# Patient Record
Sex: Female | Born: 1980 | Hispanic: Yes | Marital: Married | Smoking: Former smoker
Health system: Southern US, Community
[De-identification: ages and names within clinical notes are randomized; demographics above are authoritative.]

## PROBLEM LIST (undated history)

## (undated) ENCOUNTER — Inpatient Hospital Stay (HOSPITAL_COMMUNITY): Payer: Self-pay

## (undated) DIAGNOSIS — K649 Unspecified hemorrhoids: Secondary | ICD-10-CM

## (undated) DIAGNOSIS — Z960 Presence of urogenital implants: Secondary | ICD-10-CM

## (undated) DIAGNOSIS — I8390 Asymptomatic varicose veins of unspecified lower extremity: Secondary | ICD-10-CM

## (undated) DIAGNOSIS — F419 Anxiety disorder, unspecified: Secondary | ICD-10-CM

## (undated) DIAGNOSIS — N12 Tubulo-interstitial nephritis, not specified as acute or chronic: Secondary | ICD-10-CM

## (undated) DIAGNOSIS — O139 Gestational [pregnancy-induced] hypertension without significant proteinuria, unspecified trimester: Secondary | ICD-10-CM

## (undated) HISTORY — PX: NO PAST SURGERIES: SHX2092

## (undated) HISTORY — DX: Asymptomatic varicose veins of unspecified lower extremity: I83.90

---

## 2002-05-26 ENCOUNTER — Emergency Department (HOSPITAL_COMMUNITY): Admission: EM | Admit: 2002-05-26 | Discharge: 2002-05-27 | Payer: Self-pay | Admitting: Emergency Medicine

## 2003-08-01 ENCOUNTER — Emergency Department (HOSPITAL_COMMUNITY): Admission: EM | Admit: 2003-08-01 | Discharge: 2003-08-01 | Payer: Self-pay

## 2003-12-27 ENCOUNTER — Emergency Department (HOSPITAL_COMMUNITY): Admission: EM | Admit: 2003-12-27 | Discharge: 2003-12-28 | Payer: Self-pay | Admitting: Emergency Medicine

## 2004-09-30 ENCOUNTER — Emergency Department (HOSPITAL_COMMUNITY): Admission: EM | Admit: 2004-09-30 | Discharge: 2004-09-30 | Payer: Self-pay | Admitting: Emergency Medicine

## 2004-10-10 ENCOUNTER — Emergency Department (HOSPITAL_COMMUNITY): Admission: EM | Admit: 2004-10-10 | Discharge: 2004-10-11 | Payer: Self-pay | Admitting: Emergency Medicine

## 2006-01-04 ENCOUNTER — Inpatient Hospital Stay (HOSPITAL_COMMUNITY): Admission: AD | Admit: 2006-01-04 | Discharge: 2006-01-04 | Payer: Self-pay | Admitting: *Deleted

## 2006-02-07 ENCOUNTER — Emergency Department (HOSPITAL_COMMUNITY): Admission: EM | Admit: 2006-02-07 | Discharge: 2006-02-08 | Payer: Self-pay | Admitting: *Deleted

## 2006-02-26 ENCOUNTER — Inpatient Hospital Stay (HOSPITAL_COMMUNITY): Admission: AD | Admit: 2006-02-26 | Discharge: 2006-02-26 | Payer: Self-pay | Admitting: Gynecology

## 2006-03-25 ENCOUNTER — Inpatient Hospital Stay (HOSPITAL_COMMUNITY): Admission: AD | Admit: 2006-03-25 | Discharge: 2006-03-25 | Payer: Self-pay | Admitting: Gynecology

## 2006-05-06 ENCOUNTER — Inpatient Hospital Stay (HOSPITAL_COMMUNITY): Admission: AD | Admit: 2006-05-06 | Discharge: 2006-05-06 | Payer: Self-pay | Admitting: Gynecology

## 2006-05-23 ENCOUNTER — Inpatient Hospital Stay (HOSPITAL_COMMUNITY): Admission: AD | Admit: 2006-05-23 | Discharge: 2006-05-25 | Payer: Self-pay | Admitting: Obstetrics

## 2006-06-16 ENCOUNTER — Inpatient Hospital Stay (HOSPITAL_COMMUNITY): Admission: AD | Admit: 2006-06-16 | Discharge: 2006-06-16 | Payer: Self-pay | Admitting: Obstetrics

## 2006-07-03 ENCOUNTER — Inpatient Hospital Stay (HOSPITAL_COMMUNITY): Admission: AD | Admit: 2006-07-03 | Discharge: 2006-07-07 | Payer: Self-pay | Admitting: Obstetrics

## 2006-07-04 ENCOUNTER — Encounter (INDEPENDENT_AMBULATORY_CARE_PROVIDER_SITE_OTHER): Payer: Self-pay | Admitting: *Deleted

## 2008-08-20 ENCOUNTER — Inpatient Hospital Stay (HOSPITAL_COMMUNITY): Admission: AD | Admit: 2008-08-20 | Discharge: 2008-08-20 | Payer: Self-pay | Admitting: Obstetrics & Gynecology

## 2008-09-17 ENCOUNTER — Inpatient Hospital Stay (HOSPITAL_COMMUNITY): Admission: AD | Admit: 2008-09-17 | Discharge: 2008-09-17 | Payer: Self-pay | Admitting: Obstetrics & Gynecology

## 2009-08-22 ENCOUNTER — Emergency Department (HOSPITAL_COMMUNITY): Admission: EM | Admit: 2009-08-22 | Discharge: 2009-08-22 | Payer: Self-pay | Admitting: Emergency Medicine

## 2009-08-23 ENCOUNTER — Emergency Department (HOSPITAL_COMMUNITY): Admission: EM | Admit: 2009-08-23 | Discharge: 2009-08-23 | Payer: Self-pay | Admitting: Emergency Medicine

## 2009-08-28 ENCOUNTER — Emergency Department (HOSPITAL_COMMUNITY): Admission: EM | Admit: 2009-08-28 | Discharge: 2009-08-28 | Payer: Self-pay | Admitting: Emergency Medicine

## 2010-01-21 ENCOUNTER — Emergency Department (HOSPITAL_COMMUNITY): Admission: EM | Admit: 2010-01-21 | Discharge: 2010-01-21 | Payer: Self-pay | Admitting: Emergency Medicine

## 2010-01-28 ENCOUNTER — Emergency Department (HOSPITAL_COMMUNITY): Admission: EM | Admit: 2010-01-28 | Discharge: 2010-01-28 | Payer: Self-pay | Admitting: Emergency Medicine

## 2011-03-02 LAB — URINALYSIS, ROUTINE W REFLEX MICROSCOPIC
Glucose, UA: NEGATIVE mg/dL
Ketones, ur: 40 mg/dL — AB
Nitrite: POSITIVE — AB
Protein, ur: 30 mg/dL — AB
Specific Gravity, Urine: 1.029 (ref 1.005–1.030)
Urobilinogen, UA: 2 mg/dL — ABNORMAL HIGH (ref 0.0–1.0)
pH: 5.5 (ref 5.0–8.0)

## 2011-03-02 LAB — DIFFERENTIAL
Basophils Absolute: 0 10*3/uL (ref 0.0–0.1)
Basophils Relative: 0 % (ref 0–1)
Eosinophils Absolute: 0 10*3/uL (ref 0.0–0.7)
Eosinophils Relative: 0 % (ref 0–5)
Lymphocytes Relative: 3 % — ABNORMAL LOW (ref 12–46)
Lymphs Abs: 0.3 10*3/uL — ABNORMAL LOW (ref 0.7–4.0)
Monocytes Absolute: 0.1 10*3/uL (ref 0.1–1.0)
Monocytes Relative: 1 % — ABNORMAL LOW (ref 3–12)
Neutro Abs: 9.5 10*3/uL — ABNORMAL HIGH (ref 1.7–7.7)
Neutrophils Relative %: 96 % — ABNORMAL HIGH (ref 43–77)

## 2011-03-02 LAB — CBC
HCT: 35.6 % — ABNORMAL LOW (ref 36.0–46.0)
Hemoglobin: 12.3 g/dL (ref 12.0–15.0)
MCHC: 34.4 g/dL (ref 30.0–36.0)
MCV: 92.4 fL (ref 78.0–100.0)
Platelets: 244 10*3/uL (ref 150–400)
RBC: 3.85 MIL/uL — ABNORMAL LOW (ref 3.87–5.11)
RDW: 14.6 % (ref 11.5–15.5)
WBC: 9.9 10*3/uL (ref 4.0–10.5)

## 2011-03-02 LAB — BASIC METABOLIC PANEL
BUN: 13 mg/dL (ref 6–23)
CO2: 24 mEq/L (ref 19–32)
Calcium: 8.4 mg/dL (ref 8.4–10.5)
Chloride: 100 mEq/L (ref 96–112)
Creatinine, Ser: 0.82 mg/dL (ref 0.4–1.2)
GFR calc Af Amer: >60 mL/min (ref >60)
GFR calc non Af Amer: >60 mL/min (ref >60)
Glucose, Bld: 112 mg/dL — ABNORMAL HIGH (ref 70–99)
Potassium: 3.1 mEq/L — ABNORMAL LOW (ref 3.5–5.1)
Sodium: 136 mEq/L (ref 135–145)

## 2011-03-02 LAB — URINE MICROSCOPIC-ADD ON

## 2011-03-02 LAB — POCT I-STAT, CHEM 8
BUN: 14 mg/dL (ref 6–23)
Calcium, Ion: 1.08 mmol/L — ABNORMAL LOW (ref 1.12–1.32)
Chloride: 102 mEq/L (ref 96–112)
Creatinine, Ser: 0.7 mg/dL (ref 0.4–1.2)
Glucose, Bld: 115 mg/dL — ABNORMAL HIGH (ref 70–99)
HCT: 37 % (ref 36.0–46.0)
Hemoglobin: 12.6 g/dL (ref 12.0–15.0)
Potassium: 3.2 mEq/L — ABNORMAL LOW (ref 3.5–5.1)
Sodium: 135 mEq/L (ref 135–145)
TCO2: 26 mmol/L (ref 0–100)

## 2011-03-02 LAB — URINE CULTURE
Colony Count: NO GROWTH
Culture: NO GROWTH

## 2011-03-02 LAB — POCT PREGNANCY, URINE: Preg Test, Ur: NEGATIVE

## 2011-03-18 LAB — POCT I-STAT, CHEM 8
BUN: 6 mg/dL (ref 6–23)
Calcium, Ion: 1.14 mmol/L (ref 1.12–1.32)
Chloride: 104 mEq/L (ref 96–112)
Creatinine, Ser: 0.6 mg/dL (ref 0.4–1.2)
Glucose, Bld: 100 mg/dL — ABNORMAL HIGH (ref 70–99)
HCT: 39 % (ref 36.0–46.0)
Hemoglobin: 13.3 g/dL (ref 12.0–15.0)
Potassium: 3.4 mEq/L — ABNORMAL LOW (ref 3.5–5.1)
Sodium: 140 mEq/L (ref 135–145)
TCO2: 24 mmol/L (ref 0–100)

## 2011-09-12 LAB — POCT PREGNANCY, URINE: Preg Test, Ur: NEGATIVE

## 2011-09-14 LAB — URINALYSIS, ROUTINE W REFLEX MICROSCOPIC
Leukocytes, UA: NEGATIVE
Nitrite: NEGATIVE
Specific Gravity, Urine: <1.005 — ABNORMAL LOW
Urobilinogen, UA: 0.2

## 2011-09-14 LAB — URINE MICROSCOPIC-ADD ON

## 2011-09-14 LAB — CBC
HCT: 35.9 — ABNORMAL LOW
Hemoglobin: 11.9 — ABNORMAL LOW
MCV: 90.7
RBC: 3.96
WBC: 7.6

## 2011-09-14 LAB — WET PREP, GENITAL: Clue Cells Wet Prep HPF POC: NONE SEEN

## 2011-09-14 LAB — POCT PREGNANCY, URINE: Preg Test, Ur: NEGATIVE

## 2011-12-13 NOTE — L&D Delivery Note (Signed)
Delivery Note At 4:57 PM a viable female was delivered via Vaginal, Spontaneous Delivery (Presentation: Right Occiput Anterior).  APGAR: 9, 9; weight .   Placenta status: Intact, Spontaneous.  Cord: 3 vessels with the following complications: None.  Cord pH: none  Anesthesia: None  Episiotomy: None Lacerations: None Suture Repair: none Est. Blood Loss (mL): 250  Mom to postpartum.  Baby to nursery-stable.  HARPER,CHARLES A 08/17/2012, 5:12 PM

## 2012-01-07 ENCOUNTER — Inpatient Hospital Stay (HOSPITAL_COMMUNITY)
Admission: AD | Admit: 2012-01-07 | Discharge: 2012-01-07 | Disposition: A | Discharge status: Home / Self Care | Payer: Self-pay | Source: Ambulatory Visit | Attending: Obstetrics & Gynecology | Admitting: Obstetrics & Gynecology

## 2012-01-07 ENCOUNTER — Encounter (HOSPITAL_COMMUNITY): Payer: Self-pay

## 2012-01-07 ENCOUNTER — Inpatient Hospital Stay (HOSPITAL_COMMUNITY): Payer: Self-pay

## 2012-01-07 DIAGNOSIS — A499 Bacterial infection, unspecified: Secondary | ICD-10-CM | POA: Insufficient documentation

## 2012-01-07 DIAGNOSIS — N760 Acute vaginitis: Secondary | ICD-10-CM | POA: Insufficient documentation

## 2012-01-07 DIAGNOSIS — R109 Unspecified abdominal pain: Secondary | ICD-10-CM | POA: Insufficient documentation

## 2012-01-07 DIAGNOSIS — B9689 Other specified bacterial agents as the cause of diseases classified elsewhere: Secondary | ICD-10-CM | POA: Insufficient documentation

## 2012-01-07 DIAGNOSIS — O2390 Unspecified genitourinary tract infection in pregnancy, unspecified trimester: Secondary | ICD-10-CM | POA: Insufficient documentation

## 2012-01-07 LAB — CBC
HCT: 35.4 % — ABNORMAL LOW (ref 36.0–46.0)
Hemoglobin: 12.3 g/dL (ref 12.0–15.0)
MCH: 30.5 pg (ref 26.0–34.0)
MCHC: 34.7 g/dL (ref 30.0–36.0)
MCV: 87.8 fL (ref 78.0–100.0)
Platelets: 293 K/uL (ref 150–400)
RBC: 4.03 MIL/uL (ref 3.87–5.11)
RDW: 12.7 % (ref 11.5–15.5)
WBC: 8.5 K/uL (ref 4.0–10.5)

## 2012-01-07 LAB — URINALYSIS, ROUTINE W REFLEX MICROSCOPIC
Bilirubin Urine: NEGATIVE
Glucose, UA: NEGATIVE mg/dL
Ketones, ur: NEGATIVE mg/dL
Specific Gravity, Urine: <1.005 — ABNORMAL LOW (ref 1.005–1.030)
pH: 5.5 (ref 5.0–8.0)

## 2012-01-07 LAB — WET PREP, GENITAL
Trich, Wet Prep: NONE SEEN
Yeast Wet Prep HPF POC: NONE SEEN

## 2012-01-07 LAB — URINE MICROSCOPIC-ADD ON

## 2012-01-07 LAB — ABO/RH: ABO/RH(D): O POS

## 2012-01-07 MED ORDER — METRONIDAZOLE 500 MG PO TABS: 500.0000 mg | ORAL_TABLET | Freq: Three times a day (TID) | ORAL | Status: AC

## 2012-01-07 MED ORDER — PROMETHAZINE HCL 12.5 MG PO TABS: 12.5000 mg | ORAL_TABLET | Freq: Four times a day (QID) | ORAL | Status: DC | PRN

## 2012-01-07 NOTE — ED Provider Notes (Signed)
History    G2P1 presents at [redacted]w[redacted]d by LMP for vaginal spotting, cramping, and n/v x1 day. She does not yet have a provider for prenatal care.  Chief Complaint  Patient presents with  . Vaginal Bleeding  . Abdominal Cramping  . Emesis   HPI  OB History    Grav Para Term Preterm Abortions TAB SAB Ect Mult Living   2 1 1       1       History reviewed. No pertinent past medical history.  History reviewed. No pertinent past surgical history.  History reviewed. No pertinent family history.  History  Substance Use Topics  . Smoking status: Never Smoker   . Smokeless tobacco: Not on file  . Alcohol Use: No    Allergies:  Allergies  Allergen Reactions  . Iohexol      Code: SOB, Desc: IMMEDIATELY SOB FOLLOWING IV INJECTION, SPOTTY HIVES, PT GIVEN BENADRYL AND EPI-ARS 08/22/09, Onset Date: [id number]     Prescriptions prior to admission  Medication Sig Dispense Refill  . Prenatal Vit-Fe Fumarate-FA (PRENATAL MULTIVITAMIN) TABS Take 1 tablet by mouth daily.        Review of Systems  Gastrointestinal: Positive for nausea and abdominal pain.   Physical Exam   Blood pressure 126/71, pulse 77, temperature 97.5 F (36.4 C), temperature source Oral, resp. rate 18, height 5\' 4"  (1.626 m), weight 54.205 kg (119 lb 8 oz), last menstrual period 12/02/2011, SpO2 99.00%.  Physical Exam  Constitutional: She is oriented to person, place, and time.  GI: Soft.  Genitourinary:       Cervix pink with redness noted at cervical os.  No active bleeding noted.  Scant white vaginal discharge.  Cervix 0/lng/hi  Neurological: She is alert and oriented to person, place, and time.  Skin: Skin is warm and dry.  Psychiatric: She has a normal mood and affect. Her behavior is normal. Judgment and thought content normal.   Results for orders placed during the hospital encounter of 01/07/12 (from the past 24 hour(s))  URINALYSIS, ROUTINE W REFLEX MICROSCOPIC     Status: Abnormal   Collection Time   01/07/12  8:30 PM      Component Value Range   Color, Urine YELLOW  YELLOW    APPearance CLEAR  CLEAR    Specific Gravity, Urine <1.005 (*) 1.005 - 1.030    pH 5.5  5.0 - 8.0    Glucose, UA NEGATIVE  NEGATIVE (mg/dL)   Hgb urine dipstick TRACE (*) NEGATIVE    Bilirubin Urine NEGATIVE  NEGATIVE    Ketones, ur NEGATIVE  NEGATIVE (mg/dL)   Protein, ur NEGATIVE  NEGATIVE (mg/dL)   Urobilinogen, UA 0.2  0.0 - 1.0 (mg/dL)   Nitrite NEGATIVE  NEGATIVE    Leukocytes, UA NEGATIVE  NEGATIVE   URINE MICROSCOPIC-ADD ON     Status: Normal   Collection Time   01/07/12  8:30 PM      Component Value Range   Squamous Epithelial / LPF RARE  RARE    WBC, UA 0-2  <3 (WBC/hpf)   RBC / HPF 0-2  <3 (RBC/hpf)  WET PREP, GENITAL     Status: Abnormal   Collection Time   01/07/12  8:50 PM      Component Value Range   Yeast, Wet Prep NONE SEEN  NONE SEEN    Trich, Wet Prep NONE SEEN  NONE SEEN    Clue Cells, Wet Prep MODERATE (*) NONE SEEN    WBC, Wet  Prep HPF POC MODERATE (*) NONE SEEN   POCT PREGNANCY, URINE     Status: Abnormal   Collection Time   01/07/12  8:56 PM      Component Value Range   Preg Test, Ur POSITIVE (*) NEGATIVE   HCG, QUANTITATIVE, PREGNANCY     Status: Abnormal   Collection Time   01/07/12  9:52 PM      Component Value Range   hCG, Beta Chain, Quant, S 8590 (*) <5 (mIU/mL)  CBC     Status: Abnormal   Collection Time   01/07/12  9:55 PM      Component Value Range   WBC 8.5  4.0 - 10.5 (K/uL)   RBC 4.03  3.87 - 5.11 (MIL/uL)   Hemoglobin 12.3  12.0 - 15.0 (g/dL)   HCT 16.1 (*) 09.6 - 46.0 (%)   MCV 87.8  78.0 - 100.0 (fL)   MCH 30.5  26.0 - 34.0 (pg)   MCHC 34.7  30.0 - 36.0 (g/dL)   RDW 04.5  40.9 - 81.1 (%)   Platelets 293  150 - 400 (K/uL)  ABO/RH     Status: Normal   Collection Time   01/07/12  9:56 PM      Component Value Range   ABO/RH(D) O POS     US Ob Comp Less 14 Wks Results indicate IUGS at 5.5 weeks, which is consistent with LMP.  No yolk sac or fetal pole  visualized.    MAU Course  Procedures U/A U/S  Quantitative hCG CBC ABO/Rh   Assessment and Plan  A: Bacterial vaginosis Early IUP  P: D/C home with bleeding precautions Prescription for Flagyl BID for 7 days Return to MAU for [facility] in 48 hours List of providers given for pt to set up f/u prenatal care  LEFTWICH-KIRBY, LISA 01/07/2012, 9:40 PM

## 2012-01-07 NOTE — Progress Notes (Signed)
Patient is here with c/o vaginal bleeding, llq cramping and vomiting that started today. The pad she came in with had a brown streak in the middle of pad. She states that she had a positive home pregnancy test 4 days ago.

## 2012-01-12 NOTE — ED Provider Notes (Signed)
Attestation of Attending Supervision of Advanced Practitioner: Evaluation and management procedures were performed by the PA/NP/CNM/OB Fellow under my supervision/collaboration. Chart reviewed, and agree with management and plan.  Jaynie Collins, M.D. 01/12/2012 9:02 AM

## 2012-01-28 ENCOUNTER — Inpatient Hospital Stay (HOSPITAL_COMMUNITY)
Admission: AD | Admit: 2012-01-28 | Discharge: 2012-01-29 | Disposition: A | Discharge status: Home / Self Care | Payer: Self-pay | Source: Ambulatory Visit | Attending: Obstetrics & Gynecology | Admitting: Obstetrics & Gynecology

## 2012-01-28 DIAGNOSIS — O200 Threatened abortion: Secondary | ICD-10-CM | POA: Insufficient documentation

## 2012-01-28 LAB — URINALYSIS, ROUTINE W REFLEX MICROSCOPIC
Glucose, UA: NEGATIVE mg/dL
Ketones, ur: NEGATIVE mg/dL
Leukocytes, UA: NEGATIVE
Nitrite: NEGATIVE
Specific Gravity, Urine: >1.03 — ABNORMAL HIGH (ref 1.005–1.030)
pH: 5.5 (ref 5.0–8.0)

## 2012-01-28 LAB — URINE MICROSCOPIC-ADD ON

## 2012-01-28 NOTE — Progress Notes (Signed)
Pt states, " I started having chills today at 1:30 pm today and then at 8:30 pm I started having cramping in my low abdomen and it goes down my legs, and I started seeing drops of blood when I urinate and latter started seeing small clots."

## 2012-01-29 ENCOUNTER — Inpatient Hospital Stay (HOSPITAL_COMMUNITY): Payer: Self-pay

## 2012-01-29 ENCOUNTER — Encounter (HOSPITAL_COMMUNITY): Payer: Self-pay | Admitting: Obstetrics and Gynecology

## 2012-01-29 NOTE — ED Provider Notes (Signed)
History     Chief Complaint  Patient presents with  . Vaginal Bleeding  . Abdominal Pain  . Chills   HPI  "I noticed bleeding at about 2030 01/28/12. Then I started cramping about 2200. I feel weakness in my legs when I try to walk (x 1 hour)."  Previously seen in MAU on 01/07/12.  Wet prep - mod clue>treated; GC/CT neg x2; ultrasound > no FHR seen.  Blood type O+.   History reviewed. No pertinent past medical history.  History reviewed. No pertinent past surgical history.  History reviewed. No pertinent family history.  History  Substance Use Topics  . Smoking status: Never Smoker   . Smokeless tobacco: Not on file  . Alcohol Use: No    Allergies:  Allergies  Allergen Reactions  . Iohexol      Code: SOB, Desc: IMMEDIATELY SOB FOLLOWING IV INJECTION, SPOTTY HIVES, PT GIVEN BENADRYL AND EPI-ARS 08/22/09, Onset Date: [id number]     Prescriptions prior to admission  Medication Sig Dispense Refill  . Prenatal Vit-Fe Fumarate-FA (PRENATAL MULTIVITAMIN) TABS Take 1 tablet by mouth daily.        Review of Systems  Gastrointestinal: Positive for abdominal pain (cramping).  Genitourinary:       Vaginal bleeding  All other systems reviewed and are negative.   Physical Exam   Blood pressure 97/59, pulse 71, temperature 98 F (36.7 C), temperature source Oral, resp. rate 16, height 5\' 3"  (1.6 m), weight 54.545 kg (120 lb 4 oz), last menstrual period 12/02/2011.  Physical Exam  Constitutional: She is oriented to person, place, and time. She appears well-developed and well-nourished. No distress.  HENT:  Head: Normocephalic.  Neck: Normal range of motion. Neck supple.  Cardiovascular: Normal rate, regular rhythm and normal heart sounds.   Respiratory: Effort normal and breath sounds normal. No respiratory distress.  GI: Soft. She exhibits no mass. There is no tenderness. There is no rebound and no guarding.  Genitourinary: There is bleeding ( scant) around the vagina.    Neurological: She is alert and oriented to person, place, and time.  Skin: Skin is warm and dry.    MAU Course  Procedures  Results for orders placed during the hospital encounter of 01/28/12 (from the past 24 hour(s))  URINALYSIS, ROUTINE W REFLEX MICROSCOPIC     Status: Abnormal   Collection Time   01/28/12 11:39 PM      Component Value Range   Color, Urine YELLOW  YELLOW    APPearance CLEAR  CLEAR    Specific Gravity, Urine >1.030 (*) 1.005 - 1.030    pH 5.5  5.0 - 8.0    Glucose, UA NEGATIVE  NEGATIVE (mg/dL)   Hgb urine dipstick LARGE (*) NEGATIVE    Bilirubin Urine NEGATIVE  NEGATIVE    Ketones, ur NEGATIVE  NEGATIVE (mg/dL)   Protein, ur NEGATIVE  NEGATIVE (mg/dL)   Urobilinogen, UA 0.2  0.0 - 1.0 (mg/dL)   Nitrite NEGATIVE  NEGATIVE    Leukocytes, UA NEGATIVE  NEGATIVE   URINE MICROSCOPIC-ADD ON     Status: Abnormal   Collection Time   01/28/12 11:39 PM      Component Value Range   Squamous Epithelial / LPF FEW (*) RARE    WBC, UA 3-6  <3 (WBC/hpf)   RBC / HPF 7-10  <3 (RBC/hpf)   Bacteria, UA FEW (*) RARE     Ultrasound: IMPRESSION:  Single live intrauterine pregnancy, with a crown-rump length of 2.0  cm,  corresponding to a gestational age of [redacted] weeks 5 days. This  matches the gestational age of [redacted] weeks 2 days by LMP, reflecting an  estimated date of delivery of September 07, 2012.   Assessment and Plan  Threatened Miscarriage  Plan: DC to home Bleeding precautions given Keep appointment with Health Department for next week [facility]   [facility] 01/29/2012, 1:22 AM

## 2012-01-29 NOTE — Progress Notes (Signed)
"  I noticed bleeding at about 2030 01/28/12.  Then I started cramping about 2200.  I feel weakness in my legs when I try to walk (x 1 hour)."

## 2012-01-29 NOTE — ED Provider Notes (Signed)
Attestation of Attending Supervision of Advanced Practitioner: Evaluation and management procedures were performed by the PA/NP/CNM/OB Fellow under my supervision/collaboration. Chart reviewed, and agree with management and plan.  Jaynie Collins, M.D. 01/29/2012 7:09 AM

## 2012-01-29 NOTE — Discharge Instructions (Signed)
Amenaza de aborto (Threatened Miscarriage) La hemorragia en las primeras 20 semanas de embarazo es algo frecuente. Se denomina amenaza de aborto Es un problema del embarazo que ocurre antes de la vigsima semana y que sugiere la probabilidad de que ocurra un aborto espontneo. Generalmente esta hemorragia se detiene con reposo o con disminucin de las actividades, segn le ha sugerido el profesional que la [place], y Firefighter contina sin [employer]. Le indicarn que no [employer], no tenga orgasmos ni use tampones hasta que la autoricen. En algunos casos la amenaza de aborto progresar hasta el aborto completo o incompleto. En algunos casos puede ser necesario un tratamiento adicional, en otros casos no. Algunos abortos ocurren antes que la mujer note que no ha [employer] y de que sepa que est embarazada. Un aborto ocurre en el 15% al 20% de todos los embarazos y generalmente durante las primeras 13 semanas. En la [employer] se desconoce la causa exacta. Es Biochemist, clinical en que la naturaliza pone fin a un embarazo anormal o que no llegar a trmino. Algunas de las cosas que ponen en riesgo el embarazo son:  Los cambios hormonales.   Infeccin o tumores en el tero.   Enfermedades crnicas, por ejemplo la diabetes, especialmente si no se ha controlado.   Forma anormal del tero.   Fibromas en el tero   Crvix incompetente (es demasiado dbil para contener al beb)   El consumo de cigarrillos.   Beber alcohol en exceso. Lo mejor es abstenerse de beber alcohol durante el embarazo.   El consumo de drogas  TRATAMIENTO No es necesario Education officer, environmental un tratamiento adicional cuando el aborto es completo y todos los componentes de la concepcin (todos los tejidos) se han eliminado. Si ha eliminado tejidos, consrvelos en un recipiente y llvelos al mdico para que los evale. Si el aborto es incompleto (partes del feto o de la placenta [place]) ser necesario un  tratamiento adicional. La razn ms frecuente para Education officer, environmental un tratamiento es el sangrado (hemorragia) continuo debido a que los tejidos no se han eliminado completamente. Esto sucede cuando el aborto es incompleto. Tambin puede [employer] tejidos que no se han expulsado se infecten. El tratamiento consiste en la dilatacin y Scientific laboratory technician (remocin de los productos del embarazo que pudieran quedar en el tero). Se realizar simplemente por medio de la succin (curetaje por succin) o por un raspado simple del interior del tero. Podr llevarse a cabo en el hospital o en el consultorio del profesional. Slo se lleva a cabo cuando el profesional se asegura que no hay posibilidades de que el embarazo llegue a trmino. Esto se determina por medio del examen fsico, la prueba de Psychiatrist negativa, el recuento hormonal y el ultrasonido que confirme la muerte del feto. El aborto generalmente es una situacin emocionalmente difcil para los [place]. No es por su culpa ni la de su pareja. No ocurre por conductas inapropiadas por parte suya o de su compaero. Casi todos los abortos se producen porque el embarazo ha comenzado de un modo incorrecto. Al menos la mitad de estos embarazos presenta anormalidades cromosmicas. Casi nunca se trata de un trastorno congnito. En otros puede haber problemas de desarrollo en el feto o en la placenta. Esto no siempre se revela, an cuando se estudien los productos del aborto con el microscopio. No se sienta culpable y probablemente no podra haber evitado que esto ocurra. Si tiene [employer] debido a este problema, convrselo con Mining engineer  y solicite ayuda psicolgico antes de un nuevo embarazo. Casi siempre puede tratar de embarazarse nuevamente tan pronto el profesional la autorice. INSTRUCCIONES PARA EL CUIDADO DOMICILIARIO  El Economist reposo, segn la importancia de la hemorragia y los dolores que [place]. Probablemente slo la autorice a  levantarse para ir al bao. Tambin podr autorizarla a Building surveyor. En este momento usted Economist algunos arreglos para que otra persona se ocupe del cuidado de los nios y de otras responsabilidades adicionales.   Lleve un registro de la cantidad y la saturacin de las toallas higinicas que Landscape architect. Anote esta informacin.   NO USE TAMPONES. No se haga duchas vaginales ni tenga relaciones sexuales u orgasmos hasta que el mdico la autorice.   Puede ser que le indiquen una cita para un seguimiento en el que volvern a Development worker, community el [place] de su Psychiatrist y Chief Executive Officer repetirn las pruebas de [place]. Concurra para una nueva evaluacin dentro de 2 das y [place] de 4 a 6 semanas. Es muy importante que realice el seguimiento en el momento que le han indicado.   Si su grupo sanguneo es Rh negativo y el del padre es Rh positivo, o si no conoce el grupo sanguneo del padre, le indicarn una inyeccin (inmunoglobulina Rh) para prevenir los anticuerpos anormales que pueden desarrollarse y Audiological scientist al beb en futuros embarazos.  SOLICITE ATENCIN MDICA DE INMEDIATO SI:  Siente calambres intensos en el estmago, en la espalda o en el abdomen.   Siente un dolor intenso de comienzo sbito en la zona inferior del abdomen.   Comienza a sentir escalofros.   La temperatura se eleva por encima de 101 F (38.3 C).   Elimina cogulos o tejidos grandes. Guarde una muestra de esos tejidos para que el profesional lo inspeccione.   La hemorragia aumenta o se siente mareada, dbil o tiene episodios de desmayos.   Tiene una prdida de lquido por la vagina.   Se desmaya. No puede mover el intestino. Podra tratarse de un embarazo ectpico.  Document Released: 09/07/2005 Document Revised: 08/10/2011 [facility] Patient Information 2012 [place], [place].

## 2012-01-31 ENCOUNTER — Encounter (HOSPITAL_COMMUNITY): Payer: Self-pay | Admitting: *Deleted

## 2012-01-31 ENCOUNTER — Inpatient Hospital Stay (HOSPITAL_COMMUNITY)
Admission: AD | Admit: 2012-01-31 | Discharge: 2012-02-01 | Disposition: A | Discharge status: Home / Self Care | Payer: Self-pay | Source: Ambulatory Visit | Attending: Obstetrics & Gynecology | Admitting: Obstetrics & Gynecology

## 2012-01-31 ENCOUNTER — Inpatient Hospital Stay (HOSPITAL_COMMUNITY): Payer: Self-pay

## 2012-01-31 DIAGNOSIS — O99891 Other specified diseases and conditions complicating pregnancy: Secondary | ICD-10-CM | POA: Insufficient documentation

## 2012-01-31 DIAGNOSIS — R10813 Right lower quadrant abdominal tenderness: Secondary | ICD-10-CM | POA: Insufficient documentation

## 2012-01-31 DIAGNOSIS — R109 Unspecified abdominal pain: Secondary | ICD-10-CM | POA: Insufficient documentation

## 2012-01-31 DIAGNOSIS — O26899 Other specified pregnancy related conditions, unspecified trimester: Secondary | ICD-10-CM

## 2012-01-31 DIAGNOSIS — N949 Unspecified condition associated with female genital organs and menstrual cycle: Secondary | ICD-10-CM | POA: Insufficient documentation

## 2012-01-31 LAB — URINALYSIS, ROUTINE W REFLEX MICROSCOPIC
Bilirubin Urine: NEGATIVE
Ketones, ur: NEGATIVE mg/dL
Nitrite: NEGATIVE
Protein, ur: NEGATIVE mg/dL
Specific Gravity, Urine: 1.01 (ref 1.005–1.030)
Urobilinogen, UA: 0.2 mg/dL (ref 0.0–1.0)

## 2012-01-31 LAB — CBC
MCH: 30.5 pg (ref 26.0–34.0)
MCV: 87.7 fL (ref 78.0–100.0)
Platelets: 302 10*3/uL (ref 150–400)
RBC: 3.83 MIL/uL — ABNORMAL LOW (ref 3.87–5.11)
RDW: 12.8 % (ref 11.5–15.5)

## 2012-01-31 LAB — URINE MICROSCOPIC-ADD ON

## 2012-01-31 MED ORDER — OXYCODONE-ACETAMINOPHEN 5-325 MG PO TABS: 1.0000 | ORAL_TABLET | ORAL | Status: AC | PRN

## 2012-01-31 MED ORDER — HYDROMORPHONE HCL PF 1 MG/ML IJ SOLN
1.0000 mg | Freq: Once | INTRAMUSCULAR | Status: AC
  Administered 2012-01-31: 1 mg via INTRAMUSCULAR
  Filled 2012-01-31: qty 1

## 2012-01-31 NOTE — ED Provider Notes (Signed)
History     Chief Complaint  Patient presents with  . Abdominal Pain   HPI 31 y.o. G2P1001 at [redacted]w[redacted]d c/o low abd pain x 3 hours, no bleeding or discharge.    History reviewed. No pertinent past medical history.  History reviewed. No pertinent past surgical history.  Family History  Problem Relation Age of Onset  . Anesthesia problems Neg Hx   . Hypotension Neg Hx   . Malignant hyperthermia Neg Hx   . Pseudochol deficiency Neg Hx     History  Substance Use Topics  . Smoking status: Never Smoker   . Smokeless tobacco: Not on file  . Alcohol Use: No    Allergies:  Allergies  Allergen Reactions  . Iohexol      Code: SOB, Desc: IMMEDIATELY SOB FOLLOWING IV INJECTION, SPOTTY HIVES, PT GIVEN BENADRYL AND EPI-ARS 08/22/09, Onset Date: [id number]     Prescriptions prior to admission  Medication Sig Dispense Refill  . Prenatal Vit-Fe Fumarate-FA (PRENATAL MULTIVITAMIN) TABS Take 1 tablet by mouth daily.        Review of Systems  Constitutional: Negative.   Respiratory: Negative.   Cardiovascular: Negative.   Gastrointestinal: Positive for abdominal pain. Negative for nausea, vomiting, diarrhea and constipation.  Genitourinary: Negative for dysuria, urgency, frequency, hematuria and flank pain.       Negative for vaginal bleeding, vaginal discharge  Musculoskeletal: Negative.   Neurological: Negative.   Psychiatric/Behavioral: Negative.    Physical Exam   Blood pressure 130/76, pulse 78, temperature 98 F (36.7 C), resp. rate 18, height 5\' 3"  (1.6 m), weight 121 lb (54.885 kg), last menstrual period 12/02/2011.  Physical Exam  Nursing note and vitals reviewed. Constitutional: She is oriented to person, place, and time. She appears well-developed and well-nourished. She appears distressed (pacing).  Cardiovascular: Normal rate.   Respiratory: Effort normal.  GI: Soft. She exhibits no distension and no mass. There is tenderness (RLQ). There is no rebound and no guarding.   Genitourinary: Uterus is enlarged (c/w dates). Uterus is not tender. Cervix exhibits no motion tenderness. Right adnexum displays tenderness. Right adnexum displays no mass and no fullness. Left adnexum displays no mass, no tenderness and no fullness.  Musculoskeletal: Normal range of motion.  Neurological: She is alert and oriented to person, place, and time.  Skin: Skin is warm and dry.  Psychiatric: She has a normal mood and affect.    MAU Course  Procedures  Results for orders placed during the hospital encounter of 01/31/12 (from the past 24 hour(s))  URINALYSIS, ROUTINE W REFLEX MICROSCOPIC     Status: Abnormal   Collection Time   01/31/12  8:55 PM      Component Value Range   Color, Urine YELLOW  YELLOW    APPearance CLEAR  CLEAR    Specific Gravity, Urine 1.010  1.005 - 1.030    pH 6.0  5.0 - 8.0    Glucose, UA NEGATIVE  NEGATIVE (mg/dL)   Hgb urine dipstick TRACE (*) NEGATIVE    Bilirubin Urine NEGATIVE  NEGATIVE    Ketones, ur NEGATIVE  NEGATIVE (mg/dL)   Protein, ur NEGATIVE  NEGATIVE (mg/dL)   Urobilinogen, UA 0.2  0.0 - 1.0 (mg/dL)   Nitrite NEGATIVE  NEGATIVE    Leukocytes, UA NEGATIVE  NEGATIVE   URINE MICROSCOPIC-ADD ON     Status: Normal   Collection Time   01/31/12  8:55 PM      Component Value Range   Squamous Epithelial / LPF RARE  RARE    WBC, UA 0-2  <3 (WBC/hpf)   RBC / HPF 3-6  <3 (RBC/hpf)  CBC     Status: Abnormal   Collection Time   01/31/12  9:55 PM      Component Value Range   WBC 8.1  4.0 - 10.5 (K/uL)   RBC 3.83 (*) 3.87 - 5.11 (MIL/uL)   Hemoglobin 11.7 (*) 12.0 - 15.0 (g/dL)   HCT 08.6 (*) 57.8 - 46.0 (%)   MCV 87.7  78.0 - 100.0 (fL)   MCH 30.5  26.0 - 34.0 (pg)   MCHC 34.8  30.0 - 36.0 (g/dL)   RDW 46.9  62.9 - 52.8 (%)   Platelets 302  150 - 400 (K/uL)   US Ob Comp Less 14 Wks  01/07/2012  OBSTETRICAL ULTRASOUND: This exam was performed within a [facility] [facility]. The OB US report was generated in the AS system, and  faxed to the ordering physician.   This report is also available in [employer] and in the [employer]. See AS Obstetric US report.   US Ob Transvaginal  01/31/2012  *RADIOLOGY REPORT*  Clinical Data: Pelvic pain.  Clinical concern for ovarian torsion. 9 weeks and 0 days pregnant by previous ultrasound.  TRANSVAGINAL OBSTETRIC US WITH ARTERIAL AND VENOUS DOPPLER  Technique:  Transvaginal ultrasound was performed for complete evaluation of the gestation as well as the maternal uterus, adnexal regions, and pelvic cul-de-sac.  Color flow and duplex Doppler evaluation of the ovaries was performed.  Comparison:  01/29/2012.  Intrauterine gestational sac: Visualized. Yolk sac: Visualized. Embryo: Visualized. Cardiac Activity: Visualized. Heart Rate: 162 beats per minute Subchorionic hemorrhage: None.  Measurements were not repeated today.  Maternal uterus/adnexae: The ovaries have a normal appearance with a corpus luteum cyst noted on the right.  Normal arterial and venous blood flow within both ovaries with color Doppler and duplex Doppler.  No free peritoneal fluid.  IMPRESSION:  1.  Single live intrauterine gestation without complicating features. 2.  Normal ovaries without torsion.  Original Report Authenticated By: Darrol Angel, M.D.   US Ob Transvaginal  01/29/2012  *RADIOLOGY REPORT*  Clinical Data: Follow-up intrauterine pregnancy; assess viability.  TRANSVAGINAL OBSTETRIC US  Technique:  Transvaginal ultrasound was performed for complete evaluation of the gestation as well as the maternal uterus, adnexal regions, and pelvic cul-de-sac.  Comparison:  None.  Intrauterine gestational sac: Visualized; normal in shape. Yolk sac: Yes Embryo: Yes Cardiac Activity: Yes Heart Rate: 161 bpm  CRL: 2.0 cm           8   w  5   d        [place] EDC: 09/04/2012  Subchorionic hemorrhage: No subchorionic hemorrhage is noted.  Maternal uterus/adnexae: The uterus is unremarkable in appearance.  The ovaries  are within normal limits.  The right ovary measures 3.3 x 2.7 x 2.5 cm, while the left ovary measures 3.1 x 1.1 x 2.4 cm. No suspicious adnexal masses are seen; there is no evidence for ovarian torsion.  No free fluid is seen in the pelvic cul-de-sac.  IMPRESSION: Single live intrauterine pregnancy, with a crown-rump length of 2.0 cm, corresponding to a gestational age of [redacted] weeks 5 days.  This matches the gestational age of [redacted] weeks 2 days by LMP, reflecting an estimated date of delivery of September 07, 2012.  Original Report Authenticated By: Tonia Ghent, M.D.   US Ob Transvaginal  01/07/2012  OBSTETRICAL ULTRASOUND: This exam was  performed within a [place] [facility] Department. The OB US report was generated in the AS system, and faxed to the ordering physician.   This report is also available in [employer] and in the [employer]. See AS Obstetric US report.   [place] Art/ven Flow Abd Pelv Doppler  01/31/2012  *RADIOLOGY REPORT*  Clinical Data: Pelvic pain.  Clinical concern for ovarian torsion. 9 weeks and 0 days pregnant by previous ultrasound.  TRANSVAGINAL OBSTETRIC US WITH ARTERIAL AND VENOUS DOPPLER  Technique:  Transvaginal ultrasound was performed for complete evaluation of the gestation as well as the maternal uterus, adnexal regions, and pelvic cul-de-sac.  Color flow and duplex Doppler evaluation of the ovaries was performed.  Comparison:  01/29/2012.  Intrauterine gestational sac: Visualized. Yolk sac: Visualized. Embryo: Visualized. Cardiac Activity: Visualized. Heart Rate: 162 beats per minute Subchorionic hemorrhage: None.  Measurements were not repeated today.  Maternal uterus/adnexae: The ovaries have a normal appearance with a corpus luteum cyst noted on the right.  Normal arterial and venous blood flow within both ovaries with color Doppler and duplex Doppler.  No free peritoneal fluid.  IMPRESSION:  1.  Single live intrauterine gestation without complicating  features. 2.  Normal ovaries without torsion.  Original Report Authenticated By: Darrol Angel, M.D.   Pain resolved with dilaudid 1 mg IM  Assessment and Plan  30 y.o. G2P1001 at [redacted]w[redacted]d Right adnexal pain - ? D/t corpus luteum cyst Rx percocet F/U for prenatal care  Taron Conrey 01/31/2012, 9:55 PM

## 2012-01-31 NOTE — Progress Notes (Signed)
abd pain x 3 hours, about [redacted] weeks pregnant. Denies bleeding.

## 2012-01-31 NOTE — ED Notes (Signed)
  Side rails up for safety. Pt informed to not get up unless assisted.

## 2012-02-01 LAB — URINE CULTURE
Colony Count: NO GROWTH
Culture  Setup Time: 201302200139
Culture: NO GROWTH

## 2012-02-16 ENCOUNTER — Inpatient Hospital Stay (HOSPITAL_COMMUNITY): Payer: Self-pay

## 2012-02-16 ENCOUNTER — Inpatient Hospital Stay (HOSPITAL_COMMUNITY)
Admission: AD | Admit: 2012-02-16 | Discharge: 2012-02-16 | Disposition: A | Discharge status: Home / Self Care | Payer: Self-pay | Source: Ambulatory Visit | Attending: Obstetrics | Admitting: Obstetrics

## 2012-02-16 ENCOUNTER — Encounter (HOSPITAL_COMMUNITY): Payer: Self-pay | Admitting: *Deleted

## 2012-02-16 DIAGNOSIS — O209 Hemorrhage in early pregnancy, unspecified: Secondary | ICD-10-CM | POA: Insufficient documentation

## 2012-02-16 LAB — CBC
HCT: 33.5 % — ABNORMAL LOW (ref 36.0–46.0)
MCHC: 34.6 g/dL (ref 30.0–36.0)
Platelets: 290 10*3/uL (ref 150–400)
RDW: 13.3 % (ref 11.5–15.5)
WBC: 8.9 10*3/uL (ref 4.0–10.5)

## 2012-02-16 LAB — WET PREP, GENITAL
Trich, Wet Prep: NONE SEEN
Yeast Wet Prep HPF POC: NONE SEEN

## 2012-02-16 MED ORDER — METRONIDAZOLE 500 MG PO TABS: 500.0000 mg | ORAL_TABLET | Freq: Two times a day (BID) | ORAL | Status: AC

## 2012-02-16 NOTE — Progress Notes (Signed)
Patient states she started bleeding at 1700, was heavy and dark to bright red. No pain. Pad patient is wearing has a small amount of pink bleeding.

## 2012-02-16 NOTE — Discharge Instructions (Signed)
Hemorragia vaginal durante el embarazo, primer trimestre (Vaginal Bleeding During Pregnancy, First Trimester) Durante el embarazo es relativamente frecuente que se presente una pequea hemorragia (manchas). Esta situacin generalmente mejora por s misma. Existen muchas causas para la hemorragia o prdidas durante el principio del embarazo. Algunas hemorragias pueden estar relacionadas al embarazo y otras no. Si aparecen retortijones con la hemorragia es ms serio y preocupante. Informe al mdico si tiene cualquier tipo de hemorragia vaginal.  CAUSAS  En la mayora de los casos es normal.   El embarazo finaliza (aborto espontneo).   El embarazo podra terminar (amenaza de aborto espontneo).   Infeccin o inflamacin del tero.   Tumores (plipos) en el tero.   El embarazo sucede por fuera del tero y en las trompas de falopio (embarazo ectpico).   Muchos quistes pequeos en el tero en lugar del tejido del embarazo (embarazo molar).  SNTOMAS Hemorragia o goteo vaginal con o sin retortijones. DIAGNSTICO Para evaluar el embarazo, el mdico podr:  Realizar un examen plvico.   Tomar anlisis de sangre.   Realizar una prueba de ultrasonido.  Es importante seguir las indicaciones del profesional que la asiste.  TRATAMIENTO  Evaluacin del embarazo con anlisis de sangre y ultrasonido.   Reposo en cama (slo levantarse para ir al bao).   Inmunizacin con Rho-gam si la madre es Rh negativo y el padre es Rh positivo.  INSTRUCCIONES PARA EL CUIDADO DOMICILIARIO  Si el mdico le indica reposo en cama, usted necesitar hacer algunos arreglos para que otra persona se ocupe del cuidado de los nios y de otras responsabilidades adicionales. Sin embargo, podr autorizarla a realizar una actividad ligera.   Lleve un registro de la cantidad y la saturacin de las toallas higinicas que utiliza cada da. Escriba esta informacin:   No use tampones. No utilice duchas vaginales.   No  tenga relaciones sexuales u orgasmos hasta que el mdico la autorice.   Guarde una muestra de los tejidos para que el profesional lo inspeccione.   Tome medicamentos para el dolor slo con el permiso del mdico.   No tome aspirina, ya que puede causar hemorragias.  SOLICITE ATENCIN MDICA INMEDIATAMENTE SI:  Siente calambres intensos en el estmago, en la espalda o en el vientre (abdomen).   Usted tiene una temperatura oral de ms de 102 F (38.9 C) y no puede controlarla con medicamentos.   Elimina cogulos o tejidos grandes.   La hemorragia aumenta o se siente mareada dbil o tiene episodios de desmayos.   Comienza a sentir escalofros.   Tiene una prdida de lquido por la vagina.   No puede mover el intestino. Podra tratarse de un embarazo ectpico.  Document Released: 09/07/2005 Document Revised: 11/17/2011 Exit[employer] Patient Information 2012 [employer]. 

## 2012-02-16 NOTE — Progress Notes (Signed)
Pt states she started having vaginal bleeding today at about 1700. Pt states she had some bleeding that soiled her clothes

## 2012-02-16 NOTE — ED Provider Notes (Signed)
History   Pt presents today c/o heavy vag bleeding that began around 5pm. She is currently 10.[redacted]wks pregnant. She denies recent intercourse, abd pain, vag irritation, vag dc, fever, or any other sx at this time.  Chief Complaint  Patient presents with  . Vaginal Bleeding   HPI  OB History    Grav Para Term Preterm Abortions TAB SAB Ect Mult Living   2 1 1       1       Past Medical History  Diagnosis Date  . No pertinent past medical history     Past Surgical History  Procedure Date  . No past surgeries     Family History  Problem Relation Age of Onset  . Anesthesia problems Neg Hx   . Hypotension Neg Hx   . Malignant hyperthermia Neg Hx   . Pseudochol deficiency Neg Hx     History  Substance Use Topics  . Smoking status: Never Smoker   . Smokeless tobacco: Not on file  . Alcohol Use: No    Allergies:  Allergies  Allergen Reactions  . Iohexol      Code: SOB, Desc: IMMEDIATELY SOB FOLLOWING IV INJECTION, SPOTTY HIVES, PT GIVEN BENADRYL AND EPI-ARS 08/22/09, Onset Date: [id number]     Prescriptions prior to admission  Medication Sig Dispense Refill  . Prenatal Vit-Fe Fumarate-FA (PRENATAL MULTIVITAMIN) TABS Take 1 tablet by mouth daily.        Review of Systems  Constitutional: Negative for fever and chills.  Eyes: Negative for blurred vision and double vision.  Respiratory: Negative for cough, hemoptysis, sputum production, shortness of breath and wheezing.   Cardiovascular: Negative for chest pain and palpitations.  Gastrointestinal: Negative for nausea, vomiting, abdominal pain, diarrhea and constipation.  Genitourinary: Negative for dysuria, urgency, frequency and hematuria.  Neurological: Negative for dizziness and headaches.  Psychiatric/Behavioral: Negative for depression and suicidal ideas.   Physical Exam   Blood pressure 113/66, pulse 76, temperature 98.1 F (36.7 C), temperature source Oral, resp. rate 20, height 5' 2.5" (1.588 m), weight 120 lb  3.2 oz (54.522 kg), last menstrual period 12/02/2011, SpO2 100.00%.  Physical Exam  Nursing note and vitals reviewed. Constitutional: She is oriented to person, place, and time. She appears well-developed and well-nourished. No distress.  HENT:  Head: Normocephalic and atraumatic.  Eyes: EOM are normal. Pupils are equal, round, and reactive to light.  GI: Soft. She exhibits no distension and no mass. There is no tenderness. There is no rebound and no guarding.  Genitourinary: There is tenderness and bleeding around the vagina. No vaginal discharge found.       Cervix Lg/closed. Minimal amount of bright red bleeding noted in vag vault. Uterus 10wks size and slightly tender to palpation. No adnexal masses noted.  Neurological: She is alert and oriented to person, place, and time.  Skin: Skin is warm and dry. She is not diaphoretic.  Psychiatric: She has a normal mood and affect. Her behavior is normal. Judgment and thought content normal.    MAU Course  Procedures  Wet prep and GC/Chlamydia cultures done.  Results for orders placed during the hospital encounter of 02/16/12 (from the past 24 hour(s))  CBC     Status: Abnormal   Collection Time   02/16/12  8:09 PM      Component Value Range   WBC 8.9  4.0 - 10.5 (K/uL)   RBC 3.79 (*) 3.87 - 5.11 (MIL/uL)   Hemoglobin 11.6 (*) 12.0 - 15.0 (g/dL)  HCT 33.5 (*) 36.0 - 46.0 (%)   MCV 88.4  78.0 - 100.0 (fL)   MCH 30.6  26.0 - 34.0 (pg)   MCHC 34.6  30.0 - 36.0 (g/dL)   RDW 16.1  09.6 - 04.5 (%)   Platelets 290  150 - 400 (K/uL)  WET PREP, GENITAL     Status: Abnormal   Collection Time   02/16/12  8:12 PM      Component Value Range   Yeast Wet Prep HPF POC NONE SEEN  NONE SEEN    Trich, Wet Prep NONE SEEN  NONE SEEN    Clue Cells Wet Prep HPF POC MODERATE (*) NONE SEEN    WBC, Wet Prep HPF POC FEW (*) NONE SEEN    US Ob Comp Less 14 Wks  02/16/2012  *RADIOLOGY REPORT*  Clinical Data: First trimester pregnancy.  Persistent vaginal  bleeding.  LMP 12/02/2011.  OBSTETRIC <14 WK ULTRASOUND  Technique:  Transabdominal ultrasound was performed for evaluation of the gestation as well as the maternal uterus and adnexal regions.  Comparison:  Prior examination 01/31/2012.  Intrauterine gestational sac: Visualized/normal in shape. Yolk sac: Visualized Embryo: Visualized Cardiac Activity: Visualized Heart Rate: 152 bpm  CRL:  42.3 mm; 11 weeks 1 day.      [place] EDC: 09/05/2012.  Maternal uterus/Adnexae: There is no evidence of subchorionic hematoma.  The maternal ovaries appear normal with a corpus luteal cyst on the right. There is no adnexal mass or free pelvic fluid.  IMPRESSION: Single live intrauterine gestation with appropriate interval growth compared with prior examination.  No complications identified.  Original Report Authenticated By: Gerrianne Scale, M.D.     Assessment and Plan  Bleeding in preg: discussed with pt at length. Discussed diet, activity, risks, and precautions. Advised to avoid intercourse until seen in f/u with her OB provider.  BV: will tx with Flagyl. Warned of antabuse reaction.   Clinton Gallant. Navreet Bolda III, DrHSc, MPAS, PA-C  02/16/2012, 8:21 PM   Henrietta Hoover, PA 02/16/12 2131

## 2012-02-17 LAB — GC/CHLAMYDIA PROBE AMP, GENITAL: Chlamydia, DNA Probe: NEGATIVE

## 2012-03-29 LAB — OB RESULTS CONSOLE HIV ANTIBODY (ROUTINE TESTING): HIV: NONREACTIVE

## 2012-03-29 LAB — OB RESULTS CONSOLE RPR: RPR: NONREACTIVE

## 2012-03-29 LAB — OB RESULTS CONSOLE HEPATITIS B SURFACE ANTIGEN: Hepatitis B Surface Ag: NEGATIVE

## 2012-04-18 ENCOUNTER — Inpatient Hospital Stay (HOSPITAL_COMMUNITY): Payer: Self-pay

## 2012-04-18 ENCOUNTER — Inpatient Hospital Stay (HOSPITAL_COMMUNITY)
Admission: AD | Admit: 2012-04-18 | Discharge: 2012-04-19 | Disposition: A | Discharge status: Home / Self Care | Payer: Self-pay | Source: Ambulatory Visit | Attending: Obstetrics | Admitting: Obstetrics

## 2012-04-18 ENCOUNTER — Encounter (HOSPITAL_COMMUNITY): Payer: Self-pay

## 2012-04-18 DIAGNOSIS — O4410 Placenta previa with hemorrhage, unspecified trimester: Secondary | ICD-10-CM | POA: Insufficient documentation

## 2012-04-18 DIAGNOSIS — T7411XA Adult physical abuse, confirmed, initial encounter: Secondary | ICD-10-CM

## 2012-04-18 DIAGNOSIS — R109 Unspecified abdominal pain: Secondary | ICD-10-CM | POA: Insufficient documentation

## 2012-04-18 DIAGNOSIS — IMO0002 Reserved for concepts with insufficient information to code with codable children: Secondary | ICD-10-CM

## 2012-04-18 DIAGNOSIS — O09899 Supervision of other high risk pregnancies, unspecified trimester: Secondary | ICD-10-CM

## 2012-04-18 DIAGNOSIS — O4440 Low lying placenta NOS or without hemorrhage, unspecified trimester: Secondary | ICD-10-CM

## 2012-04-18 DIAGNOSIS — O99891 Other specified diseases and conditions complicating pregnancy: Secondary | ICD-10-CM | POA: Insufficient documentation

## 2012-04-18 DIAGNOSIS — O479 False labor, unspecified: Secondary | ICD-10-CM

## 2012-04-18 DIAGNOSIS — M549 Dorsalgia, unspecified: Secondary | ICD-10-CM | POA: Insufficient documentation

## 2012-04-18 LAB — URINE MICROSCOPIC-ADD ON

## 2012-04-18 LAB — URINALYSIS, ROUTINE W REFLEX MICROSCOPIC
Bilirubin Urine: NEGATIVE
Glucose, UA: NEGATIVE mg/dL
Ketones, ur: NEGATIVE mg/dL
Nitrite: NEGATIVE
Specific Gravity, Urine: 1.01 (ref 1.005–1.030)
pH: 6 (ref 5.0–8.0)

## 2012-04-18 MED ORDER — IBUPROFEN 600 MG PO TABS
600.0000 mg | ORAL_TABLET | Freq: Once | ORAL | Status: AC
  Administered 2012-04-18: 600 mg via ORAL
  Filled 2012-04-18: qty 1
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
## 2012-04-19 MED ORDER — PROGESTERONE 200 MG VA SUPP: 200.0000 mg | Freq: Every day | VAGINAL | Status: DC

## 2012-04-19 NOTE — Discharge Instructions (Signed)
Parto prematuro  (Preterm Labor) El parto prematuro comienza antes de la semana 37 de embarazo. La duracin de un embarazo normal es de 39 a 41 semanas.  CAUSAS  Generalmente no hay una causa que pueda identificarse. Sin embargo, una de las causas conocidas ms frecuentes son las infecciones. Las infecciones del tero, el cuello, la vagina, el lquido amnitico, la vejiga, los riones y hasta de los pulmones (neumona) pueden hacer que el trabajo de parto se inicie. Otras causas son:   Infecciones urogenitales, como infecciones por hongos y vaginosis bacteriana.   Anormalidades uterinas (forma del tero, sptum uterino, fibromas, hemorragias en la placenta).   Un cuello que ha sido operado y se abre prematuramente.   Malformaciones del beb.   Gestaciones mltiples (mellizos, trillizos y ms).   Ruptura del saco amnitico.  :Otros factores de riesgo del parto prematuro son   Historia previa de parto prematuro.   Ruptura prematura de las membranas.   La placenta cubre la apertura del cuello (placenta previa).   La placenta se separa del tero (abrupcin placentaria).   El cuello es demasiado dbil para contener al beb en el tero (cuello incompetente).   Hay mucho lquido en el saco amnitico (polihidramnios).   Consumo de drogas o hbito de fumar durante el embarazo.   No aumentar de peso lo suficiente durante el embarazo.   Mujeres menores de 18 aos o mayores de 35 aos aos.   Nivel socioeconmico bajo.   Raza afroamericana.  SNTOMAS  Los signos y sntomas son:   Clicos del tipo menstrual   Contracciones con un intervalo entre 30 y 70 segundos, comienzan a ser regulares, se hacen ms frecuentes y se hacen ms intensas y dolorosas.   Contracciones que comienzan en la parte superior del tero y se expanden hacia abajo, hacia la zona inferior del abdomen y la espalda.   Sensacin de presin en la pelvis o dolor en la espalda.   Aparece una secrecin acuosa o  sanguinolenta por la vagina.  DIAGNSTICO  El diagnstico puede confirmarse:   Con un examen vaginal.   Ecografa del cuello.   Muestra (hisopado) de las secreciones crvico-vaginales. Estas muestras se analizan para buscar la presencia de fibronectina fetal. Esta protena que se encuentra en las secreciones del tero y se asocia con el parto prematuro.   Monitoreo fetal  TRATAMIENTO  Segn el tiempo del embarazo y otras circunstancias, el mdico puede indicar reposo en cama. Si es necesario, le indicarn medicamentos para detener las contracciones y apurar la maduracin de los pulmones del feto. Si el trabajo de parto se inicia antes de las 34 semanas de embarazo, se recomienda la hospitalizacin. El tratamiento depende de las condiciones en que se encuentre la madre y el beb.  PREVENCIN  Hay algunas cosas que una madre puede hacer para disminuir el riesgo de trabajo de parto prematuro en futuros embarazos. Una mam puede:   Dejar de fumar.   Mantener un peso saludable y evitar sustancias qumicas y drogas innecesarias.   Controlar todo tipo de infeccin.   Informar al mdico si tiene una historia conocida de parto prematuro.  Document Released: 03/06/2008 Document Revised: 11/17/2011 Exit[employer] Patient Information 2012 [employer]. 

## 2012-04-20 LAB — URINE CULTURE

## 2012-05-23 ENCOUNTER — Inpatient Hospital Stay (HOSPITAL_COMMUNITY): Payer: Self-pay

## 2012-05-23 ENCOUNTER — Inpatient Hospital Stay (HOSPITAL_COMMUNITY)
Admission: AD | Admit: 2012-05-23 | Discharge: 2012-05-23 | Disposition: A | Discharge status: Home / Self Care | Payer: Self-pay | Source: Ambulatory Visit | Attending: Obstetrics | Admitting: Obstetrics

## 2012-05-23 DIAGNOSIS — K219 Gastro-esophageal reflux disease without esophagitis: Secondary | ICD-10-CM | POA: Insufficient documentation

## 2012-05-23 DIAGNOSIS — R3129 Other microscopic hematuria: Secondary | ICD-10-CM

## 2012-05-23 DIAGNOSIS — O99891 Other specified diseases and conditions complicating pregnancy: Secondary | ICD-10-CM | POA: Insufficient documentation

## 2012-05-23 DIAGNOSIS — M545 Low back pain, unspecified: Secondary | ICD-10-CM | POA: Insufficient documentation

## 2012-05-23 DIAGNOSIS — M5380 Other specified dorsopathies, site unspecified: Secondary | ICD-10-CM | POA: Insufficient documentation

## 2012-05-23 DIAGNOSIS — O26899 Other specified pregnancy related conditions, unspecified trimester: Secondary | ICD-10-CM

## 2012-05-23 DIAGNOSIS — R109 Unspecified abdominal pain: Secondary | ICD-10-CM | POA: Insufficient documentation

## 2012-05-23 DIAGNOSIS — M62830 Muscle spasm of back: Secondary | ICD-10-CM

## 2012-05-23 LAB — CBC
HCT: 31.4 % — ABNORMAL LOW (ref 36.0–46.0)
Platelets: 271 10*3/uL (ref 150–400)
RBC: 3.4 MIL/uL — ABNORMAL LOW (ref 3.87–5.11)
RDW: 12.8 % (ref 11.5–15.5)
WBC: 8.6 10*3/uL (ref 4.0–10.5)

## 2012-05-23 LAB — URINALYSIS, ROUTINE W REFLEX MICROSCOPIC
Bilirubin Urine: NEGATIVE
Glucose, UA: NEGATIVE mg/dL
Leukocytes, UA: NEGATIVE
Nitrite: NEGATIVE
Specific Gravity, Urine: <1.005 — ABNORMAL LOW (ref 1.005–1.030)
pH: 6.5 (ref 5.0–8.0)

## 2012-05-23 LAB — COMPREHENSIVE METABOLIC PANEL
Albumin: 2.9 g/dL — ABNORMAL LOW (ref 3.5–5.2)
BUN: 5 mg/dL — ABNORMAL LOW (ref 6–23)
Calcium: 8.7 mg/dL (ref 8.4–10.5)
Creatinine, Ser: 0.43 mg/dL — ABNORMAL LOW (ref 0.50–1.10)
GFR calc Af Amer: >90 mL/min (ref >90)
Glucose, Bld: 93 mg/dL (ref 70–99)
Potassium: 3.6 mEq/L (ref 3.5–5.1)
Total Protein: 6.2 g/dL (ref 6.0–8.3)

## 2012-05-23 LAB — URINE MICROSCOPIC-ADD ON

## 2012-05-23 MED ORDER — GI COCKTAIL ~~LOC~~
30.0000 mL | Freq: Once | ORAL | Status: AC
  Administered 2012-05-23: 30 mL via ORAL
  Filled 2012-05-23: qty 30

## 2012-05-23 MED ORDER — CYCLOBENZAPRINE HCL 5 MG PO TABS: 5.0000 mg | ORAL_TABLET | Freq: Three times a day (TID) | ORAL | Status: DC | PRN

## 2012-05-23 NOTE — MAU Note (Signed)
DID NOT BUY THE PROJESTERONE

## 2012-05-23 NOTE — MAU Provider Note (Signed)
  History     CSN: [id number]  Arrival date and time: 05/23/12 0206   First Provider Initiated Contact with Patient 05/23/12 0308      No chief complaint on file.  HPI This is a 31 y.o. female who presents at [redacted]w[redacted]d gestation with c/o upper abdominal pain and severe low to mid-back pain. Denies contractions. Had diarrhea earlier this week. No constipation. Did have nausea but no vomiting. No fever.   OB History    Grav Para Term Preterm Abortions TAB SAB Ect Mult Living   2 1 1  0 0 0 0 0 0 1      Past Medical History  Diagnosis Date  . Asthma     Past Surgical History  Procedure Date  . No past surgeries     Family History  Problem Relation Age of Onset  . Anesthesia problems Neg Hx   . Hypotension Neg Hx   . Malignant hyperthermia Neg Hx   . Pseudochol deficiency Neg Hx     History  Substance Use Topics  . Smoking status: Never Smoker   . Smokeless tobacco: Not on file  . Alcohol Use: No    Allergies:  Allergies  Allergen Reactions  . Iohexol      Code: SOB, Desc: IMMEDIATELY SOB FOLLOWING IV INJECTION, SPOTTY HIVES, PT GIVEN BENADRYL AND EPI-ARS 08/22/09, Onset Date: [id number]     Prescriptions prior to admission  Medication Sig Dispense Refill  . oxyCODONE-acetaminophen (PERCOCET) 5-325 MG per tablet Take 1 tablet by mouth every 4 (four) hours as needed. For pain      . Prenatal Vit-Fe Fumarate-FA (PRENATAL MULTIVITAMIN) TABS Take 1 tablet by mouth daily.      . progesterone 200 MG SUPP Place 1 suppository (200 mg total) vaginally at bedtime.  30 each  0    ROS As listed in HPI  Physical Exam   Blood pressure 115/62, pulse 85, temperature 97.4 F (36.3 C), height 5\' 2"  (1.575 m), weight 128 lb 4 oz (58.174 kg), last menstrual period 12/02/2011.  Physical Exam  Constitutional: She is oriented to person, place, and time. She appears well-developed and well-nourished. She appears distressed (with pain).  Cardiovascular: Normal rate.   Respiratory:  Effort normal.  GI: Soft. She exhibits no distension and no mass. There is tenderness (over epigastrum). There is no rebound and no guarding.  Genitourinary: Vagina normal and uterus normal. No vaginal discharge found.  Musculoskeletal: Normal range of motion. She exhibits no edema.  Neurological: She is alert and oriented to person, place, and time.  Skin: Skin is warm and dry.  Psychiatric: She has a normal mood and affect.   Dilation: Closed Effacement (%): 50 Exam by:: Wynelle Bourgeois, CNM  MAU Course  Procedures  MDM Given GI cocktail with considerable relief of abdominal and back pain. Suspect she had some reflux and back spasm. No change in cervix and no contractions.   Assessment and Plan  A:  SIUP at [redacted]w[redacted]d      Acid reflux      Back spasm P:  Discharge home      Recommend Tums PRN      Rx Flexeril for PRN use      Followup with appt on Thursday  [facility] 05/23/2012, 4:39 AM

## 2012-05-23 NOTE — MAU Note (Signed)
PT SAYS WITH INTERPRETER- PAIN  STARTED IN LOWER ABD  10-15 MIN AGO- WITH CRAMPS IN HER BACK-.  SHE WAS SLEEPING - PAIN AWOKE HER.  HAD SEX - Sunday.  PAIN IS CONSTANT.

## 2012-05-23 NOTE — Discharge Instructions (Signed)
Dolor abdominal en el embarazo  (Abdominal Pain During Pregnancy) Las molestias abdominales son frecuentes durante el embarazo. Generalmente no causan ningn dao. Puede tener numerosas causas. Algunas causas son ms graves que otras. Ciertas causas se diagnostican fcilmente. En algunos casos, se demora algn tiempo para comprender el diagnstico. Otras veces la causa no se conoce. El dolor abdominal puede ser un signo de que algo no anda bien en el embarazo, o puede ser debido a una causa totalmente diferente. Por este motivo, siempre comente a su mdico cuando sienta molestias abdominales.  CAUSAS  Las causas ms frecuentes y que no causan ningn dao son:   Constipacin.   Exceso de gases y meteorismo.   Dolor en el ligamento redondo. Este dolor se siente en los pliegues de la ingle.   La posicin en que se encuentra el beb o la placenta.   Las pataditas del beb.   Contracciones de Braxton-Hicks. Estas son contracciones suaves que no producen dilatacin del cuello.  Otras causas graves de dolor abdominal son:   Embarazo ectpico Se produce cuando un vulo fertilizado se implanta fuera del tero.   Aborto espontneo.   Parto prematuro. El parto prematuro comienza antes de la semana 37 de embarazo.   Desprendimiento de la placenta. Ocurre cuando la placenta se separa parcial o completamente del tero.   Preeclampsia Generalmente se asocia a hipertensin arterial y tambin se denomina "toxemia del embarazo".   Infecciones del tero o del lquido amnitico.  Las causas que no se relacionan con el embarazo son:   Infeccin del tracto urinario.   Clculos o inflamacin de la vescula.   Hepatitis u otras enfermedades del hgado.   Trastornos intestinales, virus en el estmago, intoxicacin alimentaria, lcera.   Apendicitis.   Clculos en el rin (renales).   Infeccin renal (pielonefritis).  INSTRUCCIONES PARA EL CUIDADO EN EL HOGAR  Si el dolor es leve:   No tenga  relaciones sexuales y no coloque nada dentro de la vagina hasta que los sntomas hayan desaparecido completamente.   Descanse todo lo que pueda hasta que el dolor haya calmado. Si el dolor no mejora en una hora, comunquese con su mdico.   Si siente nuseas, beba lquidos claros. Evite los alimentos slidos hasta que no sienta malestar en el estmago o desaparezcan las nuseas.   Tome slo la medicacin que le indic el profesional.   Concurra puntualmente a las citas con el mdico.  SOLICITE ATENCIN MDICA DE INMEDIATO SI:   Tiene un sangrado, prdida de lquidos o lo observa al limpiarse la vagina con tis.   El dolor o los clicos aumentan.   Tiene vmitos persistentes.   Comienza a sentir dolor al orinar u observa sangre.   Tiene fiebre.   Los movimientos del beb disminuyen.   Nota un debilitamiento extremo o se marea.   Tiene dificultad para respirar con o sin dolor abdominal.   Siente un dolor de cabeza intenso junto al dolor abdominal.   Tiene secrecin vaginal con dolor abdominal.   Tiene diarrea persistente.   El dolor abdominal sigue an despus de hacer reposo.  ASEGRESE DE QUE:   Comprende estas instrucciones.   Controlar su enfermedad.   Solicitar ayuda de inmediato si no mejora o si empeora.  Document Released: 11/28/2005 Document Revised: 11/17/2011 ExitCare Patient Information 2012 [employer]. 

## 2012-05-23 NOTE — MAU Note (Signed)
IN RM 10- MOSTLY  IN UPPER ABD. AND BACK- NO NAUSEA

## 2012-05-24 ENCOUNTER — Other Ambulatory Visit: Payer: Self-pay | Admitting: Advanced Practice Midwife

## 2012-05-24 MED ORDER — CEPHALEXIN 500 MG PO CAPS: 500.0000 mg | ORAL_CAPSULE | Freq: Three times a day (TID) | ORAL | Status: DC

## 2012-05-25 LAB — URINE CULTURE
Colony Count: 75000
Culture  Setup Time: 201306121046

## 2012-05-27 ENCOUNTER — Encounter (HOSPITAL_COMMUNITY): Payer: Self-pay | Admitting: *Deleted

## 2012-05-27 ENCOUNTER — Inpatient Hospital Stay (HOSPITAL_COMMUNITY)
Admission: AD | Admit: 2012-05-27 | Discharge: 2012-05-30 | DRG: 781 | Disposition: A | Discharge status: Home / Self Care | Payer: Medicaid Other | Source: Ambulatory Visit | Attending: Obstetrics | Admitting: Obstetrics

## 2012-05-27 DIAGNOSIS — O2390 Unspecified genitourinary tract infection in pregnancy, unspecified trimester: Principal | ICD-10-CM | POA: Diagnosis present

## 2012-05-27 DIAGNOSIS — R509 Fever, unspecified: Secondary | ICD-10-CM | POA: Diagnosis present

## 2012-05-27 DIAGNOSIS — O2300 Infections of kidney in pregnancy, unspecified trimester: Secondary | ICD-10-CM | POA: Diagnosis present

## 2012-05-27 DIAGNOSIS — Z3A25 25 weeks gestation of pregnancy: Secondary | ICD-10-CM

## 2012-05-27 DIAGNOSIS — R51 Headache: Secondary | ICD-10-CM | POA: Diagnosis present

## 2012-05-27 DIAGNOSIS — N12 Tubulo-interstitial nephritis, not specified as acute or chronic: Secondary | ICD-10-CM | POA: Diagnosis present

## 2012-05-27 HISTORY — DX: Gestational (pregnancy-induced) hypertension without significant proteinuria, unspecified trimester: O13.9

## 2012-05-27 LAB — DIFFERENTIAL
Basophils Absolute: 0 10*3/uL (ref 0.0–0.1)
Eosinophils Absolute: 0 10*3/uL (ref 0.0–0.7)
Eosinophils Relative: 0 % (ref 0–5)
Lymphocytes Relative: 6 % — ABNORMAL LOW (ref 12–46)
Monocytes Absolute: 1.3 10*3/uL — ABNORMAL HIGH (ref 0.1–1.0)

## 2012-05-27 LAB — CBC
HCT: 30.3 % — ABNORMAL LOW (ref 36.0–46.0)
MCH: 31.8 pg (ref 26.0–34.0)
MCV: 91 fL (ref 78.0–100.0)
Platelets: 235 10*3/uL (ref 150–400)
RDW: 12.9 % (ref 11.5–15.5)

## 2012-05-27 MED ORDER — CEFTRIAXONE SODIUM 1 G IJ SOLR
1.0000 g | Freq: Two times a day (BID) | INTRAMUSCULAR | Status: DC
  Administered 2012-05-27 – 2012-05-30 (×7): 1 g via INTRAVENOUS
  Filled 2012-05-27 (×7): qty 10

## 2012-05-27 MED ORDER — FENTANYL CITRATE 0.05 MG/ML IJ SOLN
50.0000 ug | Freq: Once | INTRAMUSCULAR | Status: AC
  Administered 2012-05-27: 50 ug via INTRAVENOUS
  Filled 2012-05-27: qty 2

## 2012-05-27 MED ORDER — SODIUM CHLORIDE 0.9 % IV SOLN
INTRAVENOUS | Status: DC
  Administered 2012-05-27 – 2012-05-30 (×13): via INTRAVENOUS

## 2012-05-27 MED ORDER — FENTANYL CITRATE 0.05 MG/ML IJ SOLN
50.0000 ug | INTRAMUSCULAR | Status: DC | PRN
  Administered 2012-05-27 – 2012-05-28 (×3): 50 ug via INTRAVENOUS
  Filled 2012-05-27 (×4): qty 2

## 2012-05-27 MED ORDER — SODIUM CHLORIDE 0.9 % IV BOLUS (SEPSIS)
500.0000 mL | Freq: Once | INTRAVENOUS | Status: AC
  Administered 2012-05-27: 500 mL via INTRAVENOUS

## 2012-05-27 MED ORDER — ZOLPIDEM TARTRATE 10 MG PO TABS: 10.0000 mg | ORAL_TABLET | Freq: Every evening | ORAL | Status: DC | PRN

## 2012-05-27 MED ORDER — ACETAMINOPHEN 325 MG PO TABS
650.0000 mg | ORAL_TABLET | ORAL | Status: DC | PRN
  Administered 2012-05-27 – 2012-05-30 (×7): 650 mg via ORAL
  Filled 2012-05-27 (×7): qty 2

## 2012-05-27 MED ORDER — PRENATAL MULTIVITAMIN CH
1.0000 | ORAL_TABLET | Freq: Every day | ORAL | Status: DC
  Administered 2012-05-27 – 2012-05-29 (×3): 1 via ORAL
  Filled 2012-05-27 (×3): qty 1

## 2012-05-27 MED ORDER — DOCUSATE SODIUM 100 MG PO CAPS
100.0000 mg | ORAL_CAPSULE | Freq: Every day | ORAL | Status: DC
  Administered 2012-05-27 – 2012-05-29 (×3): 100 mg via ORAL
  Filled 2012-05-27 (×3): qty 1

## 2012-05-27 MED ORDER — LACTATED RINGERS IV SOLN
INTRAVENOUS | Status: DC
  Administered 2012-05-27: 02:00:00 via INTRAVENOUS

## 2012-05-27 MED ORDER — CALCIUM CARBONATE ANTACID 500 MG PO CHEW: 2.0000 | CHEWABLE_TABLET | ORAL | Status: DC | PRN

## 2012-05-27 MED ORDER — LACTATED RINGERS IV SOLN: INTRAVENOUS | Status: DC

## 2012-05-27 MED ORDER — PROGESTERONE 200 MG VA SUPP
200.0000 mg | Freq: Every day | VAGINAL | Status: DC
  Administered 2012-05-28: 200 mg via VAGINAL
  Filled 2012-05-27: qty 1

## 2012-05-27 NOTE — MAU Note (Signed)
Pt states, " I was here two days ago with pain in my back at my waistline but was sent home. Now the pain is on my right back and starting running a fever at 9 pm tonight and have a headache."

## 2012-05-27 NOTE — Progress Notes (Signed)
To antenatal

## 2012-05-27 NOTE — H&P (Signed)
Chief Complaint:  Back Pain, Fever and Headache    First Provider Initiated Contact with Patient 05/27/12 0148      Cathy Nash is  31 y.o. G2P1001.  Patient's last menstrual period was 12/02/2011..  [redacted]w[redacted]d  She presents complaining of Back Pain, Fever and Headache . Onset is described as ongoing and has been present for  5 days. Pt was seen in MAU on 05/23/12, dx with muscle spasms and indigestion. Urine culture from that visit was positive and patient has been taking Keflex. Reports feeling hot tonight, believes she has a fever. Vomited twice at 6:30pm yesterday. Took percocet, flexeril and Keflex at ~ 7pm without improvement of pain.  Obstetrical/Gynecological History: OB History    Grav Para Term Preterm Abortions TAB SAB Ect Mult Living   2 1 1  0 0 0 0 0 0 1      Past Medical History: Past Medical History  Diagnosis Date  . Asthma     Past Surgical History: Past Surgical History  Procedure Date  . No past surgeries     Family History: Family History  Problem Relation Age of Onset  . Anesthesia problems Neg Hx   . Hypotension Neg Hx   . Malignant hyperthermia Neg Hx   . Pseudochol deficiency Neg Hx   . Other Neg Hx     Social History: History  Substance Use Topics  . Smoking status: Never Smoker   . Smokeless tobacco: Not on file  . Alcohol Use: No    Allergies:  Allergies  Allergen Reactions  . Iohexol      Code: SOB, Desc: IMMEDIATELY SOB FOLLOWING IV INJECTION, SPOTTY HIVES, PT GIVEN BENADRYL AND EPI-ARS 08/22/09, Onset Date: 09811914     Prescriptions prior to admission  Medication Sig Dispense Refill  . cephALEXin (KEFLEX) 500 MG capsule Take 1 capsule (500 mg total) by mouth 3 (three) times daily.  21 capsule  0  . cyclobenzaprine (FLEXERIL) 5 MG tablet Take 1 tablet (5 mg total) by mouth 3 (three) times daily as needed for muscle spasms.  30 tablet  0  . oxyCODONE-acetaminophen (PERCOCET) 5-325 MG per tablet Take 1 tablet by mouth every 4  (four) hours as needed. For pain      . Prenatal Vit-Fe Fumarate-FA (PRENATAL MULTIVITAMIN) TABS Take 1 tablet by mouth daily.      . progesterone 200 MG SUPP Place 1 suppository (200 mg total) vaginally at bedtime.  30 each  0    Review of Systems - History obtained from the patient, + fever Respiratory ROS: no cough, shortness of breath, or wheezing Cardiovascular ROS: no chest pain or dyspnea on exertion Gastrointestinal ROS: no abdominal pain, change in bowel habits, or black or bloody stools, + vomiting Genito-Urinary ROS: no dysuria, trouble voiding, or hematuria negative for - vulvar/vaginal symptoms Back: right-sided flank pain Neurological ROS: no TIA or stroke symptoms  Physical Exam   Blood pressure 114/70, pulse 120, temperature 98.7 F (37.1 C), temperature source Oral, height 5' 2.75" (1.594 m), weight 129 lb 2 oz (58.571 kg), last menstrual period 12/02/2011, SpO2 99.00%.  General: General appearance - alert, well appearing, and in no distress, oriented to person, place, and time and uncomfortable appearing Mental status - alert, oriented to person, place, and time, normal mood, behavior, speech, dress, motor activity, and thought processes Lymphatics - no palpable lymphadenopathy, no hepatosplenomegaly Lungs: CTAB A&P Heart: RRR, no murmur or bruits Abdomen - gravid, non tender, right CVAT, exquisite  Back exam -  full range of motion, no tenderness, palpable spasm or pain on motion, warm to touch Extremities - peripheral pulses normal, no pedal edema, no clubbing or cyanosis, no pedal edema noted Focused Gynecological Exam: examination not indicated FHR: 155, mod variability Cat I for gest age Toco: no contractions  Labs: Recent Results (from the past 24 hour(s))  CBC   Collection Time   05/27/12  1:30 AM      Component Value Range   WBC 12.7 (*) 4.0 - 10.5 K/uL   RBC 3.33 (*) 3.87 - 5.11 MIL/uL   Hemoglobin 10.6 (*) 12.0 - 15.0 g/dL   HCT 16.1 (*) 09.6 - 04.5 %    MCV 91.0  78.0 - 100.0 fL   MCH 31.8  26.0 - 34.0 pg   MCHC 35.0  30.0 - 36.0 g/dL   RDW 40.9  81.1 - 91.4 %   Platelets 235  150 - 400 K/uL  DIFFERENTIAL   Collection Time   05/27/12  1:30 AM      Component Value Range   Neutrophils Relative 84 (*) 43 - 77 %   Neutro Abs 10.6 (*) 1.7 - 7.7 K/uL   Lymphocytes Relative 6 (*) 12 - 46 %   Lymphs Abs 0.8  0.7 - 4.0 K/uL   Monocytes Relative 10  3 - 12 %   Monocytes Absolute 1.3 (*) 0.1 - 1.0 K/uL   Eosinophils Relative 0  0 - 5 %   Eosinophils Absolute 0.0  0.0 - 0.7 K/uL   Basophils Relative 0  0 - 1 %   Basophils Absolute 0.0  0.0 - 0.1 K/uL    Urine culture Status: Final result MyChart: Not Released         Component  Value    Specimen Description  URINE, CLEAN CATCH    Special Requests  NONE    Culture Setup Time  [id number]    Colony Count  75,000 COLONIES/ML    Culture  ESCHERICHIA COLI    Report Status  05/25/2012 FINAL    Organism ID, Bacteria  ESCHERICHIA COLI    Resulting Agency  [facility]     Culture & Susceptibility     ESCHERICHIA COLI          Antibiotic  Sensitivity  Microscan  Status      AMPICILLIN  Sensitive  <=2  Final      Method:  MIC      CEFAZOLIN  Sensitive  <=4  Final      Method:  MIC      CEFTRIAXONE  Sensitive  <=1  Final      Method:  MIC      CIPROFLOXACIN  Sensitive  <=0.25  Final      Method:  MIC      GENTAMICIN  Sensitive  <=1  Final      Method:  MIC      LEVOFLOXACIN  Sensitive  <=0.12  Final      Method:  MIC      NITROFURANTOIN  Sensitive  32  Final      Method:  MIC      PIP/TAZO  Sensitive  <=4  Final      Method:  MIC      TOBRAMYCIN  Sensitive  <=1  Final      Method:  MIC      TRIMETH/SULFA  Sensitive  <=20  Final      Method:  MIC  Comments  ESCHERICHIA COLI (MIC)        ESCHERICHIA COLI     Imaging Studies:  US Renal  05/23/2012  *RADIOLOGY REPORT*  Clinical Data: Hematuria, back pain, [redacted] weeks pregnant.  RENAL/URINARY TRACT ULTRASOUND COMPLETE   Comparison:  01/28/2010 CT  Findings:  Right Kidney:  Normal sonographic appearance.  Measures 10.3 cm. No hydronephrosis.  Left Kidney:  Normal sonographic appearance.  Measures 10.3 cm.  No hydronephrosis.  Bladder:  Nondistended.  IMPRESSION: Normal sonographic appearance to the kidneys.  Original Report Authenticated By: Waneta Martins, M.D.   MD Consult: 2:12 AM LM on VM for Dr. Tamela Oddi 2:36 AM Discussed with Dr. Tamela Oddi, agrees with admission plan.  Assessment: Right Pyleonephritis  Plan: Admit to ante IV ABX and pain management BMZ not indicated due to no s/s PTL  Candice Lunney E. 05/27/2012, 2:42 AM

## 2012-05-27 NOTE — MAU Note (Signed)
Pt also states that she has vomited two times today and that baby isnot moving as much.

## 2012-05-27 NOTE — MAU Provider Note (Signed)
Chief Complaint:  Back Pain, Fever and Headache    First Provider Initiated Contact with Patient 05/27/12 0148      Cathy Nash is  31 y.o. G2P1001.  Patient's last menstrual period was 12/02/2011..  [redacted]w[redacted]d  She presents complaining of Back Pain, Fever and Headache . Onset is described as ongoing and has been present for  5 days. Pt was seen in MAU on 05/23/12, dx with muscle spasms and indigestion. Urine culture from that visit was positive and patient has been taking Keflex. Reports feeling hot tonight, believes she has a fever. Vomited twice at 6:30pm yesterday. Took percocet, flexeril and Keflex at ~ 7pm without improvement of pain.  Obstetrical/Gynecological History: OB History    Grav Para Term Preterm Abortions TAB SAB Ect Mult Living   2 1 1 0 0 0 0 0 0 1      Past Medical History: Past Medical History  Diagnosis Date  . Asthma     Past Surgical History: Past Surgical History  Procedure Date  . No past surgeries     Family History: Family History  Problem Relation Age of Onset  . Anesthesia problems Neg Hx   . Hypotension Neg Hx   . Malignant hyperthermia Neg Hx   . Pseudochol deficiency Neg Hx   . Other Neg Hx     Social History: History  Substance Use Topics  . Smoking status: Never Smoker   . Smokeless tobacco: Not on file  . Alcohol Use: No    Allergies:  Allergies  Allergen Reactions  . Iohexol      Code: SOB, Desc: IMMEDIATELY SOB FOLLOWING IV INJECTION, SPOTTY HIVES, PT GIVEN BENADRYL AND EPI-ARS 08/22/09, Onset Date: 09112010     Prescriptions prior to admission  Medication Sig Dispense Refill  . cephALEXin (KEFLEX) 500 MG capsule Take 1 capsule (500 mg total) by mouth 3 (three) times daily.  21 capsule  0  . cyclobenzaprine (FLEXERIL) 5 MG tablet Take 1 tablet (5 mg total) by mouth 3 (three) times daily as needed for muscle spasms.  30 tablet  0  . oxyCODONE-acetaminophen (PERCOCET) 5-325 MG per tablet Take 1 tablet by mouth every 4  (four) hours as needed. For pain      . Prenatal Vit-Fe Fumarate-FA (PRENATAL MULTIVITAMIN) TABS Take 1 tablet by mouth daily.      . progesterone 200 MG SUPP Place 1 suppository (200 mg total) vaginally at bedtime.  30 each  0    Review of Systems - History obtained from the patient, + fever Respiratory ROS: no cough, shortness of breath, or wheezing Cardiovascular ROS: no chest pain or dyspnea on exertion Gastrointestinal ROS: no abdominal pain, change in bowel habits, or black or bloody stools, + vomiting Genito-Urinary ROS: no dysuria, trouble voiding, or hematuria negative for - vulvar/vaginal symptoms Back: right-sided flank pain Neurological ROS: no TIA or stroke symptoms  Physical Exam   Blood pressure 114/70, pulse 120, temperature 98.7 F (37.1 C), temperature source Oral, height 5' 2.75" (1.594 m), weight 129 lb 2 oz (58.571 kg), last menstrual period 12/02/2011, SpO2 99.00%.  General: General appearance - alert, well appearing, and in no distress, oriented to person, place, and time and uncomfortable appearing Mental status - alert, oriented to person, place, and time, normal mood, behavior, speech, dress, motor activity, and thought processes Lymphatics - no palpable lymphadenopathy, no hepatosplenomegaly Lungs: CTAB A&P Heart: RRR, no murmur or bruits Abdomen - gravid, non tender, right CVAT, exquisite  Back exam -   full range of motion, no tenderness, palpable spasm or pain on motion, warm to touch Extremities - peripheral pulses normal, no pedal edema, no clubbing or cyanosis, no pedal edema noted Focused Gynecological Exam: examination not indicated FHR: 155, mod variability Cat I for gest age Toco: no contractions  Labs: Recent Results (from the past 24 hour(s))  CBC   Collection Time   05/27/12  1:30 AM      Component Value Range   WBC 12.7 (*) 4.0 - 10.5 K/uL   RBC 3.33 (*) 3.87 - 5.11 MIL/uL   Hemoglobin 10.6 (*) 12.0 - 15.0 g/dL   HCT 30.3 (*) 36.0 - 46.0 %    MCV 91.0  78.0 - 100.0 fL   MCH 31.8  26.0 - 34.0 pg   MCHC 35.0  30.0 - 36.0 g/dL   RDW 12.9  11.5 - 15.5 %   Platelets 235  150 - 400 K/uL  DIFFERENTIAL   Collection Time   05/27/12  1:30 AM      Component Value Range   Neutrophils Relative 84 (*) 43 - 77 %   Neutro Abs 10.6 (*) 1.7 - 7.7 K/uL   Lymphocytes Relative 6 (*) 12 - 46 %   Lymphs Abs 0.8  0.7 - 4.0 K/uL   Monocytes Relative 10  3 - 12 %   Monocytes Absolute 1.3 (*) 0.1 - 1.0 K/uL   Eosinophils Relative 0  0 - 5 %   Eosinophils Absolute 0.0  0.0 - 0.7 K/uL   Basophils Relative 0  0 - 1 %   Basophils Absolute 0.0  0.0 - 0.1 K/uL    Urine culture Status: Final result MyChart: Not Released         Component  Value    Specimen Description  URINE, CLEAN CATCH    Special Requests  NONE    Culture Setup Time  [id number]    Colony Count  75,000 COLONIES/ML    Culture  ESCHERICHIA COLI    Report Status  05/25/2012 FINAL    Organism ID, Bacteria  ESCHERICHIA COLI    Resulting Agency  [facility]     Culture & Susceptibility     ESCHERICHIA COLI          Antibiotic  Sensitivity  Microscan  Status      AMPICILLIN  Sensitive  <=2  Final      Method:  MIC      CEFAZOLIN  Sensitive  <=4  Final      Method:  MIC      CEFTRIAXONE  Sensitive  <=1  Final      Method:  MIC      CIPROFLOXACIN  Sensitive  <=0.25  Final      Method:  MIC      GENTAMICIN  Sensitive  <=1  Final      Method:  MIC      LEVOFLOXACIN  Sensitive  <=0.12  Final      Method:  MIC      NITROFURANTOIN  Sensitive  32  Final      Method:  MIC      PIP/TAZO  Sensitive  <=4  Final      Method:  MIC      TOBRAMYCIN  Sensitive  <=1  Final      Method:  MIC      TRIMETH/SULFA  Sensitive  <=20  Final      Method:  MIC         Comments  ESCHERICHIA COLI (MIC)        ESCHERICHIA COLI     Imaging Studies:  Us Renal  05/23/2012  *RADIOLOGY REPORT*  Clinical Data: Hematuria, back pain, [redacted] weeks pregnant.  RENAL/URINARY TRACT ULTRASOUND COMPLETE   Comparison:  01/28/2010 CT  Findings:  Right Kidney:  Normal sonographic appearance.  Measures 10.3 cm. No hydronephrosis.  Left Kidney:  Normal sonographic appearance.  Measures 10.3 cm.  No hydronephrosis.  Bladder:  Nondistended.  IMPRESSION: Normal sonographic appearance to the kidneys.  Original Report Authenticated By: ANDREW J. DELGAIZO, M.D.   MD Consult: 2:12 AM LM on VM for Dr. Jackson-Moore 2:36 AM Discussed with Dr. Jackson-Moore, agrees with admission plan.  Assessment: Right Pyleonephritis  Plan: Admit to ante IV ABX and pain management BMZ not indicated due to no s/s PTL  Lash Matulich E. 05/27/2012, 2:42 AM 

## 2012-05-27 NOTE — Progress Notes (Signed)
Patient ID: Cathy Nash, female   DOB: [birth year], 31 y.o.   MRN: [id number] Hospital Day: 1  S: Preterm labor symptoms: none; c/o flank pain, fever  O: Blood pressure 81/19, pulse 147, temperature 97.5 F (36.4 C), temperature source Oral, resp. rate 20, height 5' 2.75" (1.594 m), weight 58.571 kg (129 lb 2 oz), last menstrual period 12/02/2011, SpO2 99.00%.   WGN:FAOZHYQM: 150 bpm Toco: None SVE:  Deferred  A/P- 31 y.o. admitted with:   Patient Active Hospital Problem List: Pyelonephritis complicating pregnancy (05/27/2012)   Assessment: Tachycardic--component secondary to pain/fever; ?BP; ?Dehydration vs early SIRS   Plan: Monitor closely; IVH; check O2 sats w/vitals; continue antibiotics, supportive care [redacted] weeks gestation of pregnancy (05/27/2012)   Assessment: no signs of PTL      Pregnancy Complications: ?preterm cervical changes--Progesterone vaginally  Preterm labor management: n/a Dating:  [redacted]w[redacted]d

## 2012-05-27 NOTE — Progress Notes (Signed)
EFM strip did not record initially due to pt being admitted in [facility] in Rm #5 and pt in Rm #4. FHR has been 160s.

## 2012-05-27 NOTE — Progress Notes (Signed)
Kim RN in antenatal called and report given. Pt to 158 via w/c

## 2012-05-27 NOTE — H&P (Signed)
Chief Complaint:  Back Pain, Fever and Headache          Cathy Nash is  30 y.o. G2P1001.  Patient's last menstrual period was 12/02/2011..  [redacted]w[redacted]d  She presents complaining of Back Pain, Fever and Headache . Onset is described as ongoing and has been present for  5 days. Pt was seen in MAU on 05/23/12, dx with muscle spasms and indigestion. Urine culture from that visit was positive and patient has been taking Keflex. Reports feeling hot tonight, believes she has a fever. Vomited twice at 6:30pm yesterday. Took percocet, flexeril and Keflex at ~ 7pm without improvement of pain.  Obstetrical/Gynecological History: OB History      Grav  Para  Term  Preterm  Abortions  TAB  SAB  Ect  Mult  Living     2  1  1   0  0  0  0  0  0  1        Past Medical History: Past Medical History   Diagnosis  Date   .  Asthma       Past Surgical History: Past Surgical History   Procedure  Date   .  No past surgeries       Family History: Family History   Problem  Relation  Age of Onset   .  Anesthesia problems  Neg Hx     .  Hypotension  Neg Hx     .  Malignant hyperthermia  Neg Hx     .  Pseudochol deficiency  Neg Hx     .  Other  Neg Hx       Social History: History   Substance Use Topics   .  Smoking status:  Never Smoker    .  Smokeless tobacco:  Not on file   .  Alcohol Use:  No     Allergies:   Allergies   Allergen  Reactions   .  Iohexol         Code: SOB, Desc: IMMEDIATELY SOB FOLLOWING IV INJECTION, SPOTTY HIVES, PT GIVEN BENADRYL AND EPI-ARS 08/22/09, Onset Date: 16109604        Prescriptions prior to admission   Medication  Sig  Dispense  Refill   .  cephALEXin (KEFLEX) 500 MG capsule  Take 1 capsule (500 mg total) by mouth 3 (three) times daily.   21 capsule   0   .  cyclobenzaprine (FLEXERIL) 5 MG tablet  Take 1 tablet (5 mg total) by mouth 3 (three) times daily as needed for muscle spasms.   30 tablet   0   .  oxyCODONE-acetaminophen (PERCOCET) 5-325 MG per  tablet  Take 1 tablet by mouth every 4 (four) hours as needed. For pain         .  Prenatal Vit-Fe Fumarate-FA (PRENATAL MULTIVITAMIN) TABS  Take 1 tablet by mouth daily.         .  progesterone 200 MG SUPP  Place 1 suppository (200 mg total) vaginally at bedtime.   30 each   0     Review of Systems - History obtained from the patient, + fever Respiratory ROS: no cough, shortness of breath, or wheezing Cardiovascular ROS: no chest pain or dyspnea on exertion Gastrointestinal ROS: no abdominal pain, change in bowel habits, or black or bloody stools, + vomiting Genito-Urinary ROS: no dysuria, trouble voiding, or hematuria negative for - vulvar/vaginal symptoms Back: right-sided flank pain Neurological ROS: no TIA or stroke symptoms  Physical Exam    Blood pressure 114/70, pulse 120, temperature 98.7 F (37.1 C), temperature source Oral, height 5' 2.75" (1.594 m), weight 129 lb 2 oz (58.571 kg), last menstrual period 12/02/2011, SpO2 99.00%.  General: General appearance - alert, well appearing, and in no distress, oriented to person, place, and time and uncomfortable appearing Mental status - alert, oriented to person, place, and time, normal mood, behavior, speech, dress, motor activity, and thought processes Lymphatics - no palpable lymphadenopathy, no hepatosplenomegaly Lungs: CTAB A&P Heart: RRR, no murmur or bruits Abdomen - gravid, non tender, right CVAT, exquisite   Back exam - full range of motion, no tenderness, palpable spasm or pain on motion, warm to touch Extremities - peripheral pulses normal, no pedal edema, no clubbing or cyanosis, no pedal edema noted Focused Gynecological Exam: examination not indicated FHR: 155, mod variability Cat I for gest age Toco: no contractions  Labs: Recent Results (from the past 24 hour(s))   CBC     Collection Time     05/27/12  1:30 AM       Component  Value  Range     WBC  12.7 (*)  4.0 - 10.5 K/uL     RBC  3.33 (*)  3.87 - 5.11  MIL/uL     Hemoglobin  10.6 (*)  12.0 - 15.0 g/dL     HCT  96.0 (*)  45.4 - 46.0 %     MCV  91.0   78.0 - 100.0 fL     MCH  31.8   26.0 - 34.0 pg     MCHC  35.0   30.0 - 36.0 g/dL     RDW  09.8   11.9 - 15.5 %     Platelets  235   150 - 400 K/uL   DIFFERENTIAL     Collection Time     05/27/12  1:30 AM       Component  Value  Range     Neutrophils Relative  84 (*)  43 - 77 %     Neutro Abs  10.6 (*)  1.7 - 7.7 K/uL     Lymphocytes Relative  6 (*)  12 - 46 %     Lymphs Abs  0.8   0.7 - 4.0 K/uL     Monocytes Relative  10   3 - 12 %     Monocytes Absolute  1.3 (*)  0.1 - 1.0 K/uL     Eosinophils Relative  0   0 - 5 %     Eosinophils Absolute  0.0   0.0 - 0.7 K/uL     Basophils Relative  0   0 - 1 %     Basophils Absolute  0.0   0.0 - 0.1 K/uL       Urine culture  Status: Final result MyChart: Not Released                 Component   Value       Specimen Description   URINE, CLEAN CATCH       Special Requests   NONE       Culture Setup Time   [id number]       Colony Count   75,000 COLONIES/ML       Culture   ESCHERICHIA COLI       Report Status   05/25/2012 FINAL       Organism ID, Bacteria   ESCHERICHIA COLI  Resulting Agency   [facility]          Culture & Susceptibility       ESCHERICHIA COLI             Antibiotic   Sensitivity   Microscan   Status         AMPICILLIN   Sensitive   <=2   Final         Method:   MIC         CEFAZOLIN   Sensitive   <=4   Final         Method:   MIC         CEFTRIAXONE   Sensitive   <=1   Final         Method:   MIC         CIPROFLOXACIN   Sensitive   <=0.25   Final         Method:   MIC         GENTAMICIN   Sensitive   <=1   Final         Method:   MIC         LEVOFLOXACIN   Sensitive   <=0.12   Final         Method:   MIC         NITROFURANTOIN   Sensitive   32   Final         Method:   MIC         PIP/TAZO   Sensitive   <=4   Final         Method:   MIC         TOBRAMYCIN   Sensitive   <=1   Final         Method:   MIC          TRIMETH/SULFA   Sensitive   <=20   Final         Method:   MIC           Comments   ESCHERICHIA COLI (MIC)            ESCHERICHIA COLI      Imaging Studies:  US Renal  05/23/2012  *RADIOLOGY REPORT*  Clinical Data: Hematuria, back pain, [redacted] weeks pregnant.  RENAL/URINARY TRACT ULTRASOUND COMPLETE  Comparison:  01/28/2010 CT  Findings:  Right Kidney:  Normal sonographic appearance.  Measures 10.3 cm. No hydronephrosis.  Left Kidney:  Normal sonographic appearance.  Measures 10.3 cm.  No hydronephrosis.  Bladder:  Nondistended.  IMPRESSION: Normal sonographic appearance to the kidneys.  Original Report Authenticated By: Waneta Martins, M.D.      Assessment: Right Pyleonephritis  Plan: Admit to ante IV ABX and pain management

## 2012-05-28 LAB — APTT: aPTT: 26 seconds (ref 24–37)

## 2012-05-28 MED ORDER — PROGESTERONE 200 MG VA SUPP
200.0000 mg | Freq: Every day | VAGINAL | Status: DC
  Administered 2012-05-28 – 2012-05-29 (×2): 200 mg via VAGINAL
  Filled 2012-05-28 (×2): qty 1

## 2012-05-28 MED ORDER — OXYCODONE-ACETAMINOPHEN 5-325 MG PO TABS
2.0000 | ORAL_TABLET | ORAL | Status: DC | PRN
  Administered 2012-05-28 – 2012-05-29 (×2): 2 via ORAL
  Filled 2012-05-28 (×2): qty 2

## 2012-05-28 NOTE — Progress Notes (Signed)
Patient ID: Cathy Nash, female   DOB: 22-Nov-1981, 31 y.o.   MRN: [id number]  highest temp 102 last night  is afebrile this morning Still has severe right CVA tenderness Output good Continue present therapy

## 2012-05-28 NOTE — Progress Notes (Signed)
Ur chart review completed.  

## 2012-05-29 LAB — PREPARE RBC (CROSSMATCH)

## 2012-05-29 NOTE — Progress Notes (Signed)
Patient ID: Cathy Nash, female   DOB: 1981/05/15, 31 y.o.   MRN: [id number] Patient afebrile greater than 24 hours Her CVA tenderness is less than it was yesterday her vital signs are normal she'll be discharged tomorrow

## 2012-05-30 NOTE — Discharge Summary (Signed)
The patient's a 31 year old gravida 2 para 0101 at [redacted] weeks pregnant who was admitted 4 days ago him a temp 100 and right CVA tenderness and a positive urine she was treated with Rocephin 1 g IV every 12 hours for pyelonephritis she's gotten IV Rocephin for 4 days her symptoms are much improved CVA tenderness let us and her temp has been afebrile for greater than 24 hours she was discharged today on ampicillin 500 by mouth every 6 hours for 5 days and Tylox for pain to see me in 2 weeks

## 2012-05-30 NOTE — Progress Notes (Signed)
[facility] Department of Clinical Social Work Documentation of Interpretation   I assisted ___Sandra  RN________________ with interpretation of ____discharge__________________ for this patient.

## 2012-05-30 NOTE — Discharge Instructions (Signed)
Discharge instructions   You can wash your hair  Shower  Eat what you want  Drink what you want  See me in 6 weeks  Your ankles are going to swell more in the next 2 weeks than when pregnant  No sex for 6 weeks   MARSHALL,BERNARD A, MD 05/30/2012   Pielonefritis - Adultos  (Pyelonephritis, Adult)  La pielonefritis es una infeccin del rin. Hay dos tipos principales de pielonefritis:   Una infeccin que se inicia rpidamente sin sntomas previos (pielonefritis [place]).   Infecciones que persisten por un largo perodo (pielonefritis crnica).  CAUSAS  Hay dos causas principales:   Las bacterias viajan desde la vejiga al rin. Este problema aparece especialmente en mujeres embarazadas. La orina en la vejiga puede infectarse por diferentes causas, por ejemplo:   Inflamacin de la prstata (prostatitis).   Durante las [employer].   Infeccin en la vejiga (cistitis).   Las bacterias viajan desde el torrente sanguneo hacia el tejido del rin.  Las enfermedades que aumentan el riesgo son:   Diabetes.   Clculos renales o en la vescula.   Cncer.   Un catter colocado en la vejiga.   Otras anormalidades del rin o de Engineer, mining.  SNTOMAS   Dolor abdominal.   Dolor en la zona del costado o flanco.   Grant Ruts.   Escalofros.   Malestar estomacal.   Sangre en la orina Larose Kells).   Necesidad frecuente de orinar   Necesidad intensa o persistente de Geographical information systems officer.   Sensacin de ardor o pinchazos al [employer].  DIAGNSTICO  El mdico diagnosticar una infeccin en su rin basndose en los sntomas. Tambin tomar [place].  TRATAMIENTO  Generalmente el tratamiento depende de la gravedad de la infeccin.   Si la infeccin es leve y se diagnostica a tiempo, el mdico lo tratar con antibiticos por va oral y lo dejar irse a su casa.   Si la infeccin es ms grave, la bacteria podra haber ingresado al torrente sanguneo.  Esto requerir antibiticos por va intravenosa y Administrator, arts en el hospital. Los sntomas pueden incluir:   Fiebre alta.   Dolor intenso en un costado del cuerpo.   Escalofros   An despus de Financial risk analyst hospital, el mdico podr indicarle antibiticos por va oral durante cierto perodo de [place].   Podr prescribirle otros tratamientos segn la causa de la infeccin.  INSTRUCCIONES PARA EL CUIDADO EN EL HOGAR   Tome los antibiticos como se le indic. Tmelos todos, aunque se sienta mejor.   Concurra para Education officer, environmental un control y asegurarse de que la infeccin ha desaparecido.   Debe ingerir gran cantidad de lquido para mantener la orina de tono claro o color amarillo plido.   Tome medicamentos para la vejiga si siente urgencia para Geographical information systems officer o lo hace con mucha frecuencia.  SOLICITE ATENCIN MDICA DE INMEDIATO SI:   Tiene fiebre.   No puede tomar los antibiticos ni ingerir lquidos.   Comienza a sentir escalofros   Siente debilidad extrema o se desmaya.   No mejora despus de 2 das de [place].  ASEGRESE DE QUE:   Comprende estas instrucciones.   Controlar su enfermedad.   Solicitar ayuda de inmediato si no mejora o si empeora.  Document Released: 09/07/2005 Document Revised: 11/17/2011 [facility] Patient Information 2012 [place], [place].

## 2012-05-31 LAB — TYPE AND SCREEN
ABO/RH(D): O POS
Unit division: 0

## 2012-07-16 ENCOUNTER — Encounter (HOSPITAL_COMMUNITY): Payer: Self-pay | Admitting: *Deleted

## 2012-07-16 ENCOUNTER — Inpatient Hospital Stay (HOSPITAL_COMMUNITY)
Admission: AD | Admit: 2012-07-16 | Discharge: 2012-07-18 | DRG: 781 | Disposition: A | Discharge status: Home / Self Care | Payer: Medicaid Other | Source: Ambulatory Visit | Attending: Obstetrics | Admitting: Obstetrics

## 2012-07-16 DIAGNOSIS — O479 False labor, unspecified: Secondary | ICD-10-CM

## 2012-07-16 DIAGNOSIS — O2300 Infections of kidney in pregnancy, unspecified trimester: Secondary | ICD-10-CM

## 2012-07-16 DIAGNOSIS — Z3A25 25 weeks gestation of pregnancy: Secondary | ICD-10-CM

## 2012-07-16 DIAGNOSIS — M545 Low back pain, unspecified: Secondary | ICD-10-CM | POA: Diagnosis present

## 2012-07-16 DIAGNOSIS — O99891 Other specified diseases and conditions complicating pregnancy: Principal | ICD-10-CM | POA: Diagnosis present

## 2012-07-16 DIAGNOSIS — R109 Unspecified abdominal pain: Secondary | ICD-10-CM | POA: Diagnosis present

## 2012-07-16 DIAGNOSIS — O4700 False labor before 37 completed weeks of gestation, unspecified trimester: Secondary | ICD-10-CM | POA: Diagnosis present

## 2012-07-16 LAB — URINALYSIS, ROUTINE W REFLEX MICROSCOPIC
Nitrite: NEGATIVE
Specific Gravity, Urine: <1.005 — ABNORMAL LOW (ref 1.005–1.030)
Urobilinogen, UA: 0.2 mg/dL (ref 0.0–1.0)
pH: 6 (ref 5.0–8.0)

## 2012-07-16 LAB — URINE MICROSCOPIC-ADD ON

## 2012-07-16 NOTE — MAU Note (Signed)
Per interpreter, pt reports pain in hips that radiates to left lower abd x 24 hours. dysuria

## 2012-07-16 NOTE — MAU Note (Signed)
Pt has pain in her mid to low back that started yesterday, she took 10mg  flexeril that helped.  She woke with a fever to the touch and the back didn't start until 2000.

## 2012-07-17 ENCOUNTER — Encounter (HOSPITAL_COMMUNITY): Payer: Self-pay | Admitting: Family

## 2012-07-17 LAB — TYPE AND SCREEN
ABO/RH(D): O POS
Antibody Screen: NEGATIVE

## 2012-07-17 MED ORDER — ACETAMINOPHEN 325 MG PO TABS
650.0000 mg | ORAL_TABLET | ORAL | Status: DC | PRN
  Administered 2012-07-17 (×2): 650 mg via ORAL
  Filled 2012-07-17 (×2): qty 2

## 2012-07-17 MED ORDER — MAGNESIUM SULFATE BOLUS VIA INFUSION
4.0000 g | Freq: Once | INTRAVENOUS | Status: AC
  Administered 2012-07-17: 4 g via INTRAVENOUS
  Filled 2012-07-17: qty 500

## 2012-07-17 MED ORDER — SODIUM CHLORIDE 0.9 % IV SOLN
250.0000 mg | Freq: Four times a day (QID) | INTRAVENOUS | Status: AC
  Administered 2012-07-17 (×3): 250 mg via INTRAVENOUS
  Filled 2012-07-17 (×4): qty 250

## 2012-07-17 MED ORDER — SODIUM CHLORIDE 0.9 % IV SOLN
2.0000 g | Freq: Four times a day (QID) | INTRAVENOUS | Status: AC
  Administered 2012-07-17 – 2012-07-18 (×4): 2 g via INTRAVENOUS
  Filled 2012-07-17 (×4): qty 2000

## 2012-07-17 MED ORDER — BETAMETHASONE SOD PHOS & ACET 6 (3-3) MG/ML IJ SUSP: 12.0000 mg | Freq: Once | INTRAMUSCULAR | Status: DC

## 2012-07-17 MED ORDER — DOCUSATE SODIUM 100 MG PO CAPS
100.0000 mg | ORAL_CAPSULE | Freq: Every day | ORAL | Status: DC
  Administered 2012-07-17: 100 mg via ORAL
  Filled 2012-07-17: qty 1

## 2012-07-17 MED ORDER — TERBUTALINE SULFATE 1 MG/ML IJ SOLN
0.2500 mg | Freq: Once | INTRAMUSCULAR | Status: AC
  Administered 2012-07-17: 0.25 mg via SUBCUTANEOUS
  Filled 2012-07-17: qty 1

## 2012-07-17 MED ORDER — LACTATED RINGERS IV SOLN
INTRAVENOUS | Status: DC
  Administered 2012-07-17 (×2): via INTRAVENOUS

## 2012-07-17 MED ORDER — BETAMETHASONE SOD PHOS & ACET 6 (3-3) MG/ML IJ SUSP
12.0000 mg | Freq: Once | INTRAMUSCULAR | Status: AC
  Administered 2012-07-18: 12 mg via INTRAMUSCULAR
  Filled 2012-07-17: qty 2

## 2012-07-17 MED ORDER — PRENATAL MULTIVITAMIN CH
1.0000 | ORAL_TABLET | Freq: Every day | ORAL | Status: DC
  Administered 2012-07-17: 1 via ORAL
  Filled 2012-07-17: qty 1

## 2012-07-17 MED ORDER — LACTATED RINGERS IV SOLN
Freq: Once | INTRAVENOUS | Status: AC
  Administered 2012-07-17: 02:00:00 via INTRAVENOUS

## 2012-07-17 MED ORDER — PHENAZOPYRIDINE HCL 200 MG PO TABS
200.0000 mg | ORAL_TABLET | Freq: Three times a day (TID) | ORAL | Status: DC
  Administered 2012-07-17: 200 mg via ORAL
  Filled 2012-07-17 (×2): qty 1

## 2012-07-17 MED ORDER — MAGNESIUM SULFATE 40 G IN LACTATED RINGERS - SIMPLE
2.0000 g/h | INTRAVENOUS | Status: DC
  Filled 2012-07-17: qty 500

## 2012-07-17 MED ORDER — BETAMETHASONE SOD PHOS & ACET 6 (3-3) MG/ML IJ SUSP
12.0000 mg | Freq: Once | INTRAMUSCULAR | Status: AC
  Administered 2012-07-17: 12 mg via INTRAMUSCULAR
  Filled 2012-07-17: qty 2

## 2012-07-17 MED ORDER — OXYCODONE-ACETAMINOPHEN 5-325 MG PO TABS
1.0000 | ORAL_TABLET | ORAL | Status: DC | PRN
  Administered 2012-07-17: 2 via ORAL
  Filled 2012-07-17: qty 2

## 2012-07-17 MED ORDER — ERYTHROMYCIN BASE 250 MG PO TBEC
250.0000 mg | DELAYED_RELEASE_TABLET | Freq: Four times a day (QID) | ORAL | Status: DC
  Administered 2012-07-18: 250 mg via ORAL
  Filled 2012-07-17 (×2): qty 1

## 2012-07-17 MED ORDER — CYCLOBENZAPRINE HCL 10 MG PO TABS: 10.0000 mg | ORAL_TABLET | Freq: Three times a day (TID) | ORAL | Status: DC | PRN

## 2012-07-17 MED ORDER — ZOLPIDEM TARTRATE 5 MG PO TABS
5.0000 mg | ORAL_TABLET | Freq: Every evening | ORAL | Status: DC | PRN
  Administered 2012-07-18: 5 mg via ORAL
  Filled 2012-07-17: qty 1

## 2012-07-17 MED ORDER — CALCIUM CARBONATE ANTACID 500 MG PO CHEW: 2.0000 | CHEWABLE_TABLET | ORAL | Status: DC | PRN

## 2012-07-17 MED ORDER — SODIUM CHLORIDE 0.9 % IJ SOLN
3.0000 mL | Freq: Two times a day (BID) | INTRAMUSCULAR | Status: DC
  Administered 2012-07-17: 3 mL via INTRAVENOUS

## 2012-07-17 MED ORDER — AMOXICILLIN 500 MG PO CAPS
500.0000 mg | ORAL_CAPSULE | Freq: Three times a day (TID) | ORAL | Status: DC
  Administered 2012-07-18: 500 mg via ORAL
  Filled 2012-07-17 (×2): qty 1

## 2012-07-17 MED ORDER — SODIUM CHLORIDE 0.9 % IV SOLN
500.0000 mg | Freq: Once | INTRAVENOUS | Status: AC
  Administered 2012-07-17: 500 mg via INTRAVENOUS
  Filled 2012-07-17: qty 500

## 2012-07-17 NOTE — Progress Notes (Signed)
Orders to d/c magnesium  Sulfate, and change to po antibiotics after 4 doses

## 2012-07-17 NOTE — H&P (Signed)
This this is Dr. Francoise Ceo dictating the history and physical on  Cathy Nash she is a 32 year old gravida 2 para 0[facility] 09/07/2012 she's 32 weeks and some days the patient was previously hospitalized last month for pyelonephritis and she came back last night complaining of low abdominal pain and lower back pain had a positive urine and on exam question and the patient she did not finish taking her medication when she had her UTI her cervix is 2 cm she was given terbutaline and times to stop her contractions but they persisted her fetal fibronectin was positive she was started on betamethasone 12.5 mg IM she was also started on ampicillin 2 g IV every 6 hours and erythromycin 500 IV and initially and then 250 every 6 hours see she's been admitted she's also been on magnesium sulfate 4 g loading and 2 g of now and she is just having some irritability was explained to the patient that she is Dr. Bary Castilla taking her course of antibiotics were no meds prescribed Past medical history patient had a previous premature baby and she was hospitalized last month for pyelonephritis Past surgical history negative Social history negative Family history negative System review noncontributory Physical exam revealed a well-developed female in no acute distress HEENT negative Lungs clear to P&A Heart regular rhythm no murmurs no gallops Abdomen 30 at 32 weeks size and soft No CVA tenderness and Pelvic as described above extremities negative a

## 2012-07-17 NOTE — Progress Notes (Signed)
UR Chart review completed.  

## 2012-07-17 NOTE — MAU Provider Note (Signed)
History     CSN: [id number]  Arrival date and time: 07/16/12 2206   None     Chief Complaint  Patient presents with  . Back Pain   HPI  Pt is here with report of back pain and lower pelvic pain that increased in intensity yesterday.  Denies vaginal bleeding or leaking of fluid.  +dysuria.  Reports having dark brown mucus/discharge yesterday. No report of contractions.    Past Medical History  Diagnosis Date  . Asthma   . Preterm labor     1st pregnancy d/t pre-e  . Pregnancy induced hypertension     previous pregnancy    Past Surgical History  Procedure Date  . No past surgeries     Family History  Problem Relation Age of Onset  . Anesthesia problems Neg Hx   . Hypotension Neg Hx   . Malignant hyperthermia Neg Hx   . Pseudochol deficiency Neg Hx   . Other Neg Hx   . Hypertension Mother   . Asthma Father     History  Substance Use Topics  . Smoking status: Never Smoker   . Smokeless tobacco: Never Used  . Alcohol Use: No     a little when not pregnant    Allergies:  Allergies  Allergen Reactions  . Iohexol      Code: SOB, Desc: IMMEDIATELY SOB FOLLOWING IV INJECTION, SPOTTY HIVES, PT GIVEN BENADRYL AND EPI-ARS 08/22/09, Onset Date: [id number]     Prescriptions prior to admission  Medication Sig Dispense Refill  . cyclobenzaprine (FLEXERIL) 10 MG tablet Take 10 mg by mouth 3 (three) times daily as needed.        Review of Systems  Gastrointestinal: Positive for abdominal pain (pelvic pain).  Genitourinary:       Brown vaginal discharge  Musculoskeletal: Positive for back pain.  All other systems reviewed and are negative.   Physical Exam   Blood pressure 117/71, pulse 85, temperature 97.1 F (36.2 C), temperature source Oral, resp. rate 20, height 5' 2.5" (1.588 m), weight 137 lb (62.143 kg), last menstrual period 12/02/2011, SpO2 100.00%.  Physical Exam  Constitutional: She is oriented to person, place, and time. She appears well-developed and  well-nourished. No distress.  HENT:  Head: Normocephalic.  Neck: Normal range of motion. Neck supple.  Cardiovascular: Normal rate, regular rhythm and normal heart sounds.   Respiratory: Effort normal and breath sounds normal.  GI: Soft. There is no tenderness. There is CVA tenderness (left).  Genitourinary: No bleeding around the vagina. Vaginal discharge (mucusy) found.       1-2/50/-2  Musculoskeletal: Normal range of motion. She exhibits no edema.  Neurological: She is alert and oriented to person, place, and time.  Skin: Skin is warm and dry.   FHR 130's, +accels Toco 3-10  MAU Course  Procedures Results for orders placed during the hospital encounter of 07/16/12 (from the past 24 hour(s))  URINALYSIS, ROUTINE W REFLEX MICROSCOPIC     Status: Abnormal   Collection Time   07/16/12 11:10 PM      Component Value Range   Color, Urine YELLOW  YELLOW   APPearance CLEAR  CLEAR   Specific Gravity, Urine <1.005 (*) 1.005 - 1.030   pH 6.0  5.0 - 8.0   Glucose, UA NEGATIVE  NEGATIVE mg/dL   Hgb urine dipstick SMALL (*) NEGATIVE   Bilirubin Urine NEGATIVE  NEGATIVE   Ketones, ur NEGATIVE  NEGATIVE mg/dL   Protein, ur NEGATIVE  NEGATIVE mg/dL  Urobilinogen, UA 0.2  0.0 - 1.0 mg/dL   Nitrite NEGATIVE  NEGATIVE   Leukocytes, UA LARGE (*) NEGATIVE  URINE MICROSCOPIC-ADD ON     Status: Normal   Collection Time   07/16/12 11:10 PM      Component Value Range   Squamous Epithelial / LPF RARE  RARE   WBC, UA 3-6  <3 WBC/hpf   RBC / HPF 0-2  <3 RBC/hpf  FETAL FIBRONECTIN     Status: Abnormal   Collection Time   07/17/12 12:45 AM      Component Value Range   Fetal Fibronectin POSITIVE (*) NEGATIVE   I Liter LR Terbutaline 0.25mg  SQ Betamethasone 12 mg IM  Consulted with Dr. Gaynell Face > reviewed HPI/OB history/exam/+fetal fibronectin>orders obtained, DC home with return for second dose of BMZ tomorrow.  Assessment and Plan  Preterm Contractions UTI  Plan: DC to home Keflex 500  TID Urine culture Preterm labor precautions Return in 24 hours for second dose of BMZ MUHAMMAD,Bern Fare  Pt reexamine prior to discharge, cervix 2-3/80/-2 > admit to ante > orders obtained from Dr. Gaynell Face  [facility] 07/17/2012, 12:05 AM

## 2012-07-18 NOTE — Discharge Summary (Signed)
  Second admission in a month for this patient was admitted with pyelonephritis last month she's not 32 weeks and was admitted with back pain abdominal pain contracting received magnesium sulfate and also ampicillin 2 g IV every 6 hours she had not finished taking her medication after her last hospitalization because she was feeling better she received betamethasone 12.5 and x2 doses and she has no complaints at the present time she'll be discharged today to see me for regular visit next week for discharge on ampicillin 500 by mouth every 6 hours for 10 days Pyridium 200 mg 3 times a day and Tylox for pain so discharge diagnosis is UTI at 32 weeks

## 2012-07-18 NOTE — Progress Notes (Signed)
Patient ID: Cathy Nash, female   DOB: 1981/03/03, 31 y.o.   MRN: [id number] Vital signs normal has occasional back pain she was discharged today on her ampicillin 500 by mouth every 6 hours for 10 days and instructed to take medication for 10 days she is also on Pyridium 203 times a day and Tylox for pain she has no complaints at this time she received her betamethasone x2 doses and she's been followed in the office

## 2012-07-24 ENCOUNTER — Encounter (HOSPITAL_COMMUNITY): Payer: Self-pay

## 2012-07-24 ENCOUNTER — Inpatient Hospital Stay (HOSPITAL_COMMUNITY)
Admission: AD | Admit: 2012-07-24 | Discharge: 2012-07-24 | Disposition: A | Discharge status: Home / Self Care | Payer: MEDICAID | Source: Ambulatory Visit | Attending: Obstetrics | Admitting: Obstetrics

## 2012-07-24 DIAGNOSIS — O228X9 Other venous complications in pregnancy, unspecified trimester: Secondary | ICD-10-CM | POA: Insufficient documentation

## 2012-07-24 DIAGNOSIS — O4700 False labor before 37 completed weeks of gestation, unspecified trimester: Secondary | ICD-10-CM | POA: Insufficient documentation

## 2012-07-24 DIAGNOSIS — K649 Unspecified hemorrhoids: Secondary | ICD-10-CM | POA: Insufficient documentation

## 2012-07-24 DIAGNOSIS — O2240 Hemorrhoids in pregnancy, unspecified trimester: Secondary | ICD-10-CM

## 2012-07-24 DIAGNOSIS — K5900 Constipation, unspecified: Secondary | ICD-10-CM

## 2012-07-24 MED ORDER — HYDROCORTISONE ACE-PRAMOXINE 1-1 % RE FOAM
1.0000 | Freq: Once | RECTAL | Status: AC
  Administered 2012-07-24: 1 via RECTAL
  Filled 2012-07-24: qty 10

## 2012-07-24 MED ORDER — FLEET ENEMA 7-19 GM/118ML RE ENEM
1.0000 | ENEMA | Freq: Once | RECTAL | Status: AC
  Administered 2012-07-24: 1 via RECTAL

## 2012-07-24 MED ORDER — HYDROCORTISONE ACE-PRAMOXINE 1-1 % RE FOAM: 1.0000 | Freq: Two times a day (BID) | RECTAL | Status: DC

## 2012-07-24 MED ORDER — LIDOCAINE HCL 2 % EX GEL
Freq: Once | CUTANEOUS | Status: AC
  Administered 2012-07-24: 10 via TOPICAL
  Filled 2012-07-24: qty 20

## 2012-07-24 NOTE — MAU Provider Note (Signed)
  History     CSN: [id number]  Arrival date and time: 07/24/12 1858   None     Chief Complaint  Patient presents with  . Contractions   HPI 31 y.o. G2P0101 at [redacted]w[redacted]d c/o contractions and vaginal pain, "feels like something is coming out". No bleeding or LOF. + constipation and urinary hesitancy. Seen in the office today and was given "something for contractions" but did not go to pharmacy to have prescription filled. Also c/o bleeding and pain with bowel movements, "bad hemorrhoids".    Past Medical History  Diagnosis Date  . Asthma   . Preterm labor     1st pregnancy d/t pre-e  . Pregnancy induced hypertension     previous pregnancy    Past Surgical History  Procedure Date  . No past surgeries     Family History  Problem Relation Age of Onset  . Anesthesia problems Neg Hx   . Hypotension Neg Hx   . Malignant hyperthermia Neg Hx   . Pseudochol deficiency Neg Hx   . Other Neg Hx   . Hypertension Mother   . Asthma Father     History  Substance Use Topics  . Smoking status: Never Smoker   . Smokeless tobacco: Never Used  . Alcohol Use: No     a little when not pregnant    Allergies:  Allergies  Allergen Reactions  . Iohexol      Code: SOB, Desc: IMMEDIATELY SOB FOLLOWING IV INJECTION, SPOTTY HIVES, PT GIVEN BENADRYL AND EPI-ARS 08/22/09, Onset Date: [id number]     Prescriptions prior to admission  Medication Sig Dispense Refill  . Prenatal Vit-Fe Fumarate-FA (PRENATAL MULTIVITAMIN) TABS Take 1 tablet by mouth daily.        Review of Systems  Constitutional: Negative.   Respiratory: Negative.   Cardiovascular: Negative.   Gastrointestinal: Positive for constipation and blood in stool. Negative for nausea, vomiting, abdominal pain and diarrhea.  Genitourinary: Negative for dysuria, urgency, frequency, hematuria and flank pain.       Negative for vaginal bleeding  Musculoskeletal: Negative.   Neurological: Negative.   Psychiatric/Behavioral: Negative.     Physical Exam   Blood pressure 125/80, pulse 88, temperature 97.6 F (36.4 C), resp. rate 18, height 5\' 2"  (1.575 m), weight 135 lb 6.4 oz (61.417 kg), last menstrual period 12/02/2011, SpO2 100.00%.  Physical Exam  Nursing note and vitals reviewed. Constitutional: She is oriented to person, place, and time. She appears well-developed and well-nourished. No distress.  Cardiovascular: Normal rate.   Respiratory: Effort normal.  GI: Soft. There is no tenderness.  Genitourinary:       SVE: 2/50%/high/medium consistency Large stool palpable through posterior wall of vagina  Neurological: She is alert and oriented to person, place, and time.  Skin: Skin is warm and dry.  Psychiatric: She has a normal mood and affect.    MAU Course  Procedures  EFM: reactive, TOCO: occasional contraction  Fleets enema for constipation in MAU Care assumed by [facility], NP  [facility] 07/24/2012, 8:02 PM   Results with Fleet enema, patient c/o hemorrhoids Lidocaine jelly to hemorrhoids Proctofoam  Discussed with patient constipation and diet, stool softener and follow up plan of care. Hospital translator used. Patient voices understanding.  @ 21:10 Consult with Dr. Gaynell Face, will d/c patient home to follow up in the office.

## 2012-07-24 NOTE — MAU Note (Signed)
"  i feel like something is coming out"

## 2012-08-13 ENCOUNTER — Inpatient Hospital Stay (HOSPITAL_COMMUNITY)
Admission: AD | Admit: 2012-08-13 | Discharge: 2012-08-13 | Disposition: A | Discharge status: Home / Self Care | Payer: MEDICAID | Source: Ambulatory Visit | Attending: Obstetrics | Admitting: Obstetrics

## 2012-08-13 ENCOUNTER — Encounter (HOSPITAL_COMMUNITY): Payer: Self-pay

## 2012-08-13 DIAGNOSIS — Z3A25 25 weeks gestation of pregnancy: Secondary | ICD-10-CM

## 2012-08-13 DIAGNOSIS — O368190 Decreased fetal movements, unspecified trimester, not applicable or unspecified: Secondary | ICD-10-CM | POA: Insufficient documentation

## 2012-08-13 DIAGNOSIS — O4700 False labor before 37 completed weeks of gestation, unspecified trimester: Secondary | ICD-10-CM | POA: Insufficient documentation

## 2012-08-13 DIAGNOSIS — O2300 Infections of kidney in pregnancy, unspecified trimester: Secondary | ICD-10-CM

## 2012-08-13 LAB — URINE MICROSCOPIC-ADD ON

## 2012-08-13 LAB — URINALYSIS, ROUTINE W REFLEX MICROSCOPIC
Bilirubin Urine: NEGATIVE
Nitrite: NEGATIVE
Protein, ur: NEGATIVE mg/dL
Specific Gravity, Urine: <1.005 — ABNORMAL LOW (ref 1.005–1.030)
Urobilinogen, UA: 0.2 mg/dL (ref 0.0–1.0)

## 2012-08-13 MED ORDER — FLUCONAZOLE 150 MG PO TABS
150.0000 mg | ORAL_TABLET | ORAL | Status: AC
  Administered 2012-08-13: 150 mg via ORAL
  Filled 2012-08-13: qty 1

## 2012-08-13 NOTE — MAU Note (Addendum)
Pt states via Cathy Nash, spanish interpreter that she noted orangish clear vaginal discharge this am around 0800, noted light spotting this am as well, cervix was 2cm last week. Ctx's q58minutes. Pt states mid August changed medication from procardia, was also taking suppositories.

## 2012-08-13 NOTE — MAU Note (Signed)
Dr. Clearance Coots notified pt in MAU for c/o ctx's q42minutes apart, and notes vaginal discharge. 2 u/c's in 1 hour, fern slide negative, yeast noted, cervix 2.5/thick/-3 vertex intact, orders to give 150mg  diflucan and d/c home with labor precautions.

## 2012-08-13 NOTE — MAU Note (Signed)
Patient states she is having constant contractions with a bloody discharge. States she has not felt the baby move today. Fetal heart in triage 156 and movement felt.

## 2012-08-13 NOTE — Progress Notes (Signed)
[facility] Department of Clinical Social Work Documentation of Interpretation   I assisted __Sara RN_________________ with interpretation of _____questions_________________ for this patient. 

## 2012-08-17 ENCOUNTER — Inpatient Hospital Stay (HOSPITAL_COMMUNITY)
Admission: AD | Admit: 2012-08-17 | Discharge: 2012-08-19 | DRG: 775 | Disposition: A | Discharge status: Home / Self Care | Payer: Medicaid Other | Source: Ambulatory Visit | Attending: Obstetrics | Admitting: Obstetrics

## 2012-08-17 ENCOUNTER — Encounter (HOSPITAL_COMMUNITY): Payer: Self-pay | Admitting: *Deleted

## 2012-08-17 DIAGNOSIS — O9903 Anemia complicating the puerperium: Principal | ICD-10-CM | POA: Diagnosis not present

## 2012-08-17 DIAGNOSIS — O99019 Anemia complicating pregnancy, unspecified trimester: Secondary | ICD-10-CM | POA: Clinically undetermined

## 2012-08-17 DIAGNOSIS — O2300 Infections of kidney in pregnancy, unspecified trimester: Secondary | ICD-10-CM

## 2012-08-17 DIAGNOSIS — Z3A25 25 weeks gestation of pregnancy: Secondary | ICD-10-CM

## 2012-08-17 DIAGNOSIS — D649 Anemia, unspecified: Secondary | ICD-10-CM | POA: Diagnosis not present

## 2012-08-17 HISTORY — DX: Tubulo-interstitial nephritis, not specified as acute or chronic: N12

## 2012-08-17 LAB — CBC
Hemoglobin: 10.6 g/dL — ABNORMAL LOW (ref 12.0–15.0)
MCH: 29.3 pg (ref 26.0–34.0)
MCHC: 33.1 g/dL (ref 30.0–36.0)
MCV: 88.4 fL (ref 78.0–100.0)
Platelets: 270 10*3/uL (ref 150–400)

## 2012-08-17 LAB — POCT FERN TEST: Fern Test: POSITIVE

## 2012-08-17 LAB — TYPE AND SCREEN: Antibody Screen: NEGATIVE

## 2012-08-17 MED ORDER — PRENATAL MULTIVITAMIN CH
1.0000 | ORAL_TABLET | Freq: Every day | ORAL | Status: DC
  Administered 2012-08-17 – 2012-08-19 (×3): 1 via ORAL
  Filled 2012-08-17 (×3): qty 1

## 2012-08-17 MED ORDER — LANOLIN HYDROUS EX OINT: TOPICAL_OINTMENT | CUTANEOUS | Status: DC | PRN

## 2012-08-17 MED ORDER — SENNOSIDES-DOCUSATE SODIUM 8.6-50 MG PO TABS
2.0000 | ORAL_TABLET | Freq: Every day | ORAL | Status: DC
  Administered 2012-08-17 – 2012-08-18 (×2): 2 via ORAL

## 2012-08-17 MED ORDER — SIMETHICONE 80 MG PO CHEW: 80.0000 mg | CHEWABLE_TABLET | ORAL | Status: DC | PRN

## 2012-08-17 MED ORDER — FLEET ENEMA 7-19 GM/118ML RE ENEM: 1.0000 | ENEMA | RECTAL | Status: DC | PRN

## 2012-08-17 MED ORDER — ONDANSETRON HCL 4 MG PO TABS: 4.0000 mg | ORAL_TABLET | ORAL | Status: DC | PRN

## 2012-08-17 MED ORDER — OXYTOCIN BOLUS FROM INFUSION
500.0000 mL | Freq: Once | INTRAVENOUS | Status: AC
  Administered 2012-08-17: 500 mL via INTRAVENOUS
  Filled 2012-08-17: qty 500

## 2012-08-17 MED ORDER — ONDANSETRON HCL 4 MG/2ML IJ SOLN: 4.0000 mg | Freq: Four times a day (QID) | INTRAMUSCULAR | Status: DC | PRN

## 2012-08-17 MED ORDER — BENZOCAINE-MENTHOL 20-0.5 % EX AERO: 1.0000 "application " | INHALATION_SPRAY | CUTANEOUS | Status: DC | PRN

## 2012-08-17 MED ORDER — DIBUCAINE 1 % RE OINT: 1.0000 "application " | TOPICAL_OINTMENT | RECTAL | Status: DC | PRN

## 2012-08-17 MED ORDER — TERBUTALINE SULFATE 1 MG/ML IJ SOLN: 0.2500 mg | Freq: Once | INTRAMUSCULAR | Status: DC | PRN

## 2012-08-17 MED ORDER — OXYTOCIN 40 UNITS IN LACTATED RINGERS INFUSION - SIMPLE MED: 62.5000 mL/h | INTRAVENOUS | Status: DC | PRN

## 2012-08-17 MED ORDER — OXYCODONE-ACETAMINOPHEN 5-325 MG PO TABS: 1.0000 | ORAL_TABLET | ORAL | Status: DC | PRN

## 2012-08-17 MED ORDER — ZOLPIDEM TARTRATE 5 MG PO TABS: 5.0000 mg | ORAL_TABLET | Freq: Every evening | ORAL | Status: DC | PRN

## 2012-08-17 MED ORDER — LIDOCAINE HCL (PF) 1 % IJ SOLN
30.0000 mL | INTRAMUSCULAR | Status: DC | PRN
  Filled 2012-08-17: qty 30

## 2012-08-17 MED ORDER — IBUPROFEN 600 MG PO TABS: 600.0000 mg | ORAL_TABLET | Freq: Four times a day (QID) | ORAL | Status: DC | PRN

## 2012-08-17 MED ORDER — DIPHENHYDRAMINE HCL 25 MG PO CAPS: 25.0000 mg | ORAL_CAPSULE | Freq: Four times a day (QID) | ORAL | Status: DC | PRN

## 2012-08-17 MED ORDER — IBUPROFEN 600 MG PO TABS
600.0000 mg | ORAL_TABLET | Freq: Four times a day (QID) | ORAL | Status: DC
  Administered 2012-08-17 – 2012-08-19 (×8): 600 mg via ORAL
  Filled 2012-08-17 (×8): qty 1

## 2012-08-17 MED ORDER — ACETAMINOPHEN 325 MG PO TABS: 650.0000 mg | ORAL_TABLET | ORAL | Status: DC | PRN

## 2012-08-17 MED ORDER — WITCH HAZEL-GLYCERIN EX PADS: 1.0000 "application " | MEDICATED_PAD | CUTANEOUS | Status: DC | PRN

## 2012-08-17 MED ORDER — LACTATED RINGERS IV SOLN: 500.0000 mL | INTRAVENOUS | Status: DC | PRN

## 2012-08-17 MED ORDER — CITRIC ACID-SODIUM CITRATE 334-500 MG/5ML PO SOLN: 30.0000 mL | ORAL | Status: DC | PRN

## 2012-08-17 MED ORDER — MEDROXYPROGESTERONE ACETATE 150 MG/ML IM SUSP
150.0000 mg | INTRAMUSCULAR | Status: DC | PRN
  Filled 2012-08-17: qty 1

## 2012-08-17 MED ORDER — OXYTOCIN 40 UNITS IN LACTATED RINGERS INFUSION - SIMPLE MED
62.5000 mL/h | Freq: Once | INTRAVENOUS | Status: AC
  Administered 2012-08-17: 62.5 mL/h via INTRAVENOUS
  Filled 2012-08-17: qty 1000

## 2012-08-17 MED ORDER — TETANUS-DIPHTH-ACELL PERTUSSIS 5-2.5-18.5 LF-MCG/0.5 IM SUSP
0.5000 mL | Freq: Once | INTRAMUSCULAR | Status: AC
  Administered 2012-08-18: 0.5 mL via INTRAMUSCULAR
  Filled 2012-08-17: qty 0.5

## 2012-08-17 MED ORDER — BUTORPHANOL TARTRATE 2 MG/ML IJ SOLN
2.0000 mg | INTRAMUSCULAR | Status: DC | PRN
  Administered 2012-08-17: 2 mg via INTRAVENOUS
  Filled 2012-08-17: qty 2

## 2012-08-17 MED ORDER — OXYTOCIN 40 UNITS IN LACTATED RINGERS INFUSION - SIMPLE MED: 2.0000 m[IU]/min | INTRAVENOUS | Status: DC

## 2012-08-17 MED ORDER — ONDANSETRON HCL 4 MG/2ML IJ SOLN: 4.0000 mg | INTRAMUSCULAR | Status: DC | PRN

## 2012-08-17 MED ORDER — LACTATED RINGERS IV SOLN
INTRAVENOUS | Status: DC
  Administered 2012-08-17: 125 mL/h via INTRAVENOUS

## 2012-08-17 MED ORDER — PNEUMOCOCCAL VAC POLYVALENT 25 MCG/0.5ML IJ INJ
0.5000 mL | INJECTION | INTRAMUSCULAR | Status: AC
  Administered 2012-08-18: 0.5 mL via INTRAMUSCULAR
  Filled 2012-08-17: qty 0.5

## 2012-08-17 NOTE — H&P (Signed)
Cathy Nash is a 31 y.o. female presenting for UC's. Maternal Medical History:  Reason for admission: Reason for admission: contractions.  31 yo G2 P1  EDC 09-07-12.  Presents with UC's.  Contractions: Onset was 6-12 hours ago.   Frequency: regular.    Fetal activity: Perceived fetal activity is normal.   Last perceived fetal movement was within the past hour.    Prenatal complications: no prenatal complications   OB History    Grav Para Term Preterm Abortions TAB SAB Ect Mult Living   2 1 0 1 0 0 0 0 0 1      Past Medical History  Diagnosis Date  . Asthma   . Preterm labor     1st pregnancy d/t pre-e  . Pregnancy induced hypertension     previous pregnancy  . Pyelonephritis     June 2013   Past Surgical History  Procedure Date  . No past surgeries    Family History: family history includes Asthma in her father and Hypertension in her mother.  There is no history of Anesthesia problems, and Hypotension, and Malignant hyperthermia, and Pseudochol deficiency, and Other, . Social History:  reports that she has never smoked. She has never used smokeless tobacco. She reports that she does not drink alcohol or use illicit drugs.   Prenatal Transfer Tool  Maternal Diabetes: No Genetic Screening: Declined Maternal Ultrasounds/Referrals: Normal Fetal Ultrasounds or other Referrals:  None Maternal Substance Abuse:  No Significant Maternal Medications:  Meds include: Other:  PNV Significant Maternal Lab Results:  None Other Comments:  None  Review of Systems  All other systems reviewed and are negative.    Dilation: 9 Effacement (%): 100 Station: 0 Exam by::  (Sowder, RNC) Blood pressure 109/70, pulse 72, temperature 98.1 F (36.7 C), temperature source Oral, resp. rate 20, height 5\' 2"  (1.575 m), weight 63.957 kg (141 lb), last menstrual period 12/02/2011. Maternal Exam:  Uterine Assessment: Contraction strength is firm.  Abdomen: Patient reports no abdominal  tenderness. Fetal presentation: vertex  Pelvis: adequate for delivery.   Cervix: Cervix evaluated by digital exam.     Physical Exam  Nursing note and vitals reviewed. Constitutional: She appears well-developed and well-nourished.  HENT:  Head: Normocephalic and atraumatic.  Eyes: Conjunctivae are normal. Pupils are equal, round, and reactive to light.  Neck: Normal range of motion. Neck supple.  Cardiovascular: Normal rate and regular rhythm.   Respiratory: Effort normal.  GI: Soft.  Musculoskeletal: Normal range of motion.    Prenatal labs: ABO, Rh: --/--/O POS (08/06 1610) Antibody: NEG (08/06 0520) Rubella: Immune (04/18 0000) RPR: Nonreactive (04/18 0000)  HBsAg: Negative (04/18 0000)  HIV: Non-reactive (04/18 0000)  GBS:     Assessment/Plan: 37 weeks.  Active labor.  Expectant.   HARPER,CHARLES A 08/17/2012, 4:45 PM

## 2012-08-17 NOTE — MAU Note (Addendum)
Brought from lobby via W/C for labor eval. Pt. crying out loud. Appears to be leaking fluid. States uc's began after SROM.

## 2012-08-17 NOTE — Progress Notes (Signed)
Cathy Nash is a 30 y.o. G2P0101 at [redacted]w[redacted]d by LMP admitted for active labor  Subjective:   Objective: BP 109/70  Pulse 72  Temp 98.1 F (36.7 C) (Oral)  Resp 20  Ht 5\' 2"  (1.575 m)  Wt 63.957 kg (141 lb)  BMI 25.79 kg/m2  LMP 12/02/2011      FHT:  FHR: 150 bpm, variability: moderate,  accelerations:  Present,  decelerations:  Absent UC:   regular, every 3 minutes SVE:   Dilation: 9 Effacement (%): 100 Station: 0 Exam by::  (Sowder, RNC)  Labs: Lab Results  Component Value Date   WBC 10.3 08/17/2012   HGB 10.6* 08/17/2012   HCT 32.0* 08/17/2012   MCV 88.4 08/17/2012   PLT 270 08/17/2012    Assessment / Plan: Spontaneous labor, progressing normally  Labor: Progressing normally Preeclampsia:  n/a Fetal Wellbeing:  Category I Pain Control:  Stadol I/D:  n/a Anticipated MOD:  NSVD  HARPER,CHARLES A 08/17/2012, 4:49 PM

## 2012-08-17 NOTE — Progress Notes (Signed)
Dr Tamela Oddi (covering for dr. Gaynell Face) notified of cervical change. Plans to no longer start pitocin.  Dr. Tamela Oddi scrubbed into surgery.  Dr. Clearance Coots updated on patient in case Dr. Tamela Oddi cannot attend delivery

## 2012-08-18 LAB — RPR: RPR Ser Ql: NONREACTIVE

## 2012-08-18 LAB — CBC
Hemoglobin: 9.4 g/dL — ABNORMAL LOW (ref 12.0–15.0)
MCH: 29.6 pg (ref 26.0–34.0)
RBC: 3.18 MIL/uL — ABNORMAL LOW (ref 3.87–5.11)
WBC: 10.4 10*3/uL (ref 4.0–10.5)

## 2012-08-18 NOTE — Progress Notes (Signed)
Patient ID: Malicia Blasdel, female   DOB: [birth year], 31 y.o.   MRN: [id number] Post Partum Day 1 S/P spontaneous vaginal RH status/Rubella reviewed.  Feeding: breast Subjective: No HA, SOB, CP, F/C, breast symptoms. Normal vaginal bleeding, no clots.     Objective: BP 99/67  Pulse 61  Temp 97.4 F (36.3 C) (Oral)  Resp 18  Ht 5\' 2"  (1.575 m)  Wt 63.957 kg (141 lb)  BMI 25.79 kg/m2  SpO2 99%  LMP 12/02/2011  Breastfeeding? Unknown   Physical Exam:  General: alert Lochia: appropriate Uterine Fundus: firm DVT Evaluation: No evidence of DVT seen on physical exam. Ext: No c/c/e  Basename 08/18/12 0500 08/17/12 1500  HGB 9.4* 10.6*  HCT 28.1* 32.0*      Assessment/Plan: 31 y.o.  PPD #1 .  normal postpartum exam Continue current postpartum care  Ambulate   LOS: 1 day   JACKSON-MOORE,Saida Lonon A 08/18/2012, 10:37 AM

## 2012-08-19 DIAGNOSIS — O99019 Anemia complicating pregnancy, unspecified trimester: Secondary | ICD-10-CM | POA: Clinically undetermined

## 2012-08-19 MED ORDER — MEDROXYPROGESTERONE ACETATE 150 MG/ML IM SUSP
150.0000 mg | Freq: Once | INTRAMUSCULAR | Status: AC
  Administered 2012-08-19: 150 mg via INTRAMUSCULAR

## 2012-08-19 MED ORDER — FERROUS SULFATE 325 (65 FE) MG PO TABS: 325.0000 mg | ORAL_TABLET | Freq: Two times a day (BID) | ORAL | Status: DC
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
## 2012-08-19 NOTE — Discharge Summary (Signed)
  Obstetric Discharge Summary Reason for Admission: onset of labor Prenatal Procedures: none Intrapartum Procedures: spontaneous vaginal delivery Postpartum Procedures: none Complications-Operative and Postpartum: none  Hemoglobin  Date Value Range Status  08/18/2012 9.4* 12.0 - 15.0 g/dL Final     HCT  Date Value Range Status  08/18/2012 28.1* 36.0 - 46.0 % Final    Physical Exam:  General: alert Lochia: appropriate Uterine: firm Incision: n/a DVT Evaluation: No evidence of DVT seen on physical exam.  Discharge Diagnoses: Active Problems:  Normal delivery  Anemia complicating pregnancy   Discharge Information: Date: 08/19/2012 Activity: pelvic rest Diet: routine Medications:  Prior to Admission medications   Medication Sig Start Date End Date Taking? Authorizing Provider  acetaminophen (TYLENOL) 500 MG tablet Take 500 mg by mouth every 6 (six) hours as needed.   Yes Historical Provider, MD  Prenatal Vit-Fe Fumarate-FA (PRENATAL MULTIVITAMIN) TABS Take 1 tablet by mouth daily.    Yes Historical Provider, MD  ferrous sulfate 325 (65 FE) MG tablet Take 1 tablet (325 mg total) by mouth 2 (two) times daily before a meal. 08/19/12 08/19/13  Antionette Char, MD    Condition: stable Instructions: refer to routine discharge instructions Discharge to: home Follow-up Information    Follow up with MARSHALL,BERNARD A, MD. Schedule an appointment as soon as possible for a visit in 6 weeks.   Contact information:   [address] [place] [address] [phone]          Newborn Data: Live born  Information for the patient's newborn:  Bradee, Common [[id number]]  female ; APGAR , ; weight ;  Home with mother.  JACKSON-MOORE,Meily Glowacki A 08/19/2012, 8:52 AM

## 2012-08-20 NOTE — Progress Notes (Signed)
Post discharge chart review completed.  

## 2012-08-22 ENCOUNTER — Encounter (HOSPITAL_COMMUNITY): Payer: Self-pay | Admitting: *Deleted

## 2013-01-22 ENCOUNTER — Emergency Department (HOSPITAL_COMMUNITY): Payer: Self-pay

## 2013-01-22 ENCOUNTER — Emergency Department (HOSPITAL_COMMUNITY)
Admission: EM | Admit: 2013-01-22 | Discharge: 2013-01-22 | Disposition: A | Discharge status: Home / Self Care | Payer: Self-pay | Attending: Emergency Medicine | Admitting: Emergency Medicine

## 2013-01-22 ENCOUNTER — Encounter (HOSPITAL_COMMUNITY): Payer: Self-pay | Admitting: Physical Medicine and Rehabilitation

## 2013-01-22 DIAGNOSIS — Z3202 Encounter for pregnancy test, result negative: Secondary | ICD-10-CM | POA: Insufficient documentation

## 2013-01-22 DIAGNOSIS — N83209 Unspecified ovarian cyst, unspecified side: Secondary | ICD-10-CM | POA: Insufficient documentation

## 2013-01-22 DIAGNOSIS — Z87448 Personal history of other diseases of urinary system: Secondary | ICD-10-CM | POA: Insufficient documentation

## 2013-01-22 DIAGNOSIS — R509 Fever, unspecified: Secondary | ICD-10-CM | POA: Insufficient documentation

## 2013-01-22 DIAGNOSIS — Z8742 Personal history of other diseases of the female genital tract: Secondary | ICD-10-CM | POA: Insufficient documentation

## 2013-01-22 DIAGNOSIS — J45909 Unspecified asthma, uncomplicated: Secondary | ICD-10-CM | POA: Insufficient documentation

## 2013-01-22 DIAGNOSIS — R350 Frequency of micturition: Secondary | ICD-10-CM | POA: Insufficient documentation

## 2013-01-22 DIAGNOSIS — Z8751 Personal history of pre-term labor: Secondary | ICD-10-CM | POA: Insufficient documentation

## 2013-01-22 DIAGNOSIS — R3915 Urgency of urination: Secondary | ICD-10-CM | POA: Insufficient documentation

## 2013-01-22 LAB — URINALYSIS, ROUTINE W REFLEX MICROSCOPIC
Glucose, UA: NEGATIVE mg/dL
Nitrite: NEGATIVE
Specific Gravity, Urine: 1.021 (ref 1.005–1.030)
pH: 6 (ref 5.0–8.0)

## 2013-01-22 LAB — CBC WITH DIFFERENTIAL/PLATELET
Basophils Relative: 0 % (ref 0–1)
Hemoglobin: 11.5 g/dL — ABNORMAL LOW (ref 12.0–15.0)
Lymphocytes Relative: 35 % (ref 12–46)
Lymphs Abs: 2.3 10*3/uL (ref 0.7–4.0)
Monocytes Relative: 6 % (ref 3–12)
Neutro Abs: 3.7 10*3/uL (ref 1.7–7.7)
Neutrophils Relative %: 55 % (ref 43–77)
RBC: 3.99 MIL/uL (ref 3.87–5.11)
WBC: 6.7 10*3/uL (ref 4.0–10.5)

## 2013-01-22 LAB — BASIC METABOLIC PANEL
BUN: 10 mg/dL (ref 6–23)
CO2: 27 mEq/L (ref 19–32)
Chloride: 101 mEq/L (ref 96–112)
GFR calc Af Amer: >90 mL/min (ref >90)
Glucose, Bld: 101 mg/dL — ABNORMAL HIGH (ref 70–99)
Potassium: 3.6 mEq/L (ref 3.5–5.1)

## 2013-01-22 LAB — URINE MICROSCOPIC-ADD ON

## 2013-01-22 MED ORDER — ONDANSETRON HCL 4 MG PO TABS: 4.0000 mg | ORAL_TABLET | Freq: Four times a day (QID) | ORAL | Status: DC

## 2013-01-22 MED ORDER — HYDROCODONE-ACETAMINOPHEN 5-325 MG PO TABS: 1.0000 | ORAL_TABLET | ORAL | Status: DC | PRN

## 2013-01-22 MED ORDER — HYDROMORPHONE HCL PF 1 MG/ML IJ SOLN
1.0000 mg | Freq: Once | INTRAMUSCULAR | Status: AC
  Administered 2013-01-22: 1 mg via INTRAVENOUS
  Filled 2013-01-22: qty 1

## 2013-01-22 NOTE — ED Notes (Signed)
Pt presents to department for evaluation of L sided flank and lower abdominal pain. Ongoing since Monday. 7/10 pain. Denies urinary symptoms. Denies vaginal symptoms. LMP: 10/21/12. Pt is alert and oriented x4.

## 2013-01-22 NOTE — ED Provider Notes (Signed)
History     CSN: [id number]  Arrival date & time 01/22/13  1535   First MD Initiated Contact with Patient 01/22/13 1823      Chief Complaint  Patient presents with  . Abdominal Pain  . Flank Pain    (Consider location/radiation/quality/duration/timing/severity/associated sxs/prior treatment) HPI Comments: 32 y/o female presents to the ED complaining of intermittent abdominal pain radiating to the flank and groin x2 days.  Patient describes pain as sharp rated 7-8/10.  Pain aggravated by walking and pressure to the area, mildly relieved by tylenol.  Admits to associated increased frequency, urgency and hematuria. No dysuria. Patient has a history of pyelonephritis in September of last year. Admits to subjective fever. No chills, nausea, vomiting, vaginal bleeding or discharge. LMP was October 21, 2012.  Patient has one sexual partner in the last 6 months, and reports using condoms as her method of birth control.  G2P2. She had a baby 5 months ago at [facility] and states she went into preterm labor from pregnancy induced HTN. Denies any history of abdominal surgeries.   Patient is a 32 y.o. female presenting with abdominal pain and flank pain. The history is provided by the patient. No language interpreter was used.  Abdominal Pain Associated symptoms: fever and hematuria   Associated symptoms: no chills, no constipation, no diarrhea, no dysuria, no nausea and no vomiting   Flank Pain Associated symptoms include abdominal pain and a fever. Pertinent negatives include no chills, nausea or vomiting.    Past Medical History  Diagnosis Date  . Asthma   . Preterm labor     1st pregnancy d/t pre-e  . Pregnancy induced hypertension     previous pregnancy  . Pyelonephritis     June 2013    Past Surgical History  Procedure Laterality Date  . No past surgeries      Family History  Problem Relation Age of Onset  . Anesthesia problems Neg Hx   . Hypotension Neg Hx   .  Malignant hyperthermia Neg Hx   . Pseudochol deficiency Neg Hx   . Other Neg Hx   . Hypertension Mother   . Asthma Father     History  Substance Use Topics  . Smoking status: Never Smoker   . Smokeless tobacco: Never Used  . Alcohol Use: No     Comment: a little when not pregnant    OB History   Grav Para Term Preterm Abortions TAB SAB Ect Mult Living   2 2 1 1  0 0 0 0 0 2      Review of Systems  Constitutional: Positive for fever. Negative for chills and appetite change.  Gastrointestinal: Positive for abdominal pain. Negative for nausea, vomiting, diarrhea and constipation.  Genitourinary: Positive for urgency, frequency, hematuria and flank pain. Negative for dysuria and difficulty urinating.  Musculoskeletal: Negative for back pain.  All other systems reviewed and are negative.    Allergies  Iohexol  Home Medications   Current Outpatient Rx  Name  Route  Sig  Dispense  Refill  . acetaminophen (TYLENOL) 500 MG tablet   Oral   Take 500 mg by mouth every 6 (six) hours as needed for pain.            BP 133/74  Pulse 72  Temp(Src) 98.3 F (36.8 C) (Oral)  Resp 16  SpO2 100%  Physical Exam  Nursing note and vitals reviewed. Constitutional: She is oriented to person, place, and time. She appears well-developed and  well-nourished. No distress.  HENT:  Head: Normocephalic and atraumatic.  Mouth/Throat: Oropharynx is clear and moist.  Eyes: Conjunctivae are normal. Pupils are equal, round, and reactive to light. Right eye exhibits no discharge. Left eye exhibits no discharge.  Neck: Normal range of motion. Neck supple.  Cardiovascular: Normal rate, regular rhythm and normal heart sounds.   Pulmonary/Chest: Effort normal and breath sounds normal. No respiratory distress. She has no wheezes. She has no rales. She exhibits no tenderness.  Abdominal: Soft. Normal appearance and bowel sounds are normal. She exhibits no distension and no mass. There is tenderness.  There is CVA tenderness (left). There is no rigidity, no rebound and no guarding.    Musculoskeletal: Normal range of motion.  Lymphadenopathy:    She has no cervical adenopathy.  Neurological: She is alert and oriented to person, place, and time.  Skin: Skin is warm and dry. She is not diaphoretic.  Psychiatric: She has a normal mood and affect. Her behavior is normal.    ED Course  Procedures (including critical care time)  Labs Reviewed  URINALYSIS, ROUTINE W REFLEX MICROSCOPIC - Abnormal; Notable for the following:    APPearance CLOUDY (*)    Hgb urine dipstick SMALL (*)    Leukocytes, UA SMALL (*)    All other components within normal limits  URINE MICROSCOPIC-ADD ON - Abnormal; Notable for the following:    Squamous Epithelial / LPF MANY (*)    Bacteria, UA FEW (*)    All other components within normal limits  CBC WITH DIFFERENTIAL - Abnormal; Notable for the following:    Hemoglobin 11.5 (*)    HCT 33.4 (*)    All other components within normal limits  BASIC METABOLIC PANEL - Abnormal; Notable for the following:    Glucose, Bld 101 (*)    Creatinine, Ser 0.44 (*)    All other components within normal limits  URINE CULTURE  POCT PREGNANCY, URINE   Ct Abdomen Pelvis Wo Contrast  01/22/2013  *RADIOLOGY REPORT*  Clinical Data:   Left flank pain.  Lower abdominal pain.  CT ABDOMEN AND PELVIS WITHOUT CONTRAST  Technique:  Multidetector CT imaging of the abdomen and pelvis was performed following the standard protocol without intravenous contrast.  Comparison: Multiple exams, including 01/28/2010  Findings: The blood pool is less dense than the myocardium, suggesting mild anemia.  The visualized portion of the liver, spleen, pancreas, and adrenal glands appear unremarkable in noncontrast CT appearance.  5 mm curvilinear density noted dependently in the gallbladder, suspicious for a small gallstone.  No pericholecystic fluid noted.  No renal calculi are observed.  No hydronephrosis  or hydroureter. No ureteral or bladder calculus noted.  The appendix appears normal.  Redundant sigmoid colon is otherwise unremarkable.  Fluid density left ovarian lesion may measure up to 2.9 x 2.3 cm. Uterine contour unremarkable.  No pathologic retroperitoneal or porta hepatis adenopathy is identified.  No pathologic pelvic adenopathy is identified.  IMPRESSION:  1.  Fluid density left ovarian lesion, probably a cyst, measuring up to 2.9 cm. If clinically warranted, this could be further characterized by dedicated pelvic sonography. 2.  Trace amount of left eccentric free pelvic fluid, probably physiologic. 3.  The kidneys appear unremarkable. 4.  I suspect a small gallstone.  No pericholecystic fluid. 4.  Slightly low density blood pool, suggesting mild anemia.   Original Report Authenticated By: Gaylyn Rong, M.D.      1. Ovarian cyst       MDM  32 year old female with left ovarian cyst. No stone or any other acute abnormality seen on CT scan. Urine clear. Labs otherwise unremarkable. She is feeling more comfortable after receiving pain medication. Mild tenderness to palpation of left lower quadrant still present, however greatly improved. OB/GYN is Dr. Gaynell Face who she will followup with regarding her cyst. Restrictions were Vicodin and Zofran given. Return precautions discussed. Patient states understanding of plan and is agreeable.        Trevor Mace, PA-C 01/22/13 2109

## 2013-01-23 LAB — URINE CULTURE: Culture: NO GROWTH

## 2013-01-23 NOTE — ED Provider Notes (Signed)
Medical screening examination/treatment/procedure(s) were performed by non-physician practitioner and as supervising physician I was immediately available for consultation/collaboration.   Richardean Canal, MD 01/23/13 2138

## 2013-02-12 ENCOUNTER — Emergency Department (HOSPITAL_COMMUNITY)
Admission: EM | Admit: 2013-02-12 | Discharge: 2013-02-12 | Disposition: A | Discharge status: Home / Self Care | Payer: Self-pay | Attending: Emergency Medicine | Admitting: Emergency Medicine

## 2013-02-12 ENCOUNTER — Encounter (HOSPITAL_COMMUNITY): Payer: Self-pay | Admitting: *Deleted

## 2013-02-12 DIAGNOSIS — Z87448 Personal history of other diseases of urinary system: Secondary | ICD-10-CM | POA: Insufficient documentation

## 2013-02-12 DIAGNOSIS — Z3202 Encounter for pregnancy test, result negative: Secondary | ICD-10-CM | POA: Insufficient documentation

## 2013-02-12 DIAGNOSIS — Z8751 Personal history of pre-term labor: Secondary | ICD-10-CM | POA: Insufficient documentation

## 2013-02-12 DIAGNOSIS — J40 Bronchitis, not specified as acute or chronic: Secondary | ICD-10-CM | POA: Insufficient documentation

## 2013-02-12 DIAGNOSIS — R112 Nausea with vomiting, unspecified: Secondary | ICD-10-CM | POA: Insufficient documentation

## 2013-02-12 DIAGNOSIS — R197 Diarrhea, unspecified: Secondary | ICD-10-CM | POA: Insufficient documentation

## 2013-02-12 DIAGNOSIS — Z79899 Other long term (current) drug therapy: Secondary | ICD-10-CM | POA: Insufficient documentation

## 2013-02-12 DIAGNOSIS — Z8742 Personal history of other diseases of the female genital tract: Secondary | ICD-10-CM | POA: Insufficient documentation

## 2013-02-12 DIAGNOSIS — J45909 Unspecified asthma, uncomplicated: Secondary | ICD-10-CM | POA: Insufficient documentation

## 2013-02-12 DIAGNOSIS — J3489 Other specified disorders of nose and nasal sinuses: Secondary | ICD-10-CM | POA: Insufficient documentation

## 2013-02-12 LAB — LIPASE, BLOOD: Lipase: 55 U/L (ref 11–59)

## 2013-02-12 LAB — URINALYSIS, ROUTINE W REFLEX MICROSCOPIC
Ketones, ur: NEGATIVE mg/dL
Leukocytes, UA: NEGATIVE
Nitrite: NEGATIVE
Protein, ur: NEGATIVE mg/dL

## 2013-02-12 LAB — CBC WITH DIFFERENTIAL/PLATELET
Basophils Relative: 0 % (ref 0–1)
HCT: 35.1 % — ABNORMAL LOW (ref 36.0–46.0)
Hemoglobin: 12.3 g/dL (ref 12.0–15.0)
MCHC: 35 g/dL (ref 30.0–36.0)
MCV: 83 fL (ref 78.0–100.0)
Monocytes Absolute: 0.8 10*3/uL (ref 0.1–1.0)
Monocytes Relative: 9 % (ref 3–12)
Neutro Abs: 5.6 10*3/uL (ref 1.7–7.7)

## 2013-02-12 LAB — COMPREHENSIVE METABOLIC PANEL
BUN: 13 mg/dL (ref 6–23)
CO2: 26 mEq/L (ref 19–32)
Chloride: 101 mEq/L (ref 96–112)
Creatinine, Ser: 0.5 mg/dL (ref 0.50–1.10)
GFR calc non Af Amer: >90 mL/min (ref >90)
Total Bilirubin: 0.2 mg/dL — ABNORMAL LOW (ref 0.3–1.2)

## 2013-02-12 MED ORDER — ONDANSETRON 4 MG PO TBDP
8.0000 mg | ORAL_TABLET | Freq: Once | ORAL | Status: AC
  Administered 2013-02-12: 8 mg via ORAL
  Filled 2013-02-12: qty 2

## 2013-02-12 MED ORDER — ALBUTEROL SULFATE HFA 108 (90 BASE) MCG/ACT IN AERS
2.0000 | INHALATION_SPRAY | RESPIRATORY_TRACT | Status: DC
  Administered 2013-02-12: 2 via RESPIRATORY_TRACT
  Filled 2013-02-12: qty 6.7

## 2013-02-12 MED ORDER — PROMETHAZINE HCL 25 MG PO TABS: 25.0000 mg | ORAL_TABLET | Freq: Four times a day (QID) | ORAL | Status: DC | PRN

## 2013-02-12 MED ORDER — AZITHROMYCIN 250 MG PO TABS: 250.0000 mg | ORAL_TABLET | Freq: Every day | ORAL | Status: DC

## 2013-02-12 NOTE — ED Notes (Signed)
Pt states started vomiting last nite with fever and chills.  Pt reports abdominal pain.  No vaginal discharge or bleeding.  LMP - now.

## 2013-02-12 NOTE — ED Provider Notes (Signed)
History     CSN: [id number]  Arrival date & time 02/12/13  1400   First MD Initiated Contact with Patient 02/12/13 1507      CC: Cough   The history is provided by the patient.   The patient presents with 2 days of coughing congestion associated with fevers and chills.  Her child has similar symptoms.  She's had some nasal congestion.  No shortness of breath.  She's had nausea and vomiting without diarrhea.  She reports some abdominal cramps.  No urinary complaints.  No vaginal complaints.  She does have a history of asthma and currently does not have her inhaler.  Her symptoms are mild in severity   Past Medical History  Diagnosis Date  . Asthma   . Preterm labor     1st pregnancy d/t pre-e  . Pregnancy induced hypertension     previous pregnancy  . Pyelonephritis     June 2013    Past Surgical History  Procedure Laterality Date  . No past surgeries      Family History  Problem Relation Age of Onset  . Anesthesia problems Neg Hx   . Hypotension Neg Hx   . Malignant hyperthermia Neg Hx   . Pseudochol deficiency Neg Hx   . Other Neg Hx   . Hypertension Mother   . Asthma Father     History  Substance Use Topics  . Smoking status: Never Smoker   . Smokeless tobacco: Never Used  . Alcohol Use: No     Comment: a little when not pregnant    OB History   Grav Para Term Preterm Abortions TAB SAB Ect Mult Living   2 2 1 1  0 0 0 0 0 2      Review of Systems  All other systems reviewed and are negative.    Allergies  Iohexol  Home Medications   Current Outpatient Rx  Name  Route  Sig  Dispense  Refill  . acetaminophen (TYLENOL) 500 MG tablet   Oral   Take 1,000 mg by mouth every 6 (six) hours as needed for pain.          . Pseudoeph-Doxylamine-DM-APAP (NYQUIL PO)   Oral   Take 15 mLs by mouth every 6 (six) hours as needed. For cold         . azithromycin (ZITHROMAX Z-PAK) 250 MG tablet   Oral   Take 1 tablet (250 mg total) by mouth daily. Take 2  tabs for first dose, then 1 tab for each additional dose   6 tablet   0   . EXPIRED: promethazine (PHENERGAN) 12.5 MG tablet   Oral   Take 1 tablet (12.5 mg total) by mouth every 6 (six) hours as needed for nausea.   30 tablet   0   . promethazine (PHENERGAN) 25 MG tablet   Oral   Take 1 tablet (25 mg total) by mouth every 6 (six) hours as needed for nausea.   12 tablet   0     BP 114/63  Pulse 69  Temp(Src) 97.5 F (36.4 C) (Oral)  Resp 16  SpO2 100%  LMP 02/09/2013  Physical Exam  Nursing note and vitals reviewed. Constitutional: She is oriented to person, place, and time. She appears well-developed and well-nourished. No distress.  HENT:  Head: Normocephalic and atraumatic.  Eyes: EOM are normal.  Neck: Normal range of motion.  Cardiovascular: Normal rate, regular rhythm and normal heart sounds.   Pulmonary/Chest: Effort normal  and breath sounds normal.  Abdominal: Soft. She exhibits no distension. There is no tenderness.  Musculoskeletal: Normal range of motion.  Neurological: She is alert and oriented to person, place, and time.  Skin: Skin is warm and dry.  Psychiatric: She has a normal mood and affect. Judgment normal.    ED Course  Procedures (including critical care time)  Labs Reviewed  CBC WITH DIFFERENTIAL - Abnormal; Notable for the following:    HCT 35.1 (*)    All other components within normal limits  COMPREHENSIVE METABOLIC PANEL - Abnormal; Notable for the following:    AST 43 (*)    ALT 40 (*)    Alkaline Phosphatase 206 (*)    Total Bilirubin 0.2 (*)    All other components within normal limits  URINALYSIS, ROUTINE W REFLEX MICROSCOPIC - Abnormal; Notable for the following:    Specific Gravity, Urine <1.005 (*)    Hgb urine dipstick LARGE (*)    All other components within normal limits  LIPASE, BLOOD  URINE MICROSCOPIC-ADD ON  POCT PREGNANCY, URINE   No results found.   1. Bronchitis       MDM  Likely viral URI vs bronchitis  vs early PNA. Home with albuterol and azithromycin. Home with phenergan. Abdominal exam is benign. Will return to ER for worsening symptoms. Vitals normal        Lyanne Co, MD 02/12/13 1556

## 2013-03-06 ENCOUNTER — Encounter (HOSPITAL_COMMUNITY): Payer: Self-pay | Admitting: *Deleted

## 2013-03-06 ENCOUNTER — Inpatient Hospital Stay (HOSPITAL_COMMUNITY): Payer: Self-pay

## 2013-03-06 ENCOUNTER — Inpatient Hospital Stay (HOSPITAL_COMMUNITY)
Admission: AD | Admit: 2013-03-06 | Discharge: 2013-03-06 | Disposition: A | Discharge status: Home / Self Care | Payer: Self-pay | Source: Ambulatory Visit | Attending: Obstetrics & Gynecology | Admitting: Obstetrics & Gynecology

## 2013-03-06 DIAGNOSIS — R1032 Left lower quadrant pain: Secondary | ICD-10-CM | POA: Insufficient documentation

## 2013-03-06 DIAGNOSIS — N83209 Unspecified ovarian cyst, unspecified side: Secondary | ICD-10-CM

## 2013-03-06 LAB — URINALYSIS, ROUTINE W REFLEX MICROSCOPIC
Bilirubin Urine: NEGATIVE
Ketones, ur: NEGATIVE mg/dL
Leukocytes, UA: NEGATIVE
Nitrite: NEGATIVE
Specific Gravity, Urine: <1.005 — ABNORMAL LOW (ref 1.005–1.030)
Urobilinogen, UA: 0.2 mg/dL (ref 0.0–1.0)

## 2013-03-06 LAB — WET PREP, GENITAL
Clue Cells Wet Prep HPF POC: NONE SEEN
Yeast Wet Prep HPF POC: NONE SEEN

## 2013-03-06 MED ORDER — KETOROLAC TROMETHAMINE 10 MG PO TABS: 10.0000 mg | ORAL_TABLET | Freq: Four times a day (QID) | ORAL | Status: DC | PRN

## 2013-03-06 MED ORDER — KETOROLAC TROMETHAMINE 60 MG/2ML IM SOLN
60.0000 mg | Freq: Once | INTRAMUSCULAR | Status: AC
  Administered 2013-03-06: 60 mg via INTRAMUSCULAR
  Filled 2013-03-06: qty 2

## 2013-03-06 NOTE — MAU Note (Signed)
Pt states has pain on her left lower groin area-started 1 week ago and is constant

## 2013-03-06 NOTE — MAU Provider Note (Signed)
History     CSN: [id number]  Arrival date and time: 03/06/13 7829   First Provider Initiated Contact with Patient 03/06/13 2218      Chief Complaint  Patient presents with  . Abdominal Pain   HPI  Cathy Nash is a 32 y.o. [id number] who presents today with LLQ pain. She states that about a month ago she was seen at [facility] and told she had an ovarian cyst. She was not bothered by it until about 2 days ago and then she started having 6-7/10 LLQ pain. She has taken some RX medicine left over from the ER visit. She states it did not help much with the pain.   Past Medical History  Diagnosis Date  . Asthma   . Preterm labor     1st pregnancy d/t pre-e  . Pregnancy induced hypertension     previous pregnancy  . Pyelonephritis     June 2013    Past Surgical History  Procedure Laterality Date  . No past surgeries      Family History  Problem Relation Age of Onset  . Anesthesia problems Neg Hx   . Hypotension Neg Hx   . Malignant hyperthermia Neg Hx   . Pseudochol deficiency Neg Hx   . Other Neg Hx   . Hypertension Mother   . Asthma Father     History  Substance Use Topics  . Smoking status: Never Smoker   . Smokeless tobacco: Never Used  . Alcohol Use: No     Comment: a little when not pregnant    Allergies:  Allergies  Allergen Reactions  . Iohexol      Code: SOB, Desc: IMMEDIATELY SOB FOLLOWING IV INJECTION, SPOTTY HIVES, PT GIVEN BENADRYL AND EPI-ARS 08/22/09, Onset Date: [id number]     Prescriptions prior to admission  Medication Sig Dispense Refill  . acetaminophen (TYLENOL) 500 MG tablet Take 1,000 mg by mouth every 6 (six) hours as needed for pain.       . promethazine (PHENERGAN) 25 MG tablet Take 1 tablet (25 mg total) by mouth every 6 (six) hours as needed for nausea.  12 tablet  0  . [DISCONTINUED] azithromycin (ZITHROMAX Z-PAK) 250 MG tablet Take 1 tablet (250 mg total) by mouth daily. Take 2 tabs for first dose, then 1 tab for each  additional dose  6 tablet  0    Review of Systems  Constitutional: Negative for fever and chills.  Gastrointestinal: Positive for abdominal pain, constipation (last BM today, with pain) and blood in stool (from a hemmorhod that she has had since pregnancy. ). Negative for nausea, vomiting, diarrhea and melena.  Genitourinary: Positive for frequency. Negative for dysuria and urgency.  Musculoskeletal: Negative for myalgias.  Neurological: Negative for dizziness.   Physical Exam   Blood pressure 135/65, pulse 73, temperature 98.2 F (36.8 C), temperature source Oral, resp. rate 20, height 5\' 2"  (1.575 m), weight 59.875 kg (132 lb), last menstrual period 01/16/2013, SpO2 100.00%, not currently breastfeeding.  Physical Exam  Nursing note and vitals reviewed. Constitutional: She is oriented to person, place, and time. She appears well-developed and well-nourished. No distress.  Cardiovascular: Normal rate.   Respiratory: Effort normal.  GI: Soft.  Neurological: She is alert and oriented to person, place, and time.  Skin: Skin is warm.  Psychiatric: She has a normal mood and affect.    MAU Course  Procedures  Results for orders placed during the hospital encounter of 03/06/13 (from the past 24 hour(s))  URINALYSIS, ROUTINE W REFLEX MICROSCOPIC     Status: Abnormal   Collection Time    03/06/13  7:25 PM      Result Value Range   Color, Urine YELLOW  YELLOW   APPearance CLEAR  CLEAR   Specific Gravity, Urine <1.005 (*) 1.005 - 1.030   pH 5.5  5.0 - 8.0   Glucose, UA NEGATIVE  NEGATIVE mg/dL   Hgb urine dipstick TRACE (*) NEGATIVE   Bilirubin Urine NEGATIVE  NEGATIVE   Ketones, ur NEGATIVE  NEGATIVE mg/dL   Protein, ur NEGATIVE  NEGATIVE mg/dL   Urobilinogen, UA 0.2  0.0 - 1.0 mg/dL   Nitrite NEGATIVE  NEGATIVE   Leukocytes, UA NEGATIVE  NEGATIVE  URINE MICROSCOPIC-ADD ON     Status: None   Collection Time    03/06/13  7:25 PM      Result Value Range   Squamous Epithelial /  LPF RARE  RARE   RBC / HPF 0-2  <3 RBC/hpf   Bacteria, UA RARE  RARE  POCT PREGNANCY, URINE     Status: None   Collection Time    03/06/13  7:32 PM      Result Value Range   Preg Test, Ur NEGATIVE  NEGATIVE     *RADIOLOGY REPORT*  Clinical Data: Pelvic pain. History of ovarian cysts.  TRANSABDOMINAL AND TRANSVAGINAL ULTRASOUND OF PELVIS  Technique: Both transabdominal and transvaginal ultrasound  examinations of the pelvis were performed. Transabdominal technique  was performed for global imaging of the pelvis including uterus,  ovaries, adnexal regions, and pelvic cul-de-sac.  It was necessary to proceed with endovaginal exam following the  transabdominal exam to visualize the uterus and adnexa.  Comparison: CT 01/22/2013.  Findings:  Uterus: Normal myometrial echotexture. Uterus measures 7 cm x 3.9  cm x 4.8 cm.  Endometrium: Normal for age at 12 mm.  Right ovary: Normal physiologic appearance measuring 31 mm x 21 mm  x 22 mm.  Left ovary: Benign appearing cyst is present in the left ovary  without septations or mural nodularity. This is predominately  anechoic with through transmission. The left ovary overall  measures 38 mm x 25 mm x 29 mm. The cyst measures 27 mm.  Other findings: No free fluid  IMPRESSION:  Physiologic appearance of the uterus and adnexa. No acute  abnormality.  Original Report Authenticated By: Andreas Newport, M.D.  Assessment and Plan   1. Other and unspecified ovarian cyst    RX toradol q 6 hours PRN FU with GYN in 1-2 months  Tawnya Crook 03/06/2013, 10:18 PM

## 2013-03-07 LAB — GC/CHLAMYDIA PROBE AMP: GC Probe RNA: NEGATIVE

## 2013-03-21 ENCOUNTER — Emergency Department (HOSPITAL_COMMUNITY)
Admission: EM | Admit: 2013-03-21 | Discharge: 2013-03-22 | Disposition: A | Discharge status: Home / Self Care | Payer: Self-pay | Attending: Emergency Medicine | Admitting: Emergency Medicine

## 2013-03-21 ENCOUNTER — Emergency Department (HOSPITAL_COMMUNITY): Payer: Self-pay

## 2013-03-21 ENCOUNTER — Encounter (HOSPITAL_COMMUNITY): Payer: Self-pay | Admitting: Emergency Medicine

## 2013-03-21 DIAGNOSIS — J029 Acute pharyngitis, unspecified: Secondary | ICD-10-CM | POA: Insufficient documentation

## 2013-03-21 DIAGNOSIS — Z87448 Personal history of other diseases of urinary system: Secondary | ICD-10-CM | POA: Insufficient documentation

## 2013-03-21 DIAGNOSIS — B349 Viral infection, unspecified: Secondary | ICD-10-CM

## 2013-03-21 DIAGNOSIS — Z8751 Personal history of pre-term labor: Secondary | ICD-10-CM | POA: Insufficient documentation

## 2013-03-21 DIAGNOSIS — Z3202 Encounter for pregnancy test, result negative: Secondary | ICD-10-CM | POA: Insufficient documentation

## 2013-03-21 DIAGNOSIS — R109 Unspecified abdominal pain: Secondary | ICD-10-CM | POA: Insufficient documentation

## 2013-03-21 DIAGNOSIS — IMO0001 Reserved for inherently not codable concepts without codable children: Secondary | ICD-10-CM | POA: Insufficient documentation

## 2013-03-21 DIAGNOSIS — R509 Fever, unspecified: Secondary | ICD-10-CM | POA: Insufficient documentation

## 2013-03-21 DIAGNOSIS — R112 Nausea with vomiting, unspecified: Secondary | ICD-10-CM | POA: Insufficient documentation

## 2013-03-21 DIAGNOSIS — B9789 Other viral agents as the cause of diseases classified elsewhere: Secondary | ICD-10-CM | POA: Insufficient documentation

## 2013-03-21 DIAGNOSIS — J45909 Unspecified asthma, uncomplicated: Secondary | ICD-10-CM | POA: Insufficient documentation

## 2013-03-21 LAB — COMPREHENSIVE METABOLIC PANEL
Albumin: 3.9 g/dL (ref 3.5–5.2)
Alkaline Phosphatase: 275 U/L — ABNORMAL HIGH (ref 39–117)
BUN: 6 mg/dL (ref 6–23)
Potassium: 3.3 mEq/L — ABNORMAL LOW (ref 3.5–5.1)
Sodium: 135 mEq/L (ref 135–145)
Total Protein: 7.9 g/dL (ref 6.0–8.3)

## 2013-03-21 LAB — POCT PREGNANCY, URINE: Preg Test, Ur: NEGATIVE

## 2013-03-21 LAB — URINALYSIS, ROUTINE W REFLEX MICROSCOPIC
Bilirubin Urine: NEGATIVE
Ketones, ur: 15 mg/dL — AB
Nitrite: NEGATIVE
Protein, ur: NEGATIVE mg/dL
Urobilinogen, UA: 2 mg/dL — ABNORMAL HIGH (ref 0.0–1.0)

## 2013-03-21 LAB — CBC WITH DIFFERENTIAL/PLATELET
Basophils Absolute: 0 10*3/uL (ref 0.0–0.1)
Basophils Relative: 0 % (ref 0–1)
Eosinophils Absolute: 0.3 10*3/uL (ref 0.0–0.7)
MCH: 28.2 pg (ref 26.0–34.0)
MCHC: 35.2 g/dL (ref 30.0–36.0)
Monocytes Relative: 5 % (ref 3–12)
Neutrophils Relative %: 84 % — ABNORMAL HIGH (ref 43–77)
Platelets: 311 10*3/uL (ref 150–400)
RDW: 14.8 % (ref 11.5–15.5)

## 2013-03-21 LAB — URINE MICROSCOPIC-ADD ON

## 2013-03-21 MED ORDER — KETOROLAC TROMETHAMINE 30 MG/ML IJ SOLN
30.0000 mg | Freq: Once | INTRAMUSCULAR | Status: AC
  Administered 2013-03-21: 30 mg via INTRAVENOUS
  Filled 2013-03-21: qty 1

## 2013-03-21 MED ORDER — SODIUM CHLORIDE 0.9 % IV BOLUS (SEPSIS)
1000.0000 mL | Freq: Once | INTRAVENOUS | Status: AC
  Administered 2013-03-21: 1000 mL via INTRAVENOUS

## 2013-03-21 MED ORDER — ACETAMINOPHEN 325 MG PO TABS
650.0000 mg | ORAL_TABLET | Freq: Once | ORAL | Status: AC
  Administered 2013-03-21: 650 mg via ORAL
  Filled 2013-03-21: qty 2

## 2013-03-21 MED ORDER — POTASSIUM CHLORIDE CRYS ER 20 MEQ PO TBCR
20.0000 meq | EXTENDED_RELEASE_TABLET | Freq: Once | ORAL | Status: AC
  Administered 2013-03-21: 20 meq via ORAL
  Filled 2013-03-21: qty 1

## 2013-03-21 NOTE — ED Notes (Signed)
Patient states she has been sick for several days and has not taken anything but tylenol for the aches and fever.

## 2013-03-21 NOTE — ED Provider Notes (Addendum)
History     CSN: [id number]  Arrival date & time 03/21/13  2017   First MD Initiated Contact with Patient 03/21/13 2315      Chief Complaint  Patient presents with  . Generalized Body Aches  . Emesis    (Consider location/radiation/quality/duration/timing/severity/associated sxs/prior treatment) The history is provided by the patient.  Cathy Nash is a 32 y.o. female history of asthma, UTI here presenting with diffuse bodyaches and fever. Diffuse bodyaches for the last 5 days. She also says some sore throat as well as nausea and abdominal cramps. Denies any vomiting or urinary symptoms. She had some nonproductive cough as well. Her baby also has similar symptoms and she was here a month ago for similar complaints and was sent home with diagnosis of viral syndrome.    Past Medical History  Diagnosis Date  . Asthma   . Preterm labor     1st pregnancy d/t pre-e  . Pregnancy induced hypertension     previous pregnancy  . Pyelonephritis     June 2013    Past Surgical History  Procedure Laterality Date  . No past surgeries      Family History  Problem Relation Age of Onset  . Anesthesia problems Neg Hx   . Hypotension Neg Hx   . Malignant hyperthermia Neg Hx   . Pseudochol deficiency Neg Hx   . Other Neg Hx   . Hypertension Mother   . Asthma Father     History  Substance Use Topics  . Smoking status: Never Smoker   . Smokeless tobacco: Never Used  . Alcohol Use: No     Comment: a little when not pregnant    OB History   Grav Para Term Preterm Abortions TAB SAB Ect Mult Living   2 2 1 1  0 0 0 0 0 2      Review of Systems  Constitutional: Positive for fever.  Gastrointestinal: Positive for abdominal pain.  Musculoskeletal: Positive for myalgias.  All other systems reviewed and are negative.    Allergies  Iohexol  Home Medications   Current Outpatient Rx  Name  Route  Sig  Dispense  Refill  . acetaminophen (TYLENOL) 500 MG tablet   Oral    Take 1,000 mg by mouth every 6 (six) hours as needed for pain.            BP 112/72  Pulse 109  Temp(Src) 99.4 F (37.4 C) (Oral)  Resp 19  SpO2 100%  LMP 03/08/2013  Physical Exam  Nursing note and vitals reviewed. Constitutional: She is oriented to person, place, and time. She appears well-developed.  Slightly uncomfortable, well appearing   HENT:  Head: Normocephalic.  OP slightly red, tonsils not enlarged   Eyes: Conjunctivae are normal. Pupils are equal, round, and reactive to light.  Neck: Normal range of motion. Neck supple.  No cervical LAD   Cardiovascular: Normal rate, regular rhythm and normal heart sounds.   Pulmonary/Chest: Effort normal and breath sounds normal. No respiratory distress. She has no wheezes. She has no rales.  Abdominal: Soft. Bowel sounds are normal.  Mild diffuse tenderness, no rebound. No CVAT   Musculoskeletal: Normal range of motion. She exhibits no edema and no tenderness.  Neurological: She is alert and oriented to person, place, and time.  Skin: Skin is warm and dry.  Psychiatric: She has a normal mood and affect. Her behavior is normal. Judgment and thought content normal.    ED Course  Procedures (including  critical care time)  Labs Reviewed  URINALYSIS, ROUTINE W REFLEX MICROSCOPIC - Abnormal; Notable for the following:    Hgb urine dipstick SMALL (*)    Ketones, ur 15 (*)    Urobilinogen, UA 2.0 (*)    All other components within normal limits  CBC WITH DIFFERENTIAL - Abnormal; Notable for the following:    WBC 11.0 (*)    Neutrophils Relative 84 (*)    Neutro Abs 9.3 (*)    Lymphocytes Relative 9 (*)    All other components within normal limits  COMPREHENSIVE METABOLIC PANEL - Abnormal; Notable for the following:    Potassium 3.3 (*)    Glucose, Bld 123 (*)    AST 118 (*)    ALT 67 (*)    Alkaline Phosphatase 275 (*)    All other components within normal limits  RAPID STREP SCREEN  URINE MICROSCOPIC-ADD ON   MONONUCLEOSIS SCREEN  POCT PREGNANCY, URINE   Dg Chest 2 View  03/22/2013  *RADIOLOGY REPORT*  Clinical Data: Generalized body aches.  Dry cough.  CHEST - 2 VIEW  Comparison: None.  Findings: Heart size is normal.  Mild interstitial coarsening is likely chronic.  A nodular density projected over the left lower lobe likely represents a nipple shadow.  The visualized soft tissues and bony thorax are unremarkable.  IMPRESSION:  1.  No acute cardiopulmonary disease. 2.  Mild interstitial coarsening is likely chronic. 3.  Nodular density projecting over the left lower lobe likely represents a nipple shadow.   Original Report Authenticated By: Marin Roberts, M.D.      No diagnosis found.    MDM  Cathy Nash is a 32 y.o. female here with viral syndrome. Elevated LFTs point to mono possibly. No enlarged liver though. Will get rapid strep and CXR to r/o other sources of infection. Will hydrate and reassess.   1:55 AM Felt better after fluids and toradol. Mono neg. Abdomen now soft and nontender. Strep neg. Likely viral syndrome. Tachycardic initially, improved with IVF. Will d/c home on tylenol, motrin. Return precautions given.         Richardean Canal, MD 03/22/13 0155  Richardean Canal, MD 03/22/13 [phone]

## 2013-03-21 NOTE — ED Notes (Signed)
PT. REPORTS BODY ACHES WITH FEVER ,NAUSEA , CHILLS, SORE THROAT  ,MID ABDOMINAL CRAMPING AND MID BACK PAIN ONSET LAST YESTERDAY.

## 2013-03-22 LAB — MONONUCLEOSIS SCREEN: Mono Screen: NEGATIVE

## 2013-03-22 MED ORDER — DEXTROMETHORPHAN-BENZOCAINE 5-7.5 MG MT LOZG: 1.0000 | LOZENGE | Freq: Three times a day (TID) | OROMUCOSAL | Status: DC

## 2013-06-05 ENCOUNTER — Encounter (HOSPITAL_COMMUNITY): Payer: Self-pay | Admitting: *Deleted

## 2013-06-05 ENCOUNTER — Inpatient Hospital Stay (HOSPITAL_COMMUNITY)
Admission: AD | Admit: 2013-06-05 | Discharge: 2013-06-05 | Disposition: A | Discharge status: Home / Self Care | Payer: Self-pay | Source: Ambulatory Visit | Attending: Obstetrics & Gynecology | Admitting: Obstetrics & Gynecology

## 2013-06-05 DIAGNOSIS — N938 Other specified abnormal uterine and vaginal bleeding: Secondary | ICD-10-CM | POA: Insufficient documentation

## 2013-06-05 DIAGNOSIS — R1032 Left lower quadrant pain: Secondary | ICD-10-CM | POA: Insufficient documentation

## 2013-06-05 DIAGNOSIS — N949 Unspecified condition associated with female genital organs and menstrual cycle: Secondary | ICD-10-CM | POA: Insufficient documentation

## 2013-06-05 DIAGNOSIS — N946 Dysmenorrhea, unspecified: Secondary | ICD-10-CM | POA: Insufficient documentation

## 2013-06-05 LAB — URINALYSIS, ROUTINE W REFLEX MICROSCOPIC
Bilirubin Urine: NEGATIVE
Ketones, ur: NEGATIVE mg/dL
Nitrite: NEGATIVE
pH: 6 (ref 5.0–8.0)

## 2013-06-05 LAB — CBC
HCT: 34.8 % — ABNORMAL LOW (ref 36.0–46.0)
Hemoglobin: 11.7 g/dL — ABNORMAL LOW (ref 12.0–15.0)
MCH: 28 pg (ref 26.0–34.0)
MCV: 83.3 fL (ref 78.0–100.0)
Platelets: 322 10*3/uL (ref 150–400)
RBC: 4.18 MIL/uL (ref 3.87–5.11)

## 2013-06-05 MED ORDER — KETOROLAC TROMETHAMINE 60 MG/2ML IM SOLN
60.0000 mg | Freq: Once | INTRAMUSCULAR | Status: AC
  Administered 2013-06-05: 60 mg via INTRAMUSCULAR
  Filled 2013-06-05: qty 2

## 2013-06-05 NOTE — MAU Provider Note (Signed)
History     CSN: [id number]  Arrival date and time: 06/05/13 4098   First Provider Initiated Contact with Patient 06/05/13 1303      Chief Complaint  Patient presents with  . Possible Pregnancy  . Vaginal Bleeding   HPI  Cathy Nash is 32 y.o. [id number] presents with vaginal bleeding, heavy with clots.  States she has left lower quadrant.  Rates pain 7/10.  Nothing makes it worse or better.  Has not tried anything for pain.   Had 3 + pregnancy tests at home.  LMP 04/30/13, shorter than usual.  Last took OCPs 1 month ago--"just forgot to take them".  Last bowel movement was last night, normal.  Only sexual partner is husband.   GC/CHL cultures were negative 3/26.     Past Medical History  Diagnosis Date  . Asthma   . Preterm labor     1st pregnancy d/t pre-e  . Pregnancy induced hypertension     previous pregnancy  . Pyelonephritis     June 2013    Past Surgical History  Procedure Laterality Date  . No past surgeries      Family History  Problem Relation Age of Onset  . Anesthesia problems Neg Hx   . Hypotension Neg Hx   . Malignant hyperthermia Neg Hx   . Pseudochol deficiency Neg Hx   . Other Neg Hx   . Hypertension Mother   . Asthma Father     History  Substance Use Topics  . Smoking status: Never Smoker   . Smokeless tobacco: Never Used  . Alcohol Use: No     Comment: a little when not pregnant    Allergies:  Allergies  Allergen Reactions  . Iohexol      Code: SOB, Desc: IMMEDIATELY SOB FOLLOWING IV INJECTION, SPOTTY HIVES, PT GIVEN BENADRYL AND EPI-ARS 08/22/09, Onset Date: 29562130     Prescriptions prior to admission  Medication Sig Dispense Refill  . Prenatal Vit-Fe Fumarate-FA (PRENATAL MULTIVITAMIN) TABS Take 1 tablet by mouth daily at 12 noon.        Review of Systems  Constitutional: Negative for fever and chills.  Gastrointestinal: Positive for abdominal pain (left lower abdominal pain). Negative for nausea and vomiting.   Genitourinary: Negative for dysuria, urgency and frequency.       + for vaginal bleeding  Neurological: Negative for headaches.   Physical Exam   Blood pressure 101/60, pulse 54, temperature 98.3 F (36.8 C), temperature source Oral, resp. rate 16, height 5' 2.5" (1.588 m), weight 127 lb 3.2 oz (57.698 kg), last menstrual period 04/30/2013, SpO2 100.00%.  Physical Exam  Constitutional: She is oriented to person, place, and time. She appears well-developed and well-nourished. No distress.  HENT:  Head: Normocephalic.  Neck: Normal range of motion.  Cardiovascular: Normal rate.   Respiratory: Effort normal.  GI: Soft. She exhibits no distension and no mass. There is no tenderness. There is no rebound and no guarding.  Genitourinary: There is bleeding (small amount of bright red blood, no clots seen) around the vagina. No erythema or tenderness around the vagina. No foreign body around the vagina. No vaginal discharge found.  Neurological: She is alert and oriented to person, place, and time.  Skin: Skin is warm and dry.  Psychiatric: She has a normal mood and affect. Her behavior is normal.   Results for orders placed during the hospital encounter of 06/05/13 (from the past 24 hour(s))  URINALYSIS, ROUTINE W REFLEX MICROSCOPIC  Status: Abnormal   Collection Time    06/05/13 10:05 AM      Result Value Range   Color, Urine STRAW (*) YELLOW   APPearance CLEAR  CLEAR   Specific Gravity, Urine <1.005 (*) 1.005 - 1.030   pH 6.0  5.0 - 8.0   Glucose, UA NEGATIVE  NEGATIVE mg/dL   Hgb urine dipstick LARGE (*) NEGATIVE   Bilirubin Urine NEGATIVE  NEGATIVE   Ketones, ur NEGATIVE  NEGATIVE mg/dL   Protein, ur NEGATIVE  NEGATIVE mg/dL   Urobilinogen, UA 0.2  0.0 - 1.0 mg/dL   Nitrite NEGATIVE  NEGATIVE   Leukocytes, UA NEGATIVE  NEGATIVE  URINE MICROSCOPIC-ADD ON     Status: Abnormal   Collection Time    06/05/13 10:05 AM      Result Value Range   Squamous Epithelial / LPF FEW (*)  RARE   RBC / HPF 0-2  <3 RBC/hpf   Bacteria, UA RARE  RARE  HCG, QUANTITATIVE, PREGNANCY     Status: None   Collection Time    06/05/13 10:35 AM      Result Value Range   hCG, Beta Chain, Quant, S 3  <5 mIU/mL  CBC     Status: Abnormal   Collection Time    06/05/13 10:35 AM      Result Value Range   WBC 6.2  4.0 - 10.5 K/uL   RBC 4.18  3.87 - 5.11 MIL/uL   Hemoglobin 11.7 (*) 12.0 - 15.0 g/dL   HCT 19.1 (*) 47.8 - 29.5 %   MCV 83.3  78.0 - 100.0 fL   MCH 28.0  26.0 - 34.0 pg   MCHC 33.6  30.0 - 36.0 g/dL   RDW 62.1 (*) 30.8 - 65.7 %   Platelets 322  150 - 400 K/uL   MAU Course  Procedures  Toradol 60mg  IM ordered  MDM Patient feels less discomfort after the Toradol.  Reviewed with her not to take addition NSaids until tomorrow but can use tylenol if needed  Assessment and Plan  A:  Dysmenorrhea     Negative UPT and <3 BHCG  P:  Tylenol prn for pain today and can add ibuprofen tomorrow as needed      May return to OCPs if this is her choice.        Deionna Marcantonio,EVE M 06/05/2013, 1:05 PM

## 2013-06-05 NOTE — Discharge Instructions (Signed)
Dysmenorrhea  (Dysmenorrhea)  La causa del dolor menstrual son las contracciones de los músculos del útero durante el período menstrual. Los músculos del útero se contraen debido a las sustancias químicas que se encuentran en sus paredes.  La dismenorrea primaria son cólicos menstruales que duran algunos días, al comienzo del período menstrual o poco después. Ocurren cuando la adolescente comienza a tener sus perìodos. A medida que la mujer madura, o tiene un bebé, los cólicos disminuyen o desaparecen.  La dismenorrea secundaria comienza en etapas posteriores de la vida, dura más y el dolor puede ser más intenso que en el caso de la dismenorrea primaria. El dolor puede comenzar antes del período y durar hasta algunos días después de su finalización. Este tipo de dismenorrea generalmente se debe a un problema subyacente como:  · El tejido que recubre internamente el útero se desarrolla fuera del mismo, en otras áreas del organismo (endometriosis).  · El tejido del endometrio, que generalmente recubre el útero, se desarrolla en las paredes musculares del útero (adenomiosis).  · Los vasos sanguíneos de la pelvis se llenan de sangre justo antes del período menstrual (síndrome congestivo pélvico).  · Desarrollo excesivo de algunas células del tejido que cubre el útero o el cuello del útero (pólipos del útero o del cuello).  · Caída del útero (prolapso) debido al aflojamiento y distensión de los ligamentos.  · Depresión.  · Problemas en la vejiga como infección o inflamación.  · Problemas intestinales, como tumores o síndrome de colon irritable.  · Cáncer en los órganos femeninos o en la vejiga.  · Útero excesivamente inclinado.  · Apertura del cuello del útero muy estrecha o cerrada.  · Tumores no cancerosos del útero (fibromas).  · Enfermedad pélvica inflamatoria (EPI).  · Cicatrices en la pelvis (adherencias) por cirugías previas.  · Quiste ovárico.  · Uso del dispositivo intrauterino (DIU) para el control de la  natalidad.  CAUSAS  La causa del dolor menstrual con frecuencia es desconocida.  SÍNTOMAS  · Cólicos o dolor punzante en la zona inferior del abdomen.  · En algunos casos, también se experimentan cefaleas.  · Dolor en la zona inferior de la espalda.  · Ganas de vomitar (náuseas ) o vómitos.  · Diarrea.  · Sudoración o mareos.  DIAGNÓSTICO  El diagnóstico se basa en la historia personal, los síntomas, el examen físico, pruebas diagnósticas o procedimientos. Las pruebas diagnósticas o procedimientos pueden incluir:  · Análisis de sangre.  · Ecografía.  · Un estudio de las capas que cubren el útero (dilatación y curetaje, D y C).  · Examen de la zona interna del abdomen o la pelvis con un laparoscopio.  · Radiografías.  · Tomografía computada.  · Resonancia magnética.  · Un examen del interior de la vejiga con un citoscopio.  · Examen del interior del intestino o el estómago con un colonoscopio o un gastroscopio.  TRATAMIENTO  El tratamiento depende de la causa de la dismenorrea. El tratamiento puede incluir:  · Analgésicos prescriptos por el médico.  · Píldoras anticonceptivas.  · Terapia de hormonas.  · Medicamentos antiinflamatorios no esteroides. Ayudan a detener la producción de prostaglandinas.  · Un dispositivo intrauterino que contenga progesterona.  · Acupuntura.  · Cirugía para extirpar adherencias, endometriosis, quistes ováricos o fibromas.  · Remoción del útero (histerectomía).  · Inyecciones de progesterona para interrumpir el periodo menstrual.  · Disección de los nervios del sacro que van a los órganos femeninos (neurectomía presacra).  · Corriente eléctrica a   los nervios sacros (estimulación del nervio sacro).  · Antidepresivos.  · Terapia psiquiátrica, psicoterapia o terapia grupal.  · La actividad física y la fisioterapia.  · La meditación y el yoga.  INSTRUCCIONES PARA EL CUIDADO DOMICILIARIO  · Utilice los medicamentos de venta libre o de prescripción para el dolor, el malestar o la fiebre, según  se lo indique el profesional que lo asiste.  · Coloque una almohadilla eléctrica o una botella con agua caliente en la zona inferior del abdomen. No duerma con la almohadilla térmica.  · Los ejercicios aeróbicos, caminatas, natación, bicicleta ayudan a disminuir los cólicos.  · Masajear la zona inferior de la espalda o el abdomen puede ser de ayuda.  · Dejar de fumar.  · Evite el alcohol y la cafeína.  · El yoga, la meditación o la acupuntura pueden ser de gran ayuda.  SOLICITE ATENCIÓN MÉDICA SI:  · El dolor no mejora con los medicamentos.  · Tiene dolor durante las relaciones sexuales.  SOLICITE ATENCIÓN MÉDICA DE INMEDIATO SI:  · El dolor aumenta y no consigue aliviarlo con los medicamentos.  · Tiene fiebre.  · Comienza a sentir náuseas o vomita durante el período y no puede controlarlo con medicamentos.  · Presenta una hemorragia vaginal anormal durante el período.  · Se desmaya.  ESTÉ SEGURO QUE:  · Comprende las instrucciones para el alta médica.  · Controlará su enfermedad.  · Solicitará atención médica de inmediato según las indicaciones.  Document Released: 09/07/2005 Document Revised: 02/20/2012  [employer]® Patient Information ©2014 [employer].

## 2013-06-05 NOTE — MAU Note (Signed)
Patient states she has had a positive pregnancy test at home about one week ago. Started spotting last night and like a period this morning, dark red. Vaginal pain.

## 2013-06-05 NOTE — MAU Note (Signed)
Explained to the patient the wait time. UPT in MAU ws negative and we are doing  HCG. Patient understands.

## 2013-07-12 ENCOUNTER — Emergency Department (HOSPITAL_COMMUNITY): Payer: Self-pay

## 2013-07-12 ENCOUNTER — Emergency Department (HOSPITAL_COMMUNITY)
Admission: EM | Admit: 2013-07-12 | Discharge: 2013-07-13 | Disposition: A | Discharge status: Home / Self Care | Payer: Self-pay | Attending: Emergency Medicine | Admitting: Emergency Medicine

## 2013-07-12 ENCOUNTER — Encounter (HOSPITAL_COMMUNITY): Payer: Self-pay | Admitting: *Deleted

## 2013-07-12 DIAGNOSIS — J45909 Unspecified asthma, uncomplicated: Secondary | ICD-10-CM | POA: Insufficient documentation

## 2013-07-12 DIAGNOSIS — O200 Threatened abortion: Secondary | ICD-10-CM | POA: Insufficient documentation

## 2013-07-12 LAB — COMPREHENSIVE METABOLIC PANEL
AST: 24 U/L (ref 0–37)
CO2: 23 mEq/L (ref 19–32)
Chloride: 101 mEq/L (ref 96–112)
Creatinine, Ser: 0.53 mg/dL (ref 0.50–1.10)
GFR calc non Af Amer: >90 mL/min (ref >90)
Total Bilirubin: 0.3 mg/dL (ref 0.3–1.2)

## 2013-07-12 LAB — CBC
HCT: 33.2 % — ABNORMAL LOW (ref 36.0–46.0)
MCV: 83.6 fL (ref 78.0–100.0)
Platelets: 298 10*3/uL (ref 150–400)
RBC: 3.97 MIL/uL (ref 3.87–5.11)
WBC: 6.4 10*3/uL (ref 4.0–10.5)

## 2013-07-12 LAB — URINALYSIS, ROUTINE W REFLEX MICROSCOPIC
Bilirubin Urine: NEGATIVE
Hgb urine dipstick: NEGATIVE
Specific Gravity, Urine: 1.008 (ref 1.005–1.030)
pH: 7 (ref 5.0–8.0)

## 2013-07-12 LAB — HCG, QUANTITATIVE, PREGNANCY: hCG, Beta Chain, Quant, S: 5934 m[IU]/mL — ABNORMAL HIGH (ref <5)

## 2013-07-12 MED ORDER — SODIUM CHLORIDE 0.9 % IV BOLUS (SEPSIS)
1000.0000 mL | Freq: Once | INTRAVENOUS | Status: AC
  Administered 2013-07-12: 1000 mL via INTRAVENOUS

## 2013-07-12 MED ORDER — ONDANSETRON HCL 4 MG/2ML IJ SOLN
4.0000 mg | Freq: Once | INTRAMUSCULAR | Status: AC
  Administered 2013-07-12: 4 mg via INTRAVENOUS
  Filled 2013-07-12: qty 2

## 2013-07-12 MED ORDER — MORPHINE SULFATE 4 MG/ML IJ SOLN
4.0000 mg | Freq: Once | INTRAMUSCULAR | Status: AC
  Administered 2013-07-12: 4 mg via INTRAVENOUS
  Filled 2013-07-12: qty 1

## 2013-07-12 NOTE — ED Provider Notes (Signed)
CSN: [id number]     Arrival date & time 07/12/13  2109 History     First MD Initiated Contact with Patient 07/12/13 2148     Chief Complaint  Patient presents with  . Abdominal Pain   (Consider location/radiation/quality/duration/timing/severity/associated sxs/prior Treatment) HPI Pt presents with c/o diffuse abdominal pain as well as nausea.  No vomiting or diarrhea.  No fever/chills.  Pain is constant.  She denies dysuria, no vaginal discharge or vaginal bleeding.  Her LMP was approx 6 weeks ago.  She states she has been drinking and eating normally.  She has not taken anything for her symptoms.  There are no other associated systemic symptoms, there are no other alleviating or modifying factors.   Past Medical History  Diagnosis Date  . Asthma    No past surgical history on file. History reviewed. No pertinent family history. History  Substance Use Topics  . Smoking status: Never Smoker   . Smokeless tobacco: Not on file  . Alcohol Use: No   OB History   Grav Para Term Preterm Abortions TAB SAB Ect Mult Living                 Review of Systems ROS reviewed and all otherwise negative except for mentioned in HPI  Allergies  Review of patient's allergies indicates no known allergies.  Home Medications   Current Outpatient Rx  Name  Route  Sig  Dispense  Refill  . acetaminophen (TYLENOL) 500 MG tablet   Oral   Take 500-1,000 mg by mouth every 6 (six) hours as needed for pain.         . Prenatal Vitamins (DIS) TABS   Oral   Take 1 tablet by mouth 1 day or 1 dose.   30 tablet   0    BP 97/45  Pulse 63  Temp(Src) 97.6 F (36.4 C) (Oral)  Resp 16  SpO2 100%  LMP 06/05/2013 Vitals reviewed Physical Exam Physical Examination: General appearance - alert, well appearing, and in no distress Mental status - alert, oriented to person, place, and time Eyes - no conjunctival injection, no scleral icterus Mouth - mucous membranes moist, pharynx normal without  lesions Chest - clear to auscultation, no wheezes, rales or rhonchi, symmetric air entry Heart - normal rate, regular rhythm, normal S1, S2, no murmurs, rubs, clicks or gallops Abdomen - soft, diffuse mild tenderness to palpation, no gaurding or rebound, nondistended, no masses or organomegaly Pelvic - normal external genitalia, vulva, vagina, cervix, uterus and adnexa, no vaginal bleeding, cervix closed Extremities - peripheral pulses normal, no pedal edema, no clubbing or cyanosis Skin - normal coloration and turgor, no rashes  ED Course   Procedures (including critical care time)  Labs Reviewed  WET PREP, GENITAL - Abnormal; Notable for the following:    Clue Cells Wet Prep HPF POC RARE (*)    WBC, Wet Prep HPF POC MODERATE (*)    All other components within normal limits  URINALYSIS, ROUTINE W REFLEX MICROSCOPIC - Abnormal; Notable for the following:    APPearance CLOUDY (*)    All other components within normal limits  PREGNANCY, URINE - Abnormal; Notable for the following:    Preg Test, Ur POSITIVE (*)    All other components within normal limits  CBC - Abnormal; Notable for the following:    Hemoglobin 11.4 (*)    HCT 33.2 (*)    RDW 15.8 (*)    All other components within normal limits  COMPREHENSIVE  METABOLIC PANEL - Abnormal; Notable for the following:    Potassium 3.4 (*)    Glucose, Bld 100 (*)    All other components within normal limits  HCG, QUANTITATIVE, PREGNANCY - Abnormal; Notable for the following:    hCG, Beta Chain, Quant, S 5934 (*)    All other components within normal limits  POCT PREGNANCY, URINE - Abnormal; Notable for the following:    Preg Test, Ur POSITIVE (*)    All other components within normal limits  GC/CHLAMYDIA PROBE AMP  LIPASE, BLOOD   US Ob Comp Less 14 Wks  07/12/2013   *RADIOLOGY REPORT*  Clinical Data: 32 year old pregnant female with pelvic pain. Estimated gestational age of [redacted] weeks 2 days by LMP.  OBSTETRIC <14 WK [place] AND  TRANSVAGINAL OB US  Technique:  Both transabdominal and transvaginal ultrasound examinations were performed for complete evaluation of the gestation as well as the maternal uterus, adnexal regions, and pelvic cul-de-sac.  Transvaginal technique was performed to assess early pregnancy.  Comparison:  None  Intrauterine gestational sac:  Visualized and normal in shape Yolk sac: Visualized Embryo: Not visualized  MSD: 8.5 mm  5 w 5 d          [place] EDC: 03/09/2014  Maternal uterus/adnexae: There is no evidence of subchorionic hemorrhage. Two separate adjacent simple appearing cyst within the right ovary are noted, the largest measuring 2 x 2.8 cm. The left ovary is unremarkable. A trace amount of free fluid is noted.  There is no evidence of solid adnexal mass.  IMPRESSION: Single intrauterine gestational sac with yolk sac. No embryo identified at this time. Estimated gestational age of [redacted] weeks 5 days by this ultrasound.  No evidence of subchorionic hemorrhage.  Simple appearing right ovarian cysts.   Original Report Authenticated By: Harmon Pier, M.D.   US Ob Transvaginal  07/12/2013   *RADIOLOGY REPORT*  Clinical Data: 32 year old pregnant female with pelvic pain. Estimated gestational age of [redacted] weeks 2 days by LMP.  OBSTETRIC <14 WK [place] AND TRANSVAGINAL OB US  Technique:  Both transabdominal and transvaginal ultrasound examinations were performed for complete evaluation of the gestation as well as the maternal uterus, adnexal regions, and pelvic cul-de-sac.  Transvaginal technique was performed to assess early pregnancy.  Comparison:  None  Intrauterine gestational sac:  Visualized and normal in shape Yolk sac: Visualized Embryo: Not visualized  MSD: 8.5 mm  5 w 5 d          [place] EDC: 03/09/2014  Maternal uterus/adnexae: There is no evidence of subchorionic hemorrhage. Two separate adjacent simple appearing cyst within the right ovary are noted, the largest measuring 2 x 2.8 cm. The left ovary is unremarkable. A trace  amount of free fluid is noted.  There is no evidence of solid adnexal mass.  IMPRESSION: Single intrauterine gestational sac with yolk sac. No embryo identified at this time. Estimated gestational age of [redacted] weeks 5 days by this ultrasound.  No evidence of subchorionic hemorrhage.  Simple appearing right ovarian cysts.   Original Report Authenticated By: Harmon Pier, M.D.   1. Threatened miscarriage     MDM  Pt presenting with c/o abdominal pain.  Urine preg test positive.  Pelvic ultrasound shows + IUP with gestational sac and yolk sac present.  Pelvic reveals os closed, no adnexal tenderness or masses.  Pt counseled of importance of OB followup, started on prenatal vitamins.   Discharged with strict return precautions.  Pt agreeable with plan.  Malva Cogan  Karma Ganja, MD 07/13/13 2201

## 2013-07-12 NOTE — ED Notes (Signed)
Pt states she has diffused abdominal pain for two days,  No diarrhea, but nausea

## 2013-07-12 NOTE — ED Notes (Signed)
Pt taken to ultrasound

## 2013-07-13 LAB — WET PREP, GENITAL

## 2013-07-13 MED ORDER — PRENATAL VITAMINS (DIS) PO TABS: 1.0000 | ORAL_TABLET | ORAL | Status: DC

## 2013-07-14 LAB — GC/CHLAMYDIA PROBE AMP
CT Probe RNA: NEGATIVE
GC Probe RNA: NEGATIVE

## 2013-07-21 ENCOUNTER — Inpatient Hospital Stay (HOSPITAL_COMMUNITY)
Admission: AD | Admit: 2013-07-21 | Discharge: 2013-07-21 | Disposition: A | Discharge status: Home / Self Care | Payer: MEDICAID | Source: Ambulatory Visit | Attending: Obstetrics and Gynecology | Admitting: Obstetrics and Gynecology

## 2013-07-21 ENCOUNTER — Inpatient Hospital Stay (HOSPITAL_COMMUNITY): Payer: Self-pay

## 2013-07-21 ENCOUNTER — Encounter (HOSPITAL_COMMUNITY): Payer: Self-pay

## 2013-07-21 DIAGNOSIS — O209 Hemorrhage in early pregnancy, unspecified: Secondary | ICD-10-CM | POA: Insufficient documentation

## 2013-07-21 DIAGNOSIS — O4690 Antepartum hemorrhage, unspecified, unspecified trimester: Secondary | ICD-10-CM

## 2013-07-21 DIAGNOSIS — R109 Unspecified abdominal pain: Secondary | ICD-10-CM | POA: Insufficient documentation

## 2013-07-21 LAB — URINALYSIS, ROUTINE W REFLEX MICROSCOPIC
Glucose, UA: NEGATIVE mg/dL
Leukocytes, UA: NEGATIVE
Nitrite: NEGATIVE
Specific Gravity, Urine: >1.03 — ABNORMAL HIGH (ref 1.005–1.030)
pH: 6 (ref 5.0–8.0)

## 2013-07-21 LAB — CBC
MCH: 29 pg (ref 26.0–34.0)
MCHC: 34.6 g/dL (ref 30.0–36.0)
MCV: 83.8 fL (ref 78.0–100.0)
Platelets: 276 10*3/uL (ref 150–400)

## 2013-07-21 LAB — URINE MICROSCOPIC-ADD ON

## 2013-07-21 LAB — HCG, QUANTITATIVE, PREGNANCY: hCG, Beta Chain, Quant, S: 35324 m[IU]/mL — ABNORMAL HIGH (ref <5)

## 2013-07-21 LAB — WET PREP, GENITAL

## 2013-07-21 NOTE — MAU Provider Note (Signed)
History     CSN: [id number]  Arrival date and time: 07/21/13 4098   First Provider Initiated Contact with Patient 07/21/13 2026      Chief Complaint  Patient presents with  . Abdominal Pain  . Back Pain  . Vaginal Bleeding   Abdominal Pain  Back Pain Associated symptoms include abdominal pain.  Vaginal Bleeding Associated symptoms include abdominal pain and back pain.    Cathy Nash is a 32 y.o. [id number] who presents today with cramping. She states that the cramping started about a week ago, and then today she had some pink discharge as well. She denies any dysuria or urinary symptoms.   Past Medical History  Diagnosis Date  . Asthma   . Preterm labor     1st pregnancy d/t pre-e  . Pregnancy induced hypertension     previous pregnancy  . Pyelonephritis     June 2013    Past Surgical History  Procedure Laterality Date  . No past surgeries      Family History  Problem Relation Age of Onset  . Anesthesia problems Neg Hx   . Hypotension Neg Hx   . Malignant hyperthermia Neg Hx   . Pseudochol deficiency Neg Hx   . Other Neg Hx   . Hypertension Mother   . Asthma Father     History  Substance Use Topics  . Smoking status: Never Smoker   . Smokeless tobacco: Never Used  . Alcohol Use: No     Comment: a little when not pregnant    Allergies:  Allergies  Allergen Reactions  . Iohexol      Code: SOB, Desc: IMMEDIATELY SOB FOLLOWING IV INJECTION, SPOTTY HIVES, PT GIVEN BENADRYL AND EPI-ARS 08/22/09, Onset Date: 29562130     Prescriptions prior to admission  Medication Sig Dispense Refill  . acetaminophen (TYLENOL) 325 MG tablet Take 325 mg by mouth every 6 (six) hours as needed for pain.      . Prenatal Vit-Fe Fumarate-FA (PRENATAL MULTIVITAMIN) TABS Take 1 tablet by mouth daily at 12 noon.        Review of Systems  Gastrointestinal: Positive for abdominal pain.  Genitourinary: Positive for vaginal bleeding.  Musculoskeletal: Positive for back  pain.   Physical Exam   Blood pressure 102/57, pulse 72, temperature 97.7 F (36.5 C), temperature source Oral, resp. rate 18, height 5' 5.1" (1.654 m), weight 57.698 kg (127 lb 3.2 oz).  Physical Exam  Nursing note and vitals reviewed. Constitutional: She is oriented to person, place, and time. She appears well-developed and well-nourished. No distress.  Cardiovascular: Normal rate.   Respiratory: Effort normal.  GI: Soft. There is no tenderness.  Genitourinary:   External: no lesion Vagina: small amount of white discharge Cervix: pink, smooth, no CMT Uterus: slightly enlarged  Adnexa: NT   Neurological: She is alert and oriented to person, place, and time.  Skin: Skin is warm and dry.  Psychiatric: She has a normal mood and affect.    MAU Course  Procedures  Results for orders placed during the hospital encounter of 07/21/13 (from the past 24 hour(s))  URINALYSIS, ROUTINE W REFLEX MICROSCOPIC     Status: Abnormal   Collection Time    07/21/13  7:35 PM      Result Value Range   Color, Urine YELLOW  YELLOW   APPearance CLEAR  CLEAR   Specific Gravity, Urine >1.030 (*) 1.005 - 1.030   pH 6.0  5.0 - 8.0   Glucose, UA NEGATIVE  NEGATIVE mg/dL   Hgb urine dipstick SMALL (*) NEGATIVE   Bilirubin Urine NEGATIVE  NEGATIVE   Ketones, ur NEGATIVE  NEGATIVE mg/dL   Protein, ur NEGATIVE  NEGATIVE mg/dL   Urobilinogen, UA 0.2  0.0 - 1.0 mg/dL   Nitrite NEGATIVE  NEGATIVE   Leukocytes, UA NEGATIVE  NEGATIVE  URINE MICROSCOPIC-ADD ON     Status: Abnormal   Collection Time    07/21/13  7:35 PM      Result Value Range   Squamous Epithelial / LPF FEW (*) RARE   WBC, UA 0-2  <3 WBC/hpf   RBC / HPF 3-6  <3 RBC/hpf   Bacteria, UA FEW (*) RARE  POCT PREGNANCY, URINE     Status: Abnormal   Collection Time    07/21/13  8:19 PM      Result Value Range   Preg Test, Ur POSITIVE (*) NEGATIVE  WET PREP, GENITAL     Status: Abnormal   Collection Time    07/21/13  8:35 PM      Result  Value Range   Yeast Wet Prep HPF POC NONE SEEN  NONE SEEN   Trich, Wet Prep NONE SEEN  NONE SEEN   Clue Cells Wet Prep HPF POC NONE SEEN  NONE SEEN   WBC, Wet Prep HPF POC FEW (*) NONE SEEN  CBC     Status: Abnormal   Collection Time    07/21/13  8:43 PM      Result Value Range   WBC 7.5  4.0 - 10.5 K/uL   RBC 4.00  3.87 - 5.11 MIL/uL   Hemoglobin 11.6 (*) 12.0 - 15.0 g/dL   HCT 16.1 (*) 09.6 - 04.5 %   MCV 83.8  78.0 - 100.0 fL   MCH 29.0  26.0 - 34.0 pg   MCHC 34.6  30.0 - 36.0 g/dL   RDW 40.9 (*) 81.1 - 91.4 %   Platelets 276  150 - 400 K/uL  HCG, QUANTITATIVE, PREGNANCY     Status: Abnormal   Collection Time    07/21/13  8:43 PM      Result Value Range   hCG, Beta Chain, Quant, S [id number] (*) <5 mIU/mL   US Ob Comp Less 14 Wks  07/21/2013   *RADIOLOGY REPORT*  Clinical Data: Positive pregnancy test, vaginal bleeding, pain  OBSTETRIC <14 WK ULTRASOUND  Technique:  Transabdominal ultrasound was performed for evaluation of the gestation as well as the maternal uterus and adnexal regions.  Comparison:  None.  Intrauterine gestational sac: Visualized/normal in shape. Yolk sac: Visualized Embryo: Visualized Cardiac Activity: Visualized Heart Rate: 115 bpm  CRL:  8 mm  6 w  5 d          [place] EDC: 03/11/14  Maternal uterus/Adnexae: The ovaries are normal.  IMPRESSION: Single live intrauterine gestation with concordant dating compared to assigned gestational age by LMP of 6 weeks 4 days, EDC by LMP 03/12/2013.   Original Report Authenticated By: Christiana Pellant, M.D.     Assessment and Plan   1. Vaginal bleeding in pregnancy, first trimester    First trimester danger signs reviewed Start [facility] as soon as possible Return to MAU as needed OK to resume normal work activities  Tawnya Crook 07/21/2013, 8:38 PM

## 2013-07-21 NOTE — MAU Note (Signed)
Pt states abdominal pain and back pain began last night. States some spotting when she wipes.

## 2013-07-22 LAB — GC/CHLAMYDIA PROBE AMP: GC Probe RNA: NEGATIVE

## 2013-07-26 NOTE — MAU Provider Note (Signed)
Attestation of Attending Supervision of Advanced Practitioner: Evaluation and management procedures were performed by the PA/NP/CNM/OB Fellow under my supervision/collaboration. Chart reviewed and agree with management and plan.  Cathy Nash V 07/26/2013 5:50 AM

## 2013-08-23 ENCOUNTER — Encounter (HOSPITAL_COMMUNITY): Payer: Self-pay | Admitting: *Deleted

## 2013-08-23 ENCOUNTER — Inpatient Hospital Stay (HOSPITAL_COMMUNITY)
Admission: AD | Admit: 2013-08-23 | Discharge: 2013-08-24 | Disposition: A | Discharge status: Home / Self Care | Payer: MEDICAID | Source: Ambulatory Visit | Attending: Obstetrics & Gynecology | Admitting: Obstetrics & Gynecology

## 2013-08-23 DIAGNOSIS — R51 Headache: Secondary | ICD-10-CM | POA: Insufficient documentation

## 2013-08-23 DIAGNOSIS — I8390 Asymptomatic varicose veins of unspecified lower extremity: Secondary | ICD-10-CM

## 2013-08-23 DIAGNOSIS — O2200 Varicose veins of lower extremity in pregnancy, unspecified trimester: Secondary | ICD-10-CM | POA: Insufficient documentation

## 2013-08-23 DIAGNOSIS — R109 Unspecified abdominal pain: Secondary | ICD-10-CM | POA: Insufficient documentation

## 2013-08-23 DIAGNOSIS — N949 Unspecified condition associated with female genital organs and menstrual cycle: Secondary | ICD-10-CM

## 2013-08-23 DIAGNOSIS — K219 Gastro-esophageal reflux disease without esophagitis: Secondary | ICD-10-CM | POA: Insufficient documentation

## 2013-08-23 DIAGNOSIS — O99891 Other specified diseases and conditions complicating pregnancy: Secondary | ICD-10-CM | POA: Insufficient documentation

## 2013-08-23 LAB — URINALYSIS, ROUTINE W REFLEX MICROSCOPIC
Glucose, UA: NEGATIVE mg/dL
Ketones, ur: NEGATIVE mg/dL
Leukocytes, UA: NEGATIVE
Nitrite: NEGATIVE
Protein, ur: NEGATIVE mg/dL
pH: 6 (ref 5.0–8.0)

## 2013-08-23 LAB — URINE MICROSCOPIC-ADD ON

## 2013-08-23 NOTE — MAU Note (Signed)
Pain in lower stomach for an hour now. Worse with walking. Cramping pain in upper legs.

## 2013-08-23 NOTE — MAU Note (Addendum)
PT SAYS  SHE HAS UPPER ABD PAIN  WHEN SHE EATS  THAT STARTED AT 2300  AND HAS LOWER ABD PAIN  WHEN SHE WALKS.   NO VOMITING.,  NO NAUSEA.   HAS LOOSE BM.    STILL  HURTS NOW - SAME.     HAS  AN APPOINTMENT  ON Monday WITH [facility] MOM  FOR DR ASSIGNMENT.  LAST SEX- 1 WEEK AGO.

## 2013-08-24 ENCOUNTER — Encounter (HOSPITAL_COMMUNITY): Payer: Self-pay | Admitting: *Deleted

## 2013-08-24 LAB — CBC
HCT: 30.5 % — ABNORMAL LOW (ref 36.0–46.0)
Hemoglobin: 10.9 g/dL — ABNORMAL LOW (ref 12.0–15.0)
MCH: 29.8 pg (ref 26.0–34.0)
MCHC: 35.7 g/dL (ref 30.0–36.0)
RDW: 15.6 % — ABNORMAL HIGH (ref 11.5–15.5)

## 2013-08-24 LAB — WET PREP, GENITAL: Trich, Wet Prep: NONE SEEN

## 2013-08-24 MED ORDER — OXYCODONE-ACETAMINOPHEN 5-325 MG PO TABS
1.0000 | ORAL_TABLET | Freq: Once | ORAL | Status: AC
  Administered 2013-08-24: 1 via ORAL
  Filled 2013-08-24: qty 1

## 2013-08-24 MED ORDER — FAMOTIDINE 20 MG PO TABS: 20.0000 mg | ORAL_TABLET | Freq: Two times a day (BID) | ORAL | Status: DC

## 2013-08-24 MED ORDER — ONDANSETRON 8 MG PO TBDP
8.0000 mg | ORAL_TABLET | Freq: Once | ORAL | Status: AC
  Administered 2013-08-24: 8 mg via ORAL
  Filled 2013-08-24: qty 1

## 2013-08-24 MED ORDER — ACETAMINOPHEN-CODEINE #3 300-30 MG PO TABS: 1.0000 | ORAL_TABLET | Freq: Four times a day (QID) | ORAL | Status: DC | PRN

## 2013-08-24 MED ORDER — GI COCKTAIL ~~LOC~~
30.0000 mL | Freq: Once | ORAL | Status: AC
  Administered 2013-08-24: 30 mL via ORAL
  Filled 2013-08-24: qty 30

## 2013-08-24 NOTE — MAU Provider Note (Signed)
History     CSN: [id number]  Arrival date and time: 08/23/13 2327   None     Chief Complaint  Patient presents with  . Abdominal Pain  . Headache   HPI Comments: Cathy Nash 32 [id number] presents to MAU for abdominal pains that started tonight. She feels like it has 2 parts one is reflux and one is cramping in lower abdomen, both started after she had dinner. It has happened after dinner prior to this. She is 11 weeks and 4 days pregnant. 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     Abdominal Pain Associated symptoms include headaches and nausea.  Headache  Associated symptoms include abdominal pain and nausea.      Past Medical History  Diagnosis Date  . Asthma   . Preterm labor     1st pregnancy d/t pre-e  . Pregnancy induced hypertension     previous pregnancy  . Pyelonephritis     June 2013    Past Surgical History  Procedure Laterality Date  . No past surgeries      Family History  Problem Relation Age of Onset  . Anesthesia problems Neg Hx   . Hypotension Neg Hx   . Malignant hyperthermia Neg Hx   . Pseudochol deficiency Neg Hx   . Other Neg Hx   . Hypertension Mother   . Asthma Father     History  Substance Use Topics  . Smoking status: Never Smoker   . Smokeless tobacco: Never Used  . Alcohol Use: No     Comment: a little when not pregnant    Allergies:  Allergies  Allergen Reactions  . Iohexol      Code: SOB, Desc: IMMEDIATELY SOB FOLLOWING IV INJECTION, SPOTTY HIVES, PT GIVEN BENADRYL AND EPI-ARS 08/22/09, Onset Date: [id number]     Prescriptions prior to admission  Medication Sig Dispense Refill  . acetaminophen (TYLENOL) 325 MG tablet Take 325 mg by mouth every 6 (six) hours as needed for pain.      . Prenatal Vit-Fe Fumarate-FA (PRENATAL MULTIVITAMIN) TABS Take 1 tablet by mouth daily at 12 noon.        Review of Systems  Constitutional: Negative.   Eyes: Negative.   Respiratory: Negative.    Cardiovascular: Negative.   Gastrointestinal: Positive for heartburn, nausea and abdominal pain.  Genitourinary: Negative.   Musculoskeletal: Negative.   Skin: Negative.   Neurological: Positive for headaches.  Psychiatric/Behavioral: Negative.    Physical Exam   Blood pressure 100/51, pulse 72, temperature 97.7 F (36.5 C), resp. rate 20, height 5\' 3"  (1.6 m), weight 125 lb 6.4 oz (56.881 kg), last menstrual period 04/30/2013, unknown if currently breastfeeding.  Physical Exam  Constitutional: She is oriented to person, place, and time. She appears well-developed and well-nourished.  HENT:  Head: Normocephalic and atraumatic.  Cardiovascular: Normal rate, regular rhythm and normal heart sounds.   Respiratory: Effort normal and breath sounds normal. No respiratory distress. She has no wheezes. She has no rales.  GI: Soft. Bowel sounds are normal. She exhibits no distension. There is tenderness. There is no rebound and no guarding.  Genitourinary: Vagina normal and uterus normal. No vaginal discharge found.  Cervix closed Tenderness at ligaments   Musculoskeletal: Normal range of motion.  Neurological: She is alert and oriented to person, place, and time.  Skin: Skin is warm and dry.  Varicose veins in inner thighs  Psychiatric: She has a normal mood and affect. Her behavior is normal.  Results for orders placed during the hospital encounter of 08/23/13 (from the past 24 hour(s))  URINALYSIS, ROUTINE W REFLEX MICROSCOPIC     Status: Abnormal   Collection Time    08/23/13 11:45 PM      Result Value Range   Color, Urine YELLOW  YELLOW   APPearance CLEAR  CLEAR   Specific Gravity, Urine >1.030 (*) 1.005 - 1.030   pH 6.0  5.0 - 8.0   Glucose, UA NEGATIVE  NEGATIVE mg/dL   Hgb urine dipstick TRACE (*) NEGATIVE   Bilirubin Urine NEGATIVE  NEGATIVE   Ketones, ur NEGATIVE  NEGATIVE mg/dL   Protein, ur NEGATIVE  NEGATIVE mg/dL   Urobilinogen, UA 0.2  0.0 - 1.0 mg/dL   Nitrite  NEGATIVE  NEGATIVE   Leukocytes, UA NEGATIVE  NEGATIVE  URINE MICROSCOPIC-ADD ON     Status: None   Collection Time    08/23/13 11:45 PM      Result Value Range   Squamous Epithelial / LPF RARE  RARE   WBC, UA 0-2  <3 WBC/hpf   RBC / HPF 0-2  <3 RBC/hpf   Urine-Other MUCOUS PRESENT    CBC     Status: Abnormal   Collection Time    08/24/13 12:25 AM      Result Value Range   WBC 7.6  4.0 - 10.5 K/uL   RBC 3.66 (*) 3.87 - 5.11 MIL/uL   Hemoglobin 10.9 (*) 12.0 - 15.0 g/dL   HCT 16.1 (*) 09.6 - 04.5 %   MCV 83.3  78.0 - 100.0 fL   MCH 29.8  26.0 - 34.0 pg   MCHC 35.7  30.0 - 36.0 g/dL   RDW 40.9 (*) 81.1 - 91.4 %   Platelets 256  150 - 400 K/uL  WET PREP, GENITAL     Status: Abnormal   Collection Time    08/24/13  1:45 AM      Result Value Range   Yeast Wet Prep HPF POC NONE SEEN  NONE SEEN   Trich, Wet Prep NONE SEEN  NONE SEEN   Clue Cells Wet Prep HPF POC FEW (*) NONE SEEN   WBC, Wet Prep HPF POC MODERATE (*) NONE SEEN     MAU Course  Procedures  MDM CBC, GI cocktail, zofran, percocet Wet prep, GC/ chlamydia  Assessment and Plan  A: Reflux Round ligament pains Varicose veins  P: Above labs and medications Tylenol # 3 q 6 hours prn pain Pepcid 20 mg BID prn Needs to establish prenatal care  Carolynn Serve 08/24/2013, 2:47 AM

## 2013-08-25 LAB — GC/CHLAMYDIA PROBE AMP: CT Probe RNA: NEGATIVE

## 2013-08-31 ENCOUNTER — Encounter (HOSPITAL_COMMUNITY): Payer: Self-pay | Admitting: *Deleted

## 2013-08-31 ENCOUNTER — Inpatient Hospital Stay (HOSPITAL_COMMUNITY)
Admission: AD | Admit: 2013-08-31 | Discharge: 2013-09-01 | Disposition: A | Discharge status: Home / Self Care | Payer: Self-pay | Source: Ambulatory Visit | Attending: Obstetrics & Gynecology | Admitting: Obstetrics & Gynecology

## 2013-08-31 DIAGNOSIS — O209 Hemorrhage in early pregnancy, unspecified: Secondary | ICD-10-CM | POA: Insufficient documentation

## 2013-08-31 DIAGNOSIS — R109 Unspecified abdominal pain: Secondary | ICD-10-CM | POA: Insufficient documentation

## 2013-08-31 NOTE — MAU Note (Signed)
Started having bloody d/c at 1600. Now having alittle more and now my lower stomach and back are hurting. Have a pain when I walk in my vaginal area.

## 2013-09-01 ENCOUNTER — Inpatient Hospital Stay (HOSPITAL_COMMUNITY): Payer: Medicaid Other

## 2013-09-01 DIAGNOSIS — O4690 Antepartum hemorrhage, unspecified, unspecified trimester: Secondary | ICD-10-CM

## 2013-09-01 LAB — URINE MICROSCOPIC-ADD ON

## 2013-09-01 LAB — URINALYSIS, ROUTINE W REFLEX MICROSCOPIC
Bilirubin Urine: NEGATIVE
Ketones, ur: NEGATIVE mg/dL
Protein, ur: NEGATIVE mg/dL
Specific Gravity, Urine: >1.03 — ABNORMAL HIGH (ref 1.005–1.030)
Urobilinogen, UA: 0.2 mg/dL (ref 0.0–1.0)

## 2013-09-01 NOTE — Progress Notes (Signed)
Spec exam done. Scant amt blood noted. Raquel, in-house spanish interpreter present

## 2013-09-01 NOTE — MAU Provider Note (Signed)
History     CSN: [id number]  Arrival date and time: 08/31/13 2342   None     Chief Complaint  Patient presents with  . Abdominal Pain  . Vaginal Bleeding   HPI This is a 32 y.o. female at [redacted]w[redacted]d who presents with c/o bleeding since 1600. ALso has some uterine cramping. Had similar problems last pregnancy. Also concerned due to history of pylelonephritis.  RN Note: Started having bloody d/c at 1600. Now having alittle more and now my lower stomach and back are hurting. Have a pain when I walk in my vaginal area.       OB History   Grav Para Term Preterm Abortions TAB SAB Ect Mult Living   3 2 1 1  0 0 0 0 0 2      Past Medical History  Diagnosis Date  . Asthma   . Preterm labor     1st pregnancy d/t pre-e  . Pregnancy induced hypertension     previous pregnancy  . Pyelonephritis     June 2013    Past Surgical History  Procedure Laterality Date  . No past surgeries      Family History  Problem Relation Age of Onset  . Anesthesia problems Neg Hx   . Hypotension Neg Hx   . Malignant hyperthermia Neg Hx   . Pseudochol deficiency Neg Hx   . Other Neg Hx   . Hypertension Mother   . Asthma Father     History  Substance Use Topics  . Smoking status: Never Smoker   . Smokeless tobacco: Never Used  . Alcohol Use: No     Comment: a little when not pregnant    Allergies:  Allergies  Allergen Reactions  . Iohexol      Code: SOB, Desc: IMMEDIATELY SOB FOLLOWING IV INJECTION, SPOTTY HIVES, PT GIVEN BENADRYL AND EPI-ARS 08/22/09, Onset Date: [id number]     Prescriptions prior to admission  Medication Sig Dispense Refill  . acetaminophen (TYLENOL) 325 MG tablet Take 325 mg by mouth every 6 (six) hours as needed for pain.      Marland Kitchen acetaminophen-codeine (TYLENOL #3) 300-30 MG per tablet Take 1-2 tablets by mouth every 6 (six) hours as needed for pain.  15 tablet  0  . famotidine (PEPCID) 20 MG tablet Take 1 tablet (20 mg total) by mouth 2 (two) times daily.  30  tablet  0  . Prenatal Vit-Fe Fumarate-FA (PRENATAL MULTIVITAMIN) TABS Take 1 tablet by mouth daily at 12 noon.        Review of Systems  Constitutional: Negative for fever, chills and malaise/fatigue.  Gastrointestinal: Positive for abdominal pain. Negative for nausea, vomiting, diarrhea and constipation.  Genitourinary: Negative for dysuria.       Vaginal bleeding    Physical Exam   Blood pressure 116/65, pulse 78, temperature 97.5 F (36.4 C), resp. rate 20, height 5\' 2"  (1.575 m), weight 58.514 kg (129 lb), last menstrual period 04/30/2013.  Physical Exam  Constitutional: She is oriented to person, place, and time. She appears well-developed and well-nourished. No distress.  Cardiovascular: Normal rate.   Respiratory: Effort normal.  GI: Soft. She exhibits no distension and no mass. There is no tenderness. There is no rebound and no guarding.  Genitourinary: Uterus normal. Vaginal discharge (small amt dark blood, cervix long and closed, uterus c/w dates, sl tender) found.  Musculoskeletal: Normal range of motion.  Neurological: She is alert and oriented to person, place, and time.  Skin: Skin is  warm and dry.  Psychiatric: She has a normal mood and affect.    MAU Course  Procedures  MDM [place] normal, FHR 140, no evidence of SCH.   Assessment and Plan  A:  SIUP at 104w5d      First trimester bleeding  P:  Discharge home      Reassured      Start prenatal care.  [facility] 09/01/2013, 12:45 AM

## 2013-09-01 NOTE — Progress Notes (Signed)
[name] CNM in to discuss u/s results and d/c plan. Written and verbal d/c instructions given and understanding voiced. 

## 2013-09-25 ENCOUNTER — Emergency Department (HOSPITAL_COMMUNITY)
Admission: EM | Admit: 2013-09-25 | Discharge: 2013-09-26 | Disposition: A | Discharge status: Home / Self Care | Payer: Medicaid Other | Attending: Emergency Medicine | Admitting: Emergency Medicine

## 2013-09-25 ENCOUNTER — Encounter (HOSPITAL_COMMUNITY): Payer: Self-pay | Admitting: Emergency Medicine

## 2013-09-25 DIAGNOSIS — O210 Mild hyperemesis gravidarum: Secondary | ICD-10-CM | POA: Insufficient documentation

## 2013-09-25 DIAGNOSIS — M545 Low back pain, unspecified: Secondary | ICD-10-CM | POA: Insufficient documentation

## 2013-09-25 DIAGNOSIS — J45909 Unspecified asthma, uncomplicated: Secondary | ICD-10-CM | POA: Insufficient documentation

## 2013-09-25 DIAGNOSIS — O99891 Other specified diseases and conditions complicating pregnancy: Secondary | ICD-10-CM | POA: Insufficient documentation

## 2013-09-25 DIAGNOSIS — O9989 Other specified diseases and conditions complicating pregnancy, childbirth and the puerperium: Secondary | ICD-10-CM | POA: Insufficient documentation

## 2013-09-25 DIAGNOSIS — Z87448 Personal history of other diseases of urinary system: Secondary | ICD-10-CM | POA: Insufficient documentation

## 2013-09-25 DIAGNOSIS — R109 Unspecified abdominal pain: Secondary | ICD-10-CM | POA: Insufficient documentation

## 2013-09-25 DIAGNOSIS — M549 Dorsalgia, unspecified: Secondary | ICD-10-CM

## 2013-09-25 DIAGNOSIS — N949 Unspecified condition associated with female genital organs and menstrual cycle: Secondary | ICD-10-CM | POA: Insufficient documentation

## 2013-09-25 MED ORDER — MORPHINE SULFATE 4 MG/ML IJ SOLN
4.0000 mg | Freq: Once | INTRAMUSCULAR | Status: AC
  Administered 2013-09-25: 4 mg via INTRAVENOUS
  Filled 2013-09-25: qty 1

## 2013-09-25 MED ORDER — ONDANSETRON HCL 4 MG/2ML IJ SOLN
4.0000 mg | Freq: Once | INTRAMUSCULAR | Status: AC
  Administered 2013-09-25: 4 mg via INTRAVENOUS
  Filled 2013-09-25: qty 2

## 2013-09-25 NOTE — ED Provider Notes (Signed)
CSN: [id number]     Arrival date & time 09/25/13  2140 History   First MD Initiated Contact with Patient 09/25/13 2303     Chief Complaint  Patient presents with  . Back Pain   (Consider location/radiation/quality/duration/timing/severity/associated sxs/prior Treatment) HPI 32 year old [redacted] week pregnant female presents today with lower abdominal pain that radiates to her back x 6-7 hours. 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 She has not tried anything to relieve it. She states she was more stressed than normal today. She vomited one time today immediately after she ate. She has had a headache for 3 days now. She has had similar abdominal pain 1.5 months ago, and she went to [facility] where they did an ultrasound and confirmed that there were no issues with the pregnancy. She denies fever and chills, denies urinary symptoms, vaginal discharge and bleeding, nausea, diarrhea, dizziness, lightheadedness, shortness of breath, cough, and chest pain.    History reviewed. No pertinent past medical history. History reviewed. No pertinent past surgical history. History reviewed. No pertinent family history. History  Substance Use Topics  . Smoking status: Never Smoker   . Smokeless tobacco: Not on file  . Alcohol Use: No   OB History   Grav Para Term Preterm Abortions TAB SAB Ect Mult Living   1              Review of Systems  Constitutional: Negative for fever and chills.  Respiratory: Negative for cough and shortness of breath.   Cardiovascular: Negative for chest pain and leg swelling.  Gastrointestinal: Positive for vomiting and abdominal pain. Negative for nausea, diarrhea and abdominal distention.  Genitourinary: Positive for pelvic pain. Negative for dysuria, urgency, hematuria, vaginal bleeding and vaginal discharge.  Musculoskeletal: Positive for back pain.  Skin: Negative for rash and wound.   Neurological: Negative for dizziness and light-headedness.    Allergies  Review of patient's allergies indicates no known allergies.  Home Medications  No current outpatient prescriptions on file. BP 108/67  Pulse 76  Temp(Src) 97.9 F (36.6 C) (Oral)  Resp 20  SpO2 100% Physical Exam  Nursing note and vitals reviewed. Constitutional: She is oriented to person, place, and time. She appears well-developed and well-nourished. No distress.  Appearing uncomfortable  HENT:  Head: Normocephalic and atraumatic.  Mouth/Throat: Oropharynx is clear and moist.  Eyes: Conjunctivae are normal.  Neck: Normal range of motion. Neck supple.  Cardiovascular: Normal rate and regular rhythm.   Pulmonary/Chest: Effort normal and breath sounds normal. No respiratory distress. She has no wheezes. She has no rales. She exhibits no tenderness.  Abdominal: Soft. Bowel sounds are normal. She exhibits no distension. There is no tenderness.  Uterus palpated midway between pubic bone and umbilicus.  Patient has tenderness to palpation of bilateral lower abdomen; tenderness is worst in the suprapubic region.  Positive for CVA tenderness bilaterally, R>L    Genitourinary: Vagina normal and uterus normal. There is no rash or lesion on the right labia. There is no rash or lesion on the left labia. Cervix exhibits no motion tenderness and no discharge. Right adnexum displays tenderness. Right adnexum displays no mass. Left adnexum displays tenderness. Left adnexum displays no mass. No erythema, tenderness or bleeding around the vagina. No vaginal discharge found.  Chaperone present:  Musculoskeletal:  Tenderness to palpation of lumbar spine  Lymphadenopathy:  Right: No inguinal adenopathy present.       Left: No inguinal adenopathy present.  Neurological: She is alert and oriented to person, place, and time.  Skin: Skin is warm and dry.  Psychiatric: She has a normal mood and affect.    ED Course   Procedures (including critical care time)\  11:46 PM Pt is pregnant, presents with suprapubic and R flank pain without dysuria.  Moderate tenderness to bilateral adnexa and CMT tenderness on pelvic exam.   1:05 AM Wet prep result unremarkable.  Doubt PID.  Pt report resolution of pain after receiving pain meds.  Currently awaits OB US for further evaluation.  Care discussed with attending.  Oncoming PA will continue to monitor pt and dispo pending [place] result.    Labs Review Labs Reviewed  WET PREP, GENITAL - Abnormal; Notable for the following:    WBC, Wet Prep HPF POC FEW (*)    All other components within normal limits  PREGNANCY, URINE - Abnormal; Notable for the following:    Preg Test, Ur POSITIVE (*)    All other components within normal limits  URINALYSIS, ROUTINE W REFLEX MICROSCOPIC - Abnormal; Notable for the following:    APPearance CLOUDY (*)    All other components within normal limits  CBC WITH DIFFERENTIAL - Abnormal; Notable for the following:    RBC 3.40 (*)    Hemoglobin 10.5 (*)    HCT 29.6 (*)    All other components within normal limits  COMPREHENSIVE METABOLIC PANEL - Abnormal; Notable for the following:    Sodium 132 (*)    Potassium 3.0 (*)    Creatinine, Ser 0.45 (*)    Albumin 3.1 (*)    Total Bilirubin 0.2 (*)    All other components within normal limits  HCG, QUANTITATIVE, PREGNANCY - Abnormal; Notable for the following:    hCG, Beta Chain, Quant, S [id number] (*)    All other components within normal limits  GC/CHLAMYDIA PROBE AMP  LIPASE, BLOOD  ABO/RH   Imaging Review No results found.  EKG Interpretation   None       MDM  No diagnosis found.  BP 108/67  Pulse 76  Temp(Src) 97.9 F (36.6 C) (Oral)  Resp 20  SpO2 100%      Fayrene Helper, PA-C 09/26/13 0106

## 2013-09-25 NOTE — ED Notes (Signed)
Patient with c/o bilateral lower abdominal pain and back pain that started today Patient is approximately [redacted] weeks pregnant Patient denies vaginal bleeding, discharge or cramping

## 2013-09-26 ENCOUNTER — Encounter (HOSPITAL_COMMUNITY): Payer: Self-pay | Admitting: *Deleted

## 2013-09-26 ENCOUNTER — Emergency Department (HOSPITAL_COMMUNITY): Payer: Medicaid Other

## 2013-09-26 LAB — URINALYSIS, ROUTINE W REFLEX MICROSCOPIC
Bilirubin Urine: NEGATIVE
Glucose, UA: NEGATIVE mg/dL
Hgb urine dipstick: NEGATIVE
Ketones, ur: NEGATIVE mg/dL
Specific Gravity, Urine: 1.018 (ref 1.005–1.030)
Urobilinogen, UA: 0.2 mg/dL (ref 0.0–1.0)
pH: 6.5 (ref 5.0–8.0)

## 2013-09-26 LAB — CBC WITH DIFFERENTIAL/PLATELET
Eosinophils Absolute: 0.2 10*3/uL (ref 0.0–0.7)
Eosinophils Relative: 2 % (ref 0–5)
HCT: 29.6 % — ABNORMAL LOW (ref 36.0–46.0)
Hemoglobin: 10.5 g/dL — ABNORMAL LOW (ref 12.0–15.0)
Lymphs Abs: 2.6 10*3/uL (ref 0.7–4.0)
MCH: 30.9 pg (ref 26.0–34.0)
MCV: 87.1 fL (ref 78.0–100.0)
Monocytes Absolute: 0.5 10*3/uL (ref 0.1–1.0)
Monocytes Relative: 7 % (ref 3–12)
Neutro Abs: 4 10*3/uL (ref 1.7–7.7)
Platelets: 251 10*3/uL (ref 150–400)
RBC: 3.4 MIL/uL — ABNORMAL LOW (ref 3.87–5.11)

## 2013-09-26 LAB — COMPREHENSIVE METABOLIC PANEL
AST: 18 U/L (ref 0–37)
Alkaline Phosphatase: 72 U/L (ref 39–117)
BUN: 7 mg/dL (ref 6–23)
Calcium: 8.9 mg/dL (ref 8.4–10.5)
Creatinine, Ser: 0.45 mg/dL — ABNORMAL LOW (ref 0.50–1.10)
GFR calc Af Amer: >90 mL/min (ref >90)
Glucose, Bld: 85 mg/dL (ref 70–99)
Potassium: 3 mEq/L — ABNORMAL LOW (ref 3.5–5.1)
Total Protein: 6.4 g/dL (ref 6.0–8.3)

## 2013-09-26 LAB — WET PREP, GENITAL
Clue Cells Wet Prep HPF POC: NONE SEEN
Trich, Wet Prep: NONE SEEN
Yeast Wet Prep HPF POC: NONE SEEN

## 2013-09-26 LAB — HCG, QUANTITATIVE, PREGNANCY: hCG, Beta Chain, Quant, S: 14878 m[IU]/mL — ABNORMAL HIGH (ref <5)

## 2013-09-26 LAB — LIPASE, BLOOD: Lipase: 49 U/L (ref 11–59)

## 2013-09-26 NOTE — ED Provider Notes (Signed)
Pt received in sign out from PA Browns at shift change.  Pt [redacted] weeks gestation with abdominal pain radiating to her back for the past several hours.  1 episode of emesis earlier today.  No fevers, sweats, chills, urinary sx, or vaginal complaints.  Labs and pelvic have been performed.  Plan:  U/s pending.  If negative d/c and FU with OB-GYN  Results for orders placed during the hospital encounter of 09/25/13  WET PREP, GENITAL      Result Value Range   Yeast Wet Prep HPF POC NONE SEEN  NONE SEEN   Trich, Wet Prep NONE SEEN  NONE SEEN   Clue Cells Wet Prep HPF POC NONE SEEN  NONE SEEN   WBC, Wet Prep HPF POC FEW (*) NONE SEEN  PREGNANCY, URINE      Result Value Range   Preg Test, Ur POSITIVE (*) NEGATIVE  URINALYSIS, ROUTINE W REFLEX MICROSCOPIC      Result Value Range   Color, Urine YELLOW  YELLOW   APPearance CLOUDY (*) CLEAR   Specific Gravity, Urine 1.018  1.005 - 1.030   pH 6.5  5.0 - 8.0   Glucose, UA NEGATIVE  NEGATIVE mg/dL   Hgb urine dipstick NEGATIVE  NEGATIVE   Bilirubin Urine NEGATIVE  NEGATIVE   Ketones, ur NEGATIVE  NEGATIVE mg/dL   Protein, ur NEGATIVE  NEGATIVE mg/dL   Urobilinogen, UA 0.2  0.0 - 1.0 mg/dL   Nitrite NEGATIVE  NEGATIVE   Leukocytes, UA NEGATIVE  NEGATIVE  CBC WITH DIFFERENTIAL      Result Value Range   WBC 7.3  4.0 - 10.5 K/uL   RBC 3.40 (*) 3.87 - 5.11 MIL/uL   Hemoglobin 10.5 (*) 12.0 - 15.0 g/dL   HCT 16.1 (*) 09.6 - 04.5 %   MCV 87.1  78.0 - 100.0 fL   MCH 30.9  26.0 - 34.0 pg   MCHC 35.5  30.0 - 36.0 g/dL   RDW 40.9  81.1 - 91.4 %   Platelets 251  150 - 400 K/uL   Neutrophils Relative % 54  43 - 77 %   Neutro Abs 4.0  1.7 - 7.7 K/uL   Lymphocytes Relative 36  12 - 46 %   Lymphs Abs 2.6  0.7 - 4.0 K/uL   Monocytes Relative 7  3 - 12 %   Monocytes Absolute 0.5  0.1 - 1.0 K/uL   Eosinophils Relative 2  0 - 5 %   Eosinophils Absolute 0.2  0.0 - 0.7 K/uL   Basophils Relative 0  0 - 1 %   Basophils Absolute 0.0  0.0 - 0.1 K/uL   COMPREHENSIVE METABOLIC PANEL      Result Value Range   Sodium 132 (*) 135 - 145 mEq/L   Potassium 3.0 (*) 3.5 - 5.1 mEq/L   Chloride 99  96 - 112 mEq/L   CO2 24  19 - 32 mEq/L   Glucose, Bld 85  70 - 99 mg/dL   BUN 7  6 - 23 mg/dL   Creatinine, Ser 7.82 (*) 0.50 - 1.10 mg/dL   Calcium 8.9  8.4 - 95.6 mg/dL   Total Protein 6.4  6.0 - 8.3 g/dL   Albumin 3.1 (*) 3.5 - 5.2 g/dL   AST 18  0 - 37 U/L   ALT 12  0 - 35 U/L   Alkaline Phosphatase 72  39 - 117 U/L   Total Bilirubin 0.2 (*) 0.3 - 1.2 mg/dL  GFR calc non Af Amer >90  >90 mL/min   GFR calc Af Amer >90  >90 mL/min  LIPASE, BLOOD      Result Value Range   Lipase 49  11 - 59 U/L  HCG, QUANTITATIVE, PREGNANCY      Result Value Range   hCG, Beta Chain, Quant, S [id number] (*) <5 mIU/mL  ABO/RH      Result Value Range   ABO/RH(D) O POS     US Ob Limited  09/26/2013   CLINICAL DATA:  Abdominal cramping  EXAM: LIMITED OBSTETRIC ULTRASOUND  FINDINGS: Number of Fetuses: 1  Heart Rate:  132 bpm  Movement: Yes  Presentation: Cephalic  Placental Location: Anterior  Previa: No  Amniotic Fluid (Subjective):  Within normal limits.  BPD:  3.5cm 16w 5d  MATERNAL FINDINGS:  Cervix:  Appears closed.  Uterus/Adnexae:  No abnormality visualized.  IMPRESSION: 16 week 5 day fetus with EDC of 03/08/2014. No visible abnormalities of the fetus based on this limited exam. See disclaimer below. No maternal abnormalities.  This exam is performed on an emergent basis and does not comprehensively evaluate fetal size, dating, or anatomy; follow-up complete OB US should be considered if further fetal assessment is warranted.   Electronically Signed   By: Davonna Belling M.D.   On: 09/26/2013 01:22    U/s with positive cardiac activity and proper dating, no fetal abnormalities identified.  Informed pt and husband of results, they acknowledged understanding.  Strongly encouraged closed FU with OB-GYN if sx continued.  Discussed plan with pt, she agreed.  Return  precautions advised.  Garlon Hatchet, PA-C 09/26/13 [phone]

## 2013-09-27 NOTE — ED Provider Notes (Signed)
Medical screening examination/treatment/procedure(s) were performed by non-physician practitioner and as supervising physician I was immediately available for consultation/collaboration.  Lurleen Soltero T Avina Eberle, MD 09/27/13 0727 

## 2013-09-27 NOTE — ED Provider Notes (Signed)
Medical screening examination/treatment/procedure(s) were performed by non-physician practitioner and as supervising physician I was immediately available for consultation/collaboration.  Toy Baker, MD 09/27/13 [phone]

## 2013-09-30 ENCOUNTER — Other Ambulatory Visit (HOSPITAL_COMMUNITY): Payer: Self-pay | Admitting: Physician Assistant

## 2013-09-30 DIAGNOSIS — Z3689 Encounter for other specified antenatal screening: Secondary | ICD-10-CM

## 2013-09-30 LAB — CULTURE, OB URINE: Urine Culture, OB: NO GROWTH

## 2013-09-30 LAB — CYSTIC FIBROSIS DIAGNOSTIC STUDY

## 2013-09-30 LAB — CYTOLOGY - PAP: Pap Smear: NEGATIVE

## 2013-09-30 LAB — OB RESULTS CONSOLE HIV ANTIBODY (ROUTINE TESTING): HIV: NONREACTIVE

## 2013-09-30 LAB — GLUCOSE TOLERANCE, 1 HOUR (50G) W/O FASTING: Glucose, GTT - 1 Hour: 116 mg/dL (ref ?–200)

## 2013-09-30 LAB — OB RESULTS CONSOLE GC/CHLAMYDIA: Gonorrhea: NEGATIVE

## 2013-10-08 ENCOUNTER — Other Ambulatory Visit (HOSPITAL_COMMUNITY): Payer: Self-pay | Admitting: Physician Assistant

## 2013-10-08 ENCOUNTER — Ambulatory Visit (HOSPITAL_COMMUNITY): Admission: RE | Admit: 2013-10-08 | Payer: Medicaid Other | Source: Ambulatory Visit

## 2013-10-08 ENCOUNTER — Ambulatory Visit (HOSPITAL_COMMUNITY)
Admission: RE | Admit: 2013-10-08 | Discharge: 2013-10-08 | Disposition: A | Discharge status: Home / Self Care | Payer: Medicaid Other | Source: Ambulatory Visit | Attending: Physician Assistant | Admitting: Physician Assistant

## 2013-10-08 DIAGNOSIS — Z3689 Encounter for other specified antenatal screening: Secondary | ICD-10-CM

## 2013-10-21 ENCOUNTER — Encounter (HOSPITAL_COMMUNITY): Payer: Self-pay | Admitting: Obstetrics and Gynecology

## 2013-10-21 ENCOUNTER — Inpatient Hospital Stay (HOSPITAL_COMMUNITY)
Admission: AD | Admit: 2013-10-21 | Discharge: 2013-10-21 | Disposition: A | Discharge status: Home / Self Care | Payer: Self-pay | Source: Ambulatory Visit | Attending: Obstetrics & Gynecology | Admitting: Obstetrics & Gynecology

## 2013-10-21 ENCOUNTER — Encounter: Payer: Self-pay | Admitting: Family Medicine

## 2013-10-21 DIAGNOSIS — O2390 Unspecified genitourinary tract infection in pregnancy, unspecified trimester: Secondary | ICD-10-CM | POA: Insufficient documentation

## 2013-10-21 DIAGNOSIS — B3731 Acute candidiasis of vulva and vagina: Secondary | ICD-10-CM | POA: Insufficient documentation

## 2013-10-21 DIAGNOSIS — B373 Candidiasis of vulva and vagina: Secondary | ICD-10-CM | POA: Insufficient documentation

## 2013-10-21 DIAGNOSIS — R1032 Left lower quadrant pain: Secondary | ICD-10-CM | POA: Insufficient documentation

## 2013-10-21 DIAGNOSIS — N949 Unspecified condition associated with female genital organs and menstrual cycle: Secondary | ICD-10-CM

## 2013-10-21 LAB — URINE MICROSCOPIC-ADD ON

## 2013-10-21 LAB — URINALYSIS, ROUTINE W REFLEX MICROSCOPIC
Ketones, ur: NEGATIVE mg/dL
Leukocytes, UA: NEGATIVE
Nitrite: NEGATIVE
pH: 6 (ref 5.0–8.0)

## 2013-10-21 LAB — WET PREP, GENITAL: Clue Cells Wet Prep HPF POC: NONE SEEN

## 2013-10-21 MED ORDER — FLUCONAZOLE 150 MG PO TABS
150.0000 mg | ORAL_TABLET | Freq: Once | ORAL | Status: AC
  Administered 2013-10-21: 150 mg via ORAL
  Filled 2013-10-21: qty 1

## 2013-10-21 MED ORDER — FLUCONAZOLE 150 MG PO TABS: 150.0000 mg | ORAL_TABLET | Freq: Once | ORAL | Status: DC

## 2013-10-21 NOTE — MAU Provider Note (Signed)
History     CSN: [id number]  Arrival date and time: 10/21/13 1251   First Provider Initiated Contact with Patient 10/21/13 1350      Chief Complaint  Patient presents with  . Abdominal Pain  . Vaginal Discharge   HPI  Ms. Nielson is a 32 yo G3P1102 who presents today at [redacted]w[redacted]d with c/o LLQ pain with ambulation and vaginal d/c.  Pt interviewed with Spanish interpreter present. Ms. Shawnie Dapper was receiving care at the HD, but was referred to the [facility].  She had an appointment scheduled for today, but had to change it due to work.  Her next appointment is scheduled for next week.  She has an allergy to iohexol, but no others known. She denies any medications other than Tylenol prn for HAs.  She is not a smoker and she denies any alcohol or drug use.   Ms. Blades reports that for about 2 weeks she has noticed LLQ pain with ambulation, especially walking and going up stairs.  The pain is improved with rest, and it does not radiate anywhere.  The pain is intermittent and feels like a cramping pain.  She has not tried any medications or conservative tx for her pain, it is alleviated with rest. Ms. Laski also reports increased vaginal discharge for the past 4 days. She describes the d/c as thick, white/yellow, and malodorous. It is associated with dysuria, dyspareunia, and increased vaginal itching.  She denies any vaginal bleeding or leakage of fluids.  She has begun feeling FM, and states that it has been less for the past 2 days; but she has had good FM while in the MAU.  FHT 161 at bedside.  She denies any HA, CP, SOB, N/V, diarrhea, flank pain, fever, chills, LE swelling or edema.  She does report increased constipation, with her last BM the day before yesterday.  She denies any exposure to STDs and is only sexually active with her husband.  She denies having anything like this before.   She is also concerned about increased varicosities that she has noticed on her R inner thigh.  She  does not remember having them with her previous pregnancies, but wanted to have the area looked at today.  She denies any trauma, swelling, or warmth to the area.  She does report some tenderness to the area with palpation and with ambulation, from her thighs touching together.  She has not tried anything for it.  She also denies any numbness or tingling down into her R leg/ foot.   OB History   Grav Para Term Preterm Abortions TAB SAB Ect Mult Living   3 2 1 1  0 0 0 0 0 2      Past Medical History  Diagnosis Date  . Asthma   . Preterm labor     1st pregnancy d/t pre-e  . Pregnancy induced hypertension     previous pregnancy  . Pyelonephritis     June 2013    Past Surgical History  Procedure Laterality Date  . No past surgeries      Family History  Problem Relation Age of Onset  . Anesthesia problems Neg Hx   . Hypotension Neg Hx   . Malignant hyperthermia Neg Hx   . Pseudochol deficiency Neg Hx   . Other Neg Hx   . Hypertension Mother   . Asthma Father     History  Substance Use Topics  . Smoking status: Never Smoker   . Smokeless tobacco: Not on  file  . Alcohol Use: No     Comment: a little when not pregnant    Allergies:  Allergies  Allergen Reactions  . Iohexol      Code: SOB, Desc: IMMEDIATELY SOB FOLLOWING IV INJECTION, SPOTTY HIVES, PT GIVEN BENADRYL AND EPI-ARS 08/22/09, Onset Date: 16109604     Prescriptions prior to admission  Medication Sig Dispense Refill  . acetaminophen (TYLENOL) 325 MG tablet Take 325 mg by mouth every 6 (six) hours as needed for moderate pain.       . Prenatal Vit-Fe Fumarate-FA (PRENATAL MULTIVITAMIN) TABS Take 1 tablet by mouth daily at 12 noon.        Review of Systems  Constitutional: Negative for fever and chills.  Eyes: Negative for blurred vision.  Respiratory: Negative for shortness of breath.   Cardiovascular: Negative for chest pain and leg swelling.  Gastrointestinal: Positive for abdominal pain and constipation.  Negative for heartburn, nausea, vomiting, diarrhea and blood in stool.       LLQ pain. Increased constipation, last BM 11/8  Genitourinary: Positive for dysuria. Negative for frequency and flank pain.  Musculoskeletal: Negative for back pain.  Neurological: Negative for headaches.   Physical Exam   Blood pressure 98/56, pulse 71, temperature 98 F (36.7 C), temperature source Oral, resp. rate 18, height 5\' 1"  (1.549 m), weight 58.684 kg (129 lb 6 oz), last menstrual period 04/30/2013.  Physical Exam  Constitutional: She is oriented to person, place, and time. She appears well-developed and well-nourished. No distress.  HENT:  Head: Normocephalic and atraumatic.  Eyes: EOM are normal.  Cardiovascular: Normal rate, regular rhythm, normal heart sounds and intact distal pulses.  Exam reveals no gallop and no friction rub.   No murmur heard. Respiratory: Effort normal and breath sounds normal. She has no wheezes.  GI: Bowel sounds are normal. She exhibits distension. She exhibits no mass. There is tenderness. There is no guarding.  Gravida abdomen; fundal height 19cm. Mild LLQ tenderness to palpation.   Genitourinary: Vaginal discharge found.  Pelvic exam: VULVA: normal appearing vulva with no masses, tenderness or lesions, VAGINA: vaginal discharge - white, copious, curd-like, odorless and thick, CERVIX: erythematous, cervical discharge present - white, copious, curd-like, malodorous and thick, DNA probe for chlamydia and GC obtained, cervical motion tenderness absent, multiparous os, UTERUS: enlarged to 19 week's size, ADNEXA: tenderness left, WET MOUNT done - results: few yeast, few WBCs, no trich, no clue cells.   Musculoskeletal: Normal range of motion. She exhibits tenderness. She exhibits no edema.       Right hip: She exhibits tenderness.       Legs: R medial, proximal thigh.  3"x3" localized area of varicosity noted.  No obvious deformity, no warmth, no swelling. Good distal pulses,  sensation intact bilaterally.   Neurological: She is alert and oriented to person, place, and time. She has normal reflexes.  Skin: Skin is warm.  Psychiatric: She has a normal mood and affect.   Results for orders placed during the hospital encounter of 10/21/13 (from the past 24 hour(s))  URINALYSIS, ROUTINE W REFLEX MICROSCOPIC     Status: Abnormal   Collection Time    10/21/13  1:20 PM      Result Value Range   Color, Urine YELLOW  YELLOW   APPearance CLEAR  CLEAR   Specific Gravity, Urine 1.015  1.005 - 1.030   pH 6.0  5.0 - 8.0   Glucose, UA NEGATIVE  NEGATIVE mg/dL   Hgb urine dipstick TRACE (*)  NEGATIVE   Bilirubin Urine NEGATIVE  NEGATIVE   Ketones, ur NEGATIVE  NEGATIVE mg/dL   Protein, ur NEGATIVE  NEGATIVE mg/dL   Urobilinogen, UA 0.2  0.0 - 1.0 mg/dL   Nitrite NEGATIVE  NEGATIVE   Leukocytes, UA NEGATIVE  NEGATIVE  URINE MICROSCOPIC-ADD ON     Status: Abnormal   Collection Time    10/21/13  1:20 PM      Result Value Range   Squamous Epithelial / LPF FEW (*) RARE   WBC, UA 0-2  <3 WBC/hpf   RBC / HPF 0-2  <3 RBC/hpf   Bacteria, UA RARE  RARE  WET PREP, GENITAL     Status: Abnormal   Collection Time    10/21/13  2:10 PM      Result Value Range   Yeast Wet Prep HPF POC FEW (*) NONE SEEN   Trich, Wet Prep NONE SEEN  NONE SEEN   Clue Cells Wet Prep HPF POC NONE SEEN  NONE SEEN   WBC, Wet Prep HPF POC FEW (*) NONE SEEN    MAU Course  Procedures None  MDM -UA: trace blood, no bacteria, no leukocytes, no nitrites  -FHTs per RN: 161 bpm -Spanish Interpreter paged -H&P: with interpreter present -Vaginal exam with speculum exam; spanish interpreter line used: GC/C, wet prep obtained  -Wet prep: yeast present with few WBC; no trich, no clue cells -Diflucan 150mg  PO x 1 dose given in MAU -D/C pt home  Assessment and Plan  A: -Vaginal yeast infection  -Round ligament pain  P: -Diflucan 150mg  PO x 1 dose given in MAU -Change positions frequently to avoid  lower abdominal pain -Wear a support belt to alleviate lower abdominal/ pelvic pressure  -Wear support hose to cover areas of varicosities -Return to MAU with any new or worsening symptoms -F/U at [facility] for scheduled prenatal appointment next week -RX: diflucan times 2 additional doses   Christiana Fuchs 10/21/2013, 3:14 PM   Evaluation and management procedures were performed by the under my supervision and collaboration. I have reviewed the note and chart, and I agree with the management and plan.  Iona Hansen Chaquana Nichols, NP 10/21/2013 4:53 PM

## 2013-10-21 NOTE — MAU Note (Signed)
Pt reports pain in left bottom quadrant of abdomen when she walks. Pt reports yellow discharge with an odor and itching.

## 2013-10-22 LAB — GC/CHLAMYDIA PROBE AMP: GC Probe RNA: NEGATIVE

## 2013-10-23 ENCOUNTER — Encounter: Payer: Self-pay | Admitting: *Deleted

## 2013-10-31 ENCOUNTER — Ambulatory Visit (INDEPENDENT_AMBULATORY_CARE_PROVIDER_SITE_OTHER): Payer: Self-pay | Admitting: Obstetrics and Gynecology

## 2013-10-31 ENCOUNTER — Encounter: Payer: Self-pay | Admitting: Obstetrics and Gynecology

## 2013-10-31 VITALS — BP 115/71 | Temp 97.7°F | Ht 62.0 in | Wt 131.2 lb

## 2013-10-31 DIAGNOSIS — O09292 Supervision of pregnancy with other poor reproductive or obstetric history, second trimester: Secondary | ICD-10-CM

## 2013-10-31 DIAGNOSIS — O09299 Supervision of pregnancy with other poor reproductive or obstetric history, unspecified trimester: Secondary | ICD-10-CM | POA: Insufficient documentation

## 2013-10-31 DIAGNOSIS — O09219 Supervision of pregnancy with history of pre-term labor, unspecified trimester: Secondary | ICD-10-CM

## 2013-10-31 DIAGNOSIS — O0992 Supervision of high risk pregnancy, unspecified, second trimester: Secondary | ICD-10-CM

## 2013-10-31 DIAGNOSIS — O09899 Supervision of other high risk pregnancies, unspecified trimester: Secondary | ICD-10-CM | POA: Insufficient documentation

## 2013-10-31 DIAGNOSIS — O0990 Supervision of high risk pregnancy, unspecified, unspecified trimester: Secondary | ICD-10-CM | POA: Insufficient documentation

## 2013-10-31 LAB — POCT URINALYSIS DIP (DEVICE)
Glucose, UA: NEGATIVE mg/dL
Protein, ur: NEGATIVE mg/dL
pH: 6 (ref 5.0–8.0)

## 2013-10-31 NOTE — Progress Notes (Signed)
Patient transferred care from [facility] secondary to history of preeclampsia in first pregnancy requiring delivery at 35 weeks. Patient reports uncomplicated prenatal care in subsequent pregnancy. Patient currently without complaints. Anatomy ultrasound report reviewed with patient.

## 2013-10-31 NOTE — Addendum Note (Signed)
Addended by: Franchot Mimes on: 10/31/2013 12:15 PM   Modules accepted: Orders

## 2013-10-31 NOTE — Progress Notes (Signed)
P=65m Here for new ob visit- transferred from [facility]. [employer]. C/o lower back pain last night and this am. Given new patient information.

## 2013-11-01 LAB — PRESCRIPTION MONITORING PROFILE (19 PANEL)
Amphetamine/Meth: NEGATIVE ng/mL
Barbiturate Screen, Urine: NEGATIVE ng/mL
Benzodiazepine Screen, Urine: NEGATIVE ng/mL
Buprenorphine, Urine: NEGATIVE ng/mL
Cannabinoid Scrn, Ur: NEGATIVE ng/mL
Carisoprodol, Urine: NEGATIVE ng/mL
Methadone Screen, Urine: NEGATIVE ng/mL
Methaqualone: NEGATIVE ng/mL
Nitrites, Initial: NEGATIVE ug/mL
Opiate Screen, Urine: NEGATIVE ng/mL
Propoxyphene: NEGATIVE ng/mL
Tapentadol, urine: NEGATIVE ng/mL

## 2013-11-05 ENCOUNTER — Emergency Department (HOSPITAL_COMMUNITY): Payer: Medicaid Other

## 2013-11-05 ENCOUNTER — Inpatient Hospital Stay (HOSPITAL_COMMUNITY)
Admission: EM | Admit: 2013-11-05 | Discharge: 2013-11-06 | Disposition: A | Discharge status: Other Acute Inpatient Hospital | Payer: Self-pay | Attending: Emergency Medicine | Admitting: Emergency Medicine

## 2013-11-05 ENCOUNTER — Encounter (HOSPITAL_COMMUNITY): Payer: Self-pay | Admitting: Emergency Medicine

## 2013-11-05 DIAGNOSIS — O9989 Other specified diseases and conditions complicating pregnancy, childbirth and the puerperium: Secondary | ICD-10-CM | POA: Insufficient documentation

## 2013-11-05 DIAGNOSIS — O4700 False labor before 37 completed weeks of gestation, unspecified trimester: Secondary | ICD-10-CM | POA: Insufficient documentation

## 2013-11-05 DIAGNOSIS — J45909 Unspecified asthma, uncomplicated: Secondary | ICD-10-CM | POA: Insufficient documentation

## 2013-11-05 DIAGNOSIS — B373 Candidiasis of vulva and vagina: Secondary | ICD-10-CM

## 2013-11-05 DIAGNOSIS — O2390 Unspecified genitourinary tract infection in pregnancy, unspecified trimester: Secondary | ICD-10-CM | POA: Insufficient documentation

## 2013-11-05 DIAGNOSIS — Z8679 Personal history of other diseases of the circulatory system: Secondary | ICD-10-CM | POA: Insufficient documentation

## 2013-11-05 DIAGNOSIS — O98819 Other maternal infectious and parasitic diseases complicating pregnancy, unspecified trimester: Secondary | ICD-10-CM | POA: Insufficient documentation

## 2013-11-05 DIAGNOSIS — R109 Unspecified abdominal pain: Secondary | ICD-10-CM | POA: Insufficient documentation

## 2013-11-05 DIAGNOSIS — N390 Urinary tract infection, site not specified: Secondary | ICD-10-CM | POA: Insufficient documentation

## 2013-11-05 DIAGNOSIS — B3731 Acute candidiasis of vulva and vagina: Secondary | ICD-10-CM | POA: Insufficient documentation

## 2013-11-05 DIAGNOSIS — O2342 Unspecified infection of urinary tract in pregnancy, second trimester: Secondary | ICD-10-CM

## 2013-11-05 LAB — WET PREP, GENITAL
Clue Cells Wet Prep HPF POC: NONE SEEN
Trich, Wet Prep: NONE SEEN
Yeast Wet Prep HPF POC: NONE SEEN

## 2013-11-05 LAB — URINALYSIS, ROUTINE W REFLEX MICROSCOPIC
Glucose, UA: NEGATIVE mg/dL
Hgb urine dipstick: NEGATIVE
Ketones, ur: NEGATIVE mg/dL
Nitrite: POSITIVE — AB
Protein, ur: NEGATIVE mg/dL
Specific Gravity, Urine: 1.018 (ref 1.005–1.030)

## 2013-11-05 MED ORDER — MORPHINE SULFATE 4 MG/ML IJ SOLN
4.0000 mg | Freq: Once | INTRAMUSCULAR | Status: AC
  Administered 2013-11-05: 4 mg via INTRAVENOUS

## 2013-11-05 MED ORDER — LACTATED RINGERS IV BOLUS (SEPSIS)
500.0000 mL | Freq: Once | INTRAVENOUS | Status: AC
  Administered 2013-11-05: 500 mL via INTRAVENOUS

## 2013-11-05 MED ORDER — MORPHINE SULFATE 4 MG/ML IJ SOLN
4.0000 mg | Freq: Once | INTRAMUSCULAR | Status: DC
  Filled 2013-11-05: qty 1

## 2013-11-05 MED ORDER — LACTATED RINGERS IV SOLN
INTRAVENOUS | Status: DC
  Administered 2013-11-05: 23:00:00 via INTRAVENOUS

## 2013-11-05 NOTE — ED Provider Notes (Signed)
CSN: [id number]     Arrival date & time 11/05/13  2108 History   First MD Initiated Contact with Patient 11/05/13 2109     Chief Complaint  Patient presents with  . Abdominal Pain  . Back Pain   (Consider location/radiation/quality/duration/timing/severity/associated sxs/prior Treatment) HPI Comments: Pt is a 32 y/o G3P2 Hispanic female [redacted] weeks pregnant who presents to the ED complaining of sudden onset abdominal pain ay 7:30 pm tonight. Initial pain described as a contraction followed by a constant severe pain describes as pressure on her vagina when the baby moves. No further contractions. Denies vaginal bleeding. States she has a large amount of thick yellow discharge. No known trauma. She is being followed by [facility] due to being high-risk pregnancy as she was pre-eclamptic and delivered at 35 weeks with her first child. Ultrasound on 10/15 showed anterior placenta with no previa.  Patient is a 32 y.o. female presenting with abdominal pain and back pain. The history is provided by the patient and the spouse. The history is limited by a language barrier. A language interpreter was used.  Abdominal Pain Associated symptoms: vaginal discharge   Back Pain Associated symptoms: abdominal pain     Past Medical History  Diagnosis Date  . Asthma   . Preterm labor     1st pregnancy d/t pre-e  . Pregnancy induced hypertension     previous pregnancy  . Pyelonephritis     June 2013  . Varicose veins    Past Surgical History  Procedure Laterality Date  . No past surgeries     Family History  Problem Relation Age of Onset  . Anesthesia problems Neg Hx   . Hypotension Neg Hx   . Malignant hyperthermia Neg Hx   . Pseudochol deficiency Neg Hx   . Other Neg Hx   . Hypertension Mother   . Asthma Father    History  Substance Use Topics  . Smoking status: Never Smoker   . Smokeless tobacco: Never Used  . Alcohol Use: No     Comment: a little when not pregnant   OB History    Grav Para Term Preterm Abortions TAB SAB Ect Mult Living   3 2 1 1  0 0 0 0 0 2     Review of Systems  Gastrointestinal: Positive for abdominal pain.  Genitourinary: Positive for vaginal discharge.  Musculoskeletal: Positive for back pain.  All other systems reviewed and are negative.    Allergies  Iohexol  Home Medications   Current Outpatient Rx  Name  Route  Sig  Dispense  Refill  . acetaminophen (TYLENOL) 325 MG tablet   Oral   Take 325 mg by mouth every 6 (six) hours as needed for moderate pain.          . Prenatal Vit-Fe Fumarate-FA (PRENATAL MULTIVITAMIN) TABS   Oral   Take 1 tablet by mouth daily at 12 noon.          BP 99/55  Pulse 68  Temp(Src) 98.1 F (36.7 C) (Oral)  Resp 18  SpO2 99%  LMP 04/30/2013 Physical Exam  Nursing note and vitals reviewed. Constitutional: She is oriented to person, place, and time. She appears well-developed and well-nourished. She appears distressed.  HENT:  Head: Normocephalic and atraumatic.  Mouth/Throat: Oropharynx is clear and moist.  Eyes: Conjunctivae are normal.  Neck: Normal range of motion. Neck supple.  Cardiovascular: Normal rate, regular rhythm and normal heart sounds.   Pulmonary/Chest: Effort normal and breath sounds  normal.  Abdominal: There is tenderness.  Gravid abdomen.  Genitourinary: Vaginal discharge (copious, thick, white) found.  Musculoskeletal: Normal range of motion. She exhibits no edema.  Neurological: She is alert and oriented to person, place, and time.  Skin: Skin is warm and dry. She is not diaphoretic.  Psychiatric: She has a normal mood and affect. Her behavior is normal.    ED Course  Procedures (including critical care time) Labs Review Labs Reviewed  WET PREP, GENITAL - Abnormal; Notable for the following:    WBC, Wet Prep HPF POC FEW (*)    All other components within normal limits   Imaging Review US Ob Limited  11/05/2013   CLINICAL DATA:  Pelvic pain. Twenty-two week  2 day assigned gestational age.  EXAM: LIMITED OBSTETRIC ULTRASOUND  FINDINGS: Number of Fetuses: 1  Heart Rate:  144 bpm  Movement: Yes  Presentation: Breech  Placental Location: Anterior and fundal  Previa: No  Amniotic Fluid (Subjective):  Within normal limits.  BPD:  5.4cm 22w  3d  MATERNAL FINDINGS:  Cervix:  Appears closed.  Uterus/Adnexae:  No abnormality visualized.  IMPRESSION: Single living IUP.  No acute maternal findings visualized.  This exam is performed on an emergent basis and does not comprehensively evaluate fetal size, dating, or anatomy; follow-up complete OB US should be considered if further fetal assessment is warranted.   Electronically Signed   By: Myles Rosenthal M.D.   On: 11/05/2013 22:40    EKG Interpretation   None       MDM   Pt presenting with abdominal pain in pregnancy, 19 weeks, sudden onset, began with contraction. She appears uncomfortable. No associated vaginal bleeding. Fetal heart tones present. Rapid response OB nurse present, was advised to check cervix, 1 cm dilated, 70% effaced. She will be transferred to [facility], accepted physician Dr. Shawnie Pons. Case discussed with attending Dr. Wilkie Aye who agrees with plan of care.     Trevor Mace, PA-C 11/05/13 2245

## 2013-11-05 NOTE — ED Notes (Signed)
OB Rapid response called 

## 2013-11-05 NOTE — ED Notes (Signed)
Pt reports sudden vagina pain that started at 7pm. Pt denies any n/v, bleeding but does have vaginal discharge that is yellow. This is pt 3rd pregnancy, previous 2 delivered at 38-39 weeks. Pt does get care at [facility].

## 2013-11-05 NOTE — Progress Notes (Signed)
Report given to [facility] in Maternity admissions by K.Marrie Chandra,RNC-RROB

## 2013-11-05 NOTE — Progress Notes (Signed)
This note also relates to the following rows which could not be included: Pulse Rate - Cannot attach notes to unvalidated device data   RROB spoke with Dr Shawnie Pons and told of pt's dilitation, u/s=breech, pt uncomfortable; orders for pt to be transferred to [facility]ma[facility].

## 2013-11-06 DIAGNOSIS — O2390 Unspecified genitourinary tract infection in pregnancy, unspecified trimester: Secondary | ICD-10-CM

## 2013-11-06 MED ORDER — MORPHINE SULFATE 2 MG/ML IJ SOLN: 1.0000 mg | Freq: Once | INTRAMUSCULAR | Status: DC

## 2013-11-06 MED ORDER — NITROFURANTOIN MONOHYD MACRO 100 MG PO CAPS: 100.0000 mg | ORAL_CAPSULE | Freq: Two times a day (BID) | ORAL | Status: DC

## 2013-11-06 MED ORDER — PHENAZOPYRIDINE HCL 200 MG PO TABS: 200.0000 mg | ORAL_TABLET | Freq: Three times a day (TID) | ORAL | Status: DC | PRN

## 2013-11-06 MED ORDER — FLUCONAZOLE 150 MG PO TABS
300.0000 mg | ORAL_TABLET | Freq: Once | ORAL | Status: AC
  Administered 2013-11-06: 300 mg via ORAL
  Filled 2013-11-06: qty 2

## 2013-11-06 MED ORDER — PHENAZOPYRIDINE HCL 100 MG PO TABS
200.0000 mg | ORAL_TABLET | Freq: Once | ORAL | Status: AC
  Administered 2013-11-06: 200 mg via ORAL
  Filled 2013-11-06: qty 2

## 2013-11-06 MED ORDER — SODIUM CHLORIDE 0.9 % IV SOLN
INTRAVENOUS | Status: DC
  Administered 2013-11-06: 01:00:00 via INTRAVENOUS

## 2013-11-06 MED ORDER — TERCONAZOLE 0.4 % VA CREA: 1.0000 | TOPICAL_CREAM | Freq: Every day | VAGINAL | Status: DC

## 2013-11-06 MED ORDER — DEXTROSE 5 % IV SOLN
1.0000 g | Freq: Once | INTRAVENOUS | Status: AC
  Administered 2013-11-06: 1 g via INTRAVENOUS
  Filled 2013-11-06: qty 10

## 2013-11-06 MED ORDER — MORPHINE SULFATE 4 MG/ML IJ SOLN
4.0000 mg | Freq: Once | INTRAMUSCULAR | Status: AC
  Administered 2013-11-06: 4 mg via INTRAVENOUS
  Filled 2013-11-06: qty 1

## 2013-11-06 NOTE — ED Provider Notes (Signed)
Medical screening examination/treatment/procedure(s) were conducted as a shared visit with non-physician practitioner(s) and myself.  I personally evaluated the patient during the encounter.  EKG Interpretation    Date/Time:    Ventricular Rate:    PR Interval:    QRS Duration:   QT Interval:    QTC Calculation:   R Axis:     Text Interpretation:             Patient presents with sudden onset of abdominal pain.  Approx [redacted] wks pregnant.  Hx sign for eclampsia.  No vaginal bleeding reported. VSS.  FHT presents.  No contractions noted on tocometry.  RR OB nurse cervical check 1 cm dilated and 70% effaced.  Will be transferred to [facility] for further management, accepting Dr. Shawnie Pons.  Shon Baton, MD 11/06/13 1026

## 2013-11-06 NOTE — MAU Provider Note (Signed)
Chief Complaint:  Abdominal Pain and Back Pain   First Provider Initiated Contact with Patient 11/06/13 0108      HPI: Cathy Nash is a 32 y.o. [id number] at [redacted]w[redacted]d who was transported to maternity admissions by [facility] for contractions and cervical dilation of 1/70 per Rapid Response OB RN at [facility]. UA shows UTI. Pt reports urgency and ? bladder spasm. Denies contractions since arrival to MAU. 2 doses of Morphine at [facility]. Last dose at 0030. Denies leakage of fluid or vaginal bleeding. Good fetal movement.   Limited US at [facility] showed closed cervix. CL 3 cm (not in report, but on images. Verified by Radiologist verbally.)  Recent transfer to [facility] for Hx Pre-E.  Patient Active Problem List   Diagnosis Date Noted  . Supervision of high-risk pregnancy 10/31/2013  . History of preterm delivery, currently pregnant 10/31/2013  . Hx of preeclampsia, prior pregnancy, currently pregnant 10/31/2013    Past Medical History: Past Medical History  Diagnosis Date  . Asthma   . Preterm labor     1st pregnancy d/t pre-e  . Pregnancy induced hypertension     previous pregnancy  . Pyelonephritis     June 2013  . Varicose veins     Past obstetric history: OB History  Gravida Para Term Preterm AB SAB TAB Ectopic Multiple Living  3 2 1 1  0 0 0 0 0 2    # Outcome Date GA Lbr Len/2nd Weight Sex Delivery Anes PTL Lv  3 CUR           2 TRM 08/17/12 [redacted]w[redacted]d 05:51 / 00:06 2.495 kg (5 lb 8 oz) M SVD None  Y  1 PRE 07/04/06 [redacted]w[redacted]d   M SVD  N Y      Past Surgical History: Past Surgical History  Procedure Laterality Date  . No past surgeries       Family History: Family History  Problem Relation Age of Onset  . Anesthesia problems Neg Hx   . Hypotension Neg Hx   . Malignant hyperthermia Neg Hx   . Pseudochol deficiency Neg Hx   . Other Neg Hx   . Hypertension Mother   . Asthma Father     Social History: History  Substance Use Topics  . Smoking status: Never Smoker   . Smokeless  tobacco: Never Used  . Alcohol Use: No     Comment: a little when not pregnant    Allergies:  Allergies  Allergen Reactions  . Iohexol      Code: SOB, Desc: IMMEDIATELY SOB FOLLOWING IV INJECTION, SPOTTY HIVES, PT GIVEN BENADRYL AND EPI-ARS 08/22/09, Onset Date: [id number]     Meds:  Prescriptions prior to admission  Medication Sig Dispense Refill  . Prenatal Vit-Fe Fumarate-FA (PRENATAL MULTIVITAMIN) TABS Take 1 tablet by mouth daily at 12 noon.      Marland Kitchen acetaminophen (TYLENOL) 325 MG tablet Take 325 mg by mouth every 6 (six) hours as needed for moderate pain.         ROS: Pertinent findings in history of present illness.  Physical Exam  Blood pressure 98/62, pulse 61, temperature 98.4 F (36.9 C), temperature source Oral, resp. rate 20, last menstrual period 04/30/2013, SpO2 99.00%. GENERAL: Well-developed, well-nourished female in no acute distress.  HEENT: normocephalic HEART: normal rate RESP: normal effort ABDOMEN: Soft, non-tender, gravid appropriate for gestational age EXTREMITIES: Nontender, no edema NEURO: alert and oriented SPECULUM EXAM: Deferred  Dilation: 1 Effacement (%): 60 Cervical Position: Middle Station: Ballotable Presentation:  (  breech seen on u/s) Exam by:: Jerimah Witucki, CNM  FHT:  Baseline 130 , min-moderate variability, accelerations present, no decelerations Contractions: none   Labs: Results for orders placed during the hospital encounter of 11/05/13 (from the past 24 hour(s))  WET PREP, GENITAL     Status: Abnormal   Collection Time    11/05/13  9:43 PM      Result Value Range   Yeast Wet Prep HPF POC NONE SEEN  NONE SEEN   Trich, Wet Prep NONE SEEN  NONE SEEN   Clue Cells Wet Prep HPF POC NONE SEEN  NONE SEEN   WBC, Wet Prep HPF POC FEW (*) NONE SEEN  URINALYSIS, ROUTINE W REFLEX MICROSCOPIC     Status: Abnormal   Collection Time    11/05/13 10:30 PM      Result Value Range   Color, Urine YELLOW  YELLOW   APPearance CLEAR  CLEAR    Specific Gravity, Urine 1.018  1.005 - 1.030   pH 6.5  5.0 - 8.0   Glucose, UA NEGATIVE  NEGATIVE mg/dL   Hgb urine dipstick NEGATIVE  NEGATIVE   Bilirubin Urine NEGATIVE  NEGATIVE   Ketones, ur NEGATIVE  NEGATIVE mg/dL   Protein, ur NEGATIVE  NEGATIVE mg/dL   Urobilinogen, UA 1.0  0.0 - 1.0 mg/dL   Nitrite POSITIVE (*) NEGATIVE   Leukocytes, UA SMALL (*) NEGATIVE  URINE MICROSCOPIC-ADD ON     Status: Abnormal   Collection Time    11/05/13 10:30 PM      Result Value Range   Squamous Epithelial / LPF FEW (*) RARE   WBC, UA 3-6  <3 WBC/hpf   Bacteria, UA MANY (*) RARE    Imaging:  US Ob Limited  11/05/2013   CLINICAL DATA:  Pelvic pain. Twenty-two week 2 day assigned gestational age.  EXAM: LIMITED OBSTETRIC ULTRASOUND  FINDINGS: Number of Fetuses: 1  Heart Rate:  144 bpm  Movement: Yes  Presentation: Breech  Placental Location: Anterior and fundal  Previa: No  Amniotic Fluid (Subjective):  Within normal limits.  BPD:  5.4cm 22w  3d  MATERNAL FINDINGS:  Cervix:  Appears closed.  Uterus/Adnexae:  No abnormality visualized.  IMPRESSION: Single living IUP.  No acute maternal findings visualized.  This exam is performed on an emergent basis and does not comprehensively evaluate fetal size, dating, or anatomy; follow-up complete OB US should be considered if further fetal assessment is warranted.   Electronically Signed   By: Myles Rosenthal M.D.   On: 11/05/2013 22:40   MAU Course: Rocephin, IV fluids, Pyridium, Diflucan given.  No further pain while in MAU. No further cervical change.   Assessment: 1. UTI in pregnancy, antepartum, second trimester   2. Preterm labor, fetus 1   3. Vulvovaginal candidiasis    Plan: Discharge home in stab;e condition per consult w/ Dr. Shawnie Pons.  Preterm labor precautions and fetal kick counts. Pelvic rest     Follow-up Information   Follow up with [facility] In 1 week. (will call you to scheduled appointment or as needed if symptoms worsen)     Specialty:  Obstetrics and Gynecology   Contact information:   [address] [place] [address] [phone]      Follow up with [facility]. (As needed if symptoms worsen)    Contact information:   [address] [id number] [place] [address] [phone]       Medication List  acetaminophen 325 MG tablet  Commonly known as:  TYLENOL  Take 325 mg by mouth every 6 (six) hours as needed for moderate pain.     nitrofurantoin (macrocrystal-monohydrate) 100 MG capsule  Commonly known as:  MACROBID  Take 1 capsule (100 mg total) by mouth 2 (two) times daily.     phenazopyridine 200 MG tablet  Commonly known as:  PYRIDIUM  Take 1 tablet (200 mg total) by mouth 3 (three) times daily as needed for pain.     prenatal multivitamin Tabs tablet  Take 1 tablet by mouth daily at 12 noon.     terconazole 0.4 % vaginal cream  Commonly known as:  TERAZOL 7  Place 1 applicator vaginally at bedtime.        Florin, CNM 11/06/2013 3:29 AM

## 2013-11-06 NOTE — MAU Provider Note (Signed)
Chart reviewed and agree with management and plan.  

## 2013-11-06 NOTE — MAU Note (Addendum)
PT  HAS ARRIVED VIA [facility]- SHE WENT TO [facility] TONIGHT AT 8PM-  SHE LIVES CLOSE BY-    SAYS AT SHE WAS SITTING ON SOFA AND FELT ABD   PAIN-  SO WENT TO [facility].      WHILE THERE  SHE HAS CONTINUED TO FEEL SAME PAIN.    HERE IN MAU- NO PAIN-  SAID  THEY GAVE HER MED.      PT WAS IN MAU 2 WEEKS  AGO - DX  WITH VAG INFECTION- SAYS  SHE TOOK ALL MED.Marland Kitchen    SAYS LAST SEX-  LAST TUESDAY

## 2013-11-06 NOTE — Progress Notes (Signed)
RROB spoke with Dr Shawnie Pons for orders for pain meds; order to recheck cervix and if wnl then may repeat same med given before

## 2013-11-07 LAB — URINE CULTURE: Colony Count: NO GROWTH

## 2013-11-19 ENCOUNTER — Inpatient Hospital Stay (HOSPITAL_COMMUNITY)
Admission: AD | Admit: 2013-11-19 | Discharge: 2013-11-20 | Disposition: A | Discharge status: Home / Self Care | Payer: Medicaid Other | Source: Ambulatory Visit | Attending: Family Medicine | Admitting: Family Medicine

## 2013-11-19 ENCOUNTER — Encounter (HOSPITAL_COMMUNITY): Payer: Self-pay

## 2013-11-19 DIAGNOSIS — R109 Unspecified abdominal pain: Secondary | ICD-10-CM | POA: Insufficient documentation

## 2013-11-19 DIAGNOSIS — O0992 Supervision of high risk pregnancy, unspecified, second trimester: Secondary | ICD-10-CM

## 2013-11-19 DIAGNOSIS — O09292 Supervision of pregnancy with other poor reproductive or obstetric history, second trimester: Secondary | ICD-10-CM

## 2013-11-19 DIAGNOSIS — N390 Urinary tract infection, site not specified: Secondary | ICD-10-CM | POA: Insufficient documentation

## 2013-11-19 DIAGNOSIS — R51 Headache: Secondary | ICD-10-CM | POA: Insufficient documentation

## 2013-11-19 DIAGNOSIS — O4700 False labor before 37 completed weeks of gestation, unspecified trimester: Secondary | ICD-10-CM | POA: Insufficient documentation

## 2013-11-19 DIAGNOSIS — O2390 Unspecified genitourinary tract infection in pregnancy, unspecified trimester: Secondary | ICD-10-CM | POA: Insufficient documentation

## 2013-11-19 LAB — URINALYSIS, ROUTINE W REFLEX MICROSCOPIC
Bilirubin Urine: NEGATIVE
Protein, ur: NEGATIVE mg/dL
Urobilinogen, UA: 1 mg/dL (ref 0.0–1.0)

## 2013-11-19 LAB — URINE MICROSCOPIC-ADD ON

## 2013-11-19 MED ORDER — BUTALBITAL-APAP-CAFFEINE 50-325-40 MG PO TABS
1.0000 | ORAL_TABLET | Freq: Once | ORAL | Status: AC
  Administered 2013-11-19: 1 via ORAL
  Filled 2013-11-19: qty 1

## 2013-11-19 MED ORDER — CEPHALEXIN 500 MG PO CAPS: 500.0000 mg | ORAL_CAPSULE | Freq: Two times a day (BID) | ORAL | Status: DC

## 2013-11-19 MED ORDER — CEFTRIAXONE SODIUM 1 G IJ SOLR
1.0000 g | Freq: Once | INTRAMUSCULAR | Status: AC
  Administered 2013-11-19: 1 g via INTRAMUSCULAR
  Filled 2013-11-19: qty 10

## 2013-11-19 NOTE — MAU Note (Signed)
To clarify, pt said she was seen here 2 weeks ago for UTI symptoms not 2 days ago.

## 2013-11-19 NOTE — MAU Note (Signed)
To clarify, pt reports headache started today, UTI symptoms started 2 days ago.

## 2013-11-19 NOTE — MAU Provider Note (Signed)
History    CSN: [id number] Arrival date and time: 11/19/13 2058  Chief Complaint  Patient presents with  . Abdominal Pain  . Respiratory Distress   HPI Cathy Nash is a 32 y.o. [id number] at [redacted]w[redacted]d by 6w U/S presenting to maternity admissions complaining of lower abdominal pain and headache.   The pain is constant, located in lower abdomen L > R and radiates into groin and sacral back. She reports increased urinary frequency, nocturia, and the feeling of incomplete bladder emptying. She denies fever, dysuria, VB, LOF, contractions. + normal lochia, GFM.   Reports frontal HA since 4pm 7/10 not helped with tylenol, nonradiating with photophobia, without nausea/vomiting, vision disturbances, RUQ/epigastric pain, swelling.   She was treated for UTI on basis of symptoms and positive U/A on 11/25 with CTX and reports taking macrobid about once per day because she associated breast pain with taking it. She has a pill bottle with 7 of 14 pills remaining. She also completed a 7 day course of topical treatment of vulvovaginal candidiasis prescribed at that time.  Breech presentation noted on 11/25 U/S. She is seen in [facility] after being transferred from HD for history of pre-eclampsia with 1st pregnancy, and has had no abnormal blood pressures and a normal 3hr GTT this pregnancy.   Pt declined interpreter.   OB History:  G1: IOL at 35wks for pre-eclampsia G2: NSVD of 5lb 8oz M at 37w  OB History   Grav Para Term Preterm Abortions TAB SAB Ect Mult Living   3 2 1 1  0 0 0 0 0 2      Past Medical History  Diagnosis Date  . Asthma   . Preterm labor     1st pregnancy d/t pre-e  . Pregnancy induced hypertension     previous pregnancy  . Pyelonephritis     June 2013  . Varicose veins     Past Surgical History  Procedure Laterality Date  . No past surgeries      Family History  Problem Relation Age of Onset  . Anesthesia problems Neg Hx   . Hypotension Neg Hx   . Malignant  hyperthermia Neg Hx   . Pseudochol deficiency Neg Hx   . Other Neg Hx   . Hypertension Mother   . Asthma Father     History  Substance Use Topics  . Smoking status: Never Smoker   . Smokeless tobacco: Never Used  . Alcohol Use: No     Comment: a little when not pregnant   Allergies:  Allergies  Allergen Reactions  . Iohexol      Code: SOB, Desc: IMMEDIATELY SOB FOLLOWING IV INJECTION, SPOTTY HIVES, PT GIVEN BENADRYL AND EPI-ARS 08/22/09, Onset Date: [id number]     Prescriptions prior to admission  Medication Sig Dispense Refill  . acetaminophen (TYLENOL) 325 MG tablet Take 325 mg by mouth every 6 (six) hours as needed for moderate pain.       . nitrofurantoin, macrocrystal-monohydrate, (MACROBID) 100 MG capsule Take 1 capsule (100 mg total) by mouth 2 (two) times daily.  14 capsule  0  . phenazopyridine (PYRIDIUM) 200 MG tablet Take 1 tablet (200 mg total) by mouth 3 (three) times daily as needed for pain.  12 tablet  0  . Prenatal Vit-Fe Fumarate-FA (PRENATAL MULTIVITAMIN) TABS Take 1 tablet by mouth daily at 12 noon.      Marland Kitchen terconazole (TERAZOL 7) 0.4 % vaginal cream Place 1 applicator vaginally at bedtime.  45 g  0  ROS As above, otherwise negative. Physical Exam   Blood pressure 110/64, pulse 82, temperature 98.1 F (36.7 C), temperature source Oral, resp. rate 18, height 5\' 2"  (1.575 m), weight 62.143 kg (137 lb), last menstrual period 04/30/2013, SpO2 100.00%.  Physical Exam  Constitutional: She is oriented to person, place, and time. She appears well-developed and well-nourished. No distress.  HENT:  Head: Normocephalic and atraumatic.  Eyes: Conjunctivae and EOM are normal. No scleral icterus.  Neck: Normal range of motion. Neck supple.  Cardiovascular: Normal heart sounds and intact distal pulses.  Exam reveals no gallop.   No murmur heard. Respiratory: Effort normal and breath sounds normal. No respiratory distress. She has no rales.  GI: Soft. Bowel sounds are  normal. There is no tenderness.  Genitourinary:  No CVA tenderness  Musculoskeletal: Normal range of motion. She exhibits no edema.  Neurological: She is alert and oriented to person, place, and time. She has normal reflexes.  Skin:  6 round areas of well demarcated bruising approx 3cm in diameter across upper/mid back    SCE: 1cm/thick/ballotable   FHT:  Baseline 130, moderate variability, 15 x 15 accelerations present, no decelerations Toco:  No contractions   11/19/2013 21:24  Color, Urine ORANGE (A)  APPearance CLEAR  Specific Gravity, Urine 1.010  pH 6.0  Glucose NEGATIVE  Bilirubin Urine NEGATIVE  Ketones, ur NEGATIVE  Protein NEGATIVE  Urobilinogen, UA 1.0  Nitrite POSITIVE (A)  Leukocytes, UA NEGATIVE  Hgb urine dipstick TRACE (A)  WBC, UA 0-2  RBC / HPF 0-2  Squamous Epithelial / LPF RARE  Bacteria, UA RARE   MAU Course  Procedures  Abdominal pain with symptoms of UTI. Cervical exam unchanged from reported 2 weeks ago and no contractions on toco. U/A consistent with UTI due to incomplete medical adherence to macrobid. No signs/symptoms of pyelonephritis. Will retreat. HA without other S/Sxs of pre-eclampsia, likely due to poor sleeping and pain. BP wnl. Will not get PIH labs. FHTs reactive.   Assessment and Plan  Cathy Nash is a 32 y.o. [id number] with [facility]UP at [redacted]w[redacted]d presenting with incompletely treated UTI.   #UTI:  - CTX 1g IM x1 - Rx keflex 500mg  BID x7 days #Headache - Fioricet x1 here #Abdominal pain - Preterm labor precautions reviewed #FWB - Kick counts reviewed  - Follow up at [facility] 11/21/2013 at 8:45am  Cathy Nash 11/19/2013, 10:11 PM

## 2013-11-19 NOTE — MAU Note (Signed)
Pt reports lots of abd pain and trouble breathing.pain started today.

## 2013-11-19 NOTE — MAU Note (Signed)
Lower abd pain and lower back and bad headache, started 2 days ago.  States med she was prescribed from here is not helping.  Denies any bleeding.

## 2013-11-20 NOTE — MAU Provider Note (Signed)
I have seen the patient with the resident/student and agree with the above.  Hogan, Heather Donovan  

## 2013-11-21 ENCOUNTER — Encounter: Payer: Self-pay | Admitting: Obstetrics & Gynecology

## 2013-11-21 ENCOUNTER — Ambulatory Visit (INDEPENDENT_AMBULATORY_CARE_PROVIDER_SITE_OTHER): Payer: Self-pay | Admitting: Obstetrics & Gynecology

## 2013-11-21 VITALS — BP 113/68 | Wt 134.6 lb

## 2013-11-21 DIAGNOSIS — O09219 Supervision of pregnancy with history of pre-term labor, unspecified trimester: Secondary | ICD-10-CM

## 2013-11-21 LAB — POCT URINALYSIS DIP (DEVICE)
Bilirubin Urine: NEGATIVE
Glucose, UA: NEGATIVE mg/dL
Ketones, ur: NEGATIVE mg/dL
Nitrite: NEGATIVE
pH: 6 (ref 5.0–8.0)

## 2013-11-21 NOTE — Patient Instructions (Signed)
Segundo trimestre del embarazo  (Second Trimester of Pregnancy) El segundo trimestre del embarazo se extiende desde la semana 13 hasta la semana 28, del 4 al 6 mes. En general, es el momento del embarazo en el que se sentir mejor. Su organismo se ha adaptado a estar embarazada y comienza a sentirse fsicamente mejor. En general las nuseas matutinas han disminuido o han desaparecido completamente. El segundo trimestre es tambin la poca en la que el feto se desarrolla rpidamente. Hacia el final del sexto mes, el beb mide aproximadamente 9 pulgadas (23 cm) y pesa alrededor de 1  libras (700 g). Es probable que sienta mover al beb (dar pataditas) entre las 18 y 20 semanas del embarazo.  CAMBIOS CORPORALES  Su organismo atravesar numerosos cambios durante el embarazo. Los cambios varan de una mujer a otra.   Seguir aumentando de peso. Notar que la parte baja del abdomen se ensancha.  Podrn aparecer las primeras estras en las caderas, abdomen y mamas.  Es posible que sienta cefaleas, que se pueden aliviar con los medicamentos que su mdico le autorice a utilizar.  Tendr necesidad de orinar con ms frecuencia porque el feto est presionando en la vejiga.  Como consecuencia del embarazo, podr sentir acidez estomacal continuamente.  Podr estar constipada ya que ciertas hormonas hacen que los msculos que empujan los desechos a travs de los intestinos trabajen ms lentamente.  Pueden aparecer hemorroides o abultarse las venas (venas varicosas).  El dolor de espalda se debe al aumento de peso y a que las hormonas del embarazo relajan las articulaciones entre los huesos de la pelvis y a la modificacin del peso y a los msculos que soportan el equilibrio.  Sus mamas seguirn desarrollndose y estarn ms sensibles.  Las encas pueden sangrar y estar sensibles al cepillado y al hilo dental.  Pueden aparecer en el rostro zonas oscuras o manchas (cloasma, mscara del embarazo). Esto  desaparece despus del nacimiento del beb.  Es posible que se formeuna lnea oscura desde el ombligo a la zona del pubis (linea nigra). Esto desaparece despus del nacimiento del beb. QU DEBE ESPERAR EN LAS CONSULTAS PRENATALES  Durante una visita prenatal de rutina:   La pesarn para verificar que usted y el feto se encuentran dentro de los lmites normales.  Le tomarn la presin arterial.  Le medirn el abdomen para verificar el desarrollo del beb.  Escucharn los latidos fetales.  Se evaluarn los resultados de los estudios realizados en visitas anteriores. El mdico le preguntar:   Cmo se siente.  Si siente los movimientos del beb.  Si tiene sntomas anormales, como prdida de lquido, sangrado, dolores de cabeza intensos o clicos abdominales.  Si tiene alguna duda. Otros estudios que podrn realizarse durante el segundo trimestre son:   Anlisis de sangre para evaluar:  Niveles bajos de hierro (anemia).  Diabetes gestacional (entre las 24 y las 28 semanas).  Anticuerpos Rh.  Anlisis de orina para detectar infecciones, diabetes o protenas en la orina.  Una ecografa para confirmar si el crecimiento y el desarrollo del beb son los adecuados.  Una amniocentesis para diagnosticar posibles problemas genticos.  Estudios del feto para descartar espina bfida y sndrome de Down. INSTRUCCIONES PARA EL CUIDADO EN EL HOGAR   Evite fumar, consumir hierbas, beber alcohol y utilizar frmacos que no ne hayan recetado. Estas sustancias qumicas afectan la formacin y el desarrollo del beb.  Siga las indicaciones del profesional con respecto a como tomar los medicamentos. Durante el embarazo, hay   medicamentos que son seguros y otros no lo son.  Realice actividad fsica slo segn las indicaciones del mdico. Sentir clicos uterinos es el mejor signo para detener la actividad fsica.  Contine haciendo comidas regulares y sanas.  Use un sostn que le brinde buen  soporte si sus mamas estn sensibles.  No utilice la baera con agua caliente, baos turcos y saunas.  Colquese el cinturn de seguridad cuando conduzca.  Evite comer carne cruda y el contacto con los utensilios y desperdicios de los gatos. Estos elementos contienen grmenes que pueden causar defectos de nacimiento en el beb.  Tome las vitaminas indicadas para la etapa prenatal.  Pruebe un laxante (si el mdico la autoriza) si tiene constipacin. Consuma ms alimentos ricos en fibra, como vegetales y frutas frescos y cereales enteros. Beba gran cantidad de lquido para mantener la orina de tono claro o amarillo plido.  Tome baos de agua tibia para calmar el dolor o las molestias causadas por las hemorroides. Use una crema para las hemorroides si el mdico la autoriza.  Si tiene venas varicosas, use medias de soporte. Eleve los pies durante 15 minutos, 3 o 4 veces por da. Limite el consumo de sal en su dieta.  Evite levantar objetos pesados, use zapatos de tacones bajos y mantenga una buena postura.  Descanse con las piernas elevadas si tiene calambres o dolor de cintura.  Visite a su dentista si no lo ha hecho durante el embarazo. Use un cepillo de dientes blando para higienizarse los dientes y use suavemente el hilo dental.  Puede continuar su vida sexual siempre que el mdico la autorice.  Concurra a todas las visitas prenatales segn las indicaciones de su mdico. SOLICITE ATENCIN MDICA SI:   Tiene mareos.  Siente clicos leves, presin en la pelvis o dolor persistente en el abdomen.  Tiene nuseas o vmitos o diarrea persistentes.  Observa una secrecin vaginal con mal olor.  Siente dolor al orinar. SOLICITE ATENCIN MDICA DE INMEDIATO SI:   Tiene fiebre.  Pierde lquido por la vagina.  Tiene sangrando o pequeas prdidas vaginales.  Siente dolor intenso o clicos en el abdomen.  Sube o baja de peso rpidamente.  Tiene dificultad para respirar y le duele  pecho.  Sbitamente se le hincha el rostro, las manos, los tobillos, los pies o las piernas de manera extrema.  No ha sentido los movimientos del beb durante una hora.  Siente un dolor de cabeza intenso que no se alivia con medicamentos.  Su visin se modifica. Document Released: 09/07/2005 Document Revised: 07/31/2013 [employer] Patient Information 2014 [employer].  

## 2013-11-21 NOTE — Progress Notes (Signed)
Treated for UTI with Keflex.

## 2013-11-21 NOTE — Progress Notes (Signed)
Pulse: 84 Patient reports dyspnea since last night.

## 2013-11-28 ENCOUNTER — Encounter: Payer: Self-pay | Admitting: Family Medicine

## 2013-12-12 NOTE — L&D Delivery Note (Signed)
Delivery Note At 9:35 AM a viable female was delivered via Vaginal, Spontaneous Delivery (Presentation:ROA  ).  APGAR: 8,9, crying baby placed on mother's chest at time of delivery. Weights pending.   Placenta status: spontaneous, intact.  Cord: 3 vessels with the following complications: None.  Cord pH: not collected  Anesthesia: Other  Episiotomy: N/A  Lacerations: N/A Suture Repair: N/A Est. Blood Loss (mL): 300  Mom to postpartum.  Baby to Couplet care / Skin to Skin.  Cathy Nash was presented with SOL and proceeded to advance to active labor. Due to infrequent contractions, pitocin was given to augment delivery. She labored for approximately 45 minutes after pitocin initation before SVD of viable baby boy. No complications, Dr. Reola CalkinsBeck was present to supervise. No lacerations. Counts correct x2. Routine post-partum care.    Cathy Nash, Cathy Nash 02/21/2014, 9:48 AM   I was present for and supervised the delivery of this newborn. No complications or tears.  I agree with above documentation.   Cathy Nash, Cathy BasemanKELI L, MD

## 2013-12-19 ENCOUNTER — Ambulatory Visit (INDEPENDENT_AMBULATORY_CARE_PROVIDER_SITE_OTHER): Payer: Self-pay | Admitting: Obstetrics & Gynecology

## 2013-12-19 VITALS — BP 105/67 | Temp 98.2°F | Wt 139.4 lb

## 2013-12-19 DIAGNOSIS — O09219 Supervision of pregnancy with history of pre-term labor, unspecified trimester: Secondary | ICD-10-CM

## 2013-12-19 DIAGNOSIS — O09213 Supervision of pregnancy with history of pre-term labor, third trimester: Principal | ICD-10-CM

## 2013-12-19 DIAGNOSIS — O0992 Supervision of high risk pregnancy, unspecified, second trimester: Secondary | ICD-10-CM

## 2013-12-19 DIAGNOSIS — O09893 Supervision of other high risk pregnancies, third trimester: Secondary | ICD-10-CM

## 2013-12-19 DIAGNOSIS — Z23 Encounter for immunization: Secondary | ICD-10-CM

## 2013-12-19 LAB — CBC
HEMATOCRIT: 32.2 % — AB (ref 36.0–46.0)
Hemoglobin: 11 g/dL — ABNORMAL LOW (ref 12.0–15.0)
MCH: 31.6 pg (ref 26.0–34.0)
MCHC: 34.2 g/dL (ref 30.0–36.0)
MCV: 92.5 fL (ref 78.0–100.0)
Platelets: 336 10*3/uL (ref 150–400)
RBC: 3.48 MIL/uL — ABNORMAL LOW (ref 3.87–5.11)
RDW: 13.6 % (ref 11.5–15.5)
WBC: 6.7 10*3/uL (ref 4.0–10.5)

## 2013-12-19 LAB — POCT URINALYSIS DIP (DEVICE)
Bilirubin Urine: NEGATIVE
Glucose, UA: 100 mg/dL — AB
KETONES UR: NEGATIVE mg/dL
LEUKOCYTES UA: NEGATIVE
Nitrite: NEGATIVE
PH: 6 (ref 5.0–8.0)
PROTEIN: 30 mg/dL — AB
SPECIFIC GRAVITY, URINE: 1.025 (ref 1.005–1.030)
Urobilinogen, UA: 0.2 mg/dL (ref 0.0–1.0)

## 2013-12-19 MED ORDER — TETANUS-DIPHTH-ACELL PERTUSSIS 5-2.5-18.5 LF-MCG/0.5 IM SUSP
0.5000 mL | Freq: Once | INTRAMUSCULAR | Status: AC
  Administered 2013-12-19: 0.5 mL via INTRAMUSCULAR

## 2013-12-19 NOTE — Patient Instructions (Signed)
Regrese a la clinica cuando tenga su cita. Si tiene problemas o preguntas, llama a la clinica o vaya a la sala de emergencia al [facility]. Lactancia materna  (Breastfeeding)  El cambio hormonal durante el Psychiatristembarazo produce el desarrollo del tejido Memphismamario y un aumento en el nmero y tamao de los conductos galactforos. La hormona prolactina permite que las protenas, los azcares y las grasas de la sangre produzcan la WPS Resourcesleche materna en las glndulas productoras de Plentywoodleche. La hormona progesterona impide que la leche materna sea liberada antes del nacimiento del beb. Despus del nacimiento del beb, su nivel de progesterona disminuye permitiendo que la leche materna sea Remingtonliberada. Pensar en el beb, as como la succin o Theatre managerel llanto, pueden estimular la liberacin de Playa Fortunaleche de las glndulas productoras de Shaferleche.  La decisin de Company secretaryamamantar (lactar) es una de las mejores opciones que usted puede hacer para usted y su beb. La informacin que sigue da una breve resea de los beneficios, as Lexicographercomo otras caractersticas importantes que debe saber sobre la Casper Mountainlactancia materna.  LOS BENEFICIOS DE AMAMANTAR  Para el beb   La primera leche (calostro) ayuda al mejor funcionamiento del sistema digestivo del beb.   La leche tiene anticuerpos que provienen de la madre y que ayudan a prevenir las infecciones en el beb.   El beb tiene una menor incidencia de asma, alergias y del sndrome de muerte sbita del lactante (SMSL).   Los nutrientes de la Goodlettsvilleleche materna son mejores para el beb que la Monmouth Beachleche maternizada.  La leche materna mejora el desarrollo cerebral del beb.   Su beb tendr menos gases, clicos y estreimiento.  Es menos probable que el beb desarrolle otras enfermedades, como obesidad infantil, asma o diabetes mellitus. Para usted   La lactancia materna favorece el desarrollo de un vnculo muy especial entre la madre y el beb.   Es ms conveniente, siempre disponible, a la Ugandatemperatura  adecuada y Woody Creekeconmica.   La lactancia materna ayuda a quemar caloras y a perder el peso ganado durante el Piney Point Villageembarazo.   Hace que el tero se contraiga ms rpidamente a su tamao normal y Consolidated Edisondisminuye el sangrado despus del Bedfordparto.   Las M.D.C. Holdingsmadres que amamantan tienen menos riesgo de Environmental education officerdesarrollar osteoporosis o cncer de mama o de ovario en el futuro.  FRECUENCIA DEL AMAMANTAMIENTO   Un beb sano, nacido a trmino, puede amamantarse con tanta frecuencia como cada hora, o espaciar las comidas cada tres horas. La frecuencia en la lactancia varan de un beb a otro.   Los recin nacidos deben ser alimentados por lo menos cada 2-3 horas Administratordurante el da y cada 4-5 horas durante la noche. Usted debe amamantarlo un mnimo de 8 tomas en un perodo de 24 horas.  Despierte al beb para amamantarlo si han pasado 3-4 horas desde la ltima comida.  Amamante cuando sienta la necesidad de reducir la plenitud de sus senos o cuando el beb muestre signos de Bisbeehambre. Las seales de que el beb puede Gentry Fitztener hambre son:  Lenora Boysumenta su estado de alerta o vigilancia.  Se estira.  Mueve la cabeza de un lado a otro.  Mueve la cabeza y abre la boca cuando se le toca la mejilla o la boca (reflejo de succin).  Aumenta las vocalizaciones, tales como sonidos de succin, relamerse los labios, arrullos, suspiros, o chirridos.  Mueve la Jones Apparel Groupmano hacia la boca.  Se chupa con ganas los dedos o las manos.  Agitacin.  Llanto intermitente.  Los signos de  hambre extrema requerirn que lo calme y lo consuele antes de tratar de alimentarlo. Los signos de hambre extrema son:  Agitacin.  Llanto fuerte e intenso.  Gritos.  El amamantamiento frecuente la ayudar a producir ms [place] y a Education officer, community de Engineer, mining en los pezones e hinchazn de las [place].  LACTANCIA MATERNA   Ya sea que se encuentre acostada o sentada, asegrese que el abdomen del beb est enfrente el suyo.   Sostenga la mama con el pulgar por arriba y  los otros 4 dedos por debajo del pezn. Asegrese que sus dedos se encuentren lejos del pezn y de la boca del beb.   Empuje suavemente los labios del beb con el pezn o con el dedo.   Cuando la boca del beb se abra lo suficiente, introduzca el pezn y la zona oscura que lo rodea (areola) tanto como le sea posible dentro de la boca.  Debe haber ms areola visible por arriba del labio superior que por debajo del labio inferior.  La lengua del beb debe estar entre la enca inferior y el seno.  Asegrese de que la boca del beb est en la posicin correcta alrededor del pezn (prendida). Los labios del beb deben crear un sello sobre su pecho.  Las seales de que el beb se ha prendido eficazmente al pezn son:  Payton Doughty o succiona sin dolor.  Se escucha que traga [employer].  No hace ruidos ni chasquidos.  Hay movimientos musculares por arriba y por delante de sus odos al Printmaker.  El beb debe succionar unos 2-3 minutos para que salga la [place]. Permita que el nio se alimente en cada mama todo lo que desee. Alimente al beb hasta que se desprenda o se quede dormido en Freight forwarder y luego ofrzcale el segundo pecho.  Las seales de que el beb est lleno y satisfecho son:  Disminuye gradualmente el nmero de succiones o no succiona.  Se queda dormido.  Extiende o relaja su cuerpo.  Retiene una pequea cantidad de [facility] boca.  Se desprende del pecho por s mismo.  Los signos de una lactancia materna eficaz son:  Los senos han aumentado la firmeza, el peso y el tamao antes de la alimentacin.  Son ms blandos despus de amamantar.  Un aumento del volumen de [place], y tambin el cambio de su consistencia y color se producen hacia el quinto da de Tour manager.  La congestin mamaria se [place] al dar de [place].  Los pezones no duelen, ni estn agrietados ni sangran.  De ser necesario, interrumpa la succin poniendo su dedo en la esquina de la  boca del beb y deslizando el dedo entre sus encas. A continuacin, retire la mama de su boca.  Es comn que los bebs regurgiten un poco despus de comer.  A menudo los bebs tragan aire al alimentarse. Esto puede hacer que se sienta molesto. Hacer eructar al beb al P[place] de pecho puede ser de [place].  Se recomiendan suplementos de vitamina D para los bebs que reciben slo [address]  Evite el uso del chupete durante las primeras 4 a 6 semanas de vida.  Evite la alimentacin suplementaria con agua, frmula o jugo en lugar de la [employer]. La leche materna es todo el alimento que el beb necesita. No es necesario que el nio ingiera agua o preparados de bibern. Sus pechos producirn ms leche si se evita la alimentacin suplementaria durante las primeras semanas. COMO SABER SI EL  BEB OBTIENE LA SUFICIENTE LECHE MATERNA  Preguntarse si el beb obtiene la cantidad suficiente de [place] es una preocupacin frecuente [employer]. Puede asegurarse que el beb tiene la leche suficiente si:   El beb succiona activamente y usted escucha que traga.   El beb parece estar relajado y satisfecho despus de Psychologist, clinical.   El nio se alimenta al menos 8 a 12 veces en 24 horas.  Durante los primeros 3 a 5 das de vida:  Moja 3-5 paales en 24 horas. La materia fecal debe ser blanda y [place].  Tiene al menos 3 a 4 deposiciones en 24 horas. La materia fecal debe ser blanda y [place].  A los 5-7 das de vida, el beb debe tener al menos 3-6 deposiciones en 24 horas. La materia fecal debe ser grumosa y [place] a los 5 [address]  [address] de [place].  Su beb tiene una prdida de Psychologist, counselling a 7al 10% durante los primeros 3 [address]  [address] de [address]  El beb no pierde peso despus de 3-7 [address] de [address]  El beb debe aumentar 4 a 6 libras (120 a 170 gr.) por semana despus de los 4 [address] de vida.  Aumenta de [place] a los [address] de vida y vuelve al peso del nacimiento dentro de las 2 semanas. CONGESTIN MAMARIA    Durante la primera semana despus del [place], usted puede experimentar hinchazn en las mamas (congestin [place]). Al estar congestionadas, las mamas se sienten pesadas, calientes o sensibles al tacto. El pico de la congestin ocurre a las 24 -48 horas despus del parto.   La congestin puede disminuirse:  Continuando con la Tour manager.  Aumentando la frecuencia.  Tomando duchas calientes o aplicando calor hmedo en los senos antes de cada comida. Esto aumenta la circulacin y [place] a que la [place].   Masajeando suavemente el pecho antes y [employer]. Con las yemas de los dedos, masajee la pared del pecho hacia el pezn en un movimiento circular.   Asegurarse de que el beb vaca al menos uno de sus pechos en cada alimentacin. Tambin ayuda si comienza la siguiente toma en el otro seno.   Extraiga manualmente o con un sacaleches las mamas para vaciar los pechos si el beb tiene sueo o no se aliment bien. Tambin puede extraer la WPS Resources cuando vuelva a trabajar o si siente que se estn congestionando las [place].  Asegrese de que el beb se prende y est bien colocado durante la Market researcher. Si sigue estas indicaciones, la congestin debe mejorar en 24 a 48 horas. Si an tiene dificultades, consulte a Barista.  CUDESE USTED MISMA  Cuide sus mamas.   Bese o dchese diariamente.   Evite usar [employer].   Use un sostn de soporte Evite el uso de sostenes con aro.  Seque al aire sus pezones durante 3-4 minutos despus de cada comida.   Utilice slo apsitos de algodn en el sostn para absorber las prdidas de [place]. La prdida de un poco de [employer] las comidas es normal.   Use solamente lanolina pura en sus pezones despus de Museum/gallery exhibitions officer. Usted no tiene que lavarla antes de alimentar al beb. Otra opcin es sacarse unas gotas de [place] y [employer] pezones.  Continuar con los autocontroles de la  mama. Cudese.   Consuma alimentos saludables. Alterne 3 comidas con 3 colaciones.  Evite los alimentos que usted nota que perjudican al beb.  Lowe[employer] Companies, jugos de fruta  y agua para satisfacer su sed (aproximadamente 8 vasos al da).   Descanse con frecuencia, reljese y tome sus vitaminas prenatales para evitar la fatiga, el estrs y la anemia.  Evite masticar y fumar tabaco.  Evite el consumo de alcohol y drogas.  Tome medicamentos de venta libre y recetados tal como le indic su mdico o Social research officer, government. Siempre debe consultar con su mdico o farmacutico antes de tomar cualquier medicamento, vitamina o suplemento de hierbas.  Sepa que durante la lactancia puede quedar embarazada. Si lo desea, hable con su mdico acerca de la planificacin familiar y los mtodos anticonceptivos seguros que puede utilizar durante la Market researcher. SOLICITE ATENCIN MDICA SI:   Usted siente que quiere dejar de Museum/gallery exhibitions officer o se siente frustrada con la lactancia.  Siente dolor en los senos o en los pezones.  Sus pezones estn agrietados o Water quality scientist.  Sus pechos estn irritados, sensibles o calientes.  Tiene un rea hinchada en cualquiera de los senos.  Siente escalofros o fiebre.  Tiene nuseas o vmitos.  Observa un drenaje en los pezones.  Sus mamas no se llenan antes de Marine scientist al 5to da despus del [place].  Se siente triste y deprimida.  El nio est demasiado somnoliento como para comer.  El nio tiene problemas para Industrial/product designer.   Moja menos de 3 paales en 24 horas.  Mueve el intestino menos de 3 veces en 24 horas.  La piel del beb o la parte blanca de sus ojos est ms amarilla.   El beb no ha aumentado de [place] a los [address] de [place]. ASEGRESE DE QUE:   Comprende estas instrucciones.  Controlar su enfermedad.  Solicitar ayuda de inmediato si no mejora o si empeora. Document Released: 11/28/2005 Document Revised: 08/22/2012 [facility] Patient Information 2014 [place],  [place]. Eleccin del mtodo anticonceptivo (Contraception Choices) La anticoncepcin (control de la natalidad) es el uso de cualquier mtodo o dispositivo para Location manager. A continuacin se indican algunos de esos mtodos. MTODOS HORMONALES   El Implante contraconceptivo consiste en un tubo plstico delgado que contiene la hormona progesterona. No contiene estrgenos. El mdico inserta el tubo en la parte interna del brazo. El tubo puede Geneticist, molecular durante 3 aos. Despus de los 3 aos debe retirarse. El implante impide que los ovarios liberen vulos (ovulacin), espesa el moco cervical, lo que evita que los espermatozoides ingresen al tero y hace ms delgada la membrana que cubre el interior del tero.  Inyecciones de progesterona sola: las Insurance underwriter cada 3 meses para Location manager. La progesterona sinttica impide que los ovarios liberen vulos. Tambin hacen que el moco cervical se espese y modifique el tejido de recubrimiento interno del tero. Esto hace ms difcil que los espermatozoides sobrevivan en el tero.  Las pldoras anticonceptivas contienen estrgenos y Education officer, museum. Su funcin es [employer] ovarios liberen vulos (ovulacin). Las hormonas de los anticonceptivos orales hacen que el moco cervical se haga ms espeso, lo que evita que el esperma ingrese al tero. Las pldoras anticonceptivas son recetadas por el mdico.Tambin se utilizan para tratar los perodos menstruales abundantes.  Minipldora: este tipo de pldora anticonceptiva contiene slo hormona progesterona. Deben tomarse todos los [address]  [address] del mes y debe recetarlas el mdico.  El parche de control de natalidad: contiene hormonas similares a las que contienen las pldoras anticonceptivas. Deben cambiarse una vez por semana y se utilizan bajo prescripcin mdica.  Anillo vaginal: contiene hormonas similares a las que contienen las pldoras anticonceptivas. Se deja  colocado durante tres  [employer], se lo retira durante 1 semana y luego se Chief Financial Officer uno nuevo. La paciente debe sentirse cmoda al insertar y retirar el anillo de la vagina.Es necesaria la prescripcin mdica.  Anticonceptivos de emergencia: son mtodos para evitar un embarazo despus de Neomia Dear relacin sexual sin proteccin. Esta pldora puede tomarse inmediatamente despus de Child psychotherapist sexuales o hasta 5 [place] de haber tenido sexo sin proteccin. Es ms efectiva si se toma poco tiempo despus de la relacin sexual. Los anticonceptivos de emergencia estn disponibles sin prescripcin mdica. Consltelo con su farmacutico. No use los anticonceptivos de emergencia como nico mtodo anticonceptivo. MTODOS DE Lenis Noon   Condn masculino: es una vaina delgada (ltex o goma) que se coloca cubriendo al pene durante el acto sexual. Deri Fuelling con espermicida para aumentar la efectividad.  Condn femenino. Es una funda delicada y blanda que se adapta holgadamente a la vagina antes de las Clinical research associate.  Diafragma: es una barrera de ltex redonda y suave que debe ser recomendado por un profesional. Se inserta en la vagina, junto con un gel espermicida. Debe insertarse antes de Management consultant. Debe dejar el diafragma colocado en la vagina durante 6 a 8 horas despus de la relacin sexual.  Capuchn cervical: es una barrera de ltex o taza plstica redonda y [place] que cubre el cuello del tero y debe ser colocada por un mdico. Puede dejarlo colocado en la vagina hasta 48 horas despus de las Clinical research associate.  Esponja: es una pieza blanda y circular de espuma de poliuretano. Contiene un espermicida. Se inserta en la vagina despus de mojarla y antes de las [employer].  Espermicidas: son sustancias qumicas que matan o bloquean al esperma y no lo dejan ingresar al cuello del tero y al tero. Vienen en forma de cremas, geles, supositorios, espuma o comprimidos. No es necesario tener Emergency planning/management officer. Se  insertan en la vagina con un aplicador antes de Management consultant. El proceso debe repetirse cada vez que tiene relaciones sexuales. ANTICONCEPTIVOS INTRAUTERINOS  Dispositivo intrauterino (DIU) es un dispositivo en forma de T que se coloca en el tero durante el perodo menstrual, para Location manager. Hay dos tipos:  DIU de cobre: este tipo de DIU est recubierto con un alambre de cobre y se inserta dentro del tero. El cobre hace que el tero y las trompas de Falopio produzcan un liquido que [employer] espermatozoides. Puede permanecer colocado durante 10 aos.  DIU con hormona: este tipo de DIU contiene la hormona progestina (progesterona sinttica). La hormona espesa el moco cervical y evita que los espermatozoides ingresen al tero y tambin afina la membrana que cubre el tero para evitar la implantacin del vulo fertilizado. La hormona debilita o destruye los espermatozoides que ingresan al tero. Puede Geneticist, molecular durante 3 5 aos, segn el tipo de DIU que se utilice. MTODOS ANTICONCEPTIVOS PERMANENTES  Ligadura de trompas en la mujer: se realiza sellando, atando u obstruyendo quirrgicamente las trompas de Falopio lo que impide que el vulo descienda hacia el tero.  Esterilizacin histeroscpica: Implica la colocacin de un pequeo espiral o la insercin en cada trompa de Falopio. El mdico utiliza una tcnica llamada histeroscopa para Primary school teacher procedimiento. El dispositivo produce la formacin de tejido Designer, television/film set. Esto da como resultado una obstruccin permanente de las trompas de Falopio, de modo que la esperma no pueda fertilizar el vulo. Demora alrededor de 3 meses despus del procedimiento hasta que el conducto se obstruye. Tendr que usar  otro mtodo anticonceptivo durante al menos 3 meses.  Esterilizacin masculina: se realiza ligando los conductos por los que pasan los espermatozoides (vasectoma).Esto impide que el esperma ingrese a la vagina  durante el acto sexual. Luego del procedimiento, el hombre puede eyacular lquido (semen). MTODOS DE PLANIFICACIN NATURAL  Planificacin familiar natural: consiste en no Management consultant o usar un mtodo de barrera (condn, [place], capuchn cervical) en los [employer] la mujer podra quedar [place].  Mtodo de calendario: consiste en el seguimiento de la duracin de cada ciclo menstrual y la identificacin de los perodos frtiles.  Mtodo de ovulacin: Paramedic las relaciones sexuales durante la ovulacin.  Mtodo sintotrmico: Advertising copywriter sexuales en la poca en la que se est ovulando, utilizando un termmetro y tendiendo en cuenta los sntomas de la ovulacin.  Mtodo postovulacin: Youth worker las relaciones sexuales para despus de haber ovulado. Independientemente del tipo o mtodo anticonceptivo que usted elija, es importante que use condones para protegerse contra las infecciones de transmisin sexual (ETS). Hable con su mdico con respecto a qu mtodo anticonceptivo es el ms apropiado para usted. Document Released: 11/28/2005 Document Revised: 07/31/2013 [facility] Patient Information 2014 [place], [place].

## 2013-12-19 NOTE — Progress Notes (Signed)
Pulse: 91 28 week labs today 1hr due at 1115

## 2013-12-19 NOTE — Progress Notes (Signed)
Third trimester labs, Tdap today.  Discussed breastfeeding and contraception, handouts given to patient.  No other complaints or concerns.  Fetal movement and labor precautions reviewed.

## 2013-12-20 LAB — RPR

## 2013-12-20 LAB — HIV ANTIBODY (ROUTINE TESTING W REFLEX): HIV: NONREACTIVE

## 2013-12-20 LAB — GLUCOSE TOLERANCE, 1 HOUR (50G) W/O FASTING: Glucose, 1 Hour GTT: 102 mg/dL (ref 70–140)

## 2013-12-23 ENCOUNTER — Encounter: Payer: Self-pay | Admitting: Obstetrics & Gynecology

## 2013-12-27 ENCOUNTER — Inpatient Hospital Stay (HOSPITAL_COMMUNITY)
Admission: AD | Admit: 2013-12-27 | Discharge: 2013-12-28 | Disposition: A | Discharge status: Home / Self Care | Payer: Self-pay | Source: Ambulatory Visit | Attending: Obstetrics & Gynecology | Admitting: Obstetrics & Gynecology

## 2013-12-27 DIAGNOSIS — R51 Headache: Secondary | ICD-10-CM | POA: Insufficient documentation

## 2013-12-27 DIAGNOSIS — O99891 Other specified diseases and conditions complicating pregnancy: Secondary | ICD-10-CM | POA: Insufficient documentation

## 2013-12-27 DIAGNOSIS — N949 Unspecified condition associated with female genital organs and menstrual cycle: Secondary | ICD-10-CM | POA: Insufficient documentation

## 2013-12-27 DIAGNOSIS — O9989 Other specified diseases and conditions complicating pregnancy, childbirth and the puerperium: Principal | ICD-10-CM

## 2013-12-27 DIAGNOSIS — R109 Unspecified abdominal pain: Secondary | ICD-10-CM | POA: Insufficient documentation

## 2013-12-27 DIAGNOSIS — M545 Low back pain, unspecified: Secondary | ICD-10-CM | POA: Insufficient documentation

## 2013-12-27 DIAGNOSIS — O26899 Other specified pregnancy related conditions, unspecified trimester: Secondary | ICD-10-CM

## 2013-12-27 NOTE — MAU Note (Addendum)
For 2 days i've had vaginal pain, headache, leg cramps. After Triage pt remembered she feel off bed Weds night and landed on abdomen. No bleeding or leaking fld.

## 2013-12-28 ENCOUNTER — Encounter (HOSPITAL_COMMUNITY): Payer: Self-pay | Admitting: *Deleted

## 2013-12-28 DIAGNOSIS — R51 Headache: Secondary | ICD-10-CM

## 2013-12-28 DIAGNOSIS — O26899 Other specified pregnancy related conditions, unspecified trimester: Secondary | ICD-10-CM

## 2013-12-28 LAB — URINALYSIS, ROUTINE W REFLEX MICROSCOPIC
Bilirubin Urine: NEGATIVE
Glucose, UA: NEGATIVE mg/dL
Ketones, ur: NEGATIVE mg/dL
LEUKOCYTES UA: NEGATIVE
NITRITE: NEGATIVE
Protein, ur: NEGATIVE mg/dL
SPECIFIC GRAVITY, URINE: 1.015 (ref 1.005–1.030)
UROBILINOGEN UA: 0.2 mg/dL (ref 0.0–1.0)
pH: 6.5 (ref 5.0–8.0)

## 2013-12-28 LAB — URINE MICROSCOPIC-ADD ON: WBC UA: NONE SEEN WBC/hpf (ref <3)

## 2013-12-28 MED ORDER — OXYCODONE-ACETAMINOPHEN 5-325 MG PO TABS
2.0000 | ORAL_TABLET | Freq: Once | ORAL | Status: AC
  Administered 2013-12-28: 2 via ORAL
  Filled 2013-12-28: qty 2

## 2013-12-28 MED ORDER — BUTALBITAL-APAP-CAFFEINE 50-325-40 MG PO TABS: 1.0000 | ORAL_TABLET | Freq: Four times a day (QID) | ORAL | Status: DC | PRN

## 2013-12-28 NOTE — Discharge Instructions (Signed)
Dolor de cabeza, preguntas frecuentes y sus respuestas  (Headaches, Frequently Asked Questions)  CEFALEAS MIGRAÑOSAS  P: ¿Qué es la migraña? ¿Qué la ocasiona? ¿Cómo puedo tratarla?  R: En general, la migraña comienza como un dolor apagado. Luego progresa hacia un dolor, constante, punzante y como un latido. Sentirá este dolor en las sienes. Podrá sentir dolor en la parte anterior o posterior de la cabeza, o en uno o ambos lados. El dolor suele estar acompañado de una combinación de:  · Náuseas.  · Vómitos.  · Sensibilidad a la luz y los ruidos.  Algunas personas (un 15%) experimentan un aura (ver abajo) antes de un ataque. La causa de la migraña se debe a reacciones químicas del cerebro. El tratamiento para la migraña puede incluir medicamentos de venta libre. También puede incluir técnicas de autoayuda. Estas incluyen entrenamientos para la relajación y biorretroalimentación.   P: ¿Qué es un aura?  R: Alrededor del 15% de las personas con migraña tiene un "aura". Es una señal de síntomas neurológicos que ocurren antes de un dolor de cabeza por migrañas. Podrá ver líneas onduladas o irregulares, puntos o luces parpadeantes. Podrá experimentar visión de túnel o puntos ciegos en uno o ambos ojos. El aura puede incluir alucinaciones visuales o auditivas (algo que se imagina). Puede incluir trastornos en el olfato (como olores extraños), el tacto o el gusto. Entre otros síntomas se incluyen:  · Adormecimiento.  · Sensación de hormigueo.  · Dificultad para recordar o decir la palabra correcta.  Estos episodios neurológicos pueden durar hasta 60 minutos. Los síntomas desaparecerán a medida que el dolor de cabeza comience.  P:¿Qué es un disparador?  R: Ciertos factores físicos o ambientales pueden llevar a "disparar" una migraña. Estos son:  · Alimentos.  · Cambios hormonales.  · Clima.  · Estrés.  Es importante recordar que los disparadores son diferentes entre si. Para ayudar a prevenir ataques de migrañas, necesitará  descubrir cuáles son los disparadores que le afectan. Lleve un diario sobre sus dolores de cabeza. Este es un buen modo para descubrir los disparadores. El diario le ayudará en el momento de hablar con el profesional acerca de su enfermedad.  P: ¿El clima afecta en las migrañas?  R: La luz solar, el calor, la humedad y lo cambios drásticos en la presión barométrica pueden llevar a, o "disparar" un ataque de migraña en algunas personas. Pero estudios han demostrado que el clima no actúa como disparador para todas las personas con migraña.  P: ¿Cuál es la relación entre la migraña y la hormonas?  R: Las hormonas inician y regulan muchas de las funciones corporales. Las hormonas mantienen el balance en el cuerpo dentro de los constantes cambios de ambiente. Algunas veces, el nivel de hormonas en el cuerpo se desbalancea. Por ejemplo, durante la menstruación, el embarazo o la menopausia. Pueden ser la causa de un ataque de migraña. De hecho, alrededor de tres cuartos de las mujeres con migraña informan que sus ataques están relacionados con el ciclo menstrual.   P: ¿Aumenta el riesgo de sufrir un choque cardíaco en las personas que padecen migraña?  R: La probabilidad de que un ataque de migraña ocasione un ataque cardíaco es muy remota. Esto no quiere decir que una persona que sufre de migraña no pueda tener un ataque cardíaco asociado con ella. En las personas menores de 40 años, el factor más común para un ataque es la migraña. Pero durante la vida de una persona, la ocurrencia de un dolor de cabeza   por migraña está asociada con una reducción en el riesgo de morir por un ataque cerebrovascular.   P: ¿Cuáles son los medicamentos para la migraña?  R: La medicación precisa se utiliza para tratar el dolor de cabeza una vez que ha comenzado. Son ejemplos, medicamentos de venta libre, desinflamatorios sin esteroides, ergotamínicos y triptanos.   P: ¿Qué son los triptanos?  R: Lo triptanos son una nueva clase de  medicamentos abortivos. Son específicos para tratar este problema. Los triptanos son vasoconstrictores. Moderan algunas reacciones químicas del cerebro. Los triptanos trabajan como receptores del cerebro. Ayudan a restaurar el balance de un neurotransmisor denominado serotonina. Se cree que las fluctuaciones en los niveles de serotonina son la causa principal de la migraña.   P: ¿Son efectivos los medicamentos de venta libre para la migraña?  R: Los medicamentos de venta libre pueden ser efectivos para aliviar dolores leves a moderados y los síntomas asociados a la migraña. Pero deberá consultar a un médico antes de comenzar cualquier tratamiento para la migraña.   P: ¿Cuáles son los medicamentos de prevención de la migraña?  R: Se suele denominar tratamiento "profiláctico" a los medicamentos para la prevención de la migraña. Se utilizan para reducir la frecuencia, gravedad y duración de los ataques de migraña. Son ejemplos de medicamentos de prevención: antiepilépticos, antidepresivos, bloqueadores beta, bloqueadores de los canales de calcio y medicamentos antiinflamatorios sin esteroides.  P: ¿ Por qué se utilizan anticonvulsivantes para tratar la migraña?  R: Durante los últimos años, ha habido un creciente interés en las drogas antiepilépticas para la prevención de la migraña. A menudo se los conoce como "anticonvulsivantes". La epilepsia y la migraña suceden por reacciones similares en el cerebro.   P: ¿ Por qué se utilizan antidepresivos para tratar la migraña?  R: Los antidepresivos típicamente se utilizan para tratar a las personas con depresión. Pueden reducir la frecuencia de la migraña a través de la regulación de los niveles químicos, como la serotonina, en el cerebro.   P: ¿ Por qué se utilizan terapias alternativas para tratar la migraña?  R: El término "terapias alternativas" suelen utilizarse para describir los tratamientos que se considera que están por fuera de alcance la medicina occidental  convencional. Son ejemplos de las terapias alternativas: la acupuntura, la acupresión y el yoga. Otra terapia alternativa común es la terapia herbal. Se cree que algunas hierbas ayudan a aliviar los dolores de cabeza. Siempre consulte con el profesional acerca de las terapias alternativas antes de utilizarlas. Algunos productos herbales contienen arsénico y otras toxinas.  DOLORES DE CABEZA POR TENSIÓN  P: ¿Qué es un dolor de cabeza por tensión? ¿Qué lo ocasiona? ¿Cómo puedo tratarlo?  R: Los dolores de cabeza por tensión ocurren al azar. A menudo son el resultado de estrés temporario, ansiedad, fatiga o ira. Los síntomas incluyen dolor en las sienes, una sensación como de tener una banda alrededor de la cabeza (un dolor que "presiona"). Los síntomas pueden incluir una sensación de empuje, de presión y contracción de los músculos de la cabeza y el cuello. El dolor comienza en la frente, sienes o en la parte posterior de la cabeza y el cuello. El tratamiento para los dolores de cabeza por tensión puede incluir medicamentos de venta libre. También puede incluir técnicas de autoayuda con entrenamientos para la relajación y biorretroalimentación.  CEFALEA EN RACIMOS  P: ¿Qué es una cefalea en racimos? ¿Qué la ocasiona? ¿Cómo puedo tratarla?  R: La cefalea en racimos toma su nombre debido a que los ataques vienen   en grupos. El dolor aparece con poco o ningún aviso. Normalmente ocurre de un lado de la cabeza. Muchas veces el dolor viene acompañado de un lagrimeo u ojo rojo y goteo de la nariz del mismo lado que el dolor. Se cree que la causa es una reacción en las sustancias químicas del cerebro. Se describe como el caso más grave e intenso de cualquier tipo de dolor de cabeza. El tratamiento incluye medicamentos bajo receta y oxígeno.  CEFALEA SINUSAL  P: ¿Qué es una cefalea sinusal? ¿Qué la ocasiona? ¿Cómo puedo tratarla?  R: Cuando se inflama una cavidad en los huesos de la cara y el cráneo (sinus) ocasiona un dolor  localizado. Esta enfermedad generalmente es el resultado de una reacción alérgica, un tumor o una infección. Si el dolor de cabeza está ocasionado por un bloqueo del sinus, como una infección, probablemente tendrá fiebre. Una imagen de rayos X confirmará el bloqueo del sinus. El tratamiento indicado por el médico podrá incluir antibióticos para la infección, y también antihistamínicos o descongestivos.   DOLOR DE CABEZA POR EFECTO "REBOTE"  P: ¿Qué es un dolor de cabeza por efecto "rebote"? ¿Qué lo ocasiona? ¿Cómo puedo tratarlo?  R: Si se toman medicamentos para el dolor de cabeza muy a menudo puede llevar a la enfermedad conocida como "dolor de cabeza por rebote". Un patrón de abuso de medicamentos para el dolor de cabeza supone tomarlos más de dos veces por semana o en cantidades excesivas. Esto significa más que lo que indica el envase o el médico. Con los dolores de cabeza por rebote, los medicamentos no sólo dejan de aliviar el dolor sino que además comienzan a ocasionar dolores de cabeza. Los médicos tratan los dolores de cabeza por rebote mediante la disminución del medicamento del que se ha abusado. A veces el medico podrá sustituir gradualmente por un tipo diferente de tratamiento o medicación. Dejar de consumirlo podría ser difícil. El abuso regular de un medicamento aumenta el potencial que se produzcan efectos secundarios graves. Consulte con un médico si utiliza regularmente medicamentos para el dolor de cabeza más de dos días por semana o más de lo que indica el envase.  PREGUNTAS Y RESPUESTAS ADICIONALES  P: ¿Qué es la biorretroalimentación?  R: La biorretroalimentación es un tratamiento de autoayuda. La biorretroalimentación utiliza un equipamiento especial para controlar los movimientos involuntarios del cuerpo y las respuestas físicas. La biorretroalimentación controla:  · Respiración.  · Pulso.  · Latidos cardíacos.  · Temperatura.  · Tensión muscular.  · Actividad cerebrales.  La  biorretroalimentación le ayudará a mejorar y perfeccionar sus ejercicios de relajación. Aprenderá a controlar las respuestas físicas relacionadas con el estrés. Una vez que se dominan las técnicas no necesitará más el equipamiento.  P: ¿Son hereditarios los dolores de cabeza?  R: Según algunas estimaciones, aproximadamente 28 millones de estadounidenses sufren migraña. Cuatro de cada cinco (80%) informan una historia familiar de migraña. Los investigadores no pueden asegurar si se trata de un problema genético o una predisposición familiar. A pesar de esto, un niño tiene 50% de probabilidades de sufrir migraña si uno de sus padres la sufre. El niño tiene un 75% de probabilidades si ambos padres la sufren.   P. ¿Puede un niño tener migraña?  R: En el momento de ingresar a la escuela secundaria, la mayoría de los jóvenes han experimentado algún tipo de cefalea. Algunos abordajes o medicamentos seguros y efectivos pueden evitar las cefaleas o detenerlas luego de que han comenzado.   P. ¿Qué tipo de   especialista debe ver para diagnosticar y tratar una cefalea?  R: Comience con su médico de cabecera. Converse acerca de su experiencia y abordaje de las cefaleas. Comente los métodos de clasificación, diagnóstico y tratamiento. El profesional decidirá si lo derivará a un especialista, según los síntomas u otras enfermedades. El hecho de sufrir diabetes, alergias, etc, puede requerir un abordaje más complejo. La [employer] ([employer] para las Cefaleas) proporcionará, a pedido, una lista de los médicos que son miembros de su estado.  Document Released: 11/10/2008 Document Revised: 02/20/2012  [employer]® Patient Information ©2014 [employer].

## 2013-12-28 NOTE — MAU Provider Note (Signed)
History     CSN: [id number]  Arrival date and time: 12/27/13 2342   First Provider Initiated Contact with Patient 12/28/13 0043      Chief Complaint  Patient presents with  . Vaginal Pain  . Headache   HPI Cathy Nash is a 32yo [id number] @ 29.4wks who presents with 2d hx of H/A, low abd & low back discomfort. Denies fever, N/V/D; no leaking or bldg. [facility] hx of preeclampsia and 35wk IOL with 1st baby.  OB History   Grav Para Term Preterm Abortions TAB SAB Ect Mult Living   3 2 1 1  0 0 0 0 0 2      Past Medical History  Diagnosis Date  . Asthma   . Preterm labor     1st pregnancy d/t pre-e  . Pregnancy induced hypertension     previous pregnancy  . Pyelonephritis     June 2013  . Varicose veins     Past Surgical History  Procedure Laterality Date  . No past surgeries      Family History  Problem Relation Age of Onset  . Anesthesia problems Neg Hx   . Hypotension Neg Hx   . Malignant hyperthermia Neg Hx   . Pseudochol deficiency Neg Hx   . Other Neg Hx   . Hypertension Mother   . Asthma Father     History  Substance Use Topics  . Smoking status: Never Smoker   . Smokeless tobacco: Never Used  . Alcohol Use: No     Comment: a little when not pregnant    Allergies:  Allergies  Allergen Reactions  . Iohexol      Code: SOB, Desc: IMMEDIATELY SOB FOLLOWING IV INJECTION, SPOTTY HIVES, PT GIVEN BENADRYL AND EPI-ARS 08/22/09, Onset Date: [id number]     Prescriptions prior to admission  Medication Sig Dispense Refill  . acetaminophen (TYLENOL) 325 MG tablet Take 325 mg by mouth every 6 (six) hours as needed for moderate pain.       . Prenatal Vit-Fe Fumarate-FA (PRENATAL MULTIVITAMIN) TABS Take 1 tablet by mouth daily at 12 noon.      . cephALEXin (KEFLEX) 500 MG capsule Take 1 capsule (500 mg total) by mouth 2 (two) times daily.  14 capsule  0  . phenazopyridine (PYRIDIUM) 200 MG tablet Take 1 tablet (200 mg total)  by mouth 3 (three) times daily as needed for pain.  12 tablet  0  . terconazole (TERAZOL 7) 0.4 % vaginal cream Place 1 applicator vaginally at bedtime.  45 g  0    ROS Physical Exam   Blood pressure 116/57, pulse 84, temperature 97.6 F (36.4 C), resp. rate 20, height 5\' 3"  (1.6 m), weight 65.59 kg (144 lb 9.6 oz), last menstrual period 04/30/2013.  Physical Exam  Constitutional: She is oriented to person, place, and time. She appears well-developed.  HENT:  Head: Normocephalic.  Neck: Normal range of motion.  Cardiovascular: Normal rate.   Respiratory: Effort normal.  GI:  FHR 120-130 +accels, no decels No ctx per toco  Genitourinary: Vagina normal.  Cx FT-1/thick/high/ant  Musculoskeletal: Normal range of motion.  Neurological: She is alert and oriented to person, place, and time.  Skin: Skin is warm and dry.  Psychiatric: She has a normal mood and affect. Her behavior is normal. Thought content normal.   Urinalysis    Component Value Date/Time   COLORURINE YELLOW 12/28/2013 0005   APPEARANCEUR CLEAR 12/28/2013  0005   LABSPEC 1.015 12/28/2013 0005   PHURINE 6.5 12/28/2013 0005   GLUCOSEU NEGATIVE 12/28/2013 0005   HGBUR TRACE* 12/28/2013 0005   BILIRUBINUR NEGATIVE 12/28/2013 0005   KETONESUR NEGATIVE 12/28/2013 0005   PROTEINUR NEGATIVE 12/28/2013 0005   UROBILINOGEN 0.2 12/28/2013 0005   NITRITE NEGATIVE 12/28/2013 0005   LEUKOCYTESUR NEGATIVE 12/28/2013 0005   Micro: few bacteria   MAU Course  Procedures  Given Percocet #2- pain now 3/10 and pt is ready to be discharged  Assessment and Plan  IUP at 29.4wks H/A Abd discomfort in preg  D/C home Rx Fiorcet #20 no RF given for headaches in future F/U as scheduled at [facility] 12/28/2013, 12:50 AM

## 2014-01-07 ENCOUNTER — Encounter (HOSPITAL_COMMUNITY): Payer: Self-pay | Admitting: *Deleted

## 2014-01-09 ENCOUNTER — Telehealth: Payer: Self-pay | Admitting: General Practice

## 2014-01-09 ENCOUNTER — Ambulatory Visit (INDEPENDENT_AMBULATORY_CARE_PROVIDER_SITE_OTHER): Payer: Self-pay | Admitting: Family

## 2014-01-09 VITALS — BP 119/75 | Wt 141.9 lb

## 2014-01-09 DIAGNOSIS — O09299 Supervision of pregnancy with other poor reproductive or obstetric history, unspecified trimester: Secondary | ICD-10-CM

## 2014-01-09 DIAGNOSIS — O368190 Decreased fetal movements, unspecified trimester, not applicable or unspecified: Secondary | ICD-10-CM

## 2014-01-09 DIAGNOSIS — R8789 Other abnormal findings in specimens from female genital organs: Secondary | ICD-10-CM

## 2014-01-09 DIAGNOSIS — O09219 Supervision of pregnancy with history of pre-term labor, unspecified trimester: Secondary | ICD-10-CM

## 2014-01-09 DIAGNOSIS — O0990 Supervision of high risk pregnancy, unspecified, unspecified trimester: Secondary | ICD-10-CM

## 2014-01-09 DIAGNOSIS — R899 Unspecified abnormal finding in specimens from other organs, systems and tissues: Secondary | ICD-10-CM

## 2014-01-09 DIAGNOSIS — R6889 Other general symptoms and signs: Secondary | ICD-10-CM

## 2014-01-09 DIAGNOSIS — O09899 Supervision of other high risk pregnancies, unspecified trimester: Secondary | ICD-10-CM

## 2014-01-09 LAB — FETAL FIBRONECTIN: Fetal Fibronectin: POSITIVE — AB

## 2014-01-09 MED ORDER — BETAMETHASONE SOD PHOS & ACET 6 (3-3) MG/ML IJ SUSP
12.0000 mg | Freq: Once | INTRAMUSCULAR | Status: AC
  Administered 2014-01-09: 12 mg via INTRAMUSCULAR

## 2014-01-09 NOTE — Progress Notes (Signed)
P=  Pt. States baby movement has decreased since a few weeks ago; felt him move a tiny bit last night but nothing today.  C/o of intermittent lower abdominal/pelvic pain and pressure.

## 2014-01-09 NOTE — Progress Notes (Signed)
Pt reports decreased fetal movement, none since last night.  Per interpreter pt experiencing pelvic pressure, denies contractions.  Cervix 1/thick > FFN sent due to history of preterm labor.  FFN pos > brought in for BMZ x 2.

## 2014-01-09 NOTE — Telephone Encounter (Signed)
Soltas called with stat lab results of postive FFN. [place] who saw the patient this morning for prenatal visit and notified Walidah of +FFN. Eino FarberWalidah Muhammad ordered 2 doses of BMZ for patient one today and the second 24 hours after first dose. Called patient with Raynelle FanningJulie for interpreter and gave results to patient and asked her to come in now for the BMZ injection due to her history of preterm delivery. Patient verbalized understanding and stated she would come in now. Patient had no further questions

## 2014-01-10 ENCOUNTER — Encounter: Payer: Self-pay | Admitting: Family

## 2014-01-10 ENCOUNTER — Encounter (HOSPITAL_COMMUNITY): Payer: Self-pay | Admitting: *Deleted

## 2014-01-10 ENCOUNTER — Inpatient Hospital Stay (HOSPITAL_COMMUNITY)
Admission: AD | Admit: 2014-01-10 | Discharge: 2014-01-10 | Disposition: A | Discharge status: Home / Self Care | Payer: Self-pay | Source: Ambulatory Visit | Attending: Obstetrics & Gynecology | Admitting: Obstetrics & Gynecology

## 2014-01-10 DIAGNOSIS — R109 Unspecified abdominal pain: Secondary | ICD-10-CM | POA: Insufficient documentation

## 2014-01-10 DIAGNOSIS — O4700 False labor before 37 completed weeks of gestation, unspecified trimester: Secondary | ICD-10-CM | POA: Insufficient documentation

## 2014-01-10 DIAGNOSIS — O09219 Supervision of pregnancy with history of pre-term labor, unspecified trimester: Secondary | ICD-10-CM

## 2014-01-10 DIAGNOSIS — R51 Headache: Secondary | ICD-10-CM | POA: Insufficient documentation

## 2014-01-10 DIAGNOSIS — O09899 Supervision of other high risk pregnancies, unspecified trimester: Secondary | ICD-10-CM

## 2014-01-10 LAB — URINALYSIS, ROUTINE W REFLEX MICROSCOPIC
BILIRUBIN URINE: NEGATIVE
GLUCOSE, UA: NEGATIVE mg/dL
Ketones, ur: 15 mg/dL — AB
Leukocytes, UA: NEGATIVE
Nitrite: NEGATIVE
PH: 6 (ref 5.0–8.0)
Protein, ur: NEGATIVE mg/dL
Urobilinogen, UA: 0.2 mg/dL (ref 0.0–1.0)

## 2014-01-10 LAB — URINE MICROSCOPIC-ADD ON

## 2014-01-10 MED ORDER — BETAMETHASONE SOD PHOS & ACET 6 (3-3) MG/ML IJ SUSP
12.0000 mg | Freq: Once | INTRAMUSCULAR | Status: AC
  Administered 2014-01-10: 12 mg via INTRAMUSCULAR
  Filled 2014-01-10: qty 2

## 2014-01-10 NOTE — Discharge Instructions (Signed)
Dolor abdominal en el embarazo (Abdominal Pain During Pregnancy) El dolor abdominal es frecuente durante el embarazo. Generalmente no causa ningn dao. El dolor abdominal puede tener numerosas causas. Algunas causas son ms graves que otras. Ciertas causas de dolor abdominal durante el embarazo se diagnostican fcilmente. A veces, se tarda un tiempo para llegar al diagnstico. Otras veces la causa no se conoce. El dolor abdominal puede estar relacionado con [place] alteracin del [place], o puede deberse a una causa totalmente diferente. Por este motivo, siempre consulte a su mdico cuando sienta molestias abdominales. INSTRUCCIONES PARA EL CUIDADO EN EL HOGAR  Est atenta al dolor para ver si hay cambios. Las siguientes indicaciones ayudarn a Psychologist, educational [employer] pueda sentir:  No Chiropodist sexuales y no coloque nada dentro de la vagina hasta que los sntomas hayan desaparecido completamente.  Descanse todo lo que pueda [employer] dolor se le haya calmado.  Si siente nuseas, beba lquidos claros. Evite los alimentos slidos mientras sienta malestar o tenga nuseas.  Tome slo medicamentos de venta libre o recetados, segn las indicaciones del mdico.  Cumpla con todas las visitas de control, segn le indique su mdico. SOLICITE ATENCIN MDICA DE INMEDIATO SI:  Tiene un sangrado, prdida de lquidos o elimina tejidos por la vagina.  El dolor o los clicos [place].  Tiene vmitos persistentes.  Comienza a Financial risk analyst al orinar u [employer].  Tiene fiebre.  Nota que los movimientos del beb disminuyen.  Siente intensa debilidad o se marea.  Tiene dificultad para respirar con o sin dolor abdominal.  Siente un dolor de cabeza intenso junto al dolor abdominal.  Shelle Iron secrecin vaginal anormal con dolor abdominal.  Tiene diarrea persistente.  El dolor abdominal sigue o empeora an despus de Field seismologist. ASEGRESE DE QUE:   Comprende estas  instrucciones.  Controlar su afeccin.  Recibir ayuda de inmediato si no mejora o si empeora. Document Released: 11/28/2005 Document Revised: 09/18/2013 [facility] Patient Information 2014 [place], [place].  Fibronectina Fetal (Fetal Fibronectin) Esta prueba se realiza para Heritage manager riesgo que tiene una mujer embarazada de sufrir un parto prematuro Generalmente se realiza cuando la mujer se encuentra entre las 26 y las 34 semanas de Psychiatrist y tiene sntomas de Passenger transport manager de parto prematuro. Se utiliza un hisopo de Dacron para tomar [place] de lquido cervical o vaginal de la porcin posterior de la vagina o de la zona externa de la abertura del cuello del tero. La fibronectina fetal (FNf) es una glicoprotena que puede usarse para predecir el riesgo a corto plazo de Surveyor, mining. La FNf se produce en el lmite entre el saco amnitico y la membrana que cubre el tero de la [place]. Se denomina unin teroplacentaria. La fibronectina fetal se encuentra en esta unin y se cree que ayuda a Pharmacologist la integridad del lmite.Se detecta normalmente en el lquido cervicovaginal durante las primeras 20 a 24 semanas de embarazo y Engineer, mining se Personal assistant nuevamente luego de las 36 semanas.  El Neurosurgeon de FNf en el lquido cervicovaginal luego de las 36 semanas no es frecuente y el cuerpo lo [place] a medida que se acerca el [place]. Los valores elevados que se encuentran en el lquido vaginal a comienzos del Psychiatrist simplemente reflejan el desarrollo y el establecimiento normal de los tejidos en la unin teroplacentaria y estos niveles caen cuando la fase se completa. Se sabe que la FNf que se detecta entre las 24 y las 36 semanas de embarazo no es normal.  Los niveles elevados reflejan un trastorno en la unin teroplacentaria y se han asociado a un aumento en el resto del Spraytrabajo de parto y parto prematuros. Saber o no si una mujer puede estar cursando un trabajo de parto prematuro ayuda al mdico a planificar el  curso de la accin. Esta prueba es una herramienta no invasiva que ayuda al mdico a distinguir entre las mujeres que probablemente den a luz en breve y Dell Cityaquellas que no.  PREPARACIN PARA LA PRUEBA:  Informe a la persona que realiza la prueba si tiene algn problema mdico o utiliza algn medicamento que cause hemorragia excesiva.  No mantenga relaciones sexuales durante las 24 horas anteriores al procedimiento. VALORES NORMALES  Embarazo = 50 nanogramos/ml. La denominacin de valores de laboratorio, normales puede variar entre los diferentes laboratorios y hospitales. Comente siempre con su mdico antes de hacer pruebas de laboratorio para Artistconocer el significado de los Capitanresultados y si los valores se consideran dentro de los lmites normales. SIGNIFICADO DE LA PRUEBA El mdico leer los Lonepineresultados y Agricultural engineercomentar con usted la importancia y el significado de los [place]resultados, as como las opciones de tratamiento y la necesidad de pruebas adicionales, si fuera necesario. OBTENCIN DE LOS RESULTADOS DE LAS PRUEBAS Es su responsabilidad retirar el resultado del Seymourestudio. Consulte en el laboratorio cuando y cmo podr Starbucks Corporationobtener los resultados. Document Released: 09/06/2008 Document Revised: 02/20/[facility] Patient Information 2014 [place], [place]. Betamethasone injection Qu es este medicamento? La BETAMETASONA es un corticosteroide. Ayuda a disminuir hinchazones, enrojecimiento, picazn y Therapist, artreacciones alrgicas. Se utiliza para tratar asma, alergias, artritis, enfermedad de Crohn y colitis ulcerosa. Tambin se [place] ([place]) Islandsutiliza para Honeywellotras condiciones, tales como trastornos sanguneos o enfermedades de la glndula suprarrenal. Este medicamento puede ser utilizado para otros usos; si tiene alguna pregunta consulte con su proveedor de atencin mdica o con su farmacutico. MARCAS COMERCIALES DISPONIBLES: [place], [place] Qu le debo informar a mi profesional de la salud antes de tomar este medicamento? Necesita  saber si usted presenta alguno de los siguientes problemas o situaciones: -problemas de coagulacin -Sndrome de [place] -diabetes -problemas cardacos o enfermedad cardiaca -alta presin sangunea -infecciones, tales como varicela, mictica, herpes, sarampin o tuberculosis -enfermedad renal -enfermedad heptica -problemas mentales -miastenia gravis -osteoporosis -convulsiones -enfermedad estomacal, intestinal -una reaccin alrgica o inusual a la betametasona, a los corticosteroides, a otros medicamentos, alimentos, colorantes o conservantes -si est embarazada o buscando quedar embarazada -si est amamantando a un beb Cmo debo utilizar este medicamento? Este medicamento se administra mediante inyeccin por va intramuscular, en una articulacin, lesin u otro tejido. Lo administra un profesional de Radiographer, therapeuticla salud en un hospital o en un entorno clnico. Hable con su pediatra para informarse acerca del uso de este medicamento en nios. Puede requerir atencin especial. Sobredosis: Pngase en contacto inmediatamente con un centro toxicolgico o una sala de urgencia si usted cree que haya tomado demasiado medicamento. ATENCIN: Reynolds AmericanEste medicamento es solo para usted. No comparta este medicamento con nadie. Qu sucede si me olvido de una dosis? No se aplica en este caso. Qu puede interactuar con este medicamento? No tome esta medicina con ninguno de los siguientes medicamentos: -mifepristona Esta medicina tambin puede interactuar con los siguientes medicamentos: -aspirina -vacunas -warfarina Puede ser que esta lista no menciona todas las posibles interacciones. Informe a su profesional de Beazer Homesla salud de Ingram Micro Inctodos los productos a base de hierbas, medicamentos de [place]donventa libre o suplementos nutritivos que est tomando. Si usted fuma, consume bebidas alcohlicas o si utiliza drogas ilegales, indqueselo tambin a su profesional  de [employer]. Algunas sustancias pueden interactuar con su medicamento. A  qu debo estar atento al usar PPL Corporation? Visite a su mdico o a su profesional de la salud para chequear su evolucin peridicamente. Si sus sntomas no comienzan a mejorar o si empeoran, informe a su mdico o su profesional de [employer]. Si toma este medicamento durante un perodo prolongado, lleve consigo una tarjeta de identificacin con su nombre y direccin, el tipo y la dosis del [place], y Administrator, Civil Service y la direccin de su mdico. [place] medicamento puede aumentar su riesgo de contraer una infeccin. Trate de no acercarse a personas que estn enfermas. Informe a su mdico o a su profesional de la salud si est en contacto con personas con sarampin o varicela. Si va a someterse a una operacin, informe a su mdico o a su profesional de la salud que ha tomado este [employer] ltimos doce meses. Consulte a su mdico o a su profesional de la salud acerca de su dieta. Tal vez tendr que reducir la cantidad de sal que consume. Este medicamento puede afectar su nivel de Banker. Si tiene diabetes, consulte a su mdico o a su profesional de la salud antes de cambiar su dieta o la dosis de su medicamento para la diabetes. Es probable que Water engineer ser inmunizada con ciertas vacunas mientras recibe este medicamento. Asegrese de informar a su mdico que est tomando medicamento antes de recibir Passenger transport manager. Qu efectos secundarios puedo tener al [employer] este medicamento? Efectos secundarios que debe informar a su mdico o a Producer, television/film/video de la salud tan pronto como sea posible: -Therapist, art como erupcin cutnea, picazn o urticarias, hinchazn de la cara, labios o lengua -heces de color oscuro o de aspecto alquitranado -problemas respiratorios -ojos hinchados -cambios en la visin -fiebre, dolor de garganta, infeccin, heridas que no cicatrizan -orinar con mayor frecuencia -alta presin sangunea -aumento de la sed -dolor en las caderas, espalda,  costillas, brazos, hombros o piernas -hinchazn de los pies o de la parte inferior de las piernas -cansancio o debilidad inusual Efectos secundarios que, por lo general, no requieren atencin mdica (debe informarlos a su mdico o a su profesional de la salud si persisten o si son molestos): -confusin, excitacin, inquietud -dolor de cabeza -nuseas, vmito -problemas en la piel, acn, piel delgada y brillante -Programme researcher, broadcasting/film/video -dificultad para conciliar el sueo -aumento de peso Puede ser que esta lista no menciona todos los posibles efectos secundarios. Comunquese a su mdico por asesoramiento mdico [employer]lett[employer]ard. Usted puede informar los efectos secundarios a la FDA por telfono al 1-800-FDA-1088. Dnde debo guardar mi medicina? Este medicamento se administra en hospitales o clnicas y no necesitar guardarlo en su domicilio. ATENCIN: Este folleto es un resumen. Puede ser que no cubra toda la posible informacin. Si usted tiene preguntas acerca de esta medicina, consulte con su mdico, su farmacutico o su profesional de Radiographer, therapeutic.  2014, Elsevier/Gold Standard. (2012-09-20 14:48:21)

## 2014-01-10 NOTE — MAU Note (Signed)
Pt states was told to come here for BMZ injections for +FFN, however also is having pain with walking and was unable to get comfortable until 2am. Denies bleeding, does have a small amount of fluid.

## 2014-01-10 NOTE — MAU Provider Note (Signed)
History     CSN: [id number]  Arrival date and time: 01/10/14 1530   None     Chief Complaint  Patient presents with  . betamethasone   . Abdominal Pain   HPI Cathy Nash is a 33 y.o. [id number] at [redacted]w[redacted]d who presented to MAU for 2nd injection of BMZ after +FFN on 1/29.  Patient also c/o headaches x 1 day and intermittent lower abdominal discomfort- none today.  Admits to chills and nausea today.  Has not taken any medications.  Denies fever, dysuria, vomiting, diarrhea, or constipation.  Denies vaginal bleeding, vaginal discharge, contractions, lof.  Reports less FM than a few weeks ago but normal.  Does not know how to do kick counts.    Past Medical History  Diagnosis Date  . Asthma   . Asthma   . Preterm labor     1st pregnancy d/t pre-e  . Pregnancy induced hypertension     previous pregnancy  . Pyelonephritis     June 2013  . Varicose veins     Past Surgical History  Procedure Laterality Date  . No past surgeries      Family History  Problem Relation Age of Onset  . Anesthesia problems Neg Hx   . Hypotension Neg Hx   . Malignant hyperthermia Neg Hx   . Pseudochol deficiency Neg Hx   . Other Neg Hx   . Hypertension Mother   . Asthma Father     History  Substance Use Topics  . Smoking status: Never Smoker   . Smokeless tobacco: Never Used  . Alcohol Use: No     Comment: a little when not pregnant    Allergies:  Allergies  Allergen Reactions  . Iohexol      Code: SOB, Desc: IMMEDIATELY SOB FOLLOWING IV INJECTION, SPOTTY HIVES, PT GIVEN BENADRYL AND EPI-ARS 08/22/09, Onset Date: [id number]     Prescriptions prior to admission  Medication Sig Dispense Refill  . Prenatal Vit-Fe Fumarate-FA (PRENATAL MULTIVITAMIN) TABS Take 1 tablet by mouth daily at 12 noon.        Review of Systems  All other systems reviewed and are negative.   Physical Exam   Blood pressure 102/71, pulse 97, temperature 97.9 F (36.6 C), temperature source Oral, resp.  rate 18, height 5\' 3"  (1.6 m), weight 64.864 kg (143 lb), last menstrual period 04/30/2013.  Physical Exam  Nursing note and vitals reviewed. Constitutional: She is oriented to person, place, and time. She appears well-developed and well-nourished. No distress.  HENT:  Head: Normocephalic and atraumatic.  Cardiovascular: Normal rate.   Respiratory: Effort normal. No respiratory distress.  GI: She exhibits no distension and no mass. There is tenderness (along inguinal region bilaterally). There is no rebound and no guarding.  Gravid.   Musculoskeletal: She exhibits no edema.  Neurological: She is alert and oriented to person, place, and time.  Skin: Skin is warm and dry. No rash noted. She is not diaphoretic.  Psychiatric: She has a normal mood and affect. Her behavior is normal.   Dilation: 1 Effacement (%): 50 Station: -2 Exam by:: Dr. Reola Calkins  FHT:  140 bpm; moderate variability; accels present; decels absent  MAU Course  Procedures  MDM Results for orders placed during the hospital encounter of 01/10/14 (from the past 24 hour(s))  URINALYSIS, ROUTINE W REFLEX MICROSCOPIC     Status: Abnormal   Collection Time    01/10/14  3:50 PM      Result Value Range  Color, Urine YELLOW  YELLOW   APPearance CLEAR  CLEAR   Specific Gravity, Urine >1.030 (*) 1.005 - 1.030   pH 6.0  5.0 - 8.0   Glucose, UA NEGATIVE  NEGATIVE mg/dL   Hgb urine dipstick SMALL (*) NEGATIVE   Bilirubin Urine NEGATIVE  NEGATIVE   Ketones, ur 15 (*) NEGATIVE mg/dL   Protein, ur NEGATIVE  NEGATIVE mg/dL   Urobilinogen, UA 0.2  0.0 - 1.0 mg/dL   Nitrite NEGATIVE  NEGATIVE   Leukocytes, UA NEGATIVE  NEGATIVE  URINE MICROSCOPIC-ADD ON     Status: Abnormal   Collection Time    01/10/14  3:50 PM      Result Value Range   Squamous Epithelial / LPF FEW (*) RARE   RBC / HPF 0-2  <3 RBC/hpf    Assessment and Plan  #32 y.o. female [id number] 7173w3d #Threatened PTL #Headache  - Administer 2nd injection of BMZ -  Hx of pre-eclampsia; normotensive, UA neg for protein; patient is stable; continue to monitor - Recommended increasing oral hydration and taking Tylenol for pain control - Strict PTL precautions given  TUCKER, BRITTON L 01/10/2014, 5:06 PM    I have seen and examined this patient and agree with above documentation in the PA student's note.   33 yo G3P1102 @ 5473w3d here for repeat BMZ. Told triage she was having abdominal pain yesterday but gone today so was brought back. Cervix 1/50/-3 - relatively unchanged from yesterday.  +FFN in clinic.  No ctx on TOCO. BMZ #2 given. PTL precautions discussed.  Of note, no hx of PTL but did have a PTD due to IOL for Pre-E.   FWB- cat I tracing.   Rulon AbideKeli Deema Juncaj, M.D. [facility] 01/10/2014 6:22 PM

## 2014-01-14 ENCOUNTER — Encounter (HOSPITAL_COMMUNITY): Payer: Self-pay | Admitting: *Deleted

## 2014-01-14 ENCOUNTER — Inpatient Hospital Stay (HOSPITAL_COMMUNITY)
Admission: AD | Admit: 2014-01-14 | Discharge: 2014-01-14 | Disposition: A | Discharge status: Home / Self Care | Payer: Medicaid Other | Source: Ambulatory Visit | Attending: Obstetrics & Gynecology | Admitting: Obstetrics & Gynecology

## 2014-01-14 DIAGNOSIS — O09299 Supervision of pregnancy with other poor reproductive or obstetric history, unspecified trimester: Secondary | ICD-10-CM

## 2014-01-14 DIAGNOSIS — R109 Unspecified abdominal pain: Secondary | ICD-10-CM | POA: Insufficient documentation

## 2014-01-14 DIAGNOSIS — M545 Low back pain, unspecified: Secondary | ICD-10-CM | POA: Insufficient documentation

## 2014-01-14 DIAGNOSIS — O09219 Supervision of pregnancy with history of pre-term labor, unspecified trimester: Secondary | ICD-10-CM

## 2014-01-14 DIAGNOSIS — O4700 False labor before 37 completed weeks of gestation, unspecified trimester: Secondary | ICD-10-CM

## 2014-01-14 DIAGNOSIS — R3129 Other microscopic hematuria: Secondary | ICD-10-CM | POA: Insufficient documentation

## 2014-01-14 DIAGNOSIS — O0990 Supervision of high risk pregnancy, unspecified, unspecified trimester: Secondary | ICD-10-CM

## 2014-01-14 DIAGNOSIS — O09899 Supervision of other high risk pregnancies, unspecified trimester: Secondary | ICD-10-CM

## 2014-01-14 DIAGNOSIS — K644 Residual hemorrhoidal skin tags: Secondary | ICD-10-CM | POA: Insufficient documentation

## 2014-01-14 LAB — URINALYSIS, ROUTINE W REFLEX MICROSCOPIC
Bilirubin Urine: NEGATIVE
Glucose, UA: NEGATIVE mg/dL
KETONES UR: 15 mg/dL — AB
LEUKOCYTES UA: NEGATIVE
Nitrite: NEGATIVE
PH: 5.5 (ref 5.0–8.0)
Protein, ur: NEGATIVE mg/dL
Specific Gravity, Urine: 1.02 (ref 1.005–1.030)
Urobilinogen, UA: 0.2 mg/dL (ref 0.0–1.0)

## 2014-01-14 LAB — WET PREP, GENITAL
Clue Cells Wet Prep HPF POC: NONE SEEN
Trich, Wet Prep: NONE SEEN
YEAST WET PREP: NONE SEEN

## 2014-01-14 LAB — URINE MICROSCOPIC-ADD ON

## 2014-01-14 MED ORDER — HYDROCORTISONE ACETATE 25 MG RE SUPP
25.0000 mg | Freq: Once | RECTAL | Status: AC
  Administered 2014-01-14: 25 mg via RECTAL
  Filled 2014-01-14: qty 1

## 2014-01-14 MED ORDER — ONDANSETRON HCL 4 MG PO TABS
8.0000 mg | ORAL_TABLET | Freq: Once | ORAL | Status: AC
  Administered 2014-01-14: 8 mg via ORAL
  Filled 2014-01-14: qty 2

## 2014-01-14 MED ORDER — HYDROCORTISONE ACETATE 25 MG RE SUPP: 25.0000 mg | Freq: Two times a day (BID) | RECTAL | Status: DC

## 2014-01-14 MED ORDER — OXYCODONE-ACETAMINOPHEN 5-325 MG PO TABS
2.0000 | ORAL_TABLET | Freq: Once | ORAL | Status: AC
  Administered 2014-01-14: 2 via ORAL
  Filled 2014-01-14: qty 2

## 2014-01-14 NOTE — MAU Note (Signed)
PT SAYS X3 DAYS HAS HAD  INCREASE MUCUS.    GETS PNC  DOWNSTAIRS.   VE 1 WEEKS-   2 CM .  SAYS HER BACK STARTED  HURTING AT 6 PM - NO MED.    DENIES HSV AND MRSA.

## 2014-01-14 NOTE — MAU Provider Note (Signed)
History     CSN: [id number]  Arrival date and time: 01/14/14 2004   First Provider Initiated Contact with Patient 01/14/14 2145      Chief Complaint  Patient presents with  . Abdominal Pain  . Hemorrhoids   HPI [age] [id number] presents for hemorrhoid pain, low back pain.  Also complains of increased discharge and feeling lower abdominal pressure.  Reports good FM. No VB.   No fevers.   She is passing stool - last night was last BM. It was normal consistency, not hard.  No recent diarrhea.  She has not taken any oral medication for her hemorrhoidal pain, but has used vicks vapor rub on the area, which helps slightly.    New onset nausea here in the MAU after cervical check.  Has been tolerating PO at home.  No difficulty or pain urinating.  Past Medical History  Diagnosis Date  . Asthma   . Asthma   . Preterm labor     1st pregnancy d/t pre-e  . Pregnancy induced hypertension     previous pregnancy  . Pyelonephritis     June 2013  . Varicose veins     Past Surgical History  Procedure Laterality Date  . No past surgeries      Family History  Problem Relation Age of Onset  . Anesthesia problems Neg Hx   . Hypotension Neg Hx   . Malignant hyperthermia Neg Hx   . Pseudochol deficiency Neg Hx   . Other Neg Hx   . Hypertension Mother   . Asthma Father     History  Substance Use Topics  . Smoking status: Never Smoker   . Smokeless tobacco: Never Used  . Alcohol Use: No     Comment: a little when not pregnant    Allergies:  Allergies  Allergen Reactions  . Iohexol Hives and Shortness Of Breath     Code: SOB, Desc: IMMEDIATELY SOB FOLLOWING IV INJECTION, SPOTTY HIVES, PT GIVEN BENADRYL AND EPI-ARS 08/22/09, Onset Date: [id number]     Prescriptions prior to admission  Medication Sig Dispense Refill  . Prenatal Vit-Fe Fumarate-FA (PRENATAL MULTIVITAMIN) TABS Take 1 tablet by mouth daily at 12 noon.        ROS See HPI Physical Exam   Blood pressure 126/76, pulse  100, temperature 98.6 F (37 C), temperature source Oral, resp. rate 18, height 5\' 2"  (1.575 m), weight 65.431 kg (144 lb 4 oz), last menstrual period 04/30/2013, SpO2 97.00%.  Physical Exam  Constitutional: She is oriented to person, place, and time. She appears well-developed and well-nourished.  HENT:  Head: Normocephalic and atraumatic.  Eyes: Conjunctivae are normal.  Cardiovascular: Intact distal pulses.   Respiratory: Effort normal. No respiratory distress.  GI: Soft.  Appropriately gravid uterus. Nontender.   Genitourinary: Vagina normal and uterus normal.  No CVA tenderness.  Tender, external hemorrhoids on exam  Neurological: She is alert and oriented to person, place, and time.  No focal deficits  Skin: Skin is warm and dry.  Psychiatric: She has a normal mood and affect. Her behavior is normal. Thought content normal.    FWB: Cat 1.  Baseline 135. Moderate variability.  Accels present. Decels Absent. No UC's.  (Mom moving during monitoring secondary to hemorrhoidal pain.)  MAU Course  Procedures  - Fern test neg - Wet prep neg - SSE - no pooling. Physiologic discharge - Cervical exam Dilation: 1 Effacement (%): 50 Cervical Position: Posterior Station: -3 Exam by:: DR Christena DeemFENNY  Assessment and Plan    # Hemorrhoids - Anusol, HC supp. Rx - Percocet here for pain.  Recommend tylenol at home - Recommend miralax at home - f/u next prenatal clinic visit  # Third trimester pregnancy, threatened PTL - recent FFN+, received BMZx2 prior to this visit. - Not currently in labor. Fern neg. No pooling. No contractions. - FWB Cat 1  # Microscopic hematuria - present on prior UA's. UA today otherwise negative. No CVA tenderness. - h/o pyelonephritis (w subsequent threatened PTL at 32 wks prior pregnancy- 2013). defer workup outpatient.   # Discharge Home  Quincy Simmonds 01/14/2014, 11:28 PM   I was present for the exam and agree with above.  Carlos,  CNM 01/15/2014 9:30 AM

## 2014-01-14 NOTE — Discharge Instructions (Signed)
Hemorroides   (Hemorrhoids)  Las hemorroides son venas inflamadas alrededor del recto o del ano. Hay dos tipos de hemorroides:   · Hemorroides internas. Se forman en las venas del interior del recto. Pueden abultarse hacia el exterior e irritarse y doler.  · Hemorroides externas. Se producen en las venas externas al ano y pueden sentirse como un bulto o zona hinchada dura y dolorosa cerca del ano.  CAUSAS   · Embarazo.    · Obesidad.    · Constipación o diarrea.    · Dificultad para mover el intestino.    · Permanecer sentado durante largos períodos en el inodoro.  · Levantar objetos pesados u otras actividades que impliquen esfuerzo.  · Sexo anal.  SÍNTOMAS   · Dolor.    · Picazón o irritación anal.    · Sangrado rectal.    · Pérdida fecal.    · Inflamación anal.    · Uno o más bultos en la zona anal.    DIAGNÓSTICO   El médico puede diagnosticar las hemorroides mediante un examen visual. Otros estudios o análisis que se pueden realizar son:   · Examen de la zona rectal con una mano enguantada (examen digital rectal).    · Examen del canal anal utilizando un pequeño tubo (endoscopio).    · Análisis de sangre si ha perdido una cantidad significativa de sangre.  · Un estudio para observar el interior del colon (sigmoideoscopía o colonoscopía).  TRATAMIENTO   La mayoría de las hemorroides pueden tratarse en casa. Sin embargo, si los síntomas no mejoran o tiene sangrado rectal, el médico puede realizar un procedimiento para disminuir las hemorroides o extirparlas completamente. Los tratamientos posibles son:   · Colocación de una banda de goma en la base de la hemorroide para cortar la circulación (ligadura con banda elástica).    · Inyección de una sustancia química para achicar las hemorroides (escleroterapia).    · Utilización de un instrumento para quemar las hemorroides (terapia con luz infrarroja).    · Extirpación quirúrgica de la hemorroides (hemorroidectomía).    · Colocación de grapas en la hemorroides para  bloquear el flujo de sangre a los tejidos (engrapado de hemorroides).    INSTRUCCIONES PARA EL CUIDADO EN EL HOGAR   · Consuma alimentos con fibra, como cereales integrales, legumbres, frutos secos, frutas y verduras. Pregúntele a su médico acerca de tomar productos con fibra añadida en ellos (suplementos de fibra).  · Aumente la ingestión de líquidos. Beba gran cantidad de líquido para mantener la orina de tono claro o color amarillo pálido.    · Haga ejercicios regularmente.    · Vaya al baño cuando sienta la necesidad de mover el intestino. No espere.    · Evite hacer fuerza al mover el intestino.    · Mantenga la zona anal limpia y seca. Use papel higiénico húmedo o toallitas humedecidas después de mover el intestino.    · Puede usar o aplicar según las indicaciones algunas cremas especiales y supositorios.    · Tome sólo medicamentos de venta libre o recetados, según las indicaciones del médico.    · Tome baños de asiento tibios durante 15-20 minutos, 3-4 veces por día para calmar el dolor y las molestias.    · Coloque una bolsa de hielo sobre las hemorroides si le duelen o se hinchan. Usar compresas de hielo entre los baños de asiento puede ser efectivo.    · Ponga el hielo en una bolsa plástica.    · Colóquese una toalla entre la piel y la bolsa de hielo.    · Deje el hielo durante 15 - 20 minutos y aplíquelo 3 - 4   puede controlarlo con la medicacin o con un tratamiento.  Tiene un sangrado que no Magazine features editor.  No puede mover el intestino.  Siente dolor o tiene inflamacin fuera de la zona de las hemorroides. ASEGRESE DE QUE:   Comprende estas instrucciones.  Controlar su enfermedad.  Recibir ayuda de inmediato si no mejora o si empeora. Document  Released: 11/28/2005 Document Revised: 07/31/2013 [facility] Patient Information 2014 [place], [place].     Tercer trimestre del Psychiatrist  (Third Trimester of Pregnancy) El tercer trimestre del embarazo abarca desde la semana 29 hasta la semana 42, desde el 7 mes hasta el 9. En este trimestre el feto se desarrolla muy rpidamente. Hacia el final del noveno mes, el beb que an no ha nacido mide alrededor de 20 pulgadas (45 cm) de largo y pesa entre 6 y 10 libras (2,700 y 4,500 kg).  CAMBIOS CORPORALES  Su organismo atravesar numerosos cambios durante el [place]. Los cambios varan de una mujer a Educational psychologist.   Seguir [employer]. Es esperable que aumente entre 25 y 35 libras (11 16 kg) hacia el final del [place].  Podrn aparecer las primeras estras en las caderas, abdomen y [place].  Tendr necesidad de Geographical information systems officer con ms frecuencia porque el feto baja hacia la pelvis y presiona en la vejiga.  Como consecuencia del Psychiatrist, podr sentir Actor.  Podr estar constipada ya que ciertas hormonas hacen que los msculos que hacen progresar los desechos a travs de los intestinos trabajen ms lentamente.  Pueden aparecer hemorroides o abultarse e hincharse las venas (venas varicosas).  Podr sentir dolor plvico debido al [employer] de peso ya que las hormonas del embarazo relajan las articulaciones entre los huesos de la pelvis. El dolor de espalda puede ser consecuencia de la exigencia de los msculos que soportan la [place].  Sus mamas seguirn desarrollndose y estarn ms sensibles. A veces sale Veterinary surgeon de las [place], que se llama Product manager.  El ombligo puede salir hacia afuera.  Podr sentir [employer] falta el aire debido a que se expande el tero.  Podr notar que el feto "baja" o que se siente ms bajo en el abdomen.  Podr tener una prdida de secrecin mucosa con sangre. Esto suele ocurrir [employer] unos [address] y Neomia Dear semana antes del [place].  El  cuello se vuelve delgado y blando (se borra) cerca de la fecha de [place]. QU DEBE ESPERAR EN LAS CONSULTAS PRENATALES  Le harn exmenes prenatales cada 2 semanas hasta la semana 36. A partir de ese momento le harn exmenes semanales. Durante una visita prenatal de rutina:   La pesarn para verificar que usted y el feto se encuentran dentro de los lmites normales.  Le tomarn la presin arterial.  Le medirn el abdomen para verificar el desarrollo del beb.  Escucharn los latidos fetales.  Se evaluarn los resultados de los estudios solicitados en visitas anteriores.  Le controlarn el cuello del tero cuando est prxima la fecha de parto para ver si se ha borrado. Alrededor de la semana 36 el mdico controlar el cuello del tero. Al mismo tiempo realizar un anlisis de las secreciones del tejido vaginal. Este examen es para determinar si hay un tipo de bacteria, estreptococo Grupo B. El mdico le explicar esto con ms detalle.  El mdico podr preguntarle:   DIRECTV gustara que fuera el [place].  Cmo se siente.  Si siente los movimientos del beb.  Si tiene sntomas anormales, como prdida de lquido, [place], [employer]  de cabeza intenso o clicos abdominales.  Si tiene [place] duda. Otros estudios que podrn realizarse durante el tercer trimestre son:   Anlisis de sangre para controlar sus niveles de hierro (anemia).  Controles fetales para determinar su salud, el nivel de [place] y su desarrollo. Si tiene [place] enfermedad o si tuvo problemas durante el [place], le harn estudios. FALSO TRABAJO DE PARTO  Es posible que sienta contracciones pequeas e irregulares que finalmente desaparecen. Se llaman contracciones de [address] o falso trabajo de [place]. Las contracciones pueden durar horas, [address]  [address] o an Retail buyer antes de que el verdadero trabajo de parto se inicie. Si las contracciones tienen intervalos regulares, se intensifican o se hacen dolorosas, lo mejor es que la  revise su mdico.  SIGNOS DE TRABAJO DE PARTO   Espasmos del tipo menstrual.  Contracciones cada 5 minutos o menos.  Contracciones que comienzan en la parte superior del tero y se expanden hacia abajo, a la zona inferior del abdomen y la espalda.  Sensacin de presin que aumenta en la pelvis o dolor en la espalda.  Aparece una secrecin acuosa o sanguinolenta por la vagina. Si tiene alguno de estos signos antes de la semana 37 del embarazo, llame a su mdico inmediatamente. Debe concurrir al hospital para ser controlada inmediatamente.  INSTRUCCIONES PARA EL CUIDADO EN EL HOGAR   Evite fumar, consumir hierbas, beber alcohol y Chemical engineer frmacos que no le hayan recetado. Estas sustancias qumicas afectan la formacin y el desarrollo del beb.  Siga las indicaciones del profesional con respecto a Adult nurse. Durante el embarazo, hay medicamentos que son seguros y otros no lo son.  Realice actividad fsica slo segn las indicaciones del mdico. Sentir clicos uterinos es el mejor signo para Restaurant manager, fast food actividad fsica.  Contine haciendo comidas regulares y sanas.  Use un sostn que le brinde buen soporte si sus mamas estn sensibles.  No utilice la baera con agua caliente, baos turcos o saunas.  Colquese el cinturn de seguridad cuando conduzca.  Evite comer carne cruda queso sin cocinar y el contacto con los utensilios y desperdicios de los gatos. Estos elementos contienen grmenes que pueden causar defectos de nacimiento en el beb.  Tome las vitaminas indicadas para la etapa prenatal.  Pruebe un laxante (si el mdico la autoriza) si tiene constipacin. Consuma ms alimentos ricos en fibra, como vegetales y frutas frescos y cereales enteros. Beba gran cantidad de lquido para mantener la orina de tono claro o amarillo plido.  Tome baos de agua tibia para Primary school teacher o las molestias causadas por las hemorroides. Use una crema para las hemorroides si el  mdico la autoriza.  Si tiene venas varicosas, use medias de soporte. Eleve los pies durante 15 minutos, 3 4 veces por da. Limite el consumo de sal en su dieta.  Evite levantar objetos pesados, use zapatos de tacones bajos y [place].  Descanse con las piernas elevadas si tiene calambres o dolor de cintura.  Visite a su dentista si no lo ha Occupational hygienist. Use un cepillo de dientes blando para higienizarse los dientes y use suavemente el hilo dental.  Puede continuar su vida sexual excepto que el mdico le indique otra cosa.  No haga viajes largos excepto que sea absolutamente necesario y slo con la aprobacin de su mdico.  Tome clases prenatales para entender, Education administrator y hacer preguntas sobre el [place] de parto y el alumbramiento.  Haga un ensayo sobre la partida al hospital.  Prepare el bolso que llevar al hospital.  Prepare la habitacin del beb.  Contine concurriendo a todas las visitas prenatales segn las indicaciones de su mdico. SOLICITE ATENCIN MDICA SI:   No est segura si est en trabajo de parto o ha roto la bolsa de aguas.  Tiene mareos.  Siente clicos leves, presin en la pelvis o dolor persistente en el abdomen.  Tiene nuseas o vmitos persistentes.  Brett Fairybserva una secrecin vaginal con mal olor.  Siente dolor al ConocoPhillipsorinar. SOLICITE ATENCIN MDICA DE INMEDIATO SI:   Tiene fiebre.  Pierde lquido o sangre por la vagina.  Tiene sangrado o pequeas prdidas vaginales.  Siente dolor intenso o clicos en el abdomen.  Sube o baja de peso rpidamente.  Le falta el aire y le duele el pecho al respirar.  Sbitamente se le hincha el rostro, las manos, los tobillos, los pies o las piernas de [place] extrema.  No ha sentido los movimientos del beb durante Georgianne Fickuna hora.  Siente un dolor de cabeza intenso que no se alivia con medicamentos.  Su visin se modifica. Document Released: 09/07/2005 Document Revised:  07/31/2013 [facility] Patient Information 2014 [place], [place].

## 2014-01-15 NOTE — MAU Provider Note (Signed)
Attestation of Attending Supervision of Fellow: Evaluation and management procedures were performed by the Fellow under my supervision and collaboration.  I have reviewed the Fellow's note and chart, and I agree with the management and plan.    

## 2014-01-16 NOTE — MAU Provider Note (Signed)
Attestation of Attending Supervision of Advanced Practitioner (PA/CNM/NP): Evaluation and management procedures were performed by the Advanced Practitioner under my supervision and collaboration.  I have reviewed the Advanced Practitioner's note and chart, and I agree with the management and plan.  Trisha Ken, MD, FACOG Attending Obstetrician & Gynecologist Faculty Practice, [facility]  

## 2014-01-23 ENCOUNTER — Encounter: Payer: Self-pay | Admitting: Obstetrics and Gynecology

## 2014-01-26 ENCOUNTER — Encounter (HOSPITAL_COMMUNITY): Payer: Self-pay | Admitting: Emergency Medicine

## 2014-01-26 ENCOUNTER — Inpatient Hospital Stay (HOSPITAL_COMMUNITY)
Admission: AD | Admit: 2014-01-26 | Discharge: 2014-01-26 | Disposition: A | Discharge status: Other Acute Inpatient Hospital | Payer: Self-pay | Attending: Emergency Medicine | Admitting: Emergency Medicine

## 2014-01-26 DIAGNOSIS — R1084 Generalized abdominal pain: Secondary | ICD-10-CM | POA: Insufficient documentation

## 2014-01-26 DIAGNOSIS — J45909 Unspecified asthma, uncomplicated: Secondary | ICD-10-CM | POA: Insufficient documentation

## 2014-01-26 DIAGNOSIS — O200 Threatened abortion: Secondary | ICD-10-CM | POA: Insufficient documentation

## 2014-01-26 DIAGNOSIS — M545 Low back pain, unspecified: Secondary | ICD-10-CM | POA: Insufficient documentation

## 2014-01-26 DIAGNOSIS — O99891 Other specified diseases and conditions complicating pregnancy: Secondary | ICD-10-CM

## 2014-01-26 DIAGNOSIS — R509 Fever, unspecified: Secondary | ICD-10-CM | POA: Insufficient documentation

## 2014-01-26 DIAGNOSIS — O9989 Other specified diseases and conditions complicating pregnancy, childbirth and the puerperium: Secondary | ICD-10-CM | POA: Insufficient documentation

## 2014-01-26 DIAGNOSIS — J3489 Other specified disorders of nose and nasal sinuses: Secondary | ICD-10-CM | POA: Insufficient documentation

## 2014-01-26 DIAGNOSIS — J018 Other acute sinusitis: Secondary | ICD-10-CM

## 2014-01-26 DIAGNOSIS — O4700 False labor before 37 completed weeks of gestation, unspecified trimester: Secondary | ICD-10-CM

## 2014-01-26 DIAGNOSIS — R300 Dysuria: Secondary | ICD-10-CM | POA: Insufficient documentation

## 2014-01-26 DIAGNOSIS — R5383 Other fatigue: Secondary | ICD-10-CM

## 2014-01-26 DIAGNOSIS — R5381 Other malaise: Secondary | ICD-10-CM | POA: Insufficient documentation

## 2014-01-26 DIAGNOSIS — N949 Unspecified condition associated with female genital organs and menstrual cycle: Secondary | ICD-10-CM | POA: Insufficient documentation

## 2014-01-26 DIAGNOSIS — R000 Tachycardia, unspecified: Secondary | ICD-10-CM | POA: Insufficient documentation

## 2014-01-26 DIAGNOSIS — Z87448 Personal history of other diseases of urinary system: Secondary | ICD-10-CM | POA: Insufficient documentation

## 2014-01-26 DIAGNOSIS — Z8679 Personal history of other diseases of the circulatory system: Secondary | ICD-10-CM | POA: Insufficient documentation

## 2014-01-26 DIAGNOSIS — N898 Other specified noninflammatory disorders of vagina: Secondary | ICD-10-CM | POA: Insufficient documentation

## 2014-01-26 DIAGNOSIS — R51 Headache: Secondary | ICD-10-CM | POA: Insufficient documentation

## 2014-01-26 LAB — URINALYSIS, ROUTINE W REFLEX MICROSCOPIC
Bilirubin Urine: NEGATIVE
GLUCOSE, UA: NEGATIVE mg/dL
Ketones, ur: NEGATIVE mg/dL
Nitrite: NEGATIVE
Protein, ur: NEGATIVE mg/dL
SPECIFIC GRAVITY, URINE: 1.009 (ref 1.005–1.030)
Urobilinogen, UA: 0.2 mg/dL (ref 0.0–1.0)
pH: 7 (ref 5.0–8.0)

## 2014-01-26 LAB — CBC
HEMATOCRIT: 30.9 % — AB (ref 36.0–46.0)
HEMOGLOBIN: 10.7 g/dL — AB (ref 12.0–15.0)
MCH: 30.3 pg (ref 26.0–34.0)
MCHC: 34.6 g/dL (ref 30.0–36.0)
MCV: 87.5 fL (ref 78.0–100.0)
Platelets: 293 10*3/uL (ref 150–400)
RBC: 3.53 MIL/uL — ABNORMAL LOW (ref 3.87–5.11)
RDW: 12.9 % (ref 11.5–15.5)
WBC: 6.9 10*3/uL (ref 4.0–10.5)

## 2014-01-26 LAB — BASIC METABOLIC PANEL
BUN: 5 mg/dL — ABNORMAL LOW (ref 6–23)
CO2: 21 meq/L (ref 19–32)
Calcium: 8.1 mg/dL — ABNORMAL LOW (ref 8.4–10.5)
Chloride: 99 mEq/L (ref 96–112)
Creatinine, Ser: 0.43 mg/dL — ABNORMAL LOW (ref 0.50–1.10)
GFR calc non Af Amer: >90 mL/min (ref >90)
Glucose, Bld: 107 mg/dL — ABNORMAL HIGH (ref 70–99)
POTASSIUM: 3.6 meq/L — AB (ref 3.7–5.3)
Sodium: 135 mEq/L — ABNORMAL LOW (ref 137–147)

## 2014-01-26 LAB — HEPATIC FUNCTION PANEL
ALBUMIN: 2.9 g/dL — AB (ref 3.5–5.2)
ALK PHOS: 144 U/L — AB (ref 39–117)
ALT: 20 U/L (ref 0–35)
AST: 34 U/L (ref 0–37)
BILIRUBIN TOTAL: 0.3 mg/dL (ref 0.3–1.2)
Bilirubin, Direct: <0.2 mg/dL (ref 0.0–0.3)
Total Protein: 7.1 g/dL (ref 6.0–8.3)

## 2014-01-26 LAB — URINE MICROSCOPIC-ADD ON

## 2014-01-26 MED ORDER — AMOXICILLIN-POT CLAVULANATE 875-125 MG PO TABS: 1.0000 | ORAL_TABLET | Freq: Two times a day (BID) | ORAL | Status: DC

## 2014-01-26 MED ORDER — OXYCODONE-ACETAMINOPHEN 5-325 MG PO TABS: 2.0000 | ORAL_TABLET | Freq: Four times a day (QID) | ORAL | Status: DC | PRN

## 2014-01-26 MED ORDER — OXYCODONE-ACETAMINOPHEN 5-325 MG PO TABS
2.0000 | ORAL_TABLET | Freq: Four times a day (QID) | ORAL | Status: DC | PRN
  Administered 2014-01-26: 2 via ORAL
  Filled 2014-01-26: qty 2

## 2014-01-26 MED ORDER — AMOXICILLIN-POT CLAVULANATE 875-125 MG PO TABS
1.0000 | ORAL_TABLET | Freq: Two times a day (BID) | ORAL | Status: DC
  Administered 2014-01-26: 1 via ORAL
  Filled 2014-01-26: qty 1

## 2014-01-26 MED ORDER — SODIUM CHLORIDE 0.9 % IV BOLUS (SEPSIS): 1000.0000 mL | Freq: Once | INTRAVENOUS | Status: DC

## 2014-01-26 MED ORDER — LACTATED RINGERS IV BOLUS (SEPSIS)
1000.0000 mL | Freq: Once | INTRAVENOUS | Status: AC
  Administered 2014-01-26: 1000 mL via INTRAVENOUS

## 2014-01-26 NOTE — Progress Notes (Signed)
Spoke with Cathy Nash and updated her on the pt. Told her that the pt voided and the urine was sent to the lab. Pt now has a temp of 100.

## 2014-01-26 NOTE — ED Notes (Signed)
OB rapid response at bedside 

## 2014-01-26 NOTE — Progress Notes (Signed)
Pt void[facility] on the bedpan [facility] Urine sent to lab.

## 2014-01-26 NOTE — Progress Notes (Signed)
Updated Dr. Jolayne Pantheronstant. Told her that the pt voided on the bedpan and that urine was sent to the lab. Also the pt has a temp of 100 at this time.

## 2014-01-26 NOTE — Progress Notes (Signed)
Report given to Ginger Cornelius MorasMorris RN, charge nurse MAU. Pt is 335/7 weeks with hx of PTL, c/o lower abd and back pain. No uc's seen Fhr baseline 135bpm, mod variability, accels, no decels. Cervix is 1-2cm, 60%, -1 which is a change from a previous exam of 1cm,50%, -3. On 01/14/14. No vaginal bleeding. Pt is having a white d/c and there is a wet spot on her bed under her bottom.

## 2014-01-26 NOTE — Progress Notes (Addendum)
Spoke with Dr. Constant and told her thatJolayne Nash the pt is aG3P2 with C/O lower abd pain and lower back pain. Pt also has a cough and says that she had a fever last night. Pt says that she did not take her temp, but took 2 tylenol and took a shwer. Pt is afebrile at this time. Pt was tx for a UTI 2 weeks ago and has a hx of preterm deliveries. She also had gestational hypertension with a previous pregnancy. Pt had a pos FFN and received steroids 2 weeks ago. Cervix is 10-2 cm, 60%, -1 station. No vaginal bleeding. Pt does have some white D/C and there is a wet spot underneathe her bottom. [facility]. Fhr baseline is 135bpm, mod variability, accels, no decels.

## 2014-01-26 NOTE — ED Notes (Signed)
Pt from home c/o low abdominal pain, low back pain, and vaginal pain. Pt is [redacted] weeks pregnant. No contractions. She states the doctors told her on her last appointment they would expect the delivery in about 3 weeks. Hx of preterm labor. This is her third pregnancy.

## 2014-01-26 NOTE — ED Notes (Signed)
[facility] transported pt before transport form could be signed

## 2014-01-26 NOTE — MAU Provider Note (Signed)
History     CSN: [id number]  Arrival date and time: 01/26/14 4098   None     Chief Complaint  Patient presents with  . Back Pain  . Vaginal Pain   HPI Comments: Also c/o fever,aches, head congestion with greenish discharge, teeth hurting. For several days.  Back Pain This is a chronic problem. The current episode started in the past 7 days. The problem occurs constantly. The problem is unchanged. The pain is present in the lumbar spine. The quality of the pain is described as aching. The pain does not radiate. The pain is at a severity of 6/10. The pain is moderate. The pain is worse during the day. Stiffness is present all day. Associated symptoms include a fever and headaches.  Vaginal Pain Associated symptoms include back pain, a fever and headaches.    OB History   Grav Para Term Preterm Abortions TAB SAB Ect Mult Living   3 2 1 1  0 0 0 0 0 2      Past Medical History  Diagnosis Date  . Asthma   . Asthma   . Preterm labor     1st pregnancy d/t pre-e  . Pregnancy induced hypertension     previous pregnancy  . Pyelonephritis     June 2013  . Varicose veins     Past Surgical History  Procedure Laterality Date  . No past surgeries      Family History  Problem Relation Age of Onset  . Anesthesia problems Neg Hx   . Hypotension Neg Hx   . Malignant hyperthermia Neg Hx   . Pseudochol deficiency Neg Hx   . Other Neg Hx   . Hypertension Mother   . Asthma Father     History  Substance Use Topics  . Smoking status: Never Smoker   . Smokeless tobacco: Never Used  . Alcohol Use: No     Comment: a little when not pregnant    Allergies:  Allergies  Allergen Reactions  . Iohexol Hives and Shortness Of Breath     Code: SOB, Desc: IMMEDIATELY SOB FOLLOWING IV INJECTION, SPOTTY HIVES, PT GIVEN BENADRYL AND EPI-ARS 08/22/09, Onset Date: [id number]     Prescriptions prior to admission  Medication Sig Dispense Refill  . acetaminophen (TYLENOL) 325 MG tablet Take  650 mg by mouth every 6 (six) hours as needed for mild pain, fever or headache.      . hydrocortisone (ANUSOL-HC) 25 MG suppository Place 25 mg rectally 2 (two) times daily as needed for hemorrhoids or itching.      . Prenatal Vit-Fe Fumarate-FA (PRENATAL MULTIVITAMIN) TABS Take 1 tablet by mouth daily at 12 noon.        Review of Systems  Constitutional: Positive for fever and malaise/fatigue.  HENT: Positive for congestion.   Eyes: Negative.   Respiratory: Negative.   Cardiovascular: Negative.   Gastrointestinal: Negative.   Genitourinary: Negative.   Musculoskeletal: Positive for back pain.  Skin: Negative.   Neurological: Positive for headaches.  Endo/Heme/Allergies: Negative.   Psychiatric/Behavioral: Negative.    Physical Exam   Blood pressure 114/62, pulse 107, temperature 98.5 F (36.9 C), temperature source Oral, resp. rate 14, last menstrual period 04/30/2013, SpO2 97.00%.  Physical Exam  Constitutional: She is oriented to person, place, and time. She appears well-developed and well-nourished.  HENT:  Head: Normocephalic.  Neck: Normal range of motion.  Cardiovascular: Normal rate, regular rhythm, normal heart sounds and intact distal pulses.   Respiratory: Effort normal and  breath sounds normal.  GI: Soft. Bowel sounds are normal.  Musculoskeletal: Normal range of motion.  Neurological: She is alert and oriented to person, place, and time. She has normal reflexes.  Skin: Skin is warm and dry.  Psychiatric: She has a normal mood and affect. Her behavior is normal. Judgment and thought content normal.    MAU Course  Procedures  MDM Sinus infection  Assessment and Plan  Sinus infection at 33 wks.  Wyvonnia DuskyLAWSON, Kaiana Marion DARLENE 01/26/2014, 11:56 AM

## 2014-01-26 NOTE — Progress Notes (Addendum)
Pt c/o headache, cough that started last night. Denies blurred vision, spots before her eyes at this time. Pt has a previuos hx of gestational hypertension . She has a hx of preterm delivieries. ED MD said that he read in the notes that 2 weeks ago the pt had a pos FFN and that she may have received steroids. Pt is complaining of lower back pain and lower abd pain.Pt also was tx for a UTI acouple of weeks ago. A cath urine was ordered by the ED MD, but pt refused the cath and has been unable to void on the bedpan.

## 2014-01-26 NOTE — ED Provider Notes (Signed)
CSN: [id number]     Arrival date & time 01/26/14  [id number] History   First MD Initiated Contact with Patient 01/26/14 [phone]     Chief Complaint  Patient presents with  . Back Pain  . Vaginal Pain     (Consider location/radiation/quality/duration/timing/severity/associated sxs/prior Treatment) Patient is a 33 y.o. female presenting with back pain and vaginal pain. The history is provided by the patient.  Back Pain Associated symptoms: abdominal pain and dysuria   Associated symptoms: no chest pain   Vaginal Pain Associated symptoms include abdominal pain. Pertinent negatives include no chest pain and no shortness of breath.   Patient has pain in her back and vaginal area. She states both it is constant and it comes and goes. It began last night. She is [redacted] weeks pregnant. She has a history of preterm labor. She has had steroids for positive fetal fibronectin. She states she has had some pain with urination. She states she's had some yellow mucus has vaginal discharge. She denies vaginal bleeding. She states she's still feeling the baby move. She is unsure if this feels like previous contractions. Past Medical History  Diagnosis Date  . Asthma   . Asthma   . Preterm labor     1st pregnancy d/t pre-e  . Pregnancy induced hypertension     previous pregnancy  . Pyelonephritis     June 2013  . Varicose veins    Past Surgical History  Procedure Laterality Date  . No past surgeries     Family History  Problem Relation Age of Onset  . Anesthesia problems Neg Hx   . Hypotension Neg Hx   . Malignant hyperthermia Neg Hx   . Pseudochol deficiency Neg Hx   . Other Neg Hx   . Hypertension Mother   . Asthma Father    History  Substance Use Topics  . Smoking status: Never Smoker   . Smokeless tobacco: Never Used  . Alcohol Use: No     Comment: a little when not pregnant   OB History   Grav Para Term Preterm Abortions TAB SAB Ect Mult Living   3 2 1 1  0 0 0 0 0 2     Review of  Systems  Constitutional: Negative for appetite change.  HENT: Negative for congestion.   Respiratory: Negative for shortness of breath.   Cardiovascular: Negative for chest pain.  Gastrointestinal: Positive for abdominal pain.  Genitourinary: Positive for dysuria and vaginal pain.       Pregnant  Musculoskeletal: Positive for back pain.  Skin: Negative for wound.  Neurological: Negative for light-headedness.      Allergies  Iohexol  Home Medications   Current Outpatient Rx  Name  Route  Sig  Dispense  Refill  . acetaminophen (TYLENOL) 325 MG tablet   Oral   Take 650 mg by mouth every 6 (six) hours as needed for mild pain, fever or headache.         . hydrocortisone (ANUSOL-HC) 25 MG suppository   Rectal   Place 25 mg rectally 2 (two) times daily as needed for hemorrhoids or itching.         . Prenatal Vit-Fe Fumarate-FA (PRENATAL MULTIVITAMIN) TABS   Oral   Take 1 tablet by mouth daily at 12 noon.          BP 114/65  Pulse 125  Temp(Src) 98.6 F (37 C) (Oral)  Resp 18  SpO2 96%  LMP 04/30/2013 Physical Exam  Nursing note and vitals  reviewed. Constitutional: She is oriented to person, place, and time. She appears well-developed and well-nourished.  Patient appears uncomfortable  HENT:  Head: Normocephalic and atraumatic.  Eyes: EOM are normal. Pupils are equal, round, and reactive to light.  Neck: Normal range of motion. Neck supple.  Cardiovascular: Regular rhythm and normal heart sounds.   No murmur heard. Tachycardia  Pulmonary/Chest: Effort normal and breath sounds normal. No respiratory distress. She has no wheezes. She has no rales.  Abdominal: She exhibits mass. She exhibits no distension. There is no tenderness. There is no rebound and no guarding.  Gravid to well above the umbilicus. Mild diffuse tenderness  Genitourinary:  Pelvic exam deferred to rapid response OB nurse.  Musculoskeletal: Normal range of motion.  Neurological: She is alert and  oriented to person, place, and time. No cranial nerve deficit.  Skin: Skin is warm and dry.  Psychiatric: She has a normal mood and affect. Her speech is normal.    ED Course  Procedures (including critical care time) Labs Review Labs Reviewed  CBC - Abnormal; Notable for the following:    RBC 3.53 (*)    Hemoglobin 10.7 (*)    HCT 30.9 (*)    All other components within normal limits  URINALYSIS, ROUTINE W REFLEX MICROSCOPIC  BASIC METABOLIC PANEL  HEPATIC FUNCTION PANEL   Imaging Review No results found.  EKG Interpretation   None       MDM   Final diagnoses:  Threatened preterm labor    Patient with lower abdominal pain and back pain. She is approximately [redacted]   weeks pregnant. History of preterm labor. patient does have some pooling of fluid in the vagina per rapid response OB nurse. Discussed with Dr. Jolayne Panther and patient will be transferred to [facility] hospital  [facility] R. Rubin Payor, MD 01/26/14 [phone]

## 2014-01-30 ENCOUNTER — Ambulatory Visit (INDEPENDENT_AMBULATORY_CARE_PROVIDER_SITE_OTHER): Payer: Self-pay | Admitting: Obstetrics & Gynecology

## 2014-01-30 VITALS — BP 106/63 | Temp 97.6°F | Wt 140.1 lb

## 2014-01-30 DIAGNOSIS — O4700 False labor before 37 completed weeks of gestation, unspecified trimester: Secondary | ICD-10-CM

## 2014-01-30 LAB — POCT URINALYSIS DIP (DEVICE)
Bilirubin Urine: NEGATIVE
Glucose, UA: NEGATIVE mg/dL
Ketones, ur: NEGATIVE mg/dL
Leukocytes, UA: NEGATIVE
Nitrite: NEGATIVE
PH: 7 (ref 5.0–8.0)
PROTEIN: NEGATIVE mg/dL
Specific Gravity, Urine: 1.02 (ref 1.005–1.030)
Urobilinogen, UA: 0.2 mg/dL (ref 0.0–1.0)

## 2014-01-30 MED ORDER — GUAIFENESIN 100 MG/5ML PO SOLN: 5.0000 mL | ORAL | Status: DC | PRN

## 2014-01-30 MED ORDER — PSEUDOEPHEDRINE HCL 30 MG/5ML PO SYRP: 30.0000 mg | ORAL_SOLUTION | Freq: Four times a day (QID) | ORAL | Status: DC | PRN

## 2014-01-30 NOTE — Progress Notes (Signed)
Pulse: 76

## 2014-01-30 NOTE — Progress Notes (Signed)
Pt c/o 3 days headache and runny nose.  Pt had temp of 100 a few days ago.  No  Productive cough.  Lungs CTA bilaterally.  Pt seen 4 days ago and diagnosed with sinus infection and given amoxicillin.  Most likely viral in origin however.  Contractions continue less tha 4 an hour.  PTL precautions given.

## 2014-02-01 ENCOUNTER — Inpatient Hospital Stay (HOSPITAL_COMMUNITY)
Admission: AD | Admit: 2014-02-01 | Discharge: 2014-02-02 | Disposition: A | Discharge status: Home / Self Care | Payer: Self-pay | Source: Ambulatory Visit | Attending: Obstetrics & Gynecology | Admitting: Obstetrics & Gynecology

## 2014-02-01 ENCOUNTER — Encounter (HOSPITAL_COMMUNITY): Payer: Self-pay | Admitting: *Deleted

## 2014-02-01 DIAGNOSIS — R059 Cough, unspecified: Secondary | ICD-10-CM | POA: Insufficient documentation

## 2014-02-01 DIAGNOSIS — O479 False labor, unspecified: Secondary | ICD-10-CM

## 2014-02-01 DIAGNOSIS — O4700 False labor before 37 completed weeks of gestation, unspecified trimester: Secondary | ICD-10-CM | POA: Insufficient documentation

## 2014-02-01 DIAGNOSIS — J329 Chronic sinusitis, unspecified: Secondary | ICD-10-CM | POA: Insufficient documentation

## 2014-02-01 DIAGNOSIS — R05 Cough: Secondary | ICD-10-CM | POA: Insufficient documentation

## 2014-02-01 NOTE — MAU Note (Signed)
Contractions for 2 days. Took Tylenol and shower and they stopped. Have a cough. Some mucous d/c. Saw alittle blood in mucous

## 2014-02-02 DIAGNOSIS — O4700 False labor before 37 completed weeks of gestation, unspecified trimester: Secondary | ICD-10-CM

## 2014-02-02 LAB — WET PREP, GENITAL
Clue Cells Wet Prep HPF POC: NONE SEEN
Trich, Wet Prep: NONE SEEN
Yeast Wet Prep HPF POC: NONE SEEN

## 2014-02-02 NOTE — Discharge Instructions (Signed)
Tercer trimestre del embarazo  (Third Trimester of Pregnancy) El tercer trimestre del embarazo abarca desde la semana 29 hasta la semana 42, desde el 7 mes hasta el 9. En este trimestre el feto se desarrolla muy rpidamente. Hacia el final del noveno mes, el beb que an no ha nacido mide alrededor de 20 pulgadas (45 cm) de largo y pesa entre 6 y 10 libras (2,700 y 4,500 kg).  CAMBIOS CORPORALES  Su organismo atravesar numerosos cambios durante el embarazo. Los cambios varan de una mujer a otra.   Seguir aumentando de peso. Es esperable que aumente entre 25 y 35 libras (11 16 kg) hacia el final del embarazo.  Podrn aparecer las primeras estras en las caderas, abdomen y mamas.  Tendr necesidad de orinar con ms frecuencia porque el feto baja hacia la pelvis y presiona en la vejiga.  Como consecuencia del embarazo, podr sentir acidez estomacal continuamente.  Podr estar constipada ya que ciertas hormonas hacen que los msculos que hacen progresar los desechos a travs de los intestinos trabajen ms lentamente.  Pueden aparecer hemorroides o abultarse e hincharse las venas (venas varicosas).  Podr sentir dolor plvico debido al aumento de peso ya que las hormonas del embarazo relajan las articulaciones entre los huesos de la pelvis. El dolor de espalda puede ser consecuencia de la exigencia de los msculos que soportan la postura.  Sus mamas seguirn desarrollndose y estarn ms sensibles. A veces sale una secrecin amarilla de las mamas, que se llama calostro.  El ombligo puede salir hacia afuera.  Podr sentir que le falta el aire debido a que se expande el tero.  Podr notar que el feto "baja" o que se siente ms bajo en el abdomen.  Podr tener una prdida de secrecin mucosa con sangre. Esto suele ocurrir entre unos pocos das y una semana antes del parto.  El cuello se vuelve delgado y blando (se borra) cerca de la fecha de parto. QU DEBE ESPERAR EN LAS CONSULTAS  PRENATALES  Le harn exmenes prenatales cada 2 semanas hasta la semana 36. A partir de ese momento le harn exmenes semanales. Durante una visita prenatal de rutina:   La pesarn para verificar que usted y el feto se encuentran dentro de los lmites normales.  Le tomarn la presin arterial.  Le medirn el abdomen para verificar el desarrollo del beb.  Escucharn los latidos fetales.  Se evaluarn los resultados de los estudios solicitados en visitas anteriores.  Le controlarn el cuello del tero cuando est prxima la fecha de parto para ver si se ha borrado. Alrededor de la semana 36 el mdico controlar el cuello del tero. Al mismo tiempo realizar un anlisis de las secreciones del tejido vaginal. Este examen es para determinar si hay un tipo de bacteria, estreptococo Grupo B. El mdico le explicar esto con ms detalle.  El mdico podr preguntarle:   Como le gustara que fuera el parto.  Cmo se siente.  Si siente los movimientos del beb.  Si tiene sntomas anormales, como prdida de lquido, sangrado, dolores de cabeza intenso o clicos abdominales.  Si tiene alguna duda. Otros estudios que podrn realizarse durante el tercer trimestre son:   Anlisis de sangre para controlar sus niveles de hierro (anemia).  Controles fetales para determinar su salud, el nivel de actividad y su desarrollo. Si tiene alguna enfermedad o si tuvo problemas durante el embarazo, le harn estudios. FALSO TRABAJO DE PARTO  Es posible que sienta contracciones pequeas e irregulares que finalmente   desaparecen. Se llaman contracciones de Braxton Hicks o falso trabajo de parto. Las contracciones pueden durar horas, das o an semanas antes de que el verdadero trabajo de parto se inicie. Si las contracciones tienen intervalos regulares, se intensifican o se hacen dolorosas, lo mejor es que la revise su mdico.  SIGNOS DE TRABAJO DE PARTO   Espasmos del tipo menstrual.  Contracciones cada 5  minutos o menos.  Contracciones que comienzan en la parte superior del tero y se expanden hacia abajo, a la zona inferior del abdomen y la espalda.  Sensacin de presin que aumenta en la pelvis o dolor en la espalda.  Aparece una secrecin acuosa o sanguinolenta por la vagina. Si tiene alguno de estos signos antes de la semana 37 del embarazo, llame a su mdico inmediatamente. Debe concurrir al hospital para ser controlada inmediatamente.  INSTRUCCIONES PARA EL CUIDADO EN EL HOGAR   Evite fumar, consumir hierbas, beber alcohol y utilizar frmacos que no le hayan recetado. Estas sustancias qumicas afectan la formacin y el desarrollo del beb.  Siga las indicaciones del profesional con respecto a como tomar los medicamentos. Durante el embarazo, hay medicamentos que son seguros y otros no lo son.  Realice actividad fsica slo segn las indicaciones del mdico. Sentir clicos uterinos es el mejor signo para detener la actividad fsica.  Contine haciendo comidas regulares y sanas.  Use un sostn que le brinde buen soporte si sus mamas estn sensibles.  No utilice la baera con agua caliente, baos turcos o saunas.  Colquese el cinturn de seguridad cuando conduzca.  Evite comer carne cruda queso sin cocinar y el contacto con los utensilios y desperdicios de los gatos. Estos elementos contienen grmenes que pueden causar defectos de nacimiento en el beb.  Tome las vitaminas indicadas para la etapa prenatal.  Pruebe un laxante (si el mdico la autoriza) si tiene constipacin. Consuma ms alimentos ricos en fibra, como vegetales y frutas frescos y cereales enteros. Beba gran cantidad de lquido para mantener la orina de tono claro o amarillo plido.  Tome baos de agua tibia para calmar el dolor o las molestias causadas por las hemorroides. Use una crema para las hemorroides si el mdico la autoriza.  Si tiene venas varicosas, use medias de soporte. Eleve los pies durante 15 minutos,  3 4 veces por da. Limite el consumo de sal en su dieta.  Evite levantar objetos pesados, use zapatos de tacones bajos y mantenga una buena postura.  Descanse con las piernas elevadas si tiene calambres o dolor de cintura.  Visite a su dentista si no lo ha hecho durante el embarazo. Use un cepillo de dientes blando para higienizarse los dientes y use suavemente el hilo dental.  Puede continuar su vida sexual excepto que el mdico le indique otra cosa.  No haga viajes largos excepto que sea absolutamente necesario y slo con la aprobacin de su mdico.  Tome clases prenatales para entender, practicar y hacer preguntas sobre el trabajo de parto y el alumbramiento.  Haga un ensayo sobre la partida al hospital.  Prepare el bolso que llevar al hospital.  Prepare la habitacin del beb.  Contine concurriendo a todas las visitas prenatales segn las indicaciones de su mdico. SOLICITE ATENCIN MDICA SI:   No est segura si est en trabajo de parto o ha roto la bolsa de aguas.  Tiene mareos.  Siente clicos leves, presin en la pelvis o dolor persistente en el abdomen.  Tiene nuseas o vmitos persistentes.  Observa una   secrecin vaginal con mal olor.  Siente dolor al orinar. SOLICITE ATENCIN MDICA DE INMEDIATO SI:   Tiene fiebre.  Pierde lquido o sangre por la vagina.  Tiene sangrado o pequeas prdidas vaginales.  Siente dolor intenso o clicos en el abdomen.  Sube o baja de peso rpidamente.  Le falta el aire y le duele el pecho al respirar.  Sbitamente se le hincha el rostro, las manos, los tobillos, los pies o las piernas de manera extrema.  No ha sentido los movimientos del beb durante una hora.  Siente un dolor de cabeza intenso que no se alivia con medicamentos.  Su visin se modifica. Document Released: 09/07/2005 Document Revised: 07/31/2013 [employer] Patient Information 2014 [employer].  

## 2014-02-02 NOTE — MAU Provider Note (Signed)
First Provider Initiated Contact with Patient 02/01/14 2353      Chief Complaint:  Cough and Contractions   Cathy Nash is  33 y.o. [id number] at [id number] presents complaining of contractions overnight. Pt reports 3 ctx throughout the day. Not frequent. Reports normal fetal movement, no LOF, but has had some white discharge that is mucousy in consistency.  Pt has had a cough for the last week and was started on ABx for sinus infection on 2/15 and is almost done with Rx. Cough is now nonproductive.  Obstetrical/Gynecological History: OB History   Grav Para Term Preterm Abortions TAB SAB Ect Mult Living   3 2 1 1  0 0 0 0 0 2     Past Medical History: Past Medical History  Diagnosis Date  . Asthma   . Asthma   . Preterm labor     1st pregnancy d/t pre-e  . Pregnancy induced hypertension     previous pregnancy  . Pyelonephritis     June 2013  . Varicose veins     Past Surgical History: Past Surgical History  Procedure Laterality Date  . No past surgeries      Family History: Family History  Problem Relation Age of Onset  . Anesthesia problems Neg Hx   . Hypotension Neg Hx   . Malignant hyperthermia Neg Hx   . Pseudochol deficiency Neg Hx   . Other Neg Hx   . Hypertension Mother   . Asthma Father     Social History: History  Substance Use Topics  . Smoking status: Never Smoker   . Smokeless tobacco: Never Used  . Alcohol Use: No     Comment: a little when not pregnant    Allergies:  Allergies  Allergen Reactions  . Iohexol Hives and Shortness Of Breath     Code: SOB, Desc: IMMEDIATELY SOB FOLLOWING IV INJECTION, SPOTTY HIVES, PT GIVEN BENADRYL AND EPI-ARS 08/22/09, Onset Date: [id number]     Meds:  Prescriptions prior to admission  Medication Sig Dispense Refill  . acetaminophen (TYLENOL) 325 MG tablet Take 650 mg by mouth every 6 (six) hours as needed for mild pain, fever or headache.      Marland Kitchen. amoxicillin-clavulanate (AUGMENTIN) 875-125 MG per tablet  Take 1 tablet by mouth every 12 (twelve) hours.  20 tablet  0  . guaiFENesin (ROBITUSSIN) 100 MG/5ML SOLN Take 5 mLs (100 mg total) by mouth every 4 (four) hours as needed for cough or to loosen phlegm.  1200 mL  0  . hydrocortisone (ANUSOL-HC) 25 MG suppository Place 25 mg rectally 2 (two) times daily as needed for hemorrhoids or itching.      . Prenatal Vit-Fe Fumarate-FA (PRENATAL MULTIVITAMIN) TABS Take 1 tablet by mouth daily at 12 noon.      Marland Kitchen. oxyCODONE-acetaminophen (PERCOCET/ROXICET) 5-325 MG per tablet Take 2 tablets by mouth every 6 (six) hours as needed for severe pain.  30 tablet  0  . pseudoephedrine (SUDAFED) 30 MG/5ML syrup Take 5 mLs (30 mg total) by mouth every 6 (six) hours as needed for congestion.  118 mL  0    Review of Systems -   Review of Systems  +Cough, non productive. No F/C, sob, n/v, d/c, or other complaints today.    Physical Exam  Blood pressure 108/64, pulse 84, temperature 97.7 F (36.5 C), resp. rate 20, height 5\' 1"  (1.549 m), weight 65.59 kg (144 lb 9.6 oz), last menstrual period 04/30/2013. GENERAL: Well-developed, well-nourished female in no acute distress.  ABDOMEN: Soft, nontender, nondistended, gravid.  Dilation: 1.5 Effacement (%): 30 Cervical Position: Middle Station: -2 Presentation: Vertex Exam by:: Dr. Ike Bene  SSE: white discharge, mucousy  EXTREMITIES: Nontender, no edema, 2+ distal pulses.  FHT:  Baseline rate 130s bpm   Variability moderate  Accelerations present   Decelerations none Contractions: none on monitor   Labs: Results for orders placed during the hospital encounter of 02/01/14 (from the past 24 hour(s))  WET PREP, GENITAL   Collection Time    02/01/14 11:56 PM      Result Value Ref Range   Yeast Wet Prep HPF POC NONE SEEN  NONE SEEN   Trich, Wet Prep NONE SEEN  NONE SEEN   Clue Cells Wet Prep HPF POC NONE SEEN  NONE SEEN   WBC, Wet Prep HPF POC FEW (*) NONE SEEN   Imaging Studies:  No results  found.  Assessment: Cathy Nash is  33 y.o. [id number] at [redacted]w[redacted]d presents with Preterm contx x3 today. Low concern for active labor. She had a +FFN on 1/29 and 1/30 and recd BMZ - wet mount neg - PTL reviewed discharged home  Cathy Nash 2/22/201512:36 AM

## 2014-02-03 NOTE — MAU Provider Note (Signed)
Attestation of Attending Supervision of Fellow: Evaluation and management procedures were performed by the Fellow under my supervision and collaboration.  I have reviewed the Fellow's note and chart, and I agree with the management and plan.    

## 2014-02-13 ENCOUNTER — Ambulatory Visit (INDEPENDENT_AMBULATORY_CARE_PROVIDER_SITE_OTHER): Payer: Self-pay | Admitting: Obstetrics & Gynecology

## 2014-02-13 VITALS — BP 108/72 | Temp 97.5°F | Wt 145.8 lb

## 2014-02-13 DIAGNOSIS — O0990 Supervision of high risk pregnancy, unspecified, unspecified trimester: Secondary | ICD-10-CM

## 2014-02-13 DIAGNOSIS — O09219 Supervision of pregnancy with history of pre-term labor, unspecified trimester: Secondary | ICD-10-CM

## 2014-02-13 DIAGNOSIS — O09899 Supervision of other high risk pregnancies, unspecified trimester: Secondary | ICD-10-CM

## 2014-02-13 DIAGNOSIS — O4700 False labor before 37 completed weeks of gestation, unspecified trimester: Secondary | ICD-10-CM

## 2014-02-13 DIAGNOSIS — O368190 Decreased fetal movements, unspecified trimester, not applicable or unspecified: Secondary | ICD-10-CM

## 2014-02-13 DIAGNOSIS — O09299 Supervision of pregnancy with other poor reproductive or obstetric history, unspecified trimester: Secondary | ICD-10-CM

## 2014-02-13 LAB — POCT URINALYSIS DIP (DEVICE)
Bilirubin Urine: NEGATIVE
Glucose, UA: NEGATIVE mg/dL
KETONES UR: NEGATIVE mg/dL
Leukocytes, UA: NEGATIVE
Nitrite: NEGATIVE
Protein, ur: NEGATIVE mg/dL
Specific Gravity, Urine: 1.025 (ref 1.005–1.030)
Urobilinogen, UA: 0.2 mg/dL (ref 0.0–1.0)
pH: 6.5 (ref 5.0–8.0)

## 2014-02-13 LAB — OB RESULTS CONSOLE GBS: STREP GROUP B AG: NEGATIVE

## 2014-02-13 NOTE — Progress Notes (Signed)
Slight pink mucus discharge. PTL precautions given and last 17 P inj.

## 2014-02-13 NOTE — Progress Notes (Signed)
Pulse: 84 Pt reports some spotting 3 days ago. Now she has jelly looking discharge coming out.  Pt reports that she has a headache all the time.

## 2014-02-13 NOTE — Patient Instructions (Signed)
Preterm Labor Information Preterm labor is when labor starts at less than 37 weeks of pregnancy. The normal length of a pregnancy is 39 to 41 weeks. CAUSES Often, there is no identifiable underlying cause as to why a woman goes into preterm labor. One of the most common known causes of preterm labor is infection. Infections of the uterus, cervix, vagina, amniotic sac, bladder, kidney, or even the lungs (pneumonia) can cause labor to start. Other suspected causes of preterm labor include:   Urogenital infections, such as yeast infections and bacterial vaginosis.   Uterine abnormalities (uterine shape, uterine septum, fibroids, or bleeding from the placenta).   A cervix that has been operated on (it may fail to stay closed).   Malformations in the fetus.   Multiple gestations (twins, triplets, and so on).   Breakage of the amniotic sac.  RISK FACTORS  Having a previous history of preterm labor.   Having premature rupture of membranes (PROM).   Having a placenta that covers the opening of the cervix (placenta previa).   Having a placenta that separates from the uterus (placental abruption).   Having a cervix that is too weak to hold the fetus in the uterus (incompetent cervix).   Having too much fluid in the amniotic sac (polyhydramnios).   Taking illegal drugs or smoking while pregnant.   Not gaining enough weight while pregnant.   Being younger than 18 and older than 33 years old.   Having a low socioeconomic status.   Being African American. SYMPTOMS Signs and symptoms of preterm labor include:   Menstrual-like cramps, abdominal pain, or back pain.  Uterine contractions that are regular, as frequent as six in an hour, regardless of their intensity (may be mild or painful).  Contractions that start on the top of the uterus and spread down to the lower abdomen and back.   A sense of increased pelvic pressure.   A watery or bloody mucus discharge that  comes from the vagina.  TREATMENT Depending on the length of the pregnancy and other circumstances, your health care provider may suggest bed rest. If necessary, there are medicines that can be given to stop contractions and to mature the fetal lungs. If labor happens before 34 weeks of pregnancy, a prolonged hospital stay may be recommended. Treatment depends on the condition of both you and the fetus.  WHAT SHOULD YOU DO IF YOU THINK YOU ARE IN PRETERM LABOR? Call your health care provider right away. You will need to go to the hospital to get checked immediately. HOW CAN YOU PREVENT PRETERM LABOR IN FUTURE PREGNANCIES? You should:   Stop smoking if you smoke.  Maintain healthy weight gain and avoid chemicals and drugs that are not necessary.  Be watchful for any type of infection.  Inform your health care provider if you have a known history of preterm labor. Document Released: 02/18/2004 Document Revised: 07/31/2013 Document Reviewed: 12/31/2012 [employer] Patient Information 2014 [employer].    

## 2014-02-14 LAB — GC/CHLAMYDIA PROBE AMP
CT PROBE, AMP APTIMA: NEGATIVE
GC PROBE AMP APTIMA: NEGATIVE

## 2014-02-16 LAB — CULTURE, BETA STREP (GROUP B ONLY)

## 2014-02-18 ENCOUNTER — Inpatient Hospital Stay (HOSPITAL_COMMUNITY)
Admission: AD | Admit: 2014-02-18 | Discharge: 2014-02-18 | DRG: 780 | Disposition: A | Discharge status: Home / Self Care | Payer: Self-pay | Source: Ambulatory Visit | Attending: Obstetrics & Gynecology | Admitting: Obstetrics & Gynecology

## 2014-02-18 ENCOUNTER — Encounter (HOSPITAL_COMMUNITY): Payer: Self-pay | Admitting: *Deleted

## 2014-02-18 DIAGNOSIS — O479 False labor, unspecified: Secondary | ICD-10-CM

## 2014-02-18 DIAGNOSIS — O99891 Other specified diseases and conditions complicating pregnancy: Secondary | ICD-10-CM

## 2014-02-18 DIAGNOSIS — O09299 Supervision of pregnancy with other poor reproductive or obstetric history, unspecified trimester: Secondary | ICD-10-CM

## 2014-02-18 DIAGNOSIS — O09899 Supervision of other high risk pregnancies, unspecified trimester: Secondary | ICD-10-CM

## 2014-02-18 DIAGNOSIS — O9989 Other specified diseases and conditions complicating pregnancy, childbirth and the puerperium: Secondary | ICD-10-CM

## 2014-02-18 DIAGNOSIS — O09219 Supervision of pregnancy with history of pre-term labor, unspecified trimester: Secondary | ICD-10-CM

## 2014-02-18 DIAGNOSIS — Z87891 Personal history of nicotine dependence: Secondary | ICD-10-CM

## 2014-02-18 DIAGNOSIS — J45909 Unspecified asthma, uncomplicated: Secondary | ICD-10-CM

## 2014-02-18 DIAGNOSIS — O4700 False labor before 37 completed weeks of gestation, unspecified trimester: Secondary | ICD-10-CM

## 2014-02-18 DIAGNOSIS — O0990 Supervision of high risk pregnancy, unspecified, unspecified trimester: Secondary | ICD-10-CM

## 2014-02-18 DIAGNOSIS — IMO0001 Reserved for inherently not codable concepts without codable children: Secondary | ICD-10-CM

## 2014-02-18 LAB — CBC
HCT: 30.8 % — ABNORMAL LOW (ref 36.0–46.0)
Hemoglobin: 10.4 g/dL — ABNORMAL LOW (ref 12.0–15.0)
MCH: 29.2 pg (ref 26.0–34.0)
MCHC: 33.8 g/dL (ref 30.0–36.0)
MCV: 86.5 fL (ref 78.0–100.0)
PLATELETS: 303 10*3/uL (ref 150–400)
RBC: 3.56 MIL/uL — AB (ref 3.87–5.11)
RDW: 13.9 % (ref 11.5–15.5)
WBC: 8 10*3/uL (ref 4.0–10.5)

## 2014-02-18 LAB — RPR: RPR: NONREACTIVE

## 2014-02-18 LAB — POCT FERN TEST: POCT Fern Test: NEGATIVE

## 2014-02-18 MED ORDER — IBUPROFEN 600 MG PO TABS: 600.0000 mg | ORAL_TABLET | Freq: Four times a day (QID) | ORAL | Status: DC | PRN

## 2014-02-18 MED ORDER — ACETAMINOPHEN 325 MG PO TABS
650.0000 mg | ORAL_TABLET | ORAL | Status: DC | PRN
  Administered 2014-02-18: 650 mg via ORAL
  Filled 2014-02-18: qty 2

## 2014-02-18 MED ORDER — OXYCODONE-ACETAMINOPHEN 5-325 MG PO TABS: 1.0000 | ORAL_TABLET | ORAL | Status: DC | PRN

## 2014-02-18 MED ORDER — LACTATED RINGERS IV SOLN
INTRAVENOUS | Status: DC
  Administered 2014-02-18: 21:00:00 via INTRAVENOUS

## 2014-02-18 MED ORDER — OXYTOCIN 40 UNITS IN LACTATED RINGERS INFUSION - SIMPLE MED: 62.5000 mL/h | INTRAVENOUS | Status: DC

## 2014-02-18 MED ORDER — LIDOCAINE HCL (PF) 1 % IJ SOLN: 30.0000 mL | INTRAMUSCULAR | Status: DC | PRN

## 2014-02-18 MED ORDER — FENTANYL CITRATE 0.05 MG/ML IJ SOLN: 100.0000 ug | INTRAMUSCULAR | Status: DC | PRN

## 2014-02-18 MED ORDER — CITRIC ACID-SODIUM CITRATE 334-500 MG/5ML PO SOLN: 30.0000 mL | ORAL | Status: DC | PRN

## 2014-02-18 MED ORDER — OXYTOCIN BOLUS FROM INFUSION: 500.0000 mL | INTRAVENOUS | Status: DC

## 2014-02-18 MED ORDER — ONDANSETRON HCL 4 MG/2ML IJ SOLN: 4.0000 mg | Freq: Four times a day (QID) | INTRAMUSCULAR | Status: DC | PRN

## 2014-02-18 MED ORDER — ZOLPIDEM TARTRATE 5 MG PO TABS
5.0000 mg | ORAL_TABLET | Freq: Once | ORAL | Status: AC
  Administered 2014-02-18: 5 mg via ORAL
  Filled 2014-02-18: qty 1

## 2014-02-18 MED ORDER — LACTATED RINGERS IV SOLN: 500.0000 mL | INTRAVENOUS | Status: DC | PRN

## 2014-02-18 NOTE — MAU Note (Signed)
Maternal pulse showing while pt sitting up

## 2014-02-18 NOTE — Discharge Instructions (Signed)
Third Trimester of Pregnancy  The third trimester is from week 29 through week 42, months 7 through 9. The third trimester is a time when the fetus is growing rapidly. At the end of the ninth month, the fetus is about 20 inches in length and weighs 6 10 pounds.   BODY CHANGES  Your body goes through many changes during pregnancy. The changes vary from woman to woman.    Your weight will continue to increase. You can expect to gain 25 35 pounds (11 16 kg) by the end of the pregnancy.   You may begin to get stretch marks on your hips, abdomen, and breasts.   You may urinate more often because the fetus is moving lower into your pelvis and pressing on your bladder.   You may develop or continue to have heartburn as a result of your pregnancy.   You may develop constipation because certain hormones are causing the muscles that push waste through your intestines to slow down.   You may develop hemorrhoids or swollen, bulging veins (varicose veins).   You may have pelvic pain because of the weight gain and pregnancy hormones relaxing your joints between the bones in your pelvis. Back aches may result from over exertion of the muscles supporting your posture.   Your breasts will continue to grow and be tender. A yellow discharge may leak from your breasts called colostrum.   Your belly button may stick out.   You may feel short of breath because of your expanding uterus.   You may notice the fetus "dropping," or moving lower in your abdomen.   You may have a bloody mucus discharge. This usually occurs a few days to a week before labor begins.   Your cervix becomes thin and soft (effaced) near your due date.  WHAT TO EXPECT AT YOUR PRENATAL EXAMS   You will have prenatal exams every 2 weeks until week 36. Then, you will have weekly prenatal exams. During a routine prenatal visit:   You will be weighed to make sure you and the fetus are growing normally.   Your blood pressure is taken.   Your abdomen will be  measured to track your baby's growth.   The fetal heartbeat will be listened to.   Any test results from the previous visit will be discussed.   You may have a cervical check near your due date to see if you have effaced.  At around 36 weeks, your caregiver will check your cervix. At the same time, your caregiver will also perform a test on the secretions of the vaginal tissue. This test is to determine if a type of bacteria, Group B streptococcus, is present. Your caregiver will explain this further.  Your caregiver may ask you:   What your birth plan is.   How you are feeling.   If you are feeling the baby move.   If you have had any abnormal symptoms, such as leaking fluid, bleeding, severe headaches, or abdominal cramping.   If you have any questions.  Other tests or screenings that may be performed during your third trimester include:   Blood tests that check for low iron levels (anemia).   Fetal testing to check the health, activity level, and growth of the fetus. Testing is done if you have certain medical conditions or if there are problems during the pregnancy.  FALSE LABOR  You may feel small, irregular contractions that eventually go away. These are called Braxton Hicks contractions, or   false labor. Contractions may last for hours, days, or even weeks before true labor sets in. If contractions come at regular intervals, intensify, or become painful, it is best to be seen by your caregiver.   SIGNS OF LABOR    Menstrual-like cramps.   Contractions that are 5 minutes apart or less.   Contractions that start on the top of the uterus and spread down to the lower abdomen and back.   A sense of increased pelvic pressure or back pain.   A watery or bloody mucus discharge that comes from the vagina.  If you have any of these signs before the 37th week of pregnancy, call your caregiver right away. You need to go to the hospital to get checked immediately.  HOME CARE INSTRUCTIONS    Avoid all  smoking, herbs, alcohol, and unprescribed drugs. These chemicals affect the formation and growth of the baby.   Follow your caregiver's instructions regarding medicine use. There are medicines that are either safe or unsafe to take during pregnancy.   Exercise only as directed by your caregiver. Experiencing uterine cramps is a good sign to stop exercising.   Continue to eat regular, healthy meals.   Wear a good support bra for breast tenderness.   Do not use hot tubs, steam rooms, or saunas.   Wear your seat belt at all times when driving.   Avoid raw meat, uncooked cheese, cat litter boxes, and soil used by cats. These carry germs that can cause birth defects in the baby.   Take your prenatal vitamins.   Try taking a stool softener (if your caregiver approves) if you develop constipation. Eat more high-fiber foods, such as fresh vegetables or fruit and whole grains. Drink plenty of fluids to keep your urine clear or pale yellow.   Take warm sitz baths to soothe any pain or discomfort caused by hemorrhoids. Use hemorrhoid cream if your caregiver approves.   If you develop varicose veins, wear support hose. Elevate your feet for 15 minutes, 3 4 times a day. Limit salt in your diet.   Avoid heavy lifting, wear low heal shoes, and practice good posture.   Rest a lot with your legs elevated if you have leg cramps or low back pain.   Visit your dentist if you have not gone during your pregnancy. Use a soft toothbrush to brush your teeth and be gentle when you floss.   A sexual relationship may be continued unless your caregiver directs you otherwise.   Do not travel far distances unless it is absolutely necessary and only with the approval of your caregiver.   Take prenatal classes to understand, practice, and ask questions about the labor and delivery.   Make a trial run to the hospital.   Pack your hospital bag.   Prepare the baby's nursery.   Continue to go to all your prenatal visits as directed  by your caregiver.  SEEK MEDICAL CARE IF:   You are unsure if you are in labor or if your water has broken.   You have dizziness.   You have mild pelvic cramps, pelvic pressure, or nagging pain in your abdominal area.   You have persistent nausea, vomiting, or diarrhea.   You have a bad smelling vaginal discharge.   You have pain with urination.  SEEK IMMEDIATE MEDICAL CARE IF:    You have a fever.   You are leaking fluid from your vagina.   You have spotting or bleeding from your vagina.     You have severe abdominal cramping or pain.   You have rapid weight loss or gain.   You have shortness of breath with chest pain.   You notice sudden or extreme swelling of your face, hands, ankles, feet, or legs.   You have not felt your baby move in over an hour.   You have severe headaches that do not go away with medicine.   You have vision changes.  Document Released: 11/22/2001 Document Revised: 07/31/2013 Document Reviewed: 01/29/2013  [employer] Patient Information 2014 [employer].

## 2014-02-18 NOTE — Discharge Summary (Signed)
History   CSN: [id number]  Arrival date and time: 02/18/14 1513  First Provider Initiated Contact with Patient 02/18/14 1629  Chief Complaint   Patient presents with   .  Labor Eval   HPI  Cathy Nash is a 33 y.o. [id number] at 3159w0d who presents today with UCs. She states that she has been having contractions off and on for over a week. She denies any VB or LOF and confirms fetal movement.  Past Medical History   Diagnosis  Date   .  Asthma    .  Asthma    .  Preterm labor      1st pregnancy d/t pre-e   .  Pregnancy induced hypertension      previous pregnancy   .  Pyelonephritis      June 2013   .  Varicose veins     Past Surgical History   Procedure  Laterality  Date   .  No past surgeries      Family History   Problem  Relation  Age of Onset   .  Anesthesia problems  Neg Hx    .  Hypotension  Neg Hx    .  Malignant hyperthermia  Neg Hx    .  Pseudochol deficiency  Neg Hx    .  Other  Neg Hx    .  Diabetes  Mother    .  Asthma  Father     History   Substance Use Topics   .  Smoking status:  Former Games developermoker   .  Smokeless tobacco:  Never Used      Comment: yrs ago   .  Alcohol Use:  No      Comment: a little when not pregnant   Allergies:  Allergies   Allergen  Reactions   .  Iohexol  Hives and Shortness Of Breath     Code: SOB, Desc: IMMEDIATELY SOB FOLLOWING IV INJECTION, SPOTTY HIVES, PT GIVEN BENADRYL AND EPI-ARS 08/22/09, Onset Date: [id number]    Prescriptions prior to admission   Medication  Sig  Dispense  Refill   .  acetaminophen (TYLENOL) 325 MG tablet  Take 650 mg by mouth every 6 (six) hours as needed for mild pain, fever or headache.     .  hydrocortisone (ANUSOL-HC) 25 MG suppository  Place 25 mg rectally 2 (two) times daily as needed for hemorrhoids or itching.     .  Prenatal Vit-Fe Fumarate-FA (PRENATAL MULTIVITAMIN) TABS  Take 1 tablet by mouth daily at 12 noon.     .  pseudoephedrine (SUDAFED) 30 MG/5ML syrup  Take 5 mLs (30 mg total) by  mouth every 6 (six) hours as needed for congestion.  118 mL  0   .  [DISCONTINUED] oxyCODONE-acetaminophen (PERCOCET/ROXICET) 5-325 MG per tablet  Take 2 tablets by mouth every 6 (six) hours as needed for severe pain.  30 tablet  0   ROS  Physical Exam   Blood pressure 108/57, pulse 82, temperature 97.7 F (36.5 C), temperature source Oral, resp. rate 18, height 5\' 1"  (1.549 m), weight 67.132 kg (148 lb), last menstrual period 04/30/2013.  Physical Exam  Nursing note and vitals reviewed.  Constitutional: She is oriented to person, place, and time. She appears well-developed and well-nourished. No distress.  Cardiovascular: Normal rate.  Respiratory: Effort normal.  GI: Soft. There is no tenderness.  Genitourinary:   Cervix: 3-4/70/-3 No change over a period of 6 hours   Neurological: She is alert  and oriented to person, place, and time.  Skin: Skin is warm and dry.  Psychiatric: She has a normal mood and affect.  MAU Course   Procedures  FHT: 130, moderate with 15x15 accels, no decels  Toco: rare UC  Assessment and Plan    1.  Active labor at term   2.  Threatened preterm labor   3.  Supervision of high-risk pregnancy   4.  History of preterm delivery, currently pregnant   5.  Hx of preeclampsia, prior pregnancy, currently pregnant   6.  Labor, false (Braxton-Hicks), antepartum   Labor precautions  Fetal kick counts  Return to MAU as needed  FU with next appointment on Thursday

## 2014-02-18 NOTE — MAU Note (Signed)
Contractions and leaking since last night

## 2014-02-18 NOTE — OB Triage Provider Note (Signed)
History     CSN: [id number]  Arrival date and time: 02/18/14 1513   First Provider Initiated Contact with Patient 02/18/14 1629      Chief Complaint  Patient presents with  . Labor Eval   HPI  Cathy Nash is a 33 y.o. [id number] at [redacted]w[redacted]d who presents today with UCs. She states that she has been having contractions off and on for over a week. She denies any VB or LOF and confirms fetal movement.   Past Medical History  Diagnosis Date  . Asthma   . Asthma   . Preterm labor     1st pregnancy d/t pre-e  . Pregnancy induced hypertension     previous pregnancy  . Pyelonephritis     June 2013  . Varicose veins     Past Surgical History  Procedure Laterality Date  . No past surgeries      Family History  Problem Relation Age of Onset  . Anesthesia problems Neg Hx   . Hypotension Neg Hx   . Malignant hyperthermia Neg Hx   . Pseudochol deficiency Neg Hx   . Other Neg Hx   . Diabetes Mother   . Asthma Father     History  Substance Use Topics  . Smoking status: Former Games developer  . Smokeless tobacco: Never Used     Comment: yrs ago  . Alcohol Use: No     Comment: a little when not pregnant    Allergies:  Allergies  Allergen Reactions  . Iohexol Hives and Shortness Of Breath     Code: SOB, Desc: IMMEDIATELY SOB FOLLOWING IV INJECTION, SPOTTY HIVES, PT GIVEN BENADRYL AND EPI-ARS 08/22/09, Onset Date: [id number]     Prescriptions prior to admission  Medication Sig Dispense Refill  . acetaminophen (TYLENOL) 325 MG tablet Take 650 mg by mouth every 6 (six) hours as needed for mild pain, fever or headache.      . hydrocortisone (ANUSOL-HC) 25 MG suppository Place 25 mg rectally 2 (two) times daily as needed for hemorrhoids or itching.      . Prenatal Vit-Fe Fumarate-FA (PRENATAL MULTIVITAMIN) TABS Take 1 tablet by mouth daily at 12 noon.      . pseudoephedrine (SUDAFED) 30 MG/5ML syrup Take 5 mLs (30 mg total) by mouth every 6 (six) hours as needed for congestion.   118 mL  0  . [DISCONTINUED] oxyCODONE-acetaminophen (PERCOCET/ROXICET) 5-325 MG per tablet Take 2 tablets by mouth every 6 (six) hours as needed for severe pain.  30 tablet  0    ROS Physical Exam   Blood pressure 108/57, pulse 82, temperature 97.7 F (36.5 C), temperature source Oral, resp. rate 18, height 5\' 1"  (1.549 m), weight 67.132 kg (148 lb), last menstrual period 04/30/2013.  Physical Exam  Nursing note and vitals reviewed. Constitutional: She is oriented to person, place, and time. She appears well-developed and well-nourished. No distress.  Cardiovascular: Normal rate.   Respiratory: Effort normal.  GI: Soft. There is no tenderness.  Genitourinary:   Cervix: 3-4/70/-3 No change over a period of 6 hours   Neurological: She is alert and oriented to person, place, and time.  Skin: Skin is warm and dry.  Psychiatric: She has a normal mood and affect.    MAU Course  Procedures  FHT: 130, moderate with 15x15 accels, no decels Toco: rare UC  Assessment and Plan   1. Active labor at term   2. Threatened preterm labor   3. Supervision of high-risk pregnancy   4.  History of preterm delivery, currently pregnant   5. Hx of preeclampsia, prior pregnancy, currently pregnant   6. Labor, false (Braxton-Hicks), antepartum    Labor precautions Fetal kick counts  Return to MAU as needed FU with next appointment on Thursday  Tawnya CrookHogan, Heather Donovan 02/18/2014, 10:17 PM   Attestation of Attending Supervision of Advanced Practitioner (CNM/NP): Evaluation and management procedures were performed by the Advanced Practitioner under my supervision and collaboration.  I have reviewed the Advanced Practitioner's note and chart, and I agree with the management and plan.  HARRAWAY-SMITH, [place] Dube 10:29 PM

## 2014-02-18 NOTE — H&P (Signed)
Cathy Nash is a 33 y.o. female presenting for SOOL following reported SROM and contractions. She denies VB, + FM. LOF described as water discharge, approximately 1 pad volume last night and today.  Uncomfortable.   Maternal Medical History:  Reason for admission: Nausea.    OB History   Grav Para Term Preterm Abortions TAB SAB Ect Mult Living   3 2 1 1  0 0 0 0 0 2     Past Medical History  Diagnosis Date  . Asthma   . Asthma   . Preterm labor     1st pregnancy d/t pre-e  . Pregnancy induced hypertension     previous pregnancy  . Pyelonephritis     June 2013  . Varicose veins    Past Surgical History  Procedure Laterality Date  . No past surgeries     Family History: family history includes Asthma in her father; Diabetes in her mother. There is no history of Anesthesia problems, Hypotension, Malignant hyperthermia, Pseudochol deficiency, or Other. Social History:  reports that she has quit smoking. She has never used smokeless tobacco. She reports that she does not drink alcohol or use illicit drugs.   Prenatal Transfer Tool  Maternal Diabetes: No Genetic Screening: Normal Maternal Ultrasounds/Referrals: Normal Fetal Ultrasounds or other Referrals:  None Maternal Substance Abuse:  No Significant Maternal Medications:  None Significant Maternal Lab Results:  None Other Comments:  None  Review of Systems  Constitutional: Negative for fever and chills.  Eyes: Negative for blurred vision and double vision.  Respiratory: Negative for shortness of breath.   Cardiovascular: Negative for chest pain.  Gastrointestinal: Positive for abdominal pain. Negative for nausea and vomiting.  Neurological: Negative for headaches.    Dilation: 4 Effacement (%): 70 Station: -3 Exam by:: Harraway-Smith Blood pressure 108/57, pulse 82, temperature 97.7 F (36.5 C), temperature source Oral, resp. rate 18, height 5\' 1"  (1.549 m), weight 67.132 kg (148 lb), last menstrual  period 04/30/2013. Maternal Exam:  Uterine Assessment: Contraction strength is mild.  Contraction frequency is irregular.   Abdomen: Patient reports the following abdominal tenderness: suprapubic.  Introitus: Ferning test: not done.   Cervix: Cervix evaluated by sterile speculum exam and digital exam.     Fetal Exam Fetal Monitor Review: Mode: ultrasound.   Baseline rate: 120.  Variability: moderate (6-25 bpm).   Pattern: accelerations present and early decelerations.    Fetal State Assessment: Category II - tracings are indeterminate.     Physical Exam  Constitutional: She is oriented to person, place, and time. She appears well-developed and well-nourished. She appears distressed.  HENT:  Head: Normocephalic and atraumatic.  Cardiovascular: Normal rate, regular rhythm and normal heart sounds.   Respiratory: Effort normal and breath sounds normal. No respiratory distress.  GI: She exhibits no mass. There is tenderness in the suprapubic area. There is no rebound and no guarding.  Musculoskeletal: She exhibits no edema.  Neurological: She is alert and oriented to person, place, and time.    Prenatal labs: ABO, Rh: --/--/O POS (10/15 2358) Antibody: Negative (10/20 0000) Rubella:  Immune RPR: NON REAC (01/08 1148)  HBsAg:   Negative HIV: NON REACTIVE (01/08 1148)  GBS: Negative (03/05 0000)   Assessment/Plan: 33 y.o. [id number] @ 7029w0d presents for spontaneous onset of labor. History of IOL for pre-E with delivery @ 35 wk, LBW delivery @ 37.0 wk. Negative nursing ferning examNegative speculum exam in MAU for r/o SROM, no fluid pooling, unable to perform ferning exam. Repeat cervical exam  in MAU showed changes.   #Labor: Early, history of short labor, 2 hours last delivery. Has received BMZ x2 this gestation, after positive FFT from [facility] U/S @ 18 weeks showed EFW 48%ile. Ruled out SROM, will observe for labor.  #Pain: Fentanyl PRN #FWB: Indeterminate Category 2, poor tracing  quality, good variability.  #ID:  GBS neg #Feeding: Breast/Bottle #MOC: Mirena # [facility] patient     Cathy Nash 02/18/2014, 8:43 PM   Attestation of Attending Supervision of Resident: Evaluation and management procedures were performed by the [facility] Resident under my supervision.  I have seen and examined the patient, reviewed the resident's note and chart, and I agree with the management and plan.  Anibal Henderson, M.D. 02/18/2014 10:28 PM

## 2014-02-18 NOTE — Discharge Summary (Signed)
Attestation of Attending Supervision of Advanced Practitioner (CNM/NP): Evaluation and management procedures were performed by the Advanced Practitioner under my supervision and collaboration.  I have reviewed the Advanced Practitioner's note and chart, and I agree with the management and plan.  HARRAWAY-SMITH, Stanisha Lorenz 11:36 PM     

## 2014-02-20 ENCOUNTER — Encounter: Payer: Self-pay | Admitting: Obstetrics & Gynecology

## 2014-02-20 ENCOUNTER — Inpatient Hospital Stay (HOSPITAL_COMMUNITY)
Admission: AD | Admit: 2014-02-20 | Discharge: 2014-02-20 | Disposition: A | Discharge status: Home / Self Care | Payer: Self-pay | Source: Ambulatory Visit | Attending: Obstetrics & Gynecology | Admitting: Obstetrics & Gynecology

## 2014-02-20 ENCOUNTER — Encounter (HOSPITAL_COMMUNITY): Payer: Self-pay | Admitting: *Deleted

## 2014-02-20 DIAGNOSIS — J45909 Unspecified asthma, uncomplicated: Secondary | ICD-10-CM | POA: Insufficient documentation

## 2014-02-20 DIAGNOSIS — Z0371 Encounter for suspected problem with amniotic cavity and membrane ruled out: Secondary | ICD-10-CM

## 2014-02-20 DIAGNOSIS — O09219 Supervision of pregnancy with history of pre-term labor, unspecified trimester: Secondary | ICD-10-CM

## 2014-02-20 DIAGNOSIS — O09299 Supervision of pregnancy with other poor reproductive or obstetric history, unspecified trimester: Secondary | ICD-10-CM

## 2014-02-20 DIAGNOSIS — Z87891 Personal history of nicotine dependence: Secondary | ICD-10-CM | POA: Insufficient documentation

## 2014-02-20 DIAGNOSIS — O479 False labor, unspecified: Secondary | ICD-10-CM | POA: Insufficient documentation

## 2014-02-20 DIAGNOSIS — O4700 False labor before 37 completed weeks of gestation, unspecified trimester: Secondary | ICD-10-CM

## 2014-02-20 DIAGNOSIS — O0990 Supervision of high risk pregnancy, unspecified, unspecified trimester: Secondary | ICD-10-CM

## 2014-02-20 DIAGNOSIS — O09899 Supervision of other high risk pregnancies, unspecified trimester: Secondary | ICD-10-CM

## 2014-02-20 LAB — POCT FERN TEST
POCT FERN TEST: NEGATIVE
POCT FERN TEST: NEGATIVE

## 2014-02-20 NOTE — Discharge Instructions (Signed)
Ruptura prematura de [place] Territorymembranas y ruptura prematura de membranas antes de trmino  (Premature Rupture and Preterm Premature Rupture of Membranes) La ruptura prematura de membranas (RPM) se produce cuando las membranas (saco amnitico) se rompen antes de que comiencen las contracciones o el trabajo de [place]. La ruptura de las 1406 Q Stmembranas se conoce comnmente como la rotura de la bolsa de [place]. Si esto ocurre antes de la semana 37, se denomina ruptura prematura de las 1406 Q Stmembranas pretrmino (RPMP) El saco amnitico mantiene el feto en su lugar, previene infecciones y cumple otras funciones importantes. Si el saco amnitico se rompe antes de las 37 semanas de [place]bar[place], puede causar problemas graves y requiere atencin inmediata por parte del mdico.  CAUSAS  La RPMP cerca del final del embarazo puede estar originada en el debilitamiento natural de las [place]embranas. La causa de la RPMP con frecuencia es una infeccin. Otros factores que pueden estar asociados a la RPM son:   Distensin del Primary school teachersaco amnitico debido a que es un embarazo mltiple o hay demasiado lquido amnitico.  Traumatismos.  Fumar durante el embarazo.  Dficit nutricional.  Nacimientos previos antes de trmino.  Hemorragia vaginal.  Poco o nulo cuidado prenatal.  Problemas con la placenta, como placenta previa o abrupcin placentaria. RIESGOS DE LA RPMP   Dar a luz un beb prematuro.  Sufrir una infeccin grave en los tejidos placentarios (corioamnionitis).  Desprendimiento temprano de la placenta en el tero (abrupcin placentaria).  Compresin del cordn umbilical.  Necesidad de realizar E[place]ace Quailuna cesrea.  Desarrollo de una infeccin grave despus del parto. SIGNOS DE RPM O RPMP   Perdida repentina o lenta de lquido por la vagina.  Moja la ropa interior de [place] permanente. En algunos casos, las mujeres confunden la prdida o goteo con orina, especialmente si la prdida es lenta y no hay un chorro. Si hay una prdida  constante o su ropa interior continua mojndose, es probable que haya roto las Rav[place]embranas.  QU HACER SI PIENSA QUE HA ROTO LAS MEMBRANAS  Comunquese con su mdico de inmediato. Debe concurrir al hospital para ser controlada inmediatamente.  QU OCURRE SI LE DIAGNOSTICAN UNA RPM O RPMP?  Pollyann SavoyUna vez que llegue al hospital le harn Jabil Circuitalgunos estudios. Le harn un examen del cuello para verificar si se ha ablandado o ha comenzado a abrirse (dilatado). Si le han diagnosticado RPM, es posible que le induzcan el parto dentro de las 24 horas, si no tiene contracciones. Si le han diagnosticado una RPMP, y no tiene English as a second language teachercontracciones, es posible que le induzcan el parto segn en qu trimestre se encuentre.  Si tiene Fortune Brandsuna RPMP, usted:   Y su beb sern controlados estrechamente para buscar signos de infeccin u otras complicaciones.  Le indicarn antibiticos para disminuir las probabilidades de que desarrolle una infeccin.  Le darn un corticoide para ayudar a madurar ms rpidamente los pulmones del beb.  Es posible que le administren medicamentos para detener el trabajo de [place].  Le indicarn hacer reposo en cama, en su casa o en el hospital.  Podrn inducirle el parto si surgen complicaciones para usted o para el beb. El tratamiento depender de varios factores, como en qu etapa del embarazo se encuentra, el desarrollo del beb y otras complicaciones que puedan surgir.  Document Released: 03/06/2008 Document Revised: 07/31/2013 [facility] LPExit[facility] Patient Information 2014 [place], [place].

## 2014-02-20 NOTE — MAU Note (Signed)
PT SAYS SHE STARTED HAVING UC BAD AT  6PM.      THINKS SROM AT 330PM-     SHE WAS WALKING - AND HAD   GUSH OF FLUID.    VE   YESTERDAY  IN MAU  4 CM-  SENT HOME.      DENIES HSV AND MRSA.

## 2014-02-20 NOTE — MAU Provider Note (Signed)
None     Chief Complaint:  Contractions and Possible ROM  [name] is  33 y.o. [id number] at 5240w2d presents complaining of Contractions .  She states she has had contractions every 2 hours that hurt; had a gush of fluid when she was walking a few hours ago, no leaking since, along with active fetal movement. Her cx has been 4cms for over a week   Obstetrical/Gynecological History: OB History   Grav Para Term Preterm Abortions TAB SAB Ect Mult Living   3 2 1 1  0 0 0 0 0 2     Past Medical History: Past Medical History  Diagnosis Date  . Asthma   . Asthma   . Preterm labor     1st pregnancy d/t pre-e  . Pregnancy induced hypertension     previous pregnancy  . Pyelonephritis     June 2013  . Varicose veins     Past Surgical History: Past Surgical History  Procedure Laterality Date  . No past surgeries      Family History: Family History  Problem Relation Age of Onset  . Anesthesia problems Neg Hx   . Hypotension Neg Hx   . Malignant hyperthermia Neg Hx   . Pseudochol deficiency Neg Hx   . Other Neg Hx   . Diabetes Mother   . Asthma Father     Social History: History  Substance Use Topics  . Smoking status: Former Games developermoker  . Smokeless tobacco: Never Used     Comment: yrs ago  . Alcohol Use: No     Comment: a little when not pregnant    Allergies:  Allergies  Allergen Reactions  . Iohexol Hives and Shortness Of Breath     Code: SOB, Desc: IMMEDIATELY SOB FOLLOWING IV INJECTION, SPOTTY HIVES, PT GIVEN BENADRYL AND EPI-ARS 08/22/09, Onset Date: [id number]     Meds:  Prescriptions prior to admission  Medication Sig Dispense Refill  . acetaminophen (TYLENOL) 325 MG tablet Take 650 mg by mouth every 6 (six) hours as needed for mild pain, fever or headache.      . hydrocortisone (ANUSOL-HC) 25 MG suppository Place 25 mg rectally 2 (two) times daily as needed for hemorrhoids or itching.      . Prenatal Vit-Fe Fumarate-FA (PRENATAL MULTIVITAMIN) TABS  Take 1 tablet by mouth daily at 12 noon.        Review of Systems   Constitutional: Negative for fever and chills Eyes: Negative for visual disturbances Respiratory: Negative for shortness of breath, dyspnea Cardiovascular: Negative for chest pain or palpitations  Gastrointestinal: Negative for vomiting, diarrhea and constipation Genitourinary: Negative for dysuria and urgency Musculoskeletal: Negative for back pain, joint pain, myalgias  Neurological: Negative for dizziness and headaches     Physical Exam  Blood pressure 112/53, pulse 81, temperature 97.6 F (36.4 C), temperature source Oral, resp. rate 20, height 5\' 1"  (1.549 m), weight 66.849 kg (147 lb 6 oz), last menstrual period 04/30/2013. GENERAL: Well-developed, well-nourished female in no acute distress.  LUNGS: Clear to auscultation bilaterally.  HEART: Regular rate and rhythm. ABDOMEN: Soft, nontender, nondistended, gravid.  EXTREMITIES: Nontender, no edema, 2+ distal pulses. DTR's 2+ SSE:  No pooling, negative fern and valsalva. Normal appearing discharge CERVICAL EXAM: Dilatation 4cm   Effacement 60%   Station -2   Presentation: cephalic FHT:  Baseline rate 120 bpm   Variability moderate  Accelerations present   Decelerations none Contractions: rare   Labs: Results for orders placed during the hospital encounter  of 02/20/14 (from the past 24 hour(s))  POCT FERN TEST   Collection Time    02/20/14  9:48 PM      Result Value Ref Range   POCT Fern Test Negative = intact amniotic membranes     Imaging Studies:  No results found.  Assessment: Cathy Nash is  33 y.o. [id number] at [redacted]w[redacted]d presents with no evidence of ROM; rare contraction.  Plan: D/C home with labor precautions  CRESENZO-DISHMAN,Skai Lickteig 3/12/201510:31 PM

## 2014-02-21 ENCOUNTER — Inpatient Hospital Stay (HOSPITAL_COMMUNITY)
Admission: AD | Admit: 2014-02-21 | Discharge: 2014-02-22 | DRG: 775 | Disposition: A | Discharge status: Home / Self Care | Payer: Medicaid Other | Source: Ambulatory Visit | Attending: Family Medicine | Admitting: Family Medicine

## 2014-02-21 ENCOUNTER — Encounter (HOSPITAL_COMMUNITY): Payer: Self-pay | Admitting: Obstetrics

## 2014-02-21 DIAGNOSIS — J45909 Unspecified asthma, uncomplicated: Secondary | ICD-10-CM | POA: Diagnosis present

## 2014-02-21 DIAGNOSIS — O09899 Supervision of other high risk pregnancies, unspecified trimester: Secondary | ICD-10-CM

## 2014-02-21 DIAGNOSIS — O0990 Supervision of high risk pregnancy, unspecified, unspecified trimester: Secondary | ICD-10-CM

## 2014-02-21 DIAGNOSIS — IMO0001 Reserved for inherently not codable concepts without codable children: Secondary | ICD-10-CM

## 2014-02-21 DIAGNOSIS — O09299 Supervision of pregnancy with other poor reproductive or obstetric history, unspecified trimester: Secondary | ICD-10-CM

## 2014-02-21 DIAGNOSIS — O9989 Other specified diseases and conditions complicating pregnancy, childbirth and the puerperium: Principal | ICD-10-CM

## 2014-02-21 DIAGNOSIS — O99892 Other specified diseases and conditions complicating childbirth: Secondary | ICD-10-CM

## 2014-02-21 DIAGNOSIS — O4700 False labor before 37 completed weeks of gestation, unspecified trimester: Secondary | ICD-10-CM

## 2014-02-21 DIAGNOSIS — O09219 Supervision of pregnancy with history of pre-term labor, unspecified trimester: Secondary | ICD-10-CM

## 2014-02-21 DIAGNOSIS — Z87891 Personal history of nicotine dependence: Secondary | ICD-10-CM

## 2014-02-21 LAB — CBC
HEMATOCRIT: 32.8 % — AB (ref 36.0–46.0)
HEMOGLOBIN: 10.8 g/dL — AB (ref 12.0–15.0)
MCH: 29.3 pg (ref 26.0–34.0)
MCHC: 32.9 g/dL (ref 30.0–36.0)
MCV: 88.9 fL (ref 78.0–100.0)
Platelets: 272 10*3/uL (ref 150–400)
RBC: 3.69 MIL/uL — AB (ref 3.87–5.11)
RDW: 14.3 % (ref 11.5–15.5)
WBC: 8.7 10*3/uL (ref 4.0–10.5)

## 2014-02-21 LAB — TYPE AND SCREEN
ABO/RH(D): O POS
Antibody Screen: NEGATIVE

## 2014-02-21 LAB — RPR: RPR Ser Ql: NONREACTIVE

## 2014-02-21 MED ORDER — PRENATAL MULTIVITAMIN CH
1.0000 | ORAL_TABLET | Freq: Every day | ORAL | Status: DC
  Administered 2014-02-22: 1 via ORAL
  Filled 2014-02-21: qty 1

## 2014-02-21 MED ORDER — LACTATED RINGERS IV SOLN
INTRAVENOUS | Status: DC
  Administered 2014-02-21: 04:00:00 via INTRAVENOUS
  Administered 2014-02-21: 125 mL/h via INTRAVENOUS

## 2014-02-21 MED ORDER — LANOLIN HYDROUS EX OINT: TOPICAL_OINTMENT | CUTANEOUS | Status: DC | PRN

## 2014-02-21 MED ORDER — OXYCODONE-ACETAMINOPHEN 5-325 MG PO TABS
1.0000 | ORAL_TABLET | ORAL | Status: DC | PRN
  Administered 2014-02-21: 1 via ORAL
  Filled 2014-02-21 (×2): qty 1

## 2014-02-21 MED ORDER — ONDANSETRON HCL 4 MG PO TABS: 4.0000 mg | ORAL_TABLET | ORAL | Status: DC | PRN

## 2014-02-21 MED ORDER — TETANUS-DIPHTH-ACELL PERTUSSIS 5-2.5-18.5 LF-MCG/0.5 IM SUSP
0.5000 mL | Freq: Once | INTRAMUSCULAR | Status: DC
Start: 2014-02-22 — End: 2014-02-22

## 2014-02-21 MED ORDER — TERBUTALINE SULFATE 1 MG/ML IJ SOLN: 0.2500 mg | Freq: Once | INTRAMUSCULAR | Status: DC | PRN

## 2014-02-21 MED ORDER — DIBUCAINE 1 % RE OINT: 1.0000 "application " | TOPICAL_OINTMENT | RECTAL | Status: DC | PRN

## 2014-02-21 MED ORDER — SENNOSIDES-DOCUSATE SODIUM 8.6-50 MG PO TABS
2.0000 | ORAL_TABLET | ORAL | Status: DC
  Administered 2014-02-21: 2 via ORAL
  Filled 2014-02-21: qty 2

## 2014-02-21 MED ORDER — OXYTOCIN 40 UNITS IN LACTATED RINGERS INFUSION - SIMPLE MED
62.5000 mL/h | INTRAVENOUS | Status: DC
  Filled 2014-02-21: qty 1000

## 2014-02-21 MED ORDER — ZOLPIDEM TARTRATE 5 MG PO TABS: 5.0000 mg | ORAL_TABLET | Freq: Every evening | ORAL | Status: DC | PRN

## 2014-02-21 MED ORDER — WITCH HAZEL-GLYCERIN EX PADS: 1.0000 "application " | MEDICATED_PAD | CUTANEOUS | Status: DC | PRN

## 2014-02-21 MED ORDER — SIMETHICONE 80 MG PO CHEW: 80.0000 mg | CHEWABLE_TABLET | ORAL | Status: DC | PRN

## 2014-02-21 MED ORDER — ACETAMINOPHEN 325 MG PO TABS: 650.0000 mg | ORAL_TABLET | Freq: Four times a day (QID) | ORAL | Status: DC | PRN

## 2014-02-21 MED ORDER — HYDROCORTISONE ACETATE 25 MG RE SUPP
25.0000 mg | Freq: Two times a day (BID) | RECTAL | Status: DC | PRN
  Filled 2014-02-21: qty 1

## 2014-02-21 MED ORDER — ONDANSETRON HCL 4 MG/2ML IJ SOLN: 4.0000 mg | INTRAMUSCULAR | Status: DC | PRN

## 2014-02-21 MED ORDER — FENTANYL CITRATE 0.05 MG/ML IJ SOLN
100.0000 ug | INTRAMUSCULAR | Status: DC | PRN
  Administered 2014-02-21 (×3): 100 ug via INTRAVENOUS
  Filled 2014-02-21 (×3): qty 2

## 2014-02-21 MED ORDER — LIDOCAINE HCL (PF) 1 % IJ SOLN
30.0000 mL | INTRAMUSCULAR | Status: DC | PRN
  Filled 2014-02-21: qty 30

## 2014-02-21 MED ORDER — ONDANSETRON HCL 4 MG/2ML IJ SOLN: 4.0000 mg | Freq: Four times a day (QID) | INTRAMUSCULAR | Status: DC | PRN

## 2014-02-21 MED ORDER — IBUPROFEN 600 MG PO TABS
600.0000 mg | ORAL_TABLET | Freq: Four times a day (QID) | ORAL | Status: DC
  Administered 2014-02-21 – 2014-02-22 (×3): 600 mg via ORAL
  Filled 2014-02-21 (×4): qty 1

## 2014-02-21 MED ORDER — IBUPROFEN 600 MG PO TABS
600.0000 mg | ORAL_TABLET | Freq: Four times a day (QID) | ORAL | Status: DC | PRN
  Administered 2014-02-21: 600 mg via ORAL
  Filled 2014-02-21 (×2): qty 1

## 2014-02-21 MED ORDER — DIPHENHYDRAMINE HCL 25 MG PO CAPS: 25.0000 mg | ORAL_CAPSULE | Freq: Four times a day (QID) | ORAL | Status: DC | PRN

## 2014-02-21 MED ORDER — CITRIC ACID-SODIUM CITRATE 334-500 MG/5ML PO SOLN: 30.0000 mL | ORAL | Status: DC | PRN

## 2014-02-21 MED ORDER — BENZOCAINE-MENTHOL 20-0.5 % EX AERO: 1.0000 "application " | INHALATION_SPRAY | CUTANEOUS | Status: DC | PRN

## 2014-02-21 MED ORDER — OXYCODONE-ACETAMINOPHEN 5-325 MG PO TABS
1.0000 | ORAL_TABLET | ORAL | Status: DC | PRN
  Administered 2014-02-21: 1 via ORAL

## 2014-02-21 MED ORDER — LACTATED RINGERS IV SOLN: 500.0000 mL | INTRAVENOUS | Status: DC | PRN

## 2014-02-21 MED ORDER — ACETAMINOPHEN 325 MG PO TABS: 650.0000 mg | ORAL_TABLET | ORAL | Status: DC | PRN

## 2014-02-21 MED ORDER — OXYTOCIN BOLUS FROM INFUSION: 500.0000 mL | INTRAVENOUS | Status: DC

## 2014-02-21 MED ORDER — PNEUMOCOCCAL VAC POLYVALENT 25 MCG/0.5ML IJ INJ
0.5000 mL | INJECTION | INTRAMUSCULAR | Status: AC
  Administered 2014-02-22: 0.5 mL via INTRAMUSCULAR
  Filled 2014-02-21: qty 0.5

## 2014-02-21 MED ORDER — OXYTOCIN 40 UNITS IN LACTATED RINGERS INFUSION - SIMPLE MED
1.0000 m[IU]/min | INTRAVENOUS | Status: DC
  Administered 2014-02-21: 2 m[IU]/min via INTRAVENOUS

## 2014-02-21 NOTE — MAU Provider Note (Signed)
Attestation of Attending Supervision of Advanced Practitioner (PA/CNM/NP): Evaluation and management procedures were performed by the Advanced Practitioner under my supervision and collaboration.  I have reviewed the Advanced Practitioner's note and chart, and I agree with the management and plan.  Nazim Kadlec, MD, FACOG Attending Obstetrician & Gynecologist Faculty Practice, [facility]  

## 2014-02-21 NOTE — H&P (Signed)
Cathy Nash is a 33 y.o. female presenting for spontaneous onset of labor. Maternal Medical History:  Reason for admission: Contractions.   Contractions: Onset was 6-12 hours ago.   Frequency: irregular.    Fetal activity: Perceived fetal activity is normal.   Last perceived fetal movement was within the past hour.    Prenatal complications: Preterm labor.     OB History   Grav Para Term Preterm Abortions TAB SAB Ect Mult Living   3 2 1 1  0 0 0 0 0 2     Past Medical History  Diagnosis Date  . Asthma   . Asthma   . Preterm labor     1st pregnancy d/t pre-e  . Pregnancy induced hypertension     previous pregnancy  . Pyelonephritis     June 2013  . Varicose veins    Past Surgical History  Procedure Laterality Date  . No past surgeries     Family History: family history includes Asthma in her father; Diabetes in her mother. There is no history of Anesthesia problems, Hypotension, Malignant hyperthermia, Pseudochol deficiency, or Other. Social History:  reports that she has quit smoking. She has never used smokeless tobacco. She reports that she does not drink alcohol or use illicit drugs.   Prenatal Transfer Tool  Maternal Diabetes: No Genetic Screening: Normal Maternal Ultrasounds/Referrals: Normal Fetal Ultrasounds or other Referrals:  None Maternal Substance Abuse:  No Significant Maternal Medications:  None Significant Maternal Lab Results:  None Other Comments:  None  Review of Systems  Constitutional: Negative.   HENT: Negative.   Eyes: Negative.   Respiratory: Negative.   Cardiovascular: Negative.   Gastrointestinal: Positive for abdominal pain.  Genitourinary: Negative.   Musculoskeletal: Negative.   Skin: Negative.   Neurological: Negative.   Endo/Heme/Allergies: Negative.   Psychiatric/Behavioral: Negative.     Dilation: 5 Effacement (%): 70 Station: -1 Exam by:: V Smith CNM Last menstrual period 04/30/2013. Maternal Exam:  Uterine  Assessment: Contraction strength is moderate.  Contraction frequency is regular.   Abdomen: Patient reports generalized tenderness.  Fetal presentation: vertex  Introitus: Normal vulva. Vagina is positive for vaginal discharge.  Ferning test: not done.  Nitrazine test: not done. Amniotic fluid character: not assessed.  Pelvis: adequate for delivery.   Cervix: Cervix evaluated by digital exam.     Fetal Exam Fetal Monitor Review: Mode: ultrasound.   Baseline rate: 120.  Pattern: accelerations present and no decelerations.    Fetal State Assessment: Category I - tracings are normal.     Physical Exam  Constitutional: She is oriented to person, place, and time. She appears well-developed and well-nourished.  HENT:  Head: Normocephalic and atraumatic.  Eyes: Conjunctivae are normal.  Neck: Normal range of motion.  Cardiovascular: Normal rate and regular rhythm.   Respiratory: Effort normal and breath sounds normal.  GI: Soft. There is generalized tenderness.  Genitourinary: Vaginal discharge found.  Musculoskeletal: Normal range of motion.  Neurological: She is alert and oriented to person, place, and time.  Skin: Skin is warm and dry.  Psychiatric: She has a normal mood and affect. Her behavior is normal. Thought content normal.    Prenatal labs: ABO, Rh: --/--/O POS (10/15 2358) Antibody: Negative (10/20 0000) Rubella:  Immune RPR: NON REACTIVE (03/10 1725)  HBsAg:   Negative HIV: NON REACTIVE (01/08 1148)  GBS: Negative (03/05 0000)   Assessment/Plan: 33 yo [id number] with SIUP @[redacted]w[redacted]d  Active Labor  Admit to birthing suites    Selena Lesser 02/21/2014,  3:58 AM  I have seen and examined this patient and agree the above assessment. CRESENZO-DISHMAN,Renne Cornick 02/21/2014 8:52 AM

## 2014-02-21 NOTE — H&P (Signed)
Attestation of Attending Supervision of Advanced Practitioner (PA/CNM/NP): Evaluation and management procedures were performed by the Advanced Practitioner under my supervision and collaboration.  I have reviewed the Advanced Practitioner's note and chart, and I agree with the management and plan.  Tamu Golz, MD, FACOG Attending Obstetrician & Gynecologist Faculty Practice, [facility]  

## 2014-02-22 MED ORDER — IBUPROFEN 600 MG PO TABS: 600.0000 mg | ORAL_TABLET | Freq: Four times a day (QID) | ORAL | Status: DC | PRN

## 2014-02-22 NOTE — Discharge Instructions (Signed)
Cuidados en el postparto luego de un parto vaginal  (Postpartum Care After Vaginal Delivery) Despus del parto (perodo de postparto), la estada normal en el hospital es de 24-72 horas. Si hubo problemas con el trabajo de parto o el parto, o si tiene otros problemas mdicos, es posible que Patent attorney en el hospital por ms [place].  Mientras est en el hospital, recibir [place] e instrucciones sobre cmo cuidar de usted misma y de su beb recin nacido durante el postparto.  Mientras est en el hospital:   Asegrese de decirle a las enfermeras si siente dolor o Tree surgeon, as como donde Designer, television/film set y Architect.  Si usted tuvo una incisin cerca de la vagina (episiotoma) o si ha tenido Education officer, museum, las enfermeras le pondrn hielo sobre la episiotoma o Psychiatrist. Las bolsas de hielo pueden ayudar a Dietitian y la hinchazn.  Si est amamantando, puede sentir contracciones dolorosas en el tero durante algunas semanas. Esto es normal. Las contracciones ayudan a que el tero vuelva a su tamao normal.  Es normal tener algo de sangrado despus del Placedo.  Durante los primeros 1-3 das despus del parto, el flujo es de color rojo y la cantidad puede ser similar a un perodo.  Es frecuente que el flujo se inicie y se Production assistant, radio.  En los primeros [place], puede eliminar algunos cogulos pequeos. Informe a las enfermeras si elimina cogulos grandes o aumenta el flujo.  No  elimine los cogulos de sangre por el inodoro antes de que la [employer] Mining vea.  Durante los prximos 3 a [address] despus del parto, el flujo debe ser ms acuoso y rosado o Forensic psychologist.  Chancy Hurter a catorce [employer]cker del parto, el flujo debe ser una pequea cantidad de secrecin de color blanco amarillento.  La cantidad de flujo disminuir en las primeras semanas despus del parto. El flujo puede detenerse en 6-8 semanas. La mayora de las mujeres no tienen ms flujo a las 12 semanas  despus del [place].  Usted debe cambiar sus apsitos con frecuencia.  Lvese bien las manos con agua y jabn durante al menos 20 segundos despus de cambiar el apsito, usar el bao o antes de Nature conservation officer o Research scientist (life sciences) a su recin nacido.  Usted podr sentir como que tiene que vaciar la vejiga durante las primeras 6-8 horas despus del [place].  En caso de que sienta debilidad, mareo o [place], llame a la enfermera antes de levantarse de la cama por primera vez y antes de tomar una ducha por primera vez.  Dentro de los Coca-Cola del parto, sus mamas pueden comenzar a estar sensibles y [place]. Esto se llama congestin. La sensibilidad en los senos por lo general desaparece dentro de las 48-72 horas despus de que ocurre la congestin. Tambin puede notar que la [place] se escapa de sus senos. Si no est amamantando no estimule sus pechos. La estimulacin de las mamas hace que sus senos produzcan ms [place].  Pasar tanto tiempo como le sea posible con el beb recin nacido es muy importante. Durante ese tiempo, usted y su beb deben sentirse cerca y conocerse uno al otro. Tener al beb en su habitacin (alojamiento conjunto) ayudar a fortalecer el vnculo con el beb recin nacido.Esto le dar tiempo para conocerlo y atenderlo de [place] cmoda.  Las hormonas se modifican despus del parto. A veces, los cambios hormonales pueden causar tristeza o ganas de llorar por un tiempo. Estos sentimientos  no deben durar ms de Hughes Supplyunos pocos das. Si duran ms que eso, debe hablar con su mdico.  Si lo desea, hable con su mdico acerca de los mtodos de planificacin familiar o mtodos anticonceptivos.  Hable con su mdico acerca de las vacunas. El mdico puede indicarle que se aplique las siguientes vacunas antes de salir del hospital:  [place] and PrincipeVacuna contra el ttanos, la difteria y la tos ferina (Tdap) o el ttanos y la difteria (Td). Es muy importante que usted y su familia (incluyendo a los abuelos) u otras  personas que cuidan al recin nacido estn al da con las vacunas Tdap o Td. Las vacunas Tdap o Td pueden ayudar a proteger al recin nacido de enfermedades.  Inmunizacin contra la rubola.  Inmunizacin contra la varicela.  Inmunizacin contra la gripe. Usted debe recibir esta vacunacin anual si no la ha recibido Academic librariandurante el embarazo. Document Released: 09/25/2007 Document Revised: 08/22/2012 [facility] Patient Information 2014 [place]ExitCare, MarylandLLC.  Lactancia materna (Breastfeeding) Decidir Museum/gallery exhibitions officeramamantar es una de las mejores elecciones que puede hacer por usted y su beb. El cambio hormonal durante el Psychiatristembarazo produce el desarrollo del tejido mamario y Lesothoaumenta la cantidad y el tamao de los conductos galactforos. Estas hormonas tambin permiten que las protenas, los azcares y las grasas de la sangre produzcan la WPS Resourcesleche materna en las glndulas productoras de Euniceleche. Las hormonas impiden que la leche materna sea liberada antes del nacimiento del beb, adems de impulsar el flujo de leche luego del nacimiento. Una vez que ha comenzado a Museum/gallery exhibitions officeramamantar, Conservation officer, naturepensar en el beb, as Immunologistcomo la succin o Theatre managerel llanto, pueden estimular la liberacin de Country Clubleche de las glndulas productoras de Soda Springsleche.  LOS BENEFICIOS DE AMAMANTAR Para el beb  La primera leche (calostro) ayuda al mejor funcionamiento del sistema digestivo del beb.  La leche tiene anticuerpos que ayudan a Radio producerprevenir las infecciones en el beb.  El beb tiene una menor incidencia de asma, alergias y del sndrome de muerte sbita del lactante.  Los nutrientes en la Portlandleche materna son mejores para el beb que la Newingtonleche maternizada y estn preparados exclusivamente para cubrir las necesidades del beb.  La leche materna mejora el desarrollo cerebral del beb.  Es menos probable que el beb desarrolle otras enfermedades, como obesidad infantil, asma o diabetes mellitus de tipo 2. Para usted   La lactancia materna favorece el desarrollo de un vnculo muy especial  entre la madre y el beb.  Es conveniente. Siempre est disponible a la temperatura correcta y es Boydeconmica.  La lactancia materna ayuda a quemar caloras y a perder el peso ganado durante el Somersetembarazo.  Favorece la contraccin del tero al tamao que tena antes del embarazo de manera ms rpida y disminuye el sangrado (loquios) despus del parto.  La lactancia materna contribuye a reducir Nurse, adultel riesgo de desarrollar diabetes mellitus de tipo 2, osteoporosis o cncer de mama o de ovario en el futuro. SIGNOS DE QUE EL BEB EST HAMBRIENTO Primeros signos de [address]re  Aumenta su estado de Lesothoalerta o actividad.  Se estira.  Mueve la cabeza de un lado a otro.  Mueve la cabeza y abre la boca cuando se le toca la mejilla o la comisura de la boca (reflejo de bsqueda).  Aumenta las vocalizaciones, tales como sonidos de succin, se relame los labios, emite arrullos, suspiros, o chirridos.  Mueve la Jones Apparel Groupmano hacia la boca.  Se chupa con ganas los dedos o las manos. Signos tardos de Fisher Scientifichambre  Est agitado.  Ninfa LindenLlora de manera  intermitente. Signos de AES Corporation signos de hambre extrema requerirn que lo calme y lo consuele antes de que el beb pueda alimentarse adecuadamente. No espere a que se manifiesten los siguientes signos de hambre extrema para comenzar a Museum/gallery exhibitions officer:   Designer, jewellery.  Llanto intenso y fuerte.   Gritos. INFORMACIN BSICA SOBRE LA LACTANCIA MATERNA Iniciacin de la lactancia materna  Encuentre un lugar cmodo para sentarse o acostarse, con un buen respaldo para el cuello y la espalda.  Coloque una almohada o una manta enrollada debajo del beb para acomodarlo a la altura de la mama (si est sentada). Las almohadas para Museum/gallery exhibitions officer se han diseado especialmente a fin de servir de apoyo para los brazos y el beb [employer] Foods.  Asegrese de que el abdomen del beb est frente al suyo.  Masajee suavemente la mama. Con las yemas de los dedos, masajee la pared del pecho  hacia el pezn en un movimiento circular. Esto estimula el flujo de [place]. Es posible que Engineer, manufacturing systems este movimiento mientras amamanta si la leche fluye lentamente.  Sostenga la mama con el pulgar por arriba del pezn y los otros 4 dedos por debajo de la mama. Asegrese de que los dedos se encuentren lejos del pezn y de la boca del beb.  Empuje suavemente los labios del beb con el pezn o con el dedo.  Cuando la boca del beb se abra lo suficiente, acrquelo rpidamente a la mama e introduzca todo el pezn y la zona oscura que lo rodea (areola), tanto como sea posible, dentro de la boca del beb.  Debe haber ms areola visible por arriba del labio superior del beb que por debajo del labio inferior.  La lengua del beb debe estar entre la enca inferior y la [place].  Asegrese de que la boca del beb est en la posicin correcta alrededor del pezn (prendida). Los labios del beb deben crear un sello sobre la mama, doblndose hacia afuera (invertidos).  Es comn que el beb succione durante 2 a 3 minutos para que comience el flujo de [place]. Cmo debe prenderse Es muy importante que le ensee al beb cmo prenderse adecuadamente a la mama. Si el beb no se prende adecuadamente, puede causarle dolor en el pezn y reducir la produccin de [place], y hacer que el beb tenga un escaso aumento de [place]. Adems, si el beb no se prende adecuadamente al pezn, puede tragar aire durante la alimentacin. Esto puede causarle molestias al beb. Hacer eructar al beb al [place] de mama puede ayudarlo a liberar el aire. Sin embargo, ensearle al beb cmo prenderse a la mama adecuadamente es la mejor manera de evitar que se sienta molesto por tragar Oceanographer se alimenta. Signos de que el beb se ha prendido adecuadamente al pezn:   Payton Doughty o succiona de modo silencioso, sin causarle dolor.  Se escucha que traga cada 3 o 4 succiones.   Hay movimientos musculares por arriba y por  delante de sus odos al Printmaker. Signos de que el beb no se ha prendido Audiological scientist al pezn:   Hace ruidos de succin o de chasquido mientras se alimenta.  Dolor en el pezn. Si cree que el beb no se prendi correctamente, deslice el dedo en la comisura de la boca y Ameren Corporation las encas del beb para interrumpir la succin. Intente comenzar a amamantar nuevamente. Signos de Fish farm manager Signos del beb:   Disminucin gradual en el nmero de succiones o cese completo de la  succin.  Se duerme.  Relaja el cuerpo.  Retiene una pequea cantidad de Battlement Mesaleche en su boca.  Se desprende solo del pecho. Signos que presenta usted:  Las mamas han aumentado la firmeza, el peso y el tamao 1 a 3 horas despus de Museum/gallery exhibitions officeramamantar.  Estn ms blandas inmediatamente despus de amamantar.  Un aumento del volumen de Sunrayleche, y tambin el cambio de su consistencia y color se producen hacia el quinto da de Tour managerlactancia materna.  Los pezones no duelen, ni estn agrietados ni sangran. Signos de que su beb recibe la cantidad de leche suficiente  Moja al menos 3 paales en 24 horas. La orina debe ser clara y de color amarillo plido a los 5 [address]  [address] de [place]vida.  Defeca al menos 3 veces en 24 horas a los [address]  [address] de [address]. La materia fecal debe ser blanda y Onidaamarillenta.  Defeca al menos 3 veces en 24 horas a los [address] das de [address]. La materia fecal debe ser grumosa y Marseillesamarillenta.  No registra una prdida de peso mayor del 10% del peso al nacer durante los primeros 3 [address]  [address]das de [place]vida.  Aumenta de peso un promedio de 4 a 7onzas (120 a 210ml) por semana despus de los 4 [address]  [address] de vida.  Aumenta de Lawrencepeso, Garwooddiariamente, de Mount Victorymanera consistente a Glass blower/designerpartir de los 5 [address]  [address] de vida, sin Passenger transport managerregistrar prdida de peso despus de las 2 semanas de vida. Despus de alimentarse, es posible que el beb regurgite una pequea cantidad. Esto es frecuente. FRECUENCIA Y DURACIN DE LA LACTANCIA MATERNA El amamantamiento frecuente la  ayudar a producir ms Azerbaijanleche y a Education officer, communityprevenir problemas de Engineer, miningdolor en los pezones e hinchazn en las [place] Dum[place]stonmamas. Alimente al beb cuando muestre signos de hambre o si siente la necesidad de reducir la congestin de las Johnson Citymamas. Esto se denomina "lactancia a demanda". Evite el uso del chupete mientras trabaja para establecer la lactancia (las primeras 4 a 6 semanas despus del nacimiento del beb). Despus de este perodo, podr ofrecerle un chupete. Las investigaciones demostraron que el uso del chupete durante el primer ao de vida del beb disminuye el riesgo de desarrollar el sndrome de muerte sbita del lactante (SMSL). Permita que el nio se alimente en cada mama todo lo que desee. Contine amamantando al beb hasta que haya terminado de alimentarse. Cuando el beb se desprende o se queda dormido mientras se est alimentando de la primera mama, ofrzcale la segunda. Debido a que, con frecuencia, los recin Sunoconacidos permanecen somnolientos las primeras semanas de vida, es posible que deba despertar a su beb para alimentarlo. Los horarios de Acupuncturistlactancia varan de un beb a otro. Sin embargo, las siguientes reglas pueden servir como gua para ayudarle a Lawyergarantizar que el beb se alimenta adecuadamente:  Se puede amamantar a los recin nacidos (bebs de 4 semanas o menos de vida) cada 1 a 3 horas.  No deben transcurrir ms de 3 horas durante el da o 5 horas durante la noche sin que se amamante a los recin nacidos.  Debe amamantar al beb 8 veces como mnimo, en un perodo de 24 horas, hasta que comience a introducir slidos en su dieta, a los 6 meses de vida aproximadamente. EXTRACCIN DE Dean Foods CompanyLECHE MATERNA La extraccin y Contractorel almacenamiento de la leche materna le permiten asegurarse de que el beb se alimente exclusivamente de Naturitaleche materna, aun en momentos en los que no puede amamantar. Esto tiene especial importancia si debe regresar al Aleen Campitrabajo en el perodo en que an est amamantando  o si no puede estar presente en  los momentos en que el beb debe alimentarse. Su asesor en lactancia puede orientarla sobre cunto tiempo es seguro almacenar [place].  El sacaleche es un aparato que le permite extraer leche de la mama a un recipiente estril. Luego, la leche materna extrada puede almacenarse en un refrigerador o freezer. Algunos sacaleches son Birdie Riddle, Delaney Meigs otros son elctricos. Consulte a su asesor en lactancia qu tipo ser ms conveniente para usted. Los sacaleches se pueden comprar, sin embargo, algunos hospitales y grupos de apoyo a la lactancia materna alquilan Sports coach. Un asesor en lactancia puede ensearle cmo extraer W. [employer]. [employer]kley, en caso de que prefiera no usar un sacaleche.  CMO CUIDAR LAS MAMAS DURANTE LA LACTANCIA MATERNA Los pezones se secan, agrietan y duelen durante la Tour manager. Las siguientes recomendaciones pueden ayudarle a Pharmacologist las TEPPCO Partners y sanas:  Careers information officer usar jabn en los pezones.  Use un sostn de soporte. Aunque no son esenciales, las camisetas sin mangas o los sostenes especiales para Museum/gallery exhibitions officer estn diseados para acceder fcilmente a las mamas, para Museum/gallery exhibitions officer sin tener que quitarse todo el sostn o la camiseta. Evite usar sostenes con aro o sostenes muy ajustados.  Seque al aire sus pezones durante 3 a despus de amamantar al beb.  Utilice solo apsitos de Haematologist sostn para Environmental health practitioner las prdidas de [place]. La prdida de un poco de Public Service Enterprise Group tomas es normal.  Utilice lanolina sobre los pezones luego de Museum/gallery exhibitions officer. La lanolina ayuda a mantener la humedad normal de la piel. Si [place] lanolina pura, no tiene que lavarse los pezones antes de Corporate treasurer al beb. La lanolina pura no es txica para el beb. Adems, puede extraer [employer] Homes algunas gotas de [place] materna y Engineer, maintenance (IT) suavemente esa Winn[employer]ie, para que la [place] Ford se seque al aire. Durante las primeras semanas despus de dar a  luz, algunas mujeres pueden experimentar hinchazn en las mamas (congestin [place]). La congestin puede hacer que sienta las mamas pesadas, calientes y sensibles al tacto. El pico de la congestin ocurre dentro de los 3 a 5 das despus del [place]rochales. Las siguientes recomendaciones pueden ayudarle a Paramedic la congestin:  Vace por completo las mamas al QUALCOMM o Environmental health practitioner. Puede aplicar calor hmedo en las mamas (en la ducha o con toallas hmedas para manos) antes de Museum/gallery exhibitions officer o extraer WPS Resources. Esto aumenta la circulacin y [place] a que la [place]. Si el beb no vaca por completo las [address] cuando lo [address], extraiga la [place] restante despus de que haya finalizado.  Use un sostn ajustado (para amamantar o comn) o camiseta sin mangas durante 1 o 2 das para indicar al cuerpo que disminuya ligeramente la produccin de [place].  Aplique compresas de hielo Yahoo! Inc, a menos que le resulte demasiado incmodo.  Asegrese de que el beb se encuentre en la posicin correcta mientras lo alimenta. Si la congestin persiste luego de 48 horas o despus de seguir estas recomendaciones, comunquese con su mdico o un Holiday representative. RECOMENDACIONES GENERALES PARA EL CUIDADO DE LA SALUD DURANTE LA LACTANCIA MATERNA  Consuma alimentos saludables. Alterne comidas y colaciones, comiendo 3 de cada Agricultural engineer. Dado que lo que come Danaher Corporation, es posible que algunas comidas hagan que su beb se vuelva ms irritable de lo habitual. Evite comer este tipo de alimentos, si percibe que afectan de manera negativa al beb.  Lowe's Companies, jugos de  fruta y agua para satisfacer su sed (aproximadamente 10 vasos al da).  Descanse con frecuencia, reljese y tome sus vitaminas prenatales para evitar la fatiga, el estrs y la anemia.  Contine con los autocontroles de la mama.  Evite masticar y fumar tabaco.  Evite el consumo de alcohol y drogas. Algunos medicamentos, que pueden ser  perjudiciales para el beb, pueden pasar a travs de la Colgate Palmolive. Es importante que consulte a su mdico antes de Medical sales representative, incluidos todos los medicamentos recetados y de [place], as como los suplementos vitamnicos y herbales. Puede quedar embarazada durante la lactancia. Si desea controlar la natalidad, consulte a su mdico cules son las opciones ms seguras para el beb. SOLICITE ATENCIN MDICA SI:   Usted siente que quiere dejar de Museum/gallery exhibitions officer o se siente frustrada con la lactancia.  Siente dolor en las mamas o en los pezones.  Sus pezones estn agrietados o Water quality scientist.  Sus pechos estn irritados, sensibles o calientes.  Tiene un rea hinchada en cualquiera de las mamas.  Siente escalofros o fiebre.  Tiene nuseas o vmitos.  Presenta una secrecin de otro lquido distinto de la leche materna de los pezones.  Sus mamas no se llenan antes de Museum/gallery exhibitions officer al beb para el 5. da despus del parto.  Se siente triste y deprimida.  El beb est demasiado somnoliento como para comer bien.  El beb tiene problemas para dormir.  Moja menos de 3 paales en 24 horas.  Defeca menos de 3 veces en 24 horas.  La piel del beb o la parte blanca de sus ojos est amarilla.  El beb no ha aumentado de [place] a los [address] de [place]. SOLICITE ATENCIN MDICA DE INMEDIATO SI:   El beb est muy cansado (aletargado) y no se despierta para comer.  Le sube la fiebre sin causa. Document Released: 11/28/2005 Document Revised: 03/25/2013 [facility] Patient Information 2014 [place], [place].  Extraccin de Press photographer materna con un Midwife  (Breast Pumping Tips)  La extraccin de la leche materna con sacaleche es una buena manera de estimular la produccin y Wilburt Finlay un suministro constante para su beb. Es muy til Energy Transfer Partners picos de Designer, jewellery, cuando est involucrado el padre o un familiar o cuando usted deba ausentarse. Hay varios tipos de sacaleche disponibles. Se pueden  adquirir en una tienda para bebs o de maternidad. Puede comenzar a Market researcher enseguida despus del parto, pero algunos expertos creen que debera esperar alrededor de cuatro semanas para darle un bibern al beb.  En general, cuanto ms se alimenta o ms leche se extrae, ms leche tendr para su beb. Es muy importante que se cuide a usted misma. Esto reducir Development worker, community y ayudar a su cuerpo a Warehouse manager un suministro saludable de [place]. El mdico o el especialista en lactancia le pueden dar informacin y apoyo en sus esfuerzos para Museum/gallery exhibitions officer a su beb.  EXTRACCIN DE Starbucks Corporation  Siga los siguientes consejos para una extraccin [place].  Cudese.  Beba gran cantidad de agua o lquido para mantener la orina de tono claro o color amarillo plido. Al amamantar podr notar que le aumenta la sed. Esto se debe a que el cuerpo necesita ms agua para producir la Colgate Palmolive. Tenga una botella de agua grande a [place]. Tome bebida sanas como jugos de fruta sin International aid/development worker, Alexis Goodell y Media. Limite los refrescos, el caf y el alcohol (espere 2 horas para Corporate treasurer o extraer la WPS Resources si ha tomado una bebida alcohlica.)  Consuma una dieta saludable y bien balanceada rica en frutas, verduras y granos enteros.  Realice actividad fsica segn las indicaciones del mdico.  Duerma lo suficiente. Duerma cuando el beb duerme. Pida ayuda a familiares o amigos si necesita tiempo para tomar un descanso o una siesta.  No fume. Fumar puede reducir la produccin de [place] y daar al beb. Si necesita ayuda para dejar de fumar, pdale a su mdico que le recomiende un programa.  Pregunte a su mdico acerca de las opciones de anticonceptivos. Las pldoras anticonceptivas pueden disminuir su suministro de [place]. Podrn aconsejarle que use condones u otras formas de control de la natalidad. Reljese y extraiga la AutoZone el reflejo de bajada es la clave para una extraccin exitosa y [place]. Hace que la 3M Company mejor  en todas partes de la mama.   Es ms fcil extraer la Colgate Palmolive (y Museum/gallery exhibitions officer) si est relajada. Busque las tcnicas que mejor le resulten. Un espacio privado tranquilo, un masaje en la mama, la aplicacin de calor en la mama, la [place], las fotografas y la [place] de su beb pueden ayudarla a Lexicographer y "bajar" la [place]. Si tiene dificultad para la [place], pruebe [address] de su beb o una prenda que haya usado, [place] se extrae la [place].  Para bombear, coloque la ventosa especial (brida) directamente sobre el pezn. Puede ser incmodo y provocar grietas del pezn si no est correctamente colocado o es el tamao incorrecto. La aplicacin de una pequea cantidad de lanolina purificada o modificada en el pezn y la areola puede ayudar a aumentar su nivel de confort. Adems, en muchas bombas elctricas se puede cambiar la velocidad y la succin a su nivel de comodidad. El mdico o el especialista en lactancia materna la ayudarn.  Si la extraccin sigue siendo dolorosa o siente que no obtiene buena cantidad de Freescale Semiconductor al bombear, es posible que necesite otro tipo de bomba. Un especialista en lactancia materna puede ayudarla a determinar si ste es el caso.  Si usted est con el beb, alimntelo a demanda y extraiga leche despus de cada comida. Esto aumentar su produccin, incluso si la leche no sale. Puede ser que no pueda extraer Tenet Healthcare al principio, pero mantenga la rutina, y esto va a Scientist, clinical (histocompatibility and immunogenetics).  Si trabaja o est lejos de su beb durante varias horas, trate de bombear durante 15 minutos cada 2 o 3 horas. Extraiga de ambos senos al mismo tiempo si es posible.  Si el beb sea alimenta con leche de frmula, asegrese de extraer la [employer] a la misma hora para Radio producer suministro.  Comience a extraer la [employer] algunas semanas antes de volver al [place]. Esto le ayudar a Biomedical scientist tcnica y podr [employer] extra.  Busque una fuente de informacin sobre  lactancia materna que le resulte til. CONSEJOS PARA EL ALMACENAMIENTO DE LA LECHE MATERNA   Guarde la [employer] materna en una bolsa de cierre hermtico, frasco o recipiente estril que le han provisto con los suministros de bombeo.  Almacene la [employer] en pequeas cantidades semejantes a las que el nio consume en cada alimentacin.  Enfre la [employer] extrada en un refrigerador o hielera. La leche extrada puede durar en la parte posterior del refrigerador durante 3 a 8 das.  Puede almacenar la [facility] parte posterior del freezer durante un mximo de 3 meses.  Descongele la [employer] en su recipiente o bolsa bajo el agua caliente hasta con 24 horas de  anticipacin. No use un microondas para descongelar o calentar [employer]. No vuelva a congelar la [employer] que ha sido descongelada.  La leche materna es segura para tomar si se la deja a Publishing rights manager (mid 70s o ms fro) durante 4 a 8 horas. Despus de eso, deschela.  La crema de la [place] puede separase y Doctor, general practice. El color puede variar ligeramente de un da a otro. Esto es normal. Siempre agite la [place] antes de [place], para Engineer, manufacturing la crema con la parte aguada. SOLICITE ATENCIN MDICA SI:   Tiene dificultad para extraer la leche o alimentar al beb.  Est preocupada porque no produce la cantidad suficiente de leche.  Siente dolor, hinchazn o inflamacin en los pezones.  Tiene otras preguntas o preocupaciones relacionadas con usted o con el beb. Document Released: 05/29/2012 [facility] Patient Information 2014 [place], [place].

## 2014-02-22 NOTE — Discharge Summary (Signed)
Attestation of Attending Supervision of Advanced Practitioner (CNM/NP): Evaluation and management procedures were performed by the Advanced Practitioner under my supervision and collaboration. I have reviewed the Advanced Practitioner's note and chart, and I agree with the management and plan.  Catheleen Langhorne H. 10:14 AM   

## 2014-02-22 NOTE — Discharge Summary (Signed)
Obstetric Discharge Summary Reason for Admission: onset of labor Prenatal Procedures: ultrasound Intrapartum Procedures: spontaneous vaginal delivery Postpartum Procedures: none Complications-Operative and Postpartum: none Eating, drinking, voiding, ambulating well.  +flatus.  Lochia and pain wnl.  Denies dizziness, lightheadedness, or sob. No complaints.  Plans for nexplanon  Hemoglobin  Date Value Ref Range Status  02/21/2014 10.8* 12.0 - 15.0 g/dL Final     HCT  Date Value Ref Range Status  02/21/2014 32.8* 36.0 - 46.0 % Final    Physical Exam:  General: alert, cooperative and no distress Lochia: appropriate Uterine Fundus: firm Incision: n/a DVT Evaluation: No evidence of DVT seen on physical exam. Negative Homan's sign. No cords or calf tenderness. No significant calf/ankle edema.  Discharge Diagnoses: Term Pregnancy-delivered  Discharge Information: Date: 02/22/2014 Activity: pelvic rest Diet: routine Medications: PNV and Ibuprofen Condition: stable Instructions: refer to practice specific booklet Discharge to: home Follow-up Information   Schedule an appointment as soon as possible for a [facility]. (4-6 weeks for your nexplanon and postpartum visit)    Specialty:  Obstetrics and Gynecology   Contact information:   [address] [place] [id number] [phone]      Newborn Data: Live born female  Birth Weight: 5 lb 10.8 oz (2575 g) APGAR: 8, 9  Home with mother. Breast/bottlefeeding Declines circumcision  Marge DuncansBooker, Mellody Masri Randall 02/22/2014, 9:41 AM

## 2014-02-26 NOTE — Progress Notes (Signed)
Post discharge ur review completed. 

## 2014-03-20 ENCOUNTER — Encounter (HOSPITAL_COMMUNITY): Payer: Self-pay | Admitting: Emergency Medicine

## 2014-03-20 ENCOUNTER — Emergency Department (HOSPITAL_COMMUNITY)
Admission: EM | Admit: 2014-03-20 | Discharge: 2014-03-20 | Disposition: A | Discharge status: Home / Self Care | Payer: Medicaid Other | Attending: Emergency Medicine | Admitting: Emergency Medicine

## 2014-03-20 DIAGNOSIS — R000 Tachycardia, unspecified: Secondary | ICD-10-CM | POA: Insufficient documentation

## 2014-03-20 DIAGNOSIS — Z87891 Personal history of nicotine dependence: Secondary | ICD-10-CM | POA: Insufficient documentation

## 2014-03-20 DIAGNOSIS — J45909 Unspecified asthma, uncomplicated: Secondary | ICD-10-CM | POA: Insufficient documentation

## 2014-03-20 DIAGNOSIS — Z3202 Encounter for pregnancy test, result negative: Secondary | ICD-10-CM | POA: Insufficient documentation

## 2014-03-20 DIAGNOSIS — R112 Nausea with vomiting, unspecified: Secondary | ICD-10-CM | POA: Insufficient documentation

## 2014-03-20 DIAGNOSIS — K2990 Gastroduodenitis, unspecified, without bleeding: Principal | ICD-10-CM

## 2014-03-20 DIAGNOSIS — K2970 Gastritis, unspecified, without bleeding: Secondary | ICD-10-CM

## 2014-03-20 DIAGNOSIS — R197 Diarrhea, unspecified: Secondary | ICD-10-CM | POA: Insufficient documentation

## 2014-03-20 LAB — BASIC METABOLIC PANEL
BUN: 8 mg/dL (ref 6–23)
CO2: 17 meq/L — AB (ref 19–32)
Calcium: 9.7 mg/dL (ref 8.4–10.5)
Chloride: 102 mEq/L (ref 96–112)
Creatinine, Ser: 0.62 mg/dL (ref 0.50–1.10)
GFR calc Af Amer: >90 mL/min (ref >90)
GLUCOSE: 110 mg/dL — AB (ref 70–99)
POTASSIUM: 3.9 meq/L (ref 3.7–5.3)
SODIUM: 138 meq/L (ref 137–147)

## 2014-03-20 LAB — URINALYSIS, ROUTINE W REFLEX MICROSCOPIC
Glucose, UA: NEGATIVE mg/dL
Ketones, ur: 15 mg/dL — AB
Nitrite: NEGATIVE
PH: 5.5 (ref 5.0–8.0)
Protein, ur: 100 mg/dL — AB
SPECIFIC GRAVITY, URINE: 1.026 (ref 1.005–1.030)
Urobilinogen, UA: 1 mg/dL (ref 0.0–1.0)

## 2014-03-20 LAB — HEPATIC FUNCTION PANEL
ALBUMIN: 4.3 g/dL (ref 3.5–5.2)
ALT: 29 U/L (ref 0–35)
AST: 39 U/L — AB (ref 0–37)
Alkaline Phosphatase: 197 U/L — ABNORMAL HIGH (ref 39–117)
TOTAL PROTEIN: 9 g/dL — AB (ref 6.0–8.3)
Total Bilirubin: 0.3 mg/dL (ref 0.3–1.2)

## 2014-03-20 LAB — CBC WITH DIFFERENTIAL/PLATELET
Basophils Absolute: 0 10*3/uL (ref 0.0–0.1)
Basophils Relative: 0 % (ref 0–1)
Eosinophils Absolute: 0.2 10*3/uL (ref 0.0–0.7)
Eosinophils Relative: 3 % (ref 0–5)
HCT: 42.4 % (ref 36.0–46.0)
Hemoglobin: 14.5 g/dL (ref 12.0–15.0)
LYMPHS ABS: 1 10*3/uL (ref 0.7–4.0)
LYMPHS PCT: 17 % (ref 12–46)
MCH: 29.6 pg (ref 26.0–34.0)
MCHC: 34.2 g/dL (ref 30.0–36.0)
MCV: 86.5 fL (ref 78.0–100.0)
MONOS PCT: 5 % (ref 3–12)
Monocytes Absolute: 0.3 10*3/uL (ref 0.1–1.0)
NEUTROS PCT: 75 % (ref 43–77)
Neutro Abs: 4.2 10*3/uL (ref 1.7–7.7)
PLATELETS: 331 10*3/uL (ref 150–400)
RBC: 4.9 MIL/uL (ref 3.87–5.11)
RDW: 14.6 % (ref 11.5–15.5)
WBC: 5.7 10*3/uL (ref 4.0–10.5)

## 2014-03-20 LAB — URINE MICROSCOPIC-ADD ON

## 2014-03-20 LAB — LIPASE, BLOOD: Lipase: 17 U/L (ref 11–59)

## 2014-03-20 LAB — POC URINE PREG, ED: Preg Test, Ur: NEGATIVE

## 2014-03-20 MED ORDER — ONDANSETRON HCL 4 MG/2ML IJ SOLN
4.0000 mg | Freq: Once | INTRAMUSCULAR | Status: AC
  Administered 2014-03-20: 4 mg via INTRAVENOUS
  Filled 2014-03-20: qty 2

## 2014-03-20 MED ORDER — MORPHINE SULFATE 4 MG/ML IJ SOLN
4.0000 mg | Freq: Once | INTRAMUSCULAR | Status: AC
  Administered 2014-03-20: 4 mg via INTRAVENOUS
  Filled 2014-03-20: qty 1

## 2014-03-20 MED ORDER — SODIUM CHLORIDE 0.9 % IV BOLUS (SEPSIS)
1000.0000 mL | Freq: Once | INTRAVENOUS | Status: AC
  Administered 2014-03-20: 1000 mL via INTRAVENOUS

## 2014-03-20 MED ORDER — PROMETHAZINE HCL 25 MG PO TABS: 25.0000 mg | ORAL_TABLET | Freq: Four times a day (QID) | ORAL | Status: DC | PRN

## 2014-03-20 NOTE — Discharge Instructions (Signed)
Call for a follow up appointment with a Family or Primary Care Provider.  Return if Symptoms worsen.   Take medication as prescribed.  Continue a clear liquid diet today and advance to the BRAT (Bananas, Rice, Applesauce and Toast) diet as tolerated.  Make sure you pump and dump your breast milk for tonight. Call your OB/GYN if you need to use the phenergan again for pump and dump instructions. Research shows one dose is ok for the infant, but I would still pump and dump for the rest of the day if you need the nausea medication.

## 2014-03-20 NOTE — ED Provider Notes (Signed)
CSN: [id number]     Arrival date & time 03/20/14  1703 History   First MD Initiated Contact with Patient 03/20/14 1733     Chief Complaint  Patient presents with  . Abdominal Pain     (Consider location/radiation/quality/duration/timing/severity/associated sxs/prior Treatment) HPI Comments: Cathy Nash is a 33 y.o. female with a past medical history of asthma, recent delivery 02/21/2014 presenting the Emergency Department with a chief complaint of epigastric discomfort, vomiting, and diarrhea.  The patient reports cramping epigastric discomfort, non-radiating.  She reports 7 episodes of non bloody emesis.  She reports multiple episodes of diarrhea.  Denies sick contact.   The history is provided by the patient and medical records. No language interpreter was used.    Past Medical History  Diagnosis Date  . Asthma   . Asthma   . Preterm labor     1st pregnancy d/t pre-e  . Pregnancy induced hypertension     previous pregnancy  . Pyelonephritis     June 2013  . Varicose veins    Past Surgical History  Procedure Laterality Date  . No past surgeries     Family History  Problem Relation Age of Onset  . Anesthesia problems Neg Hx   . Hypotension Neg Hx   . Malignant hyperthermia Neg Hx   . Pseudochol deficiency Neg Hx   . Other Neg Hx   . Diabetes Mother   . Asthma Father    History  Substance Use Topics  . Smoking status: Former Games developer  . Smokeless tobacco: Never Used     Comment: yrs ago  . Alcohol Use: No     Comment: a little when not pregnant   OB History   Grav Para Term Preterm Abortions TAB SAB Ect Mult Living   3 3 2 1  0 0 0 0 0 3     Review of Systems  Constitutional: Negative for fever and chills.  Gastrointestinal: Positive for nausea, vomiting and abdominal pain. Negative for diarrhea.  Genitourinary: Negative for dysuria.      Allergies  Iohexol  Home Medications   Current Outpatient Rx  Name  Route  Sig  Dispense  Refill  .  hydrocortisone (ANUSOL-HC) 25 MG suppository   Rectal   Place 25 mg rectally 2 (two) times daily as needed for hemorrhoids or itching.          BP 105/79  Pulse 116  Temp(Src) 97.5 F (36.4 C) (Oral)  Resp 20  SpO2 98% Physical Exam  Nursing note and vitals reviewed. Constitutional: She is oriented to person, place, and time. She appears well-developed and well-nourished. No distress.  Appears uncomfortable  HENT:  Head: Normocephalic and atraumatic.  Eyes: EOM are normal. Pupils are equal, round, and reactive to light. No scleral icterus.  Neck: Neck supple.  Cardiovascular: Regular rhythm and normal heart sounds.  Tachycardia present.   No murmur heard. Pulmonary/Chest: Effort normal and breath sounds normal. She has no wheezes. She has no rales. She exhibits no tenderness.  Abdominal: Soft. Bowel sounds are normal. There is tenderness in the epigastric area. There is no rigidity, no rebound, no guarding, no CVA tenderness, no tenderness at McBurney's point and negative Murphy's sign.  Mild epigastric tenderness.  Musculoskeletal: Normal range of motion. She exhibits no edema.  Neurological: She is alert and oriented to person, place, and time.  Skin: Skin is warm and dry. No rash noted.  Psychiatric: She has a normal mood and affect. Her behavior is normal.  ED Course  Procedures (including critical care time) Labs Review Labs Reviewed  BASIC METABOLIC PANEL - Abnormal; Notable for the following:    CO2 17 (*)    Glucose, Bld 110 (*)    All other components within normal limits  URINALYSIS, ROUTINE W REFLEX MICROSCOPIC - Abnormal; Notable for the following:    Color, Urine AMBER (*)    APPearance CLOUDY (*)    Hgb urine dipstick MODERATE (*)    Bilirubin Urine MODERATE (*)    Ketones, ur 15 (*)    Protein, ur 100 (*)    Leukocytes, UA SMALL (*)    All other components within normal limits  HEPATIC FUNCTION PANEL - Abnormal; Notable for the following:    Total  Protein 9.0 (*)    AST 39 (*)    Alkaline Phosphatase 197 (*)    All other components within normal limits  URINE MICROSCOPIC-ADD ON - Abnormal; Notable for the following:    Casts HYALINE CASTS (*)    All other components within normal limits  CBC WITH DIFFERENTIAL  LIPASE, BLOOD  POC URINE PREG, ED   Imaging Review No results found.   EKG Interpretation None      MDM   Final diagnoses:  Gastritis   Pt with epigastric discomfort and Vomiting, diarrhea, afebrile. Questionable Gastroenteritis. Labs, IVF, pain medication, nausea medication ordered. Re-eval pt resting comfortably in room.  Denies abdominal pain or further emesis requesting something to drink. Re-eval pt denies abdominal pain. Able to tolerate PO fluids. Requesting to be discharged. Abdomen soft non-tender. Discussed lab results, and treatment plan with the patient. Return precautions given. Reports understanding and no other concerns at this time.  Patient is stable for discharge at this time.   Meds given in ED:  Medications  sodium chloride 0.9 % bolus 1,000 mL (0 mLs Intravenous Stopped 03/20/14 1900)  morphine 4 MG/ML injection 4 mg (4 mg Intravenous Given 03/20/14 1808)  ondansetron (ZOFRAN) injection 4 mg (4 mg Intravenous Given 03/20/14 1808)    New Prescriptions   No medications on file        Clabe SealLauren M Karine Garn, PA-C 03/21/14 1908

## 2014-03-20 NOTE — ED Notes (Signed)
Pt and significant other expressed understanding of AVS. VSS. Patient has no c/o pain. PIV removed. Patient walked out with steady gait and no problems.

## 2014-03-20 NOTE — ED Notes (Signed)
Pt presents with c/o abdominal pain. Pt says the abdominal pain started this morning. Pt says the pain is mid abdominal area, experiencing diarrhea and vomiting. Pt is 3 weeks post-partum, still having some vaginal bleeding.

## 2014-03-20 NOTE — ED Notes (Signed)
Pt A&Ox4. C/o extreme abdominal pain with guarding. Explained to patient MD would be in shortly to assess and write orders. PIV inserted.

## 2014-03-22 NOTE — ED Provider Notes (Signed)
Medical screening examination/treatment/procedure(s) were performed by non-physician practitioner and as supervising physician I was immediately available for consultation/collaboration.   EKG Interpretation None        Zonia Caplin N Marietta Sikkema, DO 03/22/14 0701 

## 2014-04-07 ENCOUNTER — Ambulatory Visit: Payer: Self-pay | Admitting: Obstetrics & Gynecology

## 2014-04-07 ENCOUNTER — Telehealth: Payer: Self-pay | Admitting: *Deleted

## 2014-04-07 NOTE — Telephone Encounter (Signed)
Called patient and informed of missed appointment, pt forget appointment, will call and reschedule appointment in the am.

## 2014-06-26 ENCOUNTER — Emergency Department (HOSPITAL_COMMUNITY)
Admission: EM | Admit: 2014-06-26 | Discharge: 2014-06-26 | Disposition: A | Discharge status: Home / Self Care | Payer: Self-pay | Attending: Emergency Medicine | Admitting: Emergency Medicine

## 2014-06-26 ENCOUNTER — Emergency Department (HOSPITAL_COMMUNITY): Payer: Self-pay

## 2014-06-26 ENCOUNTER — Emergency Department (HOSPITAL_COMMUNITY): Payer: Medicaid Other

## 2014-06-26 ENCOUNTER — Encounter (HOSPITAL_COMMUNITY): Payer: Self-pay | Admitting: Emergency Medicine

## 2014-06-26 DIAGNOSIS — Z79899 Other long term (current) drug therapy: Secondary | ICD-10-CM | POA: Insufficient documentation

## 2014-06-26 DIAGNOSIS — Z8679 Personal history of other diseases of the circulatory system: Secondary | ICD-10-CM | POA: Insufficient documentation

## 2014-06-26 DIAGNOSIS — Z87891 Personal history of nicotine dependence: Secondary | ICD-10-CM | POA: Insufficient documentation

## 2014-06-26 DIAGNOSIS — K8070 Calculus of gallbladder and bile duct without cholecystitis without obstruction: Secondary | ICD-10-CM

## 2014-06-26 DIAGNOSIS — Z3202 Encounter for pregnancy test, result negative: Secondary | ICD-10-CM | POA: Insufficient documentation

## 2014-06-26 DIAGNOSIS — N12 Tubulo-interstitial nephritis, not specified as acute or chronic: Secondary | ICD-10-CM

## 2014-06-26 DIAGNOSIS — J45901 Unspecified asthma with (acute) exacerbation: Secondary | ICD-10-CM | POA: Insufficient documentation

## 2014-06-26 LAB — COMPREHENSIVE METABOLIC PANEL
ALT: 43 U/L — ABNORMAL HIGH (ref 0–35)
AST: 55 U/L — AB (ref 0–37)
Albumin: 3.4 g/dL — ABNORMAL LOW (ref 3.5–5.2)
Alkaline Phosphatase: 234 U/L — ABNORMAL HIGH (ref 39–117)
Anion gap: 9 (ref 5–15)
BUN: 9 mg/dL (ref 6–23)
CALCIUM: 9.1 mg/dL (ref 8.4–10.5)
CO2: 28 mEq/L (ref 19–32)
CREATININE: 0.5 mg/dL (ref 0.50–1.10)
Chloride: 101 mEq/L (ref 96–112)
GFR calc non Af Amer: >90 mL/min (ref >90)
Glucose, Bld: 92 mg/dL (ref 70–99)
Potassium: 4.1 mEq/L (ref 3.7–5.3)
SODIUM: 138 meq/L (ref 137–147)
Total Bilirubin: 0.3 mg/dL (ref 0.3–1.2)
Total Protein: 7.1 g/dL (ref 6.0–8.3)

## 2014-06-26 LAB — URINALYSIS, ROUTINE W REFLEX MICROSCOPIC
Bilirubin Urine: NEGATIVE
GLUCOSE, UA: NEGATIVE mg/dL
KETONES UR: NEGATIVE mg/dL
Nitrite: NEGATIVE
PROTEIN: NEGATIVE mg/dL
Specific Gravity, Urine: 1.01 (ref 1.005–1.030)
UROBILINOGEN UA: 0.2 mg/dL (ref 0.0–1.0)
pH: 6 (ref 5.0–8.0)

## 2014-06-26 LAB — CBC WITH DIFFERENTIAL/PLATELET
BASOS ABS: 0 10*3/uL (ref 0.0–0.1)
Basophils Relative: 0 % (ref 0–1)
EOS ABS: 0.2 10*3/uL (ref 0.0–0.7)
EOS PCT: 2 % (ref 0–5)
HEMATOCRIT: 36.3 % (ref 36.0–46.0)
Hemoglobin: 12 g/dL (ref 12.0–15.0)
Lymphocytes Relative: 29 % (ref 12–46)
Lymphs Abs: 2.1 10*3/uL (ref 0.7–4.0)
MCH: 29.4 pg (ref 26.0–34.0)
MCHC: 33.1 g/dL (ref 30.0–36.0)
MCV: 89 fL (ref 78.0–100.0)
Monocytes Absolute: 0.5 10*3/uL (ref 0.1–1.0)
Monocytes Relative: 7 % (ref 3–12)
Neutro Abs: 4.4 10*3/uL (ref 1.7–7.7)
Neutrophils Relative %: 62 % (ref 43–77)
PLATELETS: 270 10*3/uL (ref 150–400)
RBC: 4.08 MIL/uL (ref 3.87–5.11)
RDW: 13.5 % (ref 11.5–15.5)
WBC: 7.2 10*3/uL (ref 4.0–10.5)

## 2014-06-26 LAB — LIPASE, BLOOD: Lipase: 41 U/L (ref 11–59)

## 2014-06-26 LAB — URINE MICROSCOPIC-ADD ON

## 2014-06-26 LAB — WET PREP, GENITAL
Trich, Wet Prep: NONE SEEN
YEAST WET PREP: NONE SEEN

## 2014-06-26 LAB — RPR

## 2014-06-26 LAB — PREGNANCY, URINE: Preg Test, Ur: NEGATIVE

## 2014-06-26 LAB — HIV ANTIBODY (ROUTINE TESTING W REFLEX): HIV 1&2 Ab, 4th Generation: NONREACTIVE

## 2014-06-26 MED ORDER — MORPHINE SULFATE 4 MG/ML IJ SOLN
4.0000 mg | Freq: Once | INTRAMUSCULAR | Status: AC
  Administered 2014-06-26: 4 mg via INTRAVENOUS
  Filled 2014-06-26: qty 1

## 2014-06-26 MED ORDER — HYDROCODONE-ACETAMINOPHEN 5-325 MG PO TABS: 1.0000 | ORAL_TABLET | Freq: Four times a day (QID) | ORAL | Status: DC | PRN

## 2014-06-26 MED ORDER — DEXTROSE 5 % IV SOLN
1.0000 g | Freq: Once | INTRAVENOUS | Status: AC
  Administered 2014-06-26: 1 g via INTRAVENOUS
  Filled 2014-06-26: qty 10

## 2014-06-26 MED ORDER — CEPHALEXIN 500 MG PO CAPS: 500.0000 mg | ORAL_CAPSULE | Freq: Three times a day (TID) | ORAL | Status: DC

## 2014-06-26 MED ORDER — ONDANSETRON HCL 4 MG/2ML IJ SOLN
4.0000 mg | Freq: Once | INTRAMUSCULAR | Status: AC
  Administered 2014-06-26: 4 mg via INTRAVENOUS
  Filled 2014-06-26: qty 2

## 2014-06-26 NOTE — ED Notes (Signed)
Patient requesting additional pain meds, PA notified.

## 2014-06-26 NOTE — ED Notes (Signed)
Pt states hx of kidney infections.  C/o lt sided back/flank pain x 3 days w/ dysuria.

## 2014-06-26 NOTE — ED Provider Notes (Signed)
CSN: [id number]     Arrival date & time 06/26/14  1343 History   First MD Initiated Contact with Patient 06/26/14 1501     Chief Complaint  Patient presents with  . Back Pain  . Flank Pain     (Consider location/radiation/quality/duration/timing/severity/associated sxs/prior Treatment) The history is provided by the patient. A language interpreter was used ([place]).  Cathy Nash is a 33 year old female with past medical history of asthma, pyelonephritis, which is a birth to a child approximately 4-5 months ago presenting to the ED with back pain, vaginal discharge, feeling feverish for the past 3 days. Patient reported that the discomfort is localized to lower portion of her back described as a constant throbbing sensation without radiation. Stated that she started to have suprapubic discomfort described as a cramping sensation associated with vaginal discharge is a white discomfort that started approximately 3 days ago. Patient reported that with each urination she has discomfort described as a burning sensation. Stated that she's been feeling feverish-stated that she did not take her temperature secondary to not having equipment. Stated that she's been using extra strength Tylenol but has controlled the pain moderately. Stated that she had a history of kidney infection approximately 4-5 months ago while she was pregnant. Stated that she's noticed a vaginal "ball" has been ongoing for the past 4-5 months-stated that the lesion does hurt, reported that has not gotten any larger, reported that there's been no drainage. Stated this morning that she felt short of breath secondary to increasing pain. Denied nausea, vomiting, diarrhea, vaginal bleeding, neck pain, chest pain, weakness, numbness, tingling, leg swelling, cough, hemoptysis, travel, birth control, irregular vaginal bleeding, melena, hematochezia. PCP none   Past Medical History  Diagnosis Date  . Asthma   . Asthma   . Preterm  labor     1st pregnancy d/t pre-e  . Pregnancy induced hypertension     previous pregnancy  . Pyelonephritis     June 2013  . Varicose veins    Past Surgical History  Procedure Laterality Date  . No past surgeries     Family History  Problem Relation Age of Onset  . Anesthesia problems Neg Hx   . Hypotension Neg Hx   . Malignant hyperthermia Neg Hx   . Pseudochol deficiency Neg Hx   . Other Neg Hx   . Diabetes Mother   . Asthma Father    History  Substance Use Topics  . Smoking status: Former Research scientist (life sciences)  . Smokeless tobacco: Never Used     Comment: yrs ago  . Alcohol Use: No     Comment: a little when not pregnant   OB History   Grav Para Term Preterm Abortions TAB SAB Ect Mult Living   3 3 2 1  0 0 0 0 0 3     Review of Systems  Constitutional: Positive for fever (subjective) and chills. Negative for fatigue.  Respiratory: Positive for shortness of breath. Negative for chest tightness.   Cardiovascular: Negative for chest pain.  Gastrointestinal: Positive for abdominal pain. Negative for nausea, vomiting, diarrhea, constipation, blood in stool and anal bleeding.  Genitourinary: Positive for dysuria, flank pain, vaginal discharge and pelvic pain. Negative for hematuria, decreased urine volume and vaginal bleeding.  Musculoskeletal: Positive for back pain.  Neurological: Negative for dizziness and weakness.      Allergies  Iohexol  Home Medications   Prior to Admission medications   Medication Sig Start Date End Date Taking? Authorizing Provider  acetaminophen (TYLENOL) 500  MG tablet Take 1,000 mg by mouth every 4 (four) hours as needed for moderate pain.   Yes Historical Provider, MD  Multiple Vitamin (MULTIVITAMIN WITH MINERALS) TABS tablet Take 1 tablet by mouth daily.   Yes Historical Provider, MD  cephALEXin (KEFLEX) 500 MG capsule Take 1 capsule (500 mg total) by mouth 3 (three) times daily. 06/26/14   Sheniya Garciaperez, PA-C  HYDROcodone-acetaminophen  (NORCO/VICODIN) 5-325 MG per tablet Take 1 tablet by mouth every 6 (six) hours as needed for moderate pain or severe pain. 06/26/14   Tywanda Rice, PA-C   BP 114/61  Pulse 80  Temp(Src) 98.1 F (36.7 C) (Oral)  Resp 16  SpO2 100% Physical Exam  Nursing note and vitals reviewed. Constitutional: She is oriented to person, place, and time. She appears well-developed and well-nourished. No distress.  HENT:  Head: Normocephalic and atraumatic.  Mouth/Throat: Oropharynx is clear and moist. No oropharyngeal exudate.  Eyes: Conjunctivae and EOM are normal. Pupils are equal, round, and reactive to light. Right eye exhibits no discharge. Left eye exhibits no discharge.  Neck: Normal range of motion. Neck supple. No tracheal deviation present.  Negative neck stiffness Negative nuchal rigidity Negative cervical lymphadenopathy Negative meningeal signs   Cardiovascular: Normal rate, regular rhythm and normal heart sounds.   Pulses:      Radial pulses are 2+ on the right side, and 2+ on the left side.       Dorsalis pedis pulses are 2+ on the right side, and 2+ on the left side.  Cap refill less than 3 seconds Negative swelling or pitting edema identified to lower extremities bilaterally  Pulmonary/Chest: Breath sounds normal. No respiratory distress. She has no wheezes. She has no rales.  Abdominal: Soft. Bowel sounds are normal. She exhibits no distension. There is tenderness. There is no rebound and no guarding.  Negative abdominal distention Bowel sounds normoactive in all 4 quadrants Abdomen soft upon palpation Negative guarding or rigidity Negative peritoneal signs  Discomfort upon palpation to suprapubic region Bilateral CVA tenderness noted  Genitourinary: Vagina normal.  Pelvic exam: Negative swelling, erythema, inflammation, lesions, sores, deformities, masses identified to the external genitalia. Negative swelling, erythema, inflammation, lesions, sores, deformities, masses noted  to the vaginal canal. Negative bright red blood in the vaginal vault. Negative discharge noted. Cervix identified negative friability, lesions, abnormalities noted. CMT noted with left adnexal tenderness. Negative right adnexal tenderness. Negative abscess noted on pelvic exam. Exam chaperoned with tech.   Musculoskeletal: Normal range of motion. She exhibits tenderness.       Lumbar back: She exhibits tenderness. She exhibits normal range of motion, no bony tenderness, no swelling, no edema, no deformity and no laceration.       Back:  Discomfort upon palpation to the lumbosacral spine as well paravertebral regions bilaterally with negative findings of deformities or abnormalities.  Full ROM to upper and lower extremities without difficulty noted, negative ataxia noted.  Lymphadenopathy:    She has no cervical adenopathy.  Neurological: She is alert and oriented to person, place, and time. No cranial nerve deficit. She exhibits normal muscle tone. Coordination normal.  Skin: Skin is warm and dry. No rash noted. She is not diaphoretic. No erythema.  Psychiatric: She has a normal mood and affect. Her behavior is normal. Thought content normal.    ED Course  Procedures (including critical care time) Labs Review Labs Reviewed  WET PREP, GENITAL - Abnormal; Notable for the following:    Clue Cells Wet Prep HPF POC  FEW (*)    WBC, Wet Prep HPF POC FEW (*)    All other components within normal limits  URINALYSIS, ROUTINE W REFLEX MICROSCOPIC - Abnormal; Notable for the following:    APPearance CLOUDY (*)    Hgb urine dipstick SMALL (*)    Leukocytes, UA LARGE (*)    All other components within normal limits  URINE MICROSCOPIC-ADD ON - Abnormal; Notable for the following:    Bacteria, UA FEW (*)    All other components within normal limits  COMPREHENSIVE METABOLIC PANEL - Abnormal; Notable for the following:    Albumin 3.4 (*)    AST 55 (*)    ALT 43 (*)    Alkaline Phosphatase 234 (*)      All other components within normal limits  GC/CHLAMYDIA PROBE AMP  URINE CULTURE  PREGNANCY, URINE  CBC WITH DIFFERENTIAL  LIPASE, BLOOD  HIV ANTIBODY (ROUTINE TESTING)  RPR    Imaging Review Ct Abdomen Pelvis Wo Contrast  06/26/2014   CLINICAL DATA:  Low dose stone protocol, left flank pain  EXAM: CT ABDOMEN AND PELVIS WITHOUT CONTRAST  TECHNIQUE: Multidetector CT imaging of the abdomen and pelvis was performed following the standard protocol without IV contrast.  COMPARISON:  None.  FINDINGS: Lung bases are clear.  No pericardial fluid.  No focal hepatic lesions on this non contrast exam. Small gallstone within the lumen of the gallbladder. No pericholecystic fluid. The pancreas, spleen, adrenal glands normal.  There is no nephrolithiasis or ureterolithiasis. No obstructive uropathy.  The stomach, small bowel, appendix, and cecum are normal. The colon and rectosigmoid colon are normal.  Abdominal is normal caliber. No retroperitoneal periportal lymphadenopathy.  No free fluid the pelvis. The uterus and ovaries are normal. There are no distal ureteral stones or bladder stones. No pelvic lymphadenopathy. No aggressive osseous lesion.  IMPRESSION: 1. No nephrolithiasis, ureterolithiasis, or obstructive uropathy. 2. Normal appendix. 3. Tiny gallstone without evidence of cholecystitis.   Electronically Signed   By: Suzy Bouchard M.D.   On: 06/26/2014 18:17   US Transvaginal Non-ob  06/26/2014   CLINICAL DATA:  Pelvic pain  EXAM: TRANSABDOMINAL AND TRANSVAGINAL ULTRASOUND OF PELVIS  DOPPLER ULTRASOUND OF OVARIES  TECHNIQUE: Study was performed transabdominally to optimize pelvic field of view evaluation and transvaginally to optimize internal visceral architecture evaluation. Doppler ultrasound of the ovaries was also performed.  COMPARISON:  CT abdomen and pelvis June 26, 2014  FINDINGS: Uterus  Measurements: 7.8 x 5.1 x 4.1 cm. No fibroids or other mass visualized. Uterus is anteverted.   Endometrium  Thickness: 12 mm. No focal abnormality visualized. Endometrial contour is smooth.  Right ovary  Measurements: 2.6 x 2.2 x 2.0 cm. Normal appearance/no adnexal mass.  Left ovary  Measurements: 2.7 x 3.6 x 2.9 cm. Normal appearance/no adnexal mass. There is a dominant follicle measuring 1.8 x 1.9 x 2.2 cm.  Pulsed Doppler evaluation of both ovaries demonstrates normal low-resistance arterial and venous waveforms. The peak systolic velocity in the right ovary is 6 cm/sec. The peak systolic velocity in the left ovary is 10 cm/sec.  Other findings  Trace free fluid.  IMPRESSION: Trace free pelvic fluid is felt to be physiologic. Study otherwise unremarkable. No evidence of ovarian torsion. No pelvic masses are identified beyond a dominant physiologic follicle in the left ovary.   Electronically Signed   By: Lowella Grip M.D.   On: 06/26/2014 21:14   US Pelvis Complete  06/26/2014   CLINICAL DATA:  Pelvic pain  EXAM: TRANSABDOMINAL AND TRANSVAGINAL ULTRASOUND OF PELVIS  DOPPLER ULTRASOUND OF OVARIES  TECHNIQUE: Study was performed transabdominally to optimize pelvic field of view evaluation and transvaginally to optimize internal visceral architecture evaluation. Doppler ultrasound of the ovaries was also performed.  COMPARISON:  CT abdomen and pelvis June 26, 2014  FINDINGS: Uterus  Measurements: 7.8 x 5.1 x 4.1 cm. No fibroids or other mass visualized. Uterus is anteverted.  Endometrium  Thickness: 12 mm. No focal abnormality visualized. Endometrial contour is smooth.  Right ovary  Measurements: 2.6 x 2.2 x 2.0 cm. Normal appearance/no adnexal mass.  Left ovary  Measurements: 2.7 x 3.6 x 2.9 cm. Normal appearance/no adnexal mass. There is a dominant follicle measuring 1.8 x 1.9 x 2.2 cm.  Pulsed Doppler evaluation of both ovaries demonstrates normal low-resistance arterial and venous waveforms. The peak systolic velocity in the right ovary is 6 cm/sec. The peak systolic velocity in the left ovary is  10 cm/sec.  Other findings  Trace free fluid.  IMPRESSION: Trace free pelvic fluid is felt to be physiologic. Study otherwise unremarkable. No evidence of ovarian torsion. No pelvic masses are identified beyond a dominant physiologic follicle in the left ovary.   Electronically Signed   By: Lowella Grip M.D.   On: 06/26/2014 21:14   [place] Art/ven Flow Abd Pelv Doppler  06/26/2014   CLINICAL DATA:  Pelvic pain  EXAM: TRANSABDOMINAL AND TRANSVAGINAL ULTRASOUND OF PELVIS  DOPPLER ULTRASOUND OF OVARIES  TECHNIQUE: Study was performed transabdominally to optimize pelvic field of view evaluation and transvaginally to optimize internal visceral architecture evaluation. Doppler ultrasound of the ovaries was also performed.  COMPARISON:  CT abdomen and pelvis June 26, 2014  FINDINGS: Uterus  Measurements: 7.8 x 5.1 x 4.1 cm. No fibroids or other mass visualized. Uterus is anteverted.  Endometrium  Thickness: 12 mm. No focal abnormality visualized. Endometrial contour is smooth.  Right ovary  Measurements: 2.6 x 2.2 x 2.0 cm. Normal appearance/no adnexal mass.  Left ovary  Measurements: 2.7 x 3.6 x 2.9 cm. Normal appearance/no adnexal mass. There is a dominant follicle measuring 1.8 x 1.9 x 2.2 cm.  Pulsed Doppler evaluation of both ovaries demonstrates normal low-resistance arterial and venous waveforms. The peak systolic velocity in the right ovary is 6 cm/sec. The peak systolic velocity in the left ovary is 10 cm/sec.  Other findings  Trace free fluid.  IMPRESSION: Trace free pelvic fluid is felt to be physiologic. Study otherwise unremarkable. No evidence of ovarian torsion. No pelvic masses are identified beyond a dominant physiologic follicle in the left ovary.   Electronically Signed   By: Lowella Grip M.D.   On: 06/26/2014 21:14   US Abdomen Limited Ruq  06/26/2014   CLINICAL DATA:  Elevated alk-phos, abdominal pain, back pain.  EXAM: US ABDOMEN LIMITED - RIGHT UPPER QUADRANT  COMPARISON:  None.   FINDINGS: Gallbladder:  Multiple tiny shadowing echogenic foci seen within the gallbladder lumen, compatible with small stones. These were seen to shift a with changes in patient positioning. No gallbladder wall thickening or free prior cholecystic fluid. No sonographic Murphy sign elicited on exam.  Common bile duct:  Diameter: 5.3 mm.  Liver:  No focal lesion identified. Within normal limits in parenchymal echogenicity.  IMPRESSION: Cholelithiasis without sonographic evidence of acute cholecystitis or biliary dilatation.   Electronically Signed   By: Jeannine Boga M.D.   On: 06/26/2014 21:09     EKG Interpretation None      MDM   Final  diagnoses:  Pyelonephritis  Calculus of gallbladder and bile duct without cholecystitis or obstruction    Medications  morphine 4 MG/ML injection 4 mg (4 mg Intravenous Given 06/26/14 1710)  ondansetron (ZOFRAN) injection 4 mg (4 mg Intravenous Given 06/26/14 1709)  cefTRIAXone (ROCEPHIN) 1 g in dextrose 5 % 50 mL IVPB (0 g Intravenous Stopped 06/26/14 1801)    Filed Vitals:   06/26/14 1351 06/26/14 1648 06/26/14 2100  BP: 119/69 115/58 114/61  Pulse: 79 72 80  Temp: 98.3 F (36.8 C) 98.1 F (36.7 C)   TempSrc: Oral Oral   Resp: 20 16 16   SpO2: 100% 100% 100%   CBC negative elevated white blood cell count-negative left shift or leukocytosis noted. Hemoglobin 12.0, hematocrit 36.3. CMP noted mildly elevated AST of 55, ALT 43, alkaline phosphatase of 234. BUN 9, creatinine 0.50. Lipase negative elevation. Urine pregnancy negative. Urinalysis noted small hemoglobin with a large leukocytes with white blood cells nitrite 21-50. Urine culture pending. Wet prep noted few clue cells with a few white blood cells. HIV nonreactive. RPR nonreactive. CT abdomen and pelvis without contrast negative for nephrolithiasis, urolithiasis or structures uropathy-normal appendix identified. Tiny gallstone without evidence of cholecystitis noted on imaging. The stomach,  small bowel, appendix and cecum appear normal-the colon and rectosigmoid colon appear normal. Negative acute abdominal processes identified. Pelvic US noted trace of fluid in the pelvis-physiological finding. Unremarkable ultrasound-no evidence of ovarian torsion, no pelvic masses identified. Right upper quadrant abdominal ultrasound due to cholelithiasis without significant sonographic evidence of acute cholecystitis or biliary dilation.  Doubt pancreatitis. Doubt appendicitis. Doubt acute cholecystitis/cholangitis. Doubt acute abdominal processes. Doubt TOA. Doubt ovarian torsion. Doubt UTI. Doubt nephrolithiasis. Patient presenting to the ED with pyelonephritis. Patient given IV fluids, pain medications and IV antibiotics while in ED setting. Patient able to eat and drink without difficulty-negative episodes of emesis while in ED setting. Patient stable, afebrile. Patient not septic appearing. Discharged patient. Discharged patient with pain medication and antibiotics. Patient reported that she does not breast feed. [facility]. Discussed with patient to avoid any physical strenuous activity. Discussed with patient to closely monitor symptoms and if symptoms are to worsen or change to report back to the ED - strict return instructions given.  Patient agreed to plan of care, understood, all questions answered.   Jamse Mead, PA-C 06/27/14 1456  Cathy Spada, PA-C 06/27/14 1458

## 2014-06-26 NOTE — ED Notes (Signed)
US at bedside. Will obtain vitals when US is complete.

## 2014-06-26 NOTE — Discharge Instructions (Signed)
Please call an set-up an appointment with Surgery regarding gallbladder stones  Please call and set-up an appointment with Urology regarding kidney infection  Please call and set-up an appointment with [facility]  Please call and set-up an appointment with [facility] regarding kidney infection  Please take antibiotics as prescribed Please take pain medications as prescribed - while on pain medications there is to be no drinking alcohol, driving, operating any heavy machinery if there is extra please dispose in a proper manner. Please do not take any extra Tylenol for this can lead to Tylenol overdose and liver issues.  While on pain medications as antibiotics please do not breast feed.  Please continue to monitor symptoms closely and if symptoms are to worsen or change (fever greater than 101, chills, sweating, nausea, vomiting, chest pain, shortness of breath, difficulty breathing, weakness, numbness, tingling, worsening or changes to back pain, blood in the urine) please report back to the ED immediately   Por favor, llame a un set-up una cita con la ciruga con respecto piedras de la vescula biliar Por favor llame y puesta en marcha de una cita con el hospital de la Mujer Por favor llame y puesta en marcha una cita con la [facility] con respecto a la infeccin del rin Por favor, tome antibiticos prescritos Por favor, tome medicamentos para Lawyer se prescribe -, mientras que en medicamentos para el dolor va a haber nada de alcohol para beber, Science writer, operar maquinaria pesada si no es extra equipo disponga de [place]. Por favor, no tome ninguna Tylenol extra por esto puede conducir a problemas de sobredosis de Tylenol y el hgado. Mientras que en medicamentos para el dolor como antibiticos por favor no lo amamante. Por favor, seguir vigilando de cerca los sntomas y si los sntomas son a Theme park manager o cambio (fiebre de ms de 101,  escalofros, sudoracin, nuseas, vmitos, dolor de [place], falta de aire, dificultad para respirar, debilidad, entumecimiento, hormigueo, empeoramiento o cambios en el dolor de espalda , sangre en la orina), por favor informe a los servicios de urgencias de inmediato   Colelitiasis (Cholelithiasis) La colelitiasis (tambin llamada clculos en la vescula) es una enfermedad en la que se forman piedras en la vescula. La vescula es un rgano que almacena la bilis que se forma en el hgado y que ayuda a Engineer, agricultural. Los clculos comienzan como pequeos cristales y lentamente se transforman en piedras. El dolor en la vescula ocurre cuando se producen espasmos y los clculos obstruyen el conducto. El dolor tambin se produce cuando una piedra sale por el conducto.  FACTORES DE RIESGO  Ser mujer.   Tener embarazos mltiples. Algunas veces los mdicos aconsejan extirpar los clculos biliares antes de futuros embarazos.   Ser obeso.  Dietas que incluyan comidas fritas y grasas.   Ser mayor de 42 aos y el aumento de la edad.   El uso prolongado de medicamentos que contengan hormonas femeninas.   Tener diabetes mellitus.   Prdida rpida de peso.   Historia familiar de clculos (herencia).  SNTOMAS  Nuseas.   Vmitos.  Dolor abdominal.   Piel amarilla (ictericia)   Dolor sbito. Puede persistir desde algunos minutos hasta algunas horas.  Grant Ruts.   Sensibilidad al tacto. En algunos casos, cuando los clculos biliares no se mueven hacia el conducto biliar, las personas no sienten dolor ni presentan sntomas. Estos se denominan clculos "silenciosos".  TRATAMIENTO Los clculos silenciosos no requieren TEFL teacher. En los  casos graves, podr ser necesaria una Panamaciruga de urgencia. Las opciones de tratamiento son:  Kandis BanCiruga para extirpar la vescula. Es el tratamiento ms frecuente.  Medicamentos. No siempre dan resultado y pueden demorar entre 6 y 12 meses o ms en  Scientist, water qualityhacer efecto.  Tratamiento con ondas de choque (litotricia biliar extracorporal). En este tratamiento, una mquina de ultrasonido enva ondas de choque a la vescula para destruir los clculos en pequeos fragmentos que luego podrn pasar a los intestinos o ser disueltas con medicamentos. INSTRUCCIONES PARA EL CUIDADO EN EL HOGAR   Slo tome medicamentos de venta libre o recetados para Primary school teachercalmar el dolor, Environmental health practitionerel malestar o bajar la fiebre, segn las indicaciones de su mdico.   Siga una dieta baja en grasas hasta que su mdico lo vea nuevamente. Las grasas hacen que la vescula se Technical sales engineercontraiga, lo que puede Engineer, agriculturalproducir dolor.   Concurra a las consultas de control con su mdico segn las indicaciones. Los ataques casi siempre son recurrentes y generalmente habr que someterse a una ciruga como Bethaniatratamiento permanente.  SOLICITE ATENCIN MDICA DE INMEDIATO SI:   El dolor aumenta y no puede controlarlo con los medicamentos.   Tiene fiebre o sntomas persistentes durante ms de 2 - 3 das.   Tiene fiebre y los sntomas empeoran repentinamente.   Tiene nuseas o vmitos persistentes.  ASEGRESE DE QUE:   Comprende estas instrucciones.  Controlar su afeccin.  Recibir ayuda de inmediato si no mejora o si empeora. Document Released: 09/14/2006 Document Revised: 07/31/2013 [facility] Patient Information 2015 [place], [place]. This information is not intended to replace advice given to you by your health care provider. Make sure you discuss any questions you have with your health care provider.  Pielonefritis - Adultos  (Pyelonephritis, Adult)  La pielonefritis es una infeccin del rin. Hay dos tipos principales de pielonefritis:   Una infeccin que se inicia rpidamente sin sntomas previos (pielonefritis [place]aguda).  Infecciones que persisten por un largo perodo (pielonefritis crnica). CAUSAS  Hay dos causas principales:   Pasaje de bacterias desde la vejiga al rin. Este problema aparece  especialmente en mujeres embarazadas. La orina en la vejiga puede infectarse por diferentes causas, por ejemplo:  Inflamacin de la prstata (prostatitis).  Durante las United States Steel Corporationrelaciones sexuales en las mujeres.  Infeccin en la vejiga (cistitis).  Pasaje de bacterias desde la sangre hacia el rin. Las enfermedades que aumentan el riesgo son:   Diabetes.  Clculos renales o en la vescula.  Cncer.  Un catter colocado en la vejiga.  Otras anormalidades del rin o de Engineer, miningla uretra. SNTOMAS   Dolor abdominal  Dolor en la zona del costado o flanco.  Grant RutsFiebre.  Escalofros.  Malestar estomacal.  Sangre en la orina Larose Kells(orina oscura).  Necesidad frecuente de orinar  Necesidad intensa o persistente de Geographical information systems officerorinar.  Sensacin de ardor o pinchazos al ConocoPhillipsorinar. DIAGNSTICO  El mdico diagnosticar una infeccin en su rin basndose en los sntomas. Tambin tomar Colombiauna muestra de Comorosorina.  TRATAMIENTO  Generalmente el tratamiento depende de la gravedad de la infeccin.   Si la infeccin es leve y se diagnostica a tiempo, el mdico lo tratar con antibiticos por va oral y lo dejar irse a su casa.  Si la infeccin es ms grave, la bacteria podra haber ingresado al torrente sanguneo. Esto requerir antibiticos por va intravenosa y Administrator, artsla permanencia en el hospital. Los sntomas pueden incluir:  Fiebre alta.  Dolor intenso en un costado del cuerpo.  Escalofros  An despus de haber permanecido en el hospital,  el mdico podr indicarle antibiticos por va oral durante cierto perodo de [place].  Podr prescribirle otros tratamientos segn la causa de la infeccin. INSTRUCCIONES PARA EL CUIDADO EN EL HOGAR   Tome los antibiticos como se le indic. Tmelos todos, aunque se sienta mejor.  Concurra para Education officer, environmental un control y asegurarse de que la infeccin ha desaparecido.  Debe ingerir gran cantidad de lquido para mantener la orina de tono claro o color amarillo plido.  Tome  medicamentos para la vejiga si siente urgencia para Geographical information systems officer o lo hace con mucha frecuencia. SOLICITE ATENCIN MDICA DE INMEDIATO SI:   Tiene fiebre o sntomas persistentes durante ms de 2  3 das.  Tiene fiebre y los sntomas [address]  No puede tomar los antibiticos ni ingerir lquidos.  Comienza a sentir escalofros.  Siente debilidad extrema o se desmaya.  No mejora despus de 2 das de [place]. ASEGRESE DE QUE:   Comprende estas instrucciones.  Controlar su enfermedad.  Solicitar ayuda de inmediato si no mejora o empeora. Document Released: 09/07/2005 Document Revised: 05/29/2012 [facility] Patient Information 2015 [place], [place]. This information is not intended to replace advice given to you by your health care provider. Make sure you discuss any questions you have with your health care provider.     Emergency Department Resource Guide 1) Find a Doctor and Pay Out of Pocket Although you won't have to find out who is covered by your insurance plan, it is a good idea to ask around and get recommendations. You will then need to call the office and see if the doctor you have chosen will accept you as a new patient and what types of options they offer for patients who are self-pay. Some doctors offer discounts or will set up payment plans for their patients who do not have insurance, but you will need to ask so you aren't surprised when you get to your appointment.  2) Contact Your Local Health Department Not all health departments have doctors that can see patients for sick visits, but many do, so it is worth a call to see if yours does. If you don't know where your local health department is, you can check in your phone book. The CDC also has a tool to help you locate your state's health department, and many state websites also have listings of all of their local health departments.  3) Find a Walk-in Clinic If your illness is not likely to be very severe or complicated,  you may want to try a walk in clinic. These are popping up all over the country in pharmacies, drugstores, and shopping centers. They're usually staffed by nurse practitioners or physician assistants that have been trained to treat common illnesses and complaints. They're usually fairly quick and inexpensive. However, if you have serious medical issues or chronic medical problems, these are probably not your best option.  No Primary Care Doctor: - Call Health Connect at  [phone] - they can help you locate a primary care doctor that  accepts your insurance, provides certain services, etc. - Physician Referral Service- [phone]  Chronic Pain Problems: Organization         Address  Phone   Notes  [facility]  [phone] Patients need to be referred by their primary care doctor.   Medication Assistance: Organization         Address  Phone   Notes  [facility] Medication Assistance Program [address] [place] [place], [place] [address] [phone]  [phone] --Must be a resident of [facility] -- Must have NO insurance coverage whatsoever (no Medicaid/ Medicare, etc.) -- The pt. MUST have a primary care doctor that directs their care regularly and follows them in the community   MedAssist  [phone]   [employer]  [phone]    Agencies that provide inexpensive medical care: Organization         Address  Phone   Notes  [facility]  [phone]   [facility]    [phone]   [facility] [address] [place], [place] [address] [phone]   [facility] [address] [place], [place] [phone]   Planned Parenthood    [phone]   [facility]    [phone]   [facility]  [address], [place] Phone:  [phone], Fax:  [phone] Hours of Operation:  9 am - 6 pm, M-F.  Also accepts Medicaid/Medicare and  self-pay.  [facility]  [address], [place] Phone: [phone], Fax: [phone]. Hours of Operation:  8:30 am - 5:30 pm, M-F.  Also accepts Medicaid and self-pay.  [facility] [place] [address], [place] Phone: [phone]   [facility] [address] Natasha Bence [place], [place] [phone], Ext. [phone] Mondays & Thursdays: 7-9 AM.  First 15 patients are seen on a first come, first serve basis.    Medicaid-accepting [facility] Providers:  Organization         Address  Phone   Notes  [facility] [address],  [phone] Also accepts self-pay patients.  [facility] [address] Laurell Josephs [place], [place]  [phone]   [facility] [address], [place] [phone]   [facility] [address] [place] [phone]   Renaye Rakers [address], [place]   [phone] Only accepts [place] Access [place] patients after they have their name applied to their card.   Self-Pay (no insurance) in [facility]:  Organization         Address  Phone   Notes  Sickle Cell Patients, [facility] Internal Medicine [address] [place], [place] [phone]   [facility] [address] [place], [place] [phone]   [facility] [place]  [address], [place] [phone]   [facility]/Dr. Osei-Bonsu  [address], [place] or [address], [place] [phone] Phone number for both [place] and [place] locations is the same.  [facility] [address] [place] [phone]   [facility] [address] [place] or [address] [phone]   [facility] [address], [place] [phone], phone;  [phone], fax Sees patients 1st and 3rd Saturday of every month.  Must not qualify for public or private insurance (i.e. Medicaid, Medicare, [place] Health Choice, Veterans' Benefits)  Household income should be no more than 200% of the poverty level The clinic cannot treat you if you are pregnant or think you are pregnant  Sexually transmitted diseases are not treated at the clinic.    Dental Care: Organization  Address  Phone  Notes  [facility] [place], [place] [phone] Accepts children up to age 67 who are enrolled in [place] or [place]; pregnant women with a Medicaid card; and children who have applied for Medicaid or [place] Health Choice, but were declined, whose parents can pay a reduced fee at time of service.  [facility] Department of [facility]  [address], [place] [phone] Accepts children up to age 12 who are enrolled in [place] or [place]; pregnant women with a Medicaid card; and children who have applied for Medicaid or [place] Health Choice, but were declined, whose parents can pay a reduced fee at time of service.  [place] Adult [facility] Access PROGRAM  [place] [phone] Patients are seen by appointment only. Walk-ins are not accepted. [place] will see patients 81 years of age and older. Monday - Tuesday (8am-5pm) Most Wednesdays (8:30-5pm) $30 per visit, cash only  [facility] Adult Dental Access PROGRAM  [address], [facility] [phone] Patients are seen by appointment only. Walk-ins are not accepted. [place] will see patients 27 years of age and older. One Wednesday Evening (Monthly: Volunteer Based).  $30 per visit, cash only  [place]  [phone] for adults; Children under age 34, call Graduate Pediatric Dentistry at [phone]. Children aged 81-14, please call  [phone] to request a pediatric application.  Dental services are provided in all areas of dental care including fillings, crowns and bridges, complete and partial dentures, implants, gum treatment, root canals, and extractions. Preventive care is also provided. Treatment is provided to both adults and children. Patients are selected via a lottery and there is often a waiting list.   [facility] [address] [place]  [phone] [email or web address]   [facility] [address] [place], [place] [phone], Ext. [phone] Second and Fourth Thursday of each month, opens at 6:30 AM; Clinic ends at 9 AM.  Patients are seen on a first-come first-served basis, and a limited number are seen during each clinic.   [facility]  [address] Hillard Danker [place], [place] [phone]   Eligibility Requirements You must have lived in [place], [place], or [place] counties for at least the last three months.   You cannot be eligible for state or federal sponsored [employer], including [employer], [place], or [employer].   You generally cannot be eligible for healthcare insurance through your employer.    How to apply: Eligibility screenings are held every Tuesday and Wednesday afternoon from 1:00 pm until 4:00 pm. You do not need an appointment for the interview!  [facility] [address], [place], [place]   [place]  [place] Department  [place]  [phone]    Behavioral Health Resources in the Community: Intensive Outpatient Programs Organization         Address  Phone  Notes  [facility] [place]. [address] [place], [place] [phone]   [facility] Outpatient [address] [place], [place]   ADS: Alcohol & Drug Svcs [address], [place], [place]   [place] [address],  [place], [place] or [phone]   Substance Abuse Resources  Organization         Address  Phone  Notes  Alcohol and Drug Services  [place]  [phone]   The [place]  [phone]   Chinita Pester  [phone]   Residential & Outpatient Substance Abuse Program  [phone]   Psychological Services Organization         Address  Phone  Notes  [facility] [place]  [place]  [phone]   [place] [address], [place] or [phone]    Mobile Crisis Teams Organization         Address  Phone  Notes  [facility], Mobile Crisis Care Unit  [phone]   [facility]  [address] [place], [place]   [place] Levels [address], [place] [phone]    Self-Help/Support Groups Organization         Address  Phone             Notes  [place]. of [place] - variety of support groups  [place] Call for more information  Narcotics Anonymous (NA), [facility] [address], [employer] [place]  2 meetings at this location   Special educational needs teacher         Address  Phone  Notes  ASAP Residential Treatment [place],    [place]  [phone]   [facility]  [address] [place] [phone], [place], [place]   [place], [place] [phone] Admissions: 8am-3pm M-F  Incentives Substance Christian [address],    [place], [place] [phone]   [facility] [address] [place], [place], [place]   [facility] [address]  [place], [place]   [facility]ight Programs - Intensive Outpatient [address] Kristeen [address], [place], [place]   [facility] ([place].,  [place], [place] [phone] or [phone]   [facility] ([facility]) [address], [place], [place] Accepts Medicaid  [facility] [place] [address],  [place] [phone] Substance Abuse/Addiction Treatment   [facility] Organization         Address  Phone  Notes  [facility]  [phone]   Domenic Schwab, PhD [address] Arlis Porta [place], [place]   [phone] or [phone]   [place], [place] [phone]   [facility] [address], [place], [place] [phone] Insurance/Medicaid/sponsorship through [facility] and Families [address], [address] [place]                                    [place], [place] [phone] [place] [address], [place] [phone]    Dr. Adele Schilder  [phone]   [facility] Dept. 1) [address],  2) [place] 3)  [address], [place] [phone]  [phone]   [place] [phone] or [phone] (After Hours)

## 2014-06-28 LAB — GC/CHLAMYDIA PROBE AMP
CT Probe RNA: NEGATIVE
GC Probe RNA: NEGATIVE

## 2014-06-29 LAB — URINE CULTURE
Colony Count: >=100000
Special Requests: NORMAL

## 2014-06-30 ENCOUNTER — Telehealth (HOSPITAL_COMMUNITY): Payer: Self-pay

## 2014-06-30 NOTE — ED Provider Notes (Signed)
Medical screening examination/treatment/procedure(s) were performed by non-physician practitioner and as supervising physician I was immediately available for consultation/collaboration.   EKG Interpretation None        William Kalena Mander, MD 06/30/14 2314 

## 2014-06-30 NOTE — ED Notes (Signed)
Post ED Visit - Positive Culture Follow-up  Culture report reviewed by antimicrobial stewardship pharmacist: []  Wes Dulaney, Pharm.D., BCPS [x]  Celedonio MiyamotoJeremy Frens, Pharm.D., BCPS []  Georgina PillionElizabeth Martin, Pharm.D., BCPS []  HardingMinh Pham, VermontPharm.D., BCPS, AAHIVP []  Estella HuskMichelle Turner, Pharm.D., BCPS, AAHIVP []    Positive urine culture Treated with cephalexin, organism sensitive to the same and no further patient follow-up is required at this time.  Ashley JacobsFesterman, Dawanda Mapel C 06/30/2014, 10:03 AM

## 2014-08-03 ENCOUNTER — Encounter (HOSPITAL_COMMUNITY): Payer: Self-pay | Admitting: Emergency Medicine

## 2014-08-03 ENCOUNTER — Emergency Department (HOSPITAL_COMMUNITY)
Admission: EM | Admit: 2014-08-03 | Discharge: 2014-08-03 | Disposition: A | Discharge status: Home / Self Care | Payer: Medicaid Other | Attending: Emergency Medicine | Admitting: Emergency Medicine

## 2014-08-03 DIAGNOSIS — J45909 Unspecified asthma, uncomplicated: Secondary | ICD-10-CM | POA: Insufficient documentation

## 2014-08-03 DIAGNOSIS — K0889 Other specified disorders of teeth and supporting structures: Secondary | ICD-10-CM

## 2014-08-03 DIAGNOSIS — Z87448 Personal history of other diseases of urinary system: Secondary | ICD-10-CM | POA: Insufficient documentation

## 2014-08-03 DIAGNOSIS — Z87891 Personal history of nicotine dependence: Secondary | ICD-10-CM | POA: Insufficient documentation

## 2014-08-03 DIAGNOSIS — K089 Disorder of teeth and supporting structures, unspecified: Secondary | ICD-10-CM | POA: Insufficient documentation

## 2014-08-03 DIAGNOSIS — Z8679 Personal history of other diseases of the circulatory system: Secondary | ICD-10-CM | POA: Insufficient documentation

## 2014-08-03 MED ORDER — OXYCODONE-ACETAMINOPHEN 5-325 MG PO TABS
2.0000 | ORAL_TABLET | Freq: Once | ORAL | Status: AC
  Administered 2014-08-03: 2 via ORAL
  Filled 2014-08-03: qty 2

## 2014-08-03 MED ORDER — PENICILLIN V POTASSIUM 500 MG PO TABS: 500.0000 mg | ORAL_TABLET | Freq: Four times a day (QID) | ORAL | Status: DC

## 2014-08-03 MED ORDER — OXYCODONE-ACETAMINOPHEN 5-325 MG PO TABS: 1.0000 | ORAL_TABLET | ORAL | Status: DC | PRN

## 2014-08-03 MED ORDER — PENICILLIN V POTASSIUM 500 MG PO TABS
500.0000 mg | ORAL_TABLET | Freq: Once | ORAL | Status: AC
  Administered 2014-08-03: 500 mg via ORAL
  Filled 2014-08-03: qty 1

## 2014-08-03 NOTE — ED Provider Notes (Signed)
CSN: [id number]     Arrival date & time 08/03/14  0410 History   First MD Initiated Contact with Patient 08/03/14 [phone]     Chief Complaint  Patient presents with  . Dental Pain     (Consider location/radiation/quality/duration/timing/severity/associated sxs/prior Treatment) HPI Comments: Patient is 33 year old female who presents with dental pain that started gradually last night. The dental pain is severe, constant and progressively worsening. The pain is aching and located in right upper jaw. The pain does not radiate. Eating makes the pain worse. Nothing makes the pain better. The patient has tried ibuprofen, naprosyn and tylenol for pain without relief. No associated symptoms. Patient denies headache, neck pain/stiffness, fever, NVD, edema, sore throat, throat swelling, wheezing, SOB, chest pain, abdominal pain.     Patient is a 33 y.o. female presenting with tooth pain.  Dental Pain Associated symptoms: no fever and no neck pain     Past Medical History  Diagnosis Date  . Asthma   . Asthma   . Preterm labor     1st pregnancy d/t pre-e  . Pregnancy induced hypertension     previous pregnancy  . Pyelonephritis     June 2013  . Varicose veins    Past Surgical History  Procedure Laterality Date  . No past surgeries     Family History  Problem Relation Age of Onset  . Anesthesia problems Neg Hx   . Hypotension Neg Hx   . Malignant hyperthermia Neg Hx   . Pseudochol deficiency Neg Hx   . Other Neg Hx   . Diabetes Mother   . Asthma Father    History  Substance Use Topics  . Smoking status: Former Games developer  . Smokeless tobacco: Never Used     Comment: yrs ago  . Alcohol Use: No     Comment: a little when not pregnant   OB History   Grav Para Term Preterm Abortions TAB SAB Ect Mult Living   0 0 0 0 0 3     Review of Systems  Constitutional: Negative for fever, chills and fatigue.  HENT: Positive for dental problem. Negative for trouble swallowing.    Eyes: Negative for visual disturbance.  Respiratory: Negative for shortness of breath.   Cardiovascular: Negative for chest pain and palpitations.  Gastrointestinal: Negative for nausea, vomiting, abdominal pain and diarrhea.  Genitourinary: Negative for dysuria and difficulty urinating.  Musculoskeletal: Negative for arthralgias and neck pain.  Skin: Negative for color change.  Neurological: Negative for dizziness and weakness.  Psychiatric/Behavioral: Negative for dysphoric mood.      Allergies  Iohexol  Home Medications   Prior to Admission medications   Medication Sig Start Date End Date Taking? Authorizing Provider  acetaminophen (TYLENOL) 500 MG tablet Take 1,000 mg by mouth every 4 (four) hours as needed for moderate pain.   Yes Historical Provider, MD  Ibuprofen-Diphenhydramine HCl (ADVIL PM) 200-25 MG CAPS Take 2 capsules by mouth at bedtime.   Yes Historical Provider, MD   BP 112/67  Pulse 74  Temp(Src) 97.6 F (36.4 C) (Oral)  Resp 17  Wt 125 lb (56.7 kg)  SpO2 98%  LMP 06/30/2014  Breastfeeding? No Physical Exam  Nursing note and vitals reviewed. Constitutional: She is oriented to person, place, and time. She appears well-developed and well-nourished. No distress.  HENT:  Head: Normocephalic and atraumatic.  Fair dentition. Right upper molar tender to percussion. No abscess noted. No submandibular swelling.   Eyes: Conjunctivae  and EOM are normal.  Neck: Normal range of motion.  Cardiovascular: Normal rate and regular rhythm.  Exam reveals no gallop and no friction rub.   No murmur heard. Pulmonary/Chest: Effort normal and breath sounds normal. She has no wheezes. She has no rales. She exhibits no tenderness.  Abdominal: Soft. She exhibits no distension. There is no tenderness. There is no rebound.  Musculoskeletal: Normal range of motion.  Neurological: She is alert and oriented to person, place, and time. Coordination normal.  Speech is goal-oriented.  Moves limbs without ataxia.   Skin: Skin is warm and dry.  Psychiatric: She has a normal mood and affect. Her behavior is normal.    ED Course  Procedures (including critical care time) Labs Review Labs Reviewed - No data to display  Imaging Review No results found.   EKG Interpretation None      MDM   Final diagnoses:  Pain, dental    4:51 AM Patient will have Percocet and Veetid here. Patient will be discharged with prescriptions for the same. Patient will have dental referral. No signs of ludwigs angina or dental abscess.   Emilia Beck, PA-C 08/03/14 0501

## 2014-08-03 NOTE — ED Notes (Signed)
Pt c/o R sided dental pain onset last night. Tonight pt has taken 2 tylenol, 2 Aleve, 2 ibuprofen and 4 Advil PM (Ibuprofen , diphenhydramine  each)

## 2014-08-03 NOTE — Discharge Instructions (Signed)
Take Percocet as needed for pain. Take Veetid as directed until gone. Follow up with the recommended dentist for further evaluation. Refer to attached documents for more information.

## 2014-08-03 NOTE — ED Provider Notes (Signed)
Medical screening examination/treatment/procedure(s) were performed by non-physician practitioner and as supervising physician I was immediately available for consultation/collaboration.   EKG Interpretation None        Soila Printup M Ragena Fiola, MD 08/03/14 0841 

## 2014-09-28 ENCOUNTER — Emergency Department (HOSPITAL_COMMUNITY)
Admission: EM | Admit: 2014-09-28 | Discharge: 2014-09-28 | Disposition: A | Discharge status: Home / Self Care | Payer: Medicaid Other | Attending: Emergency Medicine | Admitting: Emergency Medicine

## 2014-09-28 ENCOUNTER — Encounter (HOSPITAL_COMMUNITY): Payer: Self-pay | Admitting: Emergency Medicine

## 2014-09-28 DIAGNOSIS — Z87891 Personal history of nicotine dependence: Secondary | ICD-10-CM | POA: Insufficient documentation

## 2014-09-28 DIAGNOSIS — N8110 Cystocele, unspecified: Secondary | ICD-10-CM | POA: Insufficient documentation

## 2014-09-28 DIAGNOSIS — N816 Rectocele: Secondary | ICD-10-CM | POA: Insufficient documentation

## 2014-09-28 DIAGNOSIS — Z3202 Encounter for pregnancy test, result negative: Secondary | ICD-10-CM | POA: Insufficient documentation

## 2014-09-28 DIAGNOSIS — Z792 Long term (current) use of antibiotics: Secondary | ICD-10-CM | POA: Insufficient documentation

## 2014-09-28 DIAGNOSIS — Z8679 Personal history of other diseases of the circulatory system: Secondary | ICD-10-CM | POA: Insufficient documentation

## 2014-09-28 DIAGNOSIS — IMO0002 Reserved for concepts with insufficient information to code with codable children: Secondary | ICD-10-CM

## 2014-09-28 DIAGNOSIS — J45909 Unspecified asthma, uncomplicated: Secondary | ICD-10-CM | POA: Insufficient documentation

## 2014-09-28 LAB — COMPREHENSIVE METABOLIC PANEL
ALT: 25 U/L (ref 0–35)
ANION GAP: 12 (ref 5–15)
AST: 32 U/L (ref 0–37)
Albumin: 3.7 g/dL (ref 3.5–5.2)
Alkaline Phosphatase: 165 U/L — ABNORMAL HIGH (ref 39–117)
BILIRUBIN TOTAL: 0.2 mg/dL — AB (ref 0.3–1.2)
BUN: 12 mg/dL (ref 6–23)
CO2: 24 mEq/L (ref 19–32)
Calcium: 9 mg/dL (ref 8.4–10.5)
Chloride: 97 mEq/L (ref 96–112)
Creatinine, Ser: 0.89 mg/dL (ref 0.50–1.10)
GFR calc non Af Amer: 84 mL/min — ABNORMAL LOW (ref >90)
GLUCOSE: 97 mg/dL (ref 70–99)
Potassium: 3.6 mEq/L — ABNORMAL LOW (ref 3.7–5.3)
Sodium: 133 mEq/L — ABNORMAL LOW (ref 137–147)
TOTAL PROTEIN: 7.5 g/dL (ref 6.0–8.3)

## 2014-09-28 LAB — URINALYSIS, ROUTINE W REFLEX MICROSCOPIC
Bilirubin Urine: NEGATIVE
Glucose, UA: NEGATIVE mg/dL
Ketones, ur: NEGATIVE mg/dL
Leukocytes, UA: NEGATIVE
NITRITE: NEGATIVE
Protein, ur: NEGATIVE mg/dL
Specific Gravity, Urine: 1.016 (ref 1.005–1.030)
UROBILINOGEN UA: 1 mg/dL (ref 0.0–1.0)
pH: 6 (ref 5.0–8.0)

## 2014-09-28 LAB — CBC WITH DIFFERENTIAL/PLATELET
BASOS ABS: 0 10*3/uL (ref 0.0–0.1)
Basophils Relative: 0 % (ref 0–1)
Eosinophils Absolute: 0.2 10*3/uL (ref 0.0–0.7)
Eosinophils Relative: 3 % (ref 0–5)
HCT: 37.5 % (ref 36.0–46.0)
HEMOGLOBIN: 12.8 g/dL (ref 12.0–15.0)
Lymphocytes Relative: 33 % (ref 12–46)
Lymphs Abs: 2.5 10*3/uL (ref 0.7–4.0)
MCH: 29.8 pg (ref 26.0–34.0)
MCHC: 34.1 g/dL (ref 30.0–36.0)
MCV: 87.2 fL (ref 78.0–100.0)
Monocytes Absolute: 0.5 10*3/uL (ref 0.1–1.0)
Monocytes Relative: 6 % (ref 3–12)
NEUTROS ABS: 4.5 10*3/uL (ref 1.7–7.7)
NEUTROS PCT: 58 % (ref 43–77)
Platelets: 355 10*3/uL (ref 150–400)
RBC: 4.3 MIL/uL (ref 3.87–5.11)
RDW: 13.7 % (ref 11.5–15.5)
WBC: 7.8 10*3/uL (ref 4.0–10.5)

## 2014-09-28 LAB — URINE MICROSCOPIC-ADD ON

## 2014-09-28 LAB — PREGNANCY, URINE: PREG TEST UR: NEGATIVE

## 2014-09-28 NOTE — Discharge Instructions (Signed)
1. Medications: usual home medications 2. Treatment: rest, drink plenty of fluids,  3. Follow Up: Please followup with your primary doctor for discussion of your diagnoses and further evaluation after today's visit; if you do not have a primary care doctor use the resource guide provided to find one;     Reparacin del cistocele (Cystocele Repair) La reparacin del cistocele es un procedimiento quirrgico para extirpar un cistocele que es un bulto, una zona que cae (hernia) de la vejiga y que se extiende hacia la vagina. Este abultamiento o protrusin se produce en la pared anterior de la vagina. INFORME A SU MDICO:   Cualquier alergia que tenga.  Todos los [place] Energymedicamentos que [place]iceutiliza, incluyendo vitaminas, hierbas, gotas oftlmicas, cremas y [address]medicamentos de [address]a libre.  Uso de corticoides (por va oral o cremas).  Problemas previos que usted o los Graybar Electricmiembros de su familia hayan tenido con el uso de anestsicos.  Enfermedades de Clear Channel Communicationsla sangre.  Cirugas previas.  Padecimientos mdicos.  Posibilidad de embarazo, si correspondiera. RIESGOS Y COMPLICACIONES  Generalmente, ste es un procedimiento seguro. Sin embargo, Tree surgeoncomo en cualquier procedimiento, pueden surgir complicaciones. Las complicaciones posibles son:  Sharlyne PacasSangrado excesivo.  Infeccin.  Lesin en los rganos circundantes.  Problemas relacionados con la anestesia. Los riesgos podrn variar segn el tipo de anestesia administrada.  Problemas con el catter urinario despus de la ciruga, como una obstruccin.  Reaparicin del cistocele. ANTES DEL PROCEDIMIENTO   Consulte a su mdico si debe cambiar o suspender los medicamentos que toma habitualmente. Es posible que deba dejar de tomar ciertos medicamentos antes de la Azerba[place]uga.  No coma ni beba nada despus de la medianoche anterior a la ciruga.  Si fuma, no lo haga al Foot Lockermenos en las dos semanas previas a la Azerbaijanciruga.  No beba alcohol los 3 [address]das anteriores a la  Azerba[place]anciruga.  Pdale a alguna persona que la lleve a su casa despus de la hospitalizacin y que la ayude con las actividades durante la recuperacin. PROCEDIMIENTO   Le aplicarn un medicamento que la har dormir durante el procedimiento (anestesia general) o le inyectarn un medicamento para adormecer la zona de la cintura para abajo (anestesia espinal) o anestesia epidural. Usted dormir o tendr el cuerpo adormecido durante todo el procedimiento.  Le colocarn un tubo delgado y flexible (catter Foley) en la vejiga para drenar la orina durante y despus de la Azerbaijanciruga.  Esta ciruga se realiza a travs de la vagina. La pared anterior de la vagina se abre, y el msculo que se encuentra entre la vejiga y la vagina se vuelve hacia su posicin normal. Esto se refuerza con puntos o un trozo de [place]malla. De este modo se extirpa la hernia y la parte superior de la vagina no caer en la abertura de la misma.  El corte en la pared anterior de la vagina se cierra con puntos que se reabsorben y no necesitan quitarse. DESPUS DEL PROCEDIMIENTO   La llevarn al rea de recuperacin donde controlarn su evolucin de cerca. Le controlarn con frecuencia la respiracin, la presin arterial y el pulso (signos vitales). Cuando se encuentre estable, ser llevada a una habitacin comn del hospital.  Tendr colocado un catter para drenar la vejiga. Este se Database administratordejar en el lugar durante 2 a 7 das o hasta que la vejiga funcione adecuadamente por s misma.  Tendr Neomia Dearuna gasa en la vagina. Esta se retirar en 1 o 2 das despus de la Azerbaijanciruga.  Le darn medicamentos para calmar el dolor segn sea  necesario y podrn indicarle medicamentos para destruir los grmenes (antibiticos).  Ser necesario que Administrator, Civil Servicepermanezca en el hospital durante 1 - 2 das. Document Released: 11/28/2005 Document Revised: 09/18/2013 [facility] Patient Information 2015 [place]Care, [place]. This information is not intended to replace advice given to you by your  health care provider. Make sure you discuss any questions you have with your health care provider.

## 2014-09-28 NOTE — ED Notes (Signed)
Furthermore, adding to ED note, it appears patient may have a bladder or cervical  prolapse. Pt reports this occurred around 7 months ago when her last child was born.

## 2014-09-28 NOTE — ED Notes (Signed)
Pt presents with c/o lower abdominal pain that started several weeks ago and burning with urination that she reports has been going on for about 3 months. Pt denies any blood in her urine, N/V/D.

## 2014-09-28 NOTE — ED Provider Notes (Signed)
CSN: [id number]     Arrival date & time 09/28/14  2015 History   First MD Initiated Contact with Patient 09/28/14 2131     Chief Complaint  Patient presents with  . Abdominal Pain  . Burning with urination      (Consider location/radiation/quality/duration/timing/severity/associated sxs/prior Treatment) Patient is a 33 y.o. female presenting with abdominal pain. The history is provided by the patient and medical records. No language interpreter was used.  Abdominal Pain Associated symptoms: no chest pain, no constipation, no cough, no diarrhea, no dysuria, no fatigue, no fever, no hematuria, no nausea, no shortness of breath and no vomiting     Cathy Nash is a 33 y.o. female  G3P3 with a hx of  presents to the Emergency Department complaining of gradual, persistent, progressively worsening vaginal pain worse with ambulation onset 3 months ago, occurring several months after the birth of her third child.  Patient reports no pain unless she is walking which causes irritation. She also reports frequent urination with some burning.  Nursing to report lower abdominal pain however patient denies any abdominal pain, nausea, vomiting, fever, chills. She has not sought medical treatment for this in the past.  Walking makes the symptoms worse and lying makes them better.  Past Medical History  Diagnosis Date  . Asthma   . Asthma   . Preterm labor     1st pregnancy d/t pre-e  . Pregnancy induced hypertension     previous pregnancy  . Pyelonephritis     June 2013  . Varicose veins    Past Surgical History  Procedure Laterality Date  . No past surgeries     Family History  Problem Relation Age of Onset  . Anesthesia problems Neg Hx   . Hypotension Neg Hx   . Malignant hyperthermia Neg Hx   . Pseudochol deficiency Neg Hx   . Other Neg Hx   . Diabetes Mother   . Asthma Father    History  Substance Use Topics  . Smoking status: Former Games developermoker  . Smokeless tobacco: Never Used      Comment: yrs ago  . Alcohol Use: No     Comment: a little when not pregnant   OB History   Grav Para Term Preterm Abortions TAB SAB Ect Mult Living   3 3 2 1  0 0 0 0 0 3     Review of Systems  Constitutional: Negative for fever, diaphoresis, appetite change, fatigue and unexpected weight change.  HENT: Negative for mouth sores.   Eyes: Negative for visual disturbance.  Respiratory: Negative for cough, chest tightness, shortness of breath and wheezing.   Cardiovascular: Negative for chest pain.  Gastrointestinal: Negative for nausea, vomiting, abdominal pain, diarrhea and constipation.  Endocrine: Negative for polydipsia, polyphagia and polyuria.  Genitourinary: Positive for vaginal pain. Negative for dysuria, urgency, frequency and hematuria.  Musculoskeletal: Negative for back pain and neck stiffness.  Skin: Negative for rash.  Allergic/Immunologic: Negative for immunocompromised state.  Neurological: Negative for syncope, light-headedness and headaches.  Hematological: Does not bruise/bleed easily.  Psychiatric/Behavioral: Negative for sleep disturbance. The patient is not nervous/anxious.       Allergies  Iohexol  Home Medications   Prior to Admission medications   Medication Sig Start Date End Date Taking? Authorizing Provider  acetaminophen (TYLENOL) 500 MG tablet Take 1,000 mg by mouth every 4 (four) hours as needed for moderate pain.    Historical Provider, MD  Ibuprofen-Diphenhydramine HCl (ADVIL PM) 200-25 MG CAPS Take 2  capsules by mouth at bedtime.    Historical Provider, MD  oxyCODONE-acetaminophen (PERCOCET/ROXICET) 5-325 MG per tablet Take 1-2 tablets by mouth every 4 (four) hours as needed for moderate pain or severe pain. 08/03/14   Kaitlyn Szekalski, PA-C  penicillin v potassium (VEETID) 500 MG tablet Take 1 tablet (500 mg total) by mouth 4 (four) times daily. 08/03/14   Kaitlyn Szekalski, PA-C   BP 132/79  Pulse 82  Temp(Src) 97.6 F (36.4 C) (Oral)   Resp 19  Ht 5\' 4"  (1.626 m)  Wt 125 lb (56.7 kg)  BMI 21.45 kg/m2  SpO2 99%  LMP 08/29/2014 Physical Exam  Nursing note and vitals reviewed. Constitutional: She appears well-developed and well-nourished. No distress.  HENT:  Head: Normocephalic and atraumatic.  Eyes: Conjunctivae are normal.  Neck: Normal range of motion.  Cardiovascular: Normal rate, regular rhythm, normal heart sounds and intact distal pulses.   No murmur heard. Pulmonary/Chest: Effort normal and breath sounds normal. No respiratory distress. She has no wheezes.  Abdominal: Soft. Bowel sounds are normal. There is no tenderness. There is no rebound and no guarding. Hernia confirmed negative in the right inguinal area and confirmed negative in the left inguinal area.  Genitourinary: Uterus normal. No labial fusion. There is no rash, tenderness or lesion on the right labia. There is no rash, tenderness or lesion on the left labia. Uterus is not deviated, not enlarged, not fixed and not tender. Cervix exhibits no motion tenderness, no discharge and no friability. Right adnexum displays no mass, no tenderness and no fullness. Left adnexum displays no mass, no tenderness and no fullness. No erythema, tenderness or bleeding around the vagina. No foreign body around the vagina. No signs of injury around the vagina. No vaginal discharge found.  Very large cystocele and moderate-sized rectocele; no evidence of uterus prolapse, but prolapsing of vaginal walls  Musculoskeletal: Normal range of motion. She exhibits no edema.  Lymphadenopathy:       Right: No inguinal adenopathy present.       Left: No inguinal adenopathy present.  Neurological: She is alert.  Skin: Skin is warm and dry. She is not diaphoretic. No erythema.  Psychiatric: She has a normal mood and affect.    ED Course  Procedures (including critical care time) Labs Review Labs Reviewed  COMPREHENSIVE METABOLIC PANEL - Abnormal; Notable for the following:     Sodium 133 (*)    Potassium 3.6 (*)    Alkaline Phosphatase 165 (*)    Total Bilirubin 0.2 (*)    GFR calc non Af Amer 84 (*)    All other components within normal limits  URINALYSIS, ROUTINE W REFLEX MICROSCOPIC - Abnormal; Notable for the following:    APPearance CLOUDY (*)    Hgb urine dipstick SMALL (*)    All other components within normal limits  URINE MICROSCOPIC-ADD ON - Abnormal; Notable for the following:    Squamous Epithelial / LPF FEW (*)    Bacteria, UA FEW (*)    All other components within normal limits  CBC WITH DIFFERENTIAL  PREGNANCY, URINE    Imaging Review No results found.   EKG Interpretation None      MDM   Final diagnoses:  Cystocele  Rectocele    Cathy Nash presents with vaginal pain and exam consistent with a large cystocele and moderate-sized rectocele. No evidence of cervical prolapse there is some prolapsing of the vaginal walls.  No evidence of urinary tract infection. Abdomen is soft and nontender. Patient  to be discharged with close followup with OB/GYN for discussion of bladder tack or other surgical repair.    BP 132/79  Pulse 82  Temp(Src) 97.6 F (36.4 C) (Oral)  Resp 19  Ht 5\' 4"  (1.626 m)  Wt 125 lb (56.7 kg)  BMI 21.45 kg/m2  SpO2 99%  LMP 08/29/2014    Dierdre ForthHannah Amar Sippel, PA-C 09/28/14 2254

## 2014-09-29 NOTE — ED Provider Notes (Signed)
Medical screening examination/treatment/procedure(s) were performed by non-physician practitioner and as supervising physician I was immediately available for consultation/collaboration.  Toy CookeyMegan Docherty, MD 09/29/14 [phone]

## 2014-10-13 ENCOUNTER — Encounter (HOSPITAL_COMMUNITY): Payer: Self-pay | Admitting: Emergency Medicine

## 2014-10-30 ENCOUNTER — Encounter (HOSPITAL_COMMUNITY): Payer: Self-pay | Admitting: *Deleted

## 2014-10-30 ENCOUNTER — Inpatient Hospital Stay (HOSPITAL_COMMUNITY)
Admission: AD | Admit: 2014-10-30 | Discharge: 2014-10-30 | Disposition: A | Discharge status: Home / Self Care | Payer: Medicaid Other | Source: Ambulatory Visit | Attending: Obstetrics & Gynecology | Admitting: Obstetrics & Gynecology

## 2014-10-30 DIAGNOSIS — N816 Rectocele: Secondary | ICD-10-CM | POA: Insufficient documentation

## 2014-10-30 DIAGNOSIS — IMO0002 Reserved for concepts with insufficient information to code with codable children: Secondary | ICD-10-CM

## 2014-10-30 DIAGNOSIS — B373 Candidiasis of vulva and vagina: Secondary | ICD-10-CM | POA: Insufficient documentation

## 2014-10-30 DIAGNOSIS — Z87891 Personal history of nicotine dependence: Secondary | ICD-10-CM | POA: Insufficient documentation

## 2014-10-30 DIAGNOSIS — N8110 Cystocele, unspecified: Secondary | ICD-10-CM | POA: Insufficient documentation

## 2014-10-30 DIAGNOSIS — B3731 Acute candidiasis of vulva and vagina: Secondary | ICD-10-CM

## 2014-10-30 LAB — URINALYSIS, ROUTINE W REFLEX MICROSCOPIC
BILIRUBIN URINE: NEGATIVE
Glucose, UA: NEGATIVE mg/dL
KETONES UR: NEGATIVE mg/dL
Nitrite: NEGATIVE
PH: 6 (ref 5.0–8.0)
Protein, ur: NEGATIVE mg/dL
Specific Gravity, Urine: 1.01 (ref 1.005–1.030)
Urobilinogen, UA: 0.2 mg/dL (ref 0.0–1.0)

## 2014-10-30 LAB — POCT PREGNANCY, URINE: Preg Test, Ur: NEGATIVE

## 2014-10-30 LAB — URINE MICROSCOPIC-ADD ON

## 2014-10-30 LAB — WET PREP, GENITAL
Clue Cells Wet Prep HPF POC: NONE SEEN
TRICH WET PREP: NONE SEEN

## 2014-10-30 MED ORDER — FLUCONAZOLE 150 MG PO TABS: ORAL_TABLET | ORAL | Status: DC

## 2014-10-30 MED ORDER — FLUCONAZOLE 150 MG PO TABS
150.0000 mg | ORAL_TABLET | Freq: Once | ORAL | Status: AC
  Administered 2014-10-30: 150 mg via ORAL
  Filled 2014-10-30: qty 1

## 2014-10-30 NOTE — MAU Provider Note (Signed)
History     CSN: [id number]  Arrival date and time: 10/30/14 1012   First Provider Initiated Contact with Patient 10/30/14 1121      Chief Complaint  Patient presents with  . Vaginitis   HPI Pt is not pregnant G3P2013 delivered 8 mos ago.  Pt c/o bulge in vagina that causes her much pressure and pain and discomfort with intercourse.  Pt says she has had this since delivery. Pt did not go to 6 weeks post partum visit - pt said she was feeling fine. Pt also c/o of white discharge with itching for about 1 week. Pt is not using anything for contraception.   MAU Note 10/30/2014 10:21 AM    Expand All Collapse All   Is 8 months postpartum- delivered vaginally; c/o hematoma in vagina; c/o UTI symptoms and vaginal itching; c/o white "jello" discharge for past 1 week; did not go to her 6 weeks postpartum visist because she was feeling fine;        Past Medical History  Diagnosis Date  . Asthma   . Asthma   . Preterm labor     1st pregnancy d/t pre-e  . Pregnancy induced hypertension     previous pregnancy  . Pyelonephritis     June 2013  . Varicose veins     Past Surgical History  Procedure Laterality Date  . No past surgeries      Family History  Problem Relation Age of Onset  . Anesthesia problems Neg Hx   . Hypotension Neg Hx   . Malignant hyperthermia Neg Hx   . Pseudochol deficiency Neg Hx   . Other Neg Hx   . Diabetes Mother   . Asthma Father     History  Substance Use Topics  . Smoking status: Former Games developermoker  . Smokeless tobacco: Never Used     Comment: yrs ago  . Alcohol Use: No     Comment: a little when not pregnant    Allergies:  Allergies  Allergen Reactions  . Iohexol Hives and Shortness Of Breath     Code: SOB, Desc: IMMEDIATELY SOB FOLLOWING IV INJECTION, SPOTTY HIVES, PT GIVEN BENADRYL AND EPI-ARS 08/22/09, Onset Date: [id number]     Prescriptions prior to admission  Medication Sig Dispense Refill Last Dose  . acetaminophen (TYLENOL) 500  MG tablet Take 1,000 mg by mouth every 4 (four) hours as needed for moderate pain.   Past Week at Unknown time  . Ibuprofen-Diphenhydramine HCl (ADVIL PM) 200-25 MG CAPS Take 2 capsules by mouth at bedtime as needed (for pain/sleep).    10/16/2014  . oxyCODONE-acetaminophen (PERCOCET/ROXICET) 5-325 MG per tablet Take 1-2 tablets by mouth every 4 (four) hours as needed for moderate pain or severe pain. (Patient not taking: Reported on 10/30/2014) 30 tablet 0   . penicillin v potassium (VEETID) 500 MG tablet Take 1 tablet (500 mg total) by mouth 4 (four) times daily. (Patient not taking: Reported on 10/30/2014) 40 tablet 0     ROS Physical Exam   Blood pressure 118/55, pulse 75, temperature 98.4 F (36.9 C), temperature source Oral, resp. rate 16, not currently breastfeeding.  Physical Exam  Nursing note and vitals reviewed. Constitutional: She is oriented to person, place, and time. She appears well-developed and well-nourished. No distress.  HENT:  Head: Normocephalic.  Eyes: Pupils are equal, round, and reactive to light.  Neck: Normal range of motion. Neck supple.  Cardiovascular: Normal rate.   Respiratory: Effort normal.  GI: Soft. She exhibits  no distension. There is no tenderness. There is no rebound.  Genitourinary:  Reddened vaginal mucosa; small-mod amount of clumpy white discharge in vault; cervix clean, NT; uterus NSSC NT; mildly tender left adnexa without rebound; right adnexa without palpable enlargement or tenderness Cyctocele and rectocele noted  Musculoskeletal: Normal range of motion.  Neurological: She is alert and oriented to person, place, and time.  Skin: Skin is warm and dry.  Psychiatric: She has a normal mood and affect.    MAU Course  Procedures Wet prep GC/chlamydia- pending Diflucan 150mg  one PO given in MAU Results for orders placed or performed during the hospital encounter of 10/30/14 (from the past 24 hour(s))  Urinalysis, Routine w reflex  microscopic     Status: Abnormal   Collection Time: 10/30/14 10:20 AM  Result Value Ref Range   Color, Urine YELLOW YELLOW   APPearance CLEAR CLEAR   Specific Gravity, Urine 1.010 1.005 - 1.030   pH 6.0 5.0 - 8.0   Glucose, UA NEGATIVE NEGATIVE mg/dL   Hgb urine dipstick SMALL (A) NEGATIVE   Bilirubin Urine NEGATIVE NEGATIVE   Ketones, ur NEGATIVE NEGATIVE mg/dL   Protein, ur NEGATIVE NEGATIVE mg/dL   Urobilinogen, UA 0.2 0.0 - 1.0 mg/dL   Nitrite NEGATIVE NEGATIVE   Leukocytes, UA MODERATE (A) NEGATIVE  Urine microscopic-add on     Status: Abnormal   Collection Time: 10/30/14 10:20 AM  Result Value Ref Range   Squamous Epithelial / LPF RARE RARE   WBC, UA 7-10 <3 WBC/hpf   RBC / HPF 3-6 <3 RBC/hpf   Bacteria, UA FEW (A) RARE  Pregnancy, urine POC     Status: None   Collection Time: 10/30/14 10:47 AM  Result Value Ref Range   Preg Test, Ur NEGATIVE NEGATIVE  Wet prep, genital     Status: Abnormal   Collection Time: 10/30/14 11:30 AM  Result Value Ref Range   Yeast Wet Prep HPF POC TOO NUMEROUS TO COUNT (A) NONE SEEN   Trich, Wet Prep NONE SEEN NONE SEEN   Clue Cells Wet Prep HPF POC NONE SEEN NONE SEEN   WBC, Wet Prep HPF POC MODERATE (A) NONE SEEN   Assessment and Plan  Yeast vaginitis- Diflucan 150mg   in MAU and RX Cystocele and rectocele- f/u with Dr. Marice Potterove- [facility]  Avaeh Ewer 10/30/2014, 11:22 AM

## 2014-10-30 NOTE — MAU Note (Signed)
Is 8 months postpartum- delivered vaginally; c/o hematoma in vagina; c/o UTI symptoms and vaginal itching; c/o white "jello" discharge for past 1 week; did not go to her 6 weeks postpartum visist because she was feeling fine;

## 2014-10-31 LAB — GC/CHLAMYDIA PROBE AMP
CT Probe RNA: NEGATIVE
GC PROBE AMP APTIMA: NEGATIVE

## 2014-12-12 NOTE — L&D Delivery Note (Cosign Needed)
Delivery Note Labor finally kicked in, and pt progressed to C/C/+2 with an urge to push.  At 10:44 PM a viable female was delivered via Vaginal, Spontaneous Delivery (Presentation: LOA  ). After 2 minutes, the cord was clamped and cut. 40 units of pitocin diluted in 1000cc LR was infused rapidly IV.  The placenta separated spontaneously and delivered via CCT and maternal pushing effort.  It was inspected and appears to be intact with a 3 VC.   APGAR: 9/9 ; weight  pending  Anesthesia: Epidural  Episiotomy: None Lacerations: None Suture Repair: n/a Est. Blood Loss (mL): 100  Mom to postpartum.  Baby to NICU.  CRESENZO-DISHMAN,Jeni Duling 10/15/2015, 11:07 PM

## 2014-12-13 ENCOUNTER — Encounter (HOSPITAL_COMMUNITY): Payer: Self-pay | Admitting: Emergency Medicine

## 2014-12-13 ENCOUNTER — Emergency Department (HOSPITAL_COMMUNITY)
Admission: EM | Admit: 2014-12-13 | Discharge: 2014-12-13 | Disposition: A | Discharge status: Home / Self Care | Payer: Medicaid Other | Attending: Emergency Medicine | Admitting: Emergency Medicine

## 2014-12-13 DIAGNOSIS — J069 Acute upper respiratory infection, unspecified: Secondary | ICD-10-CM

## 2014-12-13 DIAGNOSIS — H6692 Otitis media, unspecified, left ear: Secondary | ICD-10-CM | POA: Insufficient documentation

## 2014-12-13 DIAGNOSIS — Z8742 Personal history of other diseases of the female genital tract: Secondary | ICD-10-CM | POA: Insufficient documentation

## 2014-12-13 DIAGNOSIS — Z8679 Personal history of other diseases of the circulatory system: Secondary | ICD-10-CM | POA: Insufficient documentation

## 2014-12-13 DIAGNOSIS — Z87891 Personal history of nicotine dependence: Secondary | ICD-10-CM | POA: Insufficient documentation

## 2014-12-13 DIAGNOSIS — J45909 Unspecified asthma, uncomplicated: Secondary | ICD-10-CM | POA: Insufficient documentation

## 2014-12-13 DIAGNOSIS — Z79899 Other long term (current) drug therapy: Secondary | ICD-10-CM | POA: Insufficient documentation

## 2014-12-13 MED ORDER — HYDROCODONE-ACETAMINOPHEN 5-325 MG PO TABS: 1.0000 | ORAL_TABLET | Freq: Four times a day (QID) | ORAL | Status: DC | PRN

## 2014-12-13 MED ORDER — AMOXICILLIN 500 MG PO CAPS: 1000.0000 mg | ORAL_CAPSULE | Freq: Three times a day (TID) | ORAL | Status: DC

## 2014-12-13 MED ORDER — ONDANSETRON 8 MG PO TBDP: 8.0000 mg | ORAL_TABLET | Freq: Three times a day (TID) | ORAL | Status: DC | PRN

## 2014-12-13 MED ORDER — AMOXICILLIN 500 MG PO CAPS
1000.0000 mg | ORAL_CAPSULE | Freq: Once | ORAL | Status: AC
  Administered 2014-12-13: 1000 mg via ORAL
  Filled 2014-12-13: qty 2

## 2014-12-13 MED ORDER — FLUCONAZOLE 150 MG PO TABS: ORAL_TABLET | ORAL | Status: DC

## 2014-12-13 NOTE — ED Notes (Addendum)
Pt reports bilateral earache for the past 3 days. Pt reports she feels like she began to run a fever tonight. Pt afebrile in triage but reports taking multiple OTCs over the ast 3 days for pain and fever. Nyquil last taken at 1200. Pt also states everyone in the family has a cough and "we are taking the baby's medicine." Pt states the medicine is for cough and runny nose.

## 2014-12-13 NOTE — ED Provider Notes (Signed)
CSN: [id number]     Arrival date & time 12/13/14  0405 History   First MD Initiated Contact with Patient 12/13/14 0448     Chief Complaint  Patient presents with  . Earache      (Consider location/radiation/quality/duration/timing/severity/associated sxs/prior Treatment) HPI  This is a 34 year old female with a three-day history of cold symptoms. Specifically she is having nasal congestion, facial pain, cough and vomiting. Yesterday she developed a fever and pain in her ears, left greater than right. She has been taking over-the-counter medications which are no longer adequately relieving her symptoms. She states when she coughs she has incontinence of urine a problem she has had in the past. Her pain is severe enough to have her in tears.  Past Medical History  Diagnosis Date  . Asthma   . Asthma   . Preterm labor     1st pregnancy d/t pre-e  . Pregnancy induced hypertension     previous pregnancy  . Pyelonephritis     June 2013  . Varicose veins    Past Surgical History  Procedure Laterality Date  . No past surgeries     Family History  Problem Relation Age of Onset  . Anesthesia problems Neg Hx   . Hypotension Neg Hx   . Malignant hyperthermia Neg Hx   . Pseudochol deficiency Neg Hx   . Other Neg Hx   . Diabetes Mother   . Asthma Father    History  Substance Use Topics  . Smoking status: Former Games developer  . Smokeless tobacco: Never Used     Comment: yrs ago  . Alcohol Use: No     Comment: a little when not pregnant   OB History    Gravida Para Term Preterm AB TAB SAB Ectopic Multiple Living   0 0 0 0 0 3     Review of Systems  All other systems reviewed and are negative.   Allergies  Iohexol  Home Medications   Prior to Admission medications   Medication Sig Start Date End Date Taking? Authorizing Provider  acetaminophen (TYLENOL) 500 MG tablet Take 1,000 mg by mouth every 4 (four) hours as needed for moderate pain.   Yes Historical Provider, MD   DM-Doxylamine-Acetaminophen (NYQUIL COLD & FLU PO) Take 15 mLs by mouth 2 (two) times daily.   Yes Historical Provider, MD  fluconazole (DIFLUCAN) 150 MG tablet Take one tablet on Sunday May repeat again on Wednesday if continued symptoms Patient not taking: Reported on 12/13/2014 10/30/14   Jean Rosenthal, NP  Ibuprofen-Diphenhydramine HCl (ADVIL PM) 200-25 MG CAPS Take 2 capsules by mouth at bedtime as needed (for pain/sleep).     Historical Provider, MD   BP 120/48 mmHg  Pulse 108  Temp(Src) 97.9 F (36.6 C) (Oral)  Resp 18  Ht  (1.575 m)  Wt 155 lb (70.308 kg)  BMI 28.34 kg/m2  SpO2 98%  LMP 11/12/2014 (Exact Date)  Breastfeeding? No   Physical Exam  General: Well-developed, well-nourished female in no acute distress; appearance consistent with age of record HENT: normocephalic; atraumatic; right TM normal, left TM erythematous; pharynx normal; nasal congestion Eyes: pupils equal, round and reactive to light; extraocular muscles intact Neck: supple Heart: regular rate and rhythm; tachycardia Lungs: clear to auscultation bilaterally Abdomen: soft; nondistended; nontender; bowel sounds present Extremities: No deformity; full range of motion; pulses normal Neurologic: Awake, alert and oriented; motor function intact in all extremities and symmetric; no facial droop Skin: Warm  and dry Psychiatric: Tearful    ED Course  Procedures (including critical care time)   MDM    Hanley Seamen, MD 12/13/14 [phone]

## 2015-01-20 ENCOUNTER — Inpatient Hospital Stay (HOSPITAL_COMMUNITY)
Admission: AD | Admit: 2015-01-20 | Discharge: 2015-01-20 | Disposition: A | Discharge status: Home / Self Care | Payer: Medicaid Other | Source: Ambulatory Visit | Attending: Family Medicine | Admitting: Family Medicine

## 2015-01-20 ENCOUNTER — Encounter (HOSPITAL_COMMUNITY): Payer: Self-pay | Admitting: General Practice

## 2015-01-20 DIAGNOSIS — IMO0002 Reserved for concepts with insufficient information to code with codable children: Secondary | ICD-10-CM

## 2015-01-20 DIAGNOSIS — Z87891 Personal history of nicotine dependence: Secondary | ICD-10-CM | POA: Insufficient documentation

## 2015-01-20 DIAGNOSIS — N949 Unspecified condition associated with female genital organs and menstrual cycle: Secondary | ICD-10-CM | POA: Insufficient documentation

## 2015-01-20 DIAGNOSIS — N8110 Cystocele, unspecified: Secondary | ICD-10-CM | POA: Insufficient documentation

## 2015-01-20 DIAGNOSIS — N816 Rectocele: Secondary | ICD-10-CM

## 2015-01-20 DIAGNOSIS — R109 Unspecified abdominal pain: Secondary | ICD-10-CM | POA: Insufficient documentation

## 2015-01-20 LAB — URINALYSIS, ROUTINE W REFLEX MICROSCOPIC
BILIRUBIN URINE: NEGATIVE
GLUCOSE, UA: NEGATIVE mg/dL
Ketones, ur: NEGATIVE mg/dL
LEUKOCYTES UA: NEGATIVE
Nitrite: NEGATIVE
PH: 6 (ref 5.0–8.0)
Protein, ur: NEGATIVE mg/dL
Specific Gravity, Urine: >1.03 — ABNORMAL HIGH (ref 1.005–1.030)
Urobilinogen, UA: 0.2 mg/dL (ref 0.0–1.0)

## 2015-01-20 LAB — CBC WITH DIFFERENTIAL/PLATELET
BASOS ABS: 0 10*3/uL (ref 0.0–0.1)
Basophils Relative: 0 % (ref 0–1)
EOS ABS: 0.2 10*3/uL (ref 0.0–0.7)
EOS PCT: 3 % (ref 0–5)
HEMATOCRIT: 36.5 % (ref 36.0–46.0)
Hemoglobin: 12.5 g/dL (ref 12.0–15.0)
Lymphocytes Relative: 35 % (ref 12–46)
Lymphs Abs: 2.4 10*3/uL (ref 0.7–4.0)
MCH: 30.5 pg (ref 26.0–34.0)
MCHC: 34.2 g/dL (ref 30.0–36.0)
MCV: 89 fL (ref 78.0–100.0)
Monocytes Absolute: 0.4 10*3/uL (ref 0.1–1.0)
Monocytes Relative: 5 % (ref 3–12)
Neutro Abs: 4 10*3/uL (ref 1.7–7.7)
Neutrophils Relative %: 57 % (ref 43–77)
PLATELETS: 316 10*3/uL (ref 150–400)
RBC: 4.1 MIL/uL (ref 3.87–5.11)
RDW: 13.4 % (ref 11.5–15.5)
WBC: 7 10*3/uL (ref 4.0–10.5)

## 2015-01-20 LAB — URINE MICROSCOPIC-ADD ON

## 2015-01-20 LAB — POCT PREGNANCY, URINE: PREG TEST UR: NEGATIVE

## 2015-01-20 MED ORDER — PHENAZOPYRIDINE HCL 200 MG PO TABS: 200.0000 mg | ORAL_TABLET | Freq: Three times a day (TID) | ORAL | Status: DC

## 2015-01-20 NOTE — Progress Notes (Signed)
I assisted Hope, NP with explanation of care plan.  Eda H Royal  Interpreter.

## 2015-01-20 NOTE — MAU Note (Signed)
Pt has dysuria.

## 2015-01-20 NOTE — MAU Note (Signed)
Lower abd pain started Sunday, had nausea & vomiting.  Neg HPT.  Pain continues, still having nausea.  Denies diarrhea.

## 2015-01-20 NOTE — MAU Provider Note (Signed)
CSN: [id number]     Arrival date & time 01/20/15  1022 History   None    Chief Complaint  Patient presents with  . Abdominal Pain  . Nausea     (Consider location/radiation/quality/duration/timing/severity/associated sxs/prior Treatment) Patient is a 34 y.o. female presenting with abdominal pain. The history is provided by the patient. The history is limited by a language barrier. A language interpreter was used.  Abdominal Pain The primary symptoms of the illness include abdominal pain, nausea, vomiting and dysuria. The primary symptoms of the illness do not include fever, diarrhea, vaginal discharge or vaginal bleeding. The current episode started 2 days ago. The onset of the illness was gradual. The problem has not changed since onset. The patient states that she believes she is currently not pregnant. The patient has not had a change in bowel habit. Additional symptoms associated with the illness include chills and back pain. Symptoms associated with the illness do not include constipation.   Cathy Nash is a [id number], she is not pregnant. She presents to the MAU with a feeling like something is falling out of her vagina. She states that when she stands and walks there feels like a large bulge in her vagina. She is having to get up several times a night to void. Her symptoms started in March 2015 after she delivered her baby and have continued to get worsen. She was evaluated here in November for the same and was to follow up with Dr. Marice Potter. The [facility] office was unable to reach the patient on the phone number listed. The patient reports over the past 2 days the symptoms have gotten worse and she has had some nausea.   Past Medical History  Diagnosis Date  . Asthma   . Asthma   . Preterm labor     1st pregnancy d/t pre-e  . Pregnancy induced hypertension     previous pregnancy  . Pyelonephritis     June 2013  . Varicose veins    Past Surgical History  Procedure  Laterality Date  . No past surgeries     Family History  Problem Relation Age of Onset  . Anesthesia problems Neg Hx   . Hypotension Neg Hx   . Malignant hyperthermia Neg Hx   . Pseudochol deficiency Neg Hx   . Other Neg Hx   . Diabetes Mother   . Asthma Father    History  Substance Use Topics  . Smoking status: Former Games developer  . Smokeless tobacco: Never Used     Comment: yrs ago  . Alcohol Use: No     Comment: a little when not pregnant   OB History    Gravida Para Term Preterm AB TAB SAB Ectopic Multiple Living   0 0 0 0 0 3     Review of Systems  Constitutional: Positive for chills. Negative for fever.  HENT: Negative.   Eyes: Negative.   Respiratory: Negative.   Cardiovascular: Negative.   Gastrointestinal: Positive for nausea, vomiting and abdominal pain. Negative for diarrhea and constipation.  Genitourinary: Positive for dysuria. Negative for vaginal bleeding and vaginal discharge.  Musculoskeletal: Positive for back pain.  Skin: Negative.   Neurological: Negative for syncope.  Psychiatric/Behavioral: Negative for confusion. The patient is not nervous/anxious.       Allergies  Iohexol  Home Medications   Prior to Admission medications   Medication Sig Start Date End Date Taking? Authorizing Provider  acetaminophen (TYLENOL) 500 MG tablet Take  1,000 mg by mouth every 8 (eight) hours as needed for moderate pain or headache.    Yes Historical Provider, MD  amoxicillin (AMOXIL) 500 MG capsule Take 2 capsules (1,000 mg total) by mouth 3 (three) times daily. Patient not taking: Reported on 01/20/2015 12/13/14   Carlisle BeersJohn L Molpus, MD  fluconazole (DIFLUCAN) 150 MG tablet Take 1 tablet if needed for vaginal yeast infection. May repeat in 3 days if necessary. Patient not taking: Reported on 01/20/2015 12/13/14   Carlisle BeersJohn L Molpus, MD  HYDROcodone-acetaminophen (NORCO) 5-325 MG per tablet Take 1-2 tablets by mouth every 6 (six) hours as needed (for pain). Patient not  taking: Reported on 01/20/2015 12/13/14   Carlisle BeersJohn L Molpus, MD  ondansetron (ZOFRAN ODT) 8 MG disintegrating tablet Take 1 tablet (8 mg total) by mouth every 8 (eight) hours as needed for nausea or vomiting. 8mg  ODT q4 hours prn nausea Patient not taking: Reported on 01/20/2015 12/13/14   Carlisle BeersJohn L Molpus, MD  phenazopyridine (PYRIDIUM) 200 MG tablet Take 1 tablet (200 mg total) by mouth 3 (three) times daily. 01/20/15   Hope Orlene OchM Neese, NP   BP 104/68 mmHg  Pulse 58  Temp(Src) 98.1 F (36.7 C) (Oral)  Resp 18  Ht 5\' 3"  (1.6 m)  Wt 128 lb 4 oz (58.174 kg)  BMI 22.72 kg/m2  SpO2 100%  LMP 01/02/2015 (Approximate) Physical Exam  Constitutional: She is oriented to person, place, and time. She appears well-developed and well-nourished. No distress.  HENT:  Head: Normocephalic.  Eyes: Conjunctivae and EOM are normal.  Neck: Neck supple.  Cardiovascular: Normal rate.   Pulmonary/Chest: Effort normal.  Abdominal: Soft. There is no tenderness.  Genitourinary:  External genitalia without lesions, no CMT, no adnexal tenderness. Cystocele and rectocele noted on exam.   Musculoskeletal: Normal range of motion.  Neurological: She is alert and oriented to person, place, and time. No cranial nerve deficit.  Skin: Skin is warm and dry.  Psychiatric: She has a normal mood and affect. Her behavior is normal.  Nursing note and vitals reviewed.   ED Course  Procedures (including critical care time) Labs Review Results for orders placed or performed during the hospital encounter of 01/20/15 (from the past 24 hour(s))  Pregnancy, urine POC     Status: None   Collection Time: 01/20/15 11:06 AM  Result Value Ref Range   Preg Test, Ur NEGATIVE NEGATIVE  Urinalysis, Routine w reflex microscopic     Status: Abnormal   Collection Time: 01/20/15 11:14 AM  Result Value Ref Range   Color, Urine YELLOW YELLOW   APPearance CLEAR CLEAR   Specific Gravity, Urine >1.030 (H) 1.005 - 1.030   pH 6.0 5.0 - 8.0   Glucose, UA  NEGATIVE NEGATIVE mg/dL   Hgb urine dipstick SMALL (A) NEGATIVE   Bilirubin Urine NEGATIVE NEGATIVE   Ketones, ur NEGATIVE NEGATIVE mg/dL   Protein, ur NEGATIVE NEGATIVE mg/dL   Urobilinogen, UA 0.2 0.0 - 1.0 mg/dL   Nitrite NEGATIVE NEGATIVE   Leukocytes, UA NEGATIVE NEGATIVE  Urine microscopic-add on     Status: Abnormal   Collection Time: 01/20/15 11:14 AM  Result Value Ref Range   Squamous Epithelial / LPF FEW (A) RARE   RBC / HPF 0-2 <3 RBC/hpf   Bacteria, UA RARE RARE  CBC with Differential/Platelet     Status: None   Collection Time: 01/20/15  2:04 PM  Result Value Ref Range   WBC 7.0 4.0 - 10.5 K/uL   RBC 4.10 3.87 -  5.11 MIL/uL   Hemoglobin 12.5 12.0 - 15.0 g/dL   HCT 69.6 29.5 - 28.4 %   MCV 89.0 78.0 - 100.0 fL   MCH 30.5 26.0 - 34.0 pg   MCHC 34.2 30.0 - 36.0 g/dL   RDW 13.2 44.0 - 10.2 %   Platelets 316 150 - 400 K/uL   Neutrophils Relative % 57 43 - 77 %   Neutro Abs 4.0 1.7 - 7.7 K/uL   Lymphocytes Relative 35 12 - 46 %   Lymphs Abs 2.4 0.7 - 4.0 K/uL   Monocytes Relative 5 3 - 12 %   Monocytes Absolute 0.4 0.1 - 1.0 K/uL   Eosinophils Relative 3 0 - 5 %   Eosinophils Absolute 0.2 0.0 - 0.7 K/uL   Basophils Relative 0 0 - 1 %   Basophils Absolute 0.0 0.0 - 0.1 K/uL     MDM  34 y.o. female with pressure feeling in lower abdomen and feeling of bulge in her vagina. Discussed with the patient in detail using the hospital translator that we have schedule her a follow up appointment with Dr. Marice Potter in the GYN office at [facility] for Feb. 19th at 9:30 am. She voices understanding and agrees with plan. Stable for d/c without fever, n/v and no acute abdomen. She will return here as needed.  Final diagnoses:  Cystocele  Rectocele

## 2015-01-20 NOTE — Discharge Instructions (Signed)
We have scheduled you for an appointment with Dr. Marice Potterove at [facility] in [place]. The Address is [address], [place], [place], [id number]. The Phone number is [phone].   The appointment is for Feb. 19th at 9:30 am.

## 2015-01-30 ENCOUNTER — Encounter: Payer: Self-pay | Admitting: Obstetrics & Gynecology

## 2015-01-30 ENCOUNTER — Ambulatory Visit (INDEPENDENT_AMBULATORY_CARE_PROVIDER_SITE_OTHER): Payer: Self-pay | Admitting: Obstetrics & Gynecology

## 2015-01-30 VITALS — BP 108/69 | HR 68 | Ht 64.5 in | Wt 129.2 lb

## 2015-01-30 DIAGNOSIS — IMO0002 Reserved for concepts with insufficient information to code with codable children: Secondary | ICD-10-CM

## 2015-01-30 DIAGNOSIS — Z23 Encounter for immunization: Secondary | ICD-10-CM

## 2015-01-30 DIAGNOSIS — IMO0001 Reserved for inherently not codable concepts without codable children: Secondary | ICD-10-CM

## 2015-01-30 DIAGNOSIS — N8110 Cystocele, unspecified: Secondary | ICD-10-CM

## 2015-01-30 DIAGNOSIS — N816 Rectocele: Secondary | ICD-10-CM

## 2015-01-30 NOTE — Progress Notes (Signed)
   Subjective:    Patient ID: Cathy Nash, female    DOB: 08/14/1981, 34 y.o.   MRN: [id number]  HPI  This lovely 34 yo MH P3 is here because of vaginal pain and pressure, worse with walking and standing. She also feels "a ball" coming out of her vagina.  She would like a flu vaccine today.  Review of Systems An interpretor was used during this visit.    Objective:   Physical Exam [id number] Abd-benign  3rd degree rectocele and 2nd degree cystocele  A #5 ring pessary relieved her symptoms and did not cause her any discomfort. She was able to remove and replace it.  She was very happy with this.     Assessment & Plan:  Rectocele and cystocele- pessary RTC 1 month for pessary check

## 2015-02-27 ENCOUNTER — Ambulatory Visit: Payer: Self-pay | Admitting: Obstetrics & Gynecology

## 2015-02-27 DIAGNOSIS — N813 Complete uterovaginal prolapse: Secondary | ICD-10-CM

## 2015-02-27 DIAGNOSIS — N812 Incomplete uterovaginal prolapse: Secondary | ICD-10-CM

## 2015-03-03 ENCOUNTER — Inpatient Hospital Stay (HOSPITAL_COMMUNITY)
Admission: AD | Admit: 2015-03-03 | Discharge: 2015-03-03 | Disposition: A | Discharge status: Home / Self Care | Payer: Self-pay | Source: Ambulatory Visit | Attending: Family Medicine | Admitting: Family Medicine

## 2015-03-03 ENCOUNTER — Encounter (HOSPITAL_COMMUNITY): Payer: Self-pay | Admitting: *Deleted

## 2015-03-03 DIAGNOSIS — Z87891 Personal history of nicotine dependence: Secondary | ICD-10-CM | POA: Insufficient documentation

## 2015-03-03 DIAGNOSIS — N816 Rectocele: Secondary | ICD-10-CM | POA: Insufficient documentation

## 2015-03-03 DIAGNOSIS — IMO0002 Reserved for concepts with insufficient information to code with codable children: Secondary | ICD-10-CM

## 2015-03-03 DIAGNOSIS — N8110 Cystocele, unspecified: Secondary | ICD-10-CM

## 2015-03-03 DIAGNOSIS — IMO0001 Reserved for inherently not codable concepts without codable children: Secondary | ICD-10-CM

## 2015-03-03 HISTORY — DX: Presence of urogenital implants: Z96.0

## 2015-03-03 LAB — URINALYSIS, ROUTINE W REFLEX MICROSCOPIC
Bilirubin Urine: NEGATIVE
GLUCOSE, UA: NEGATIVE mg/dL
Ketones, ur: NEGATIVE mg/dL
Leukocytes, UA: NEGATIVE
NITRITE: NEGATIVE
Protein, ur: NEGATIVE mg/dL
SPECIFIC GRAVITY, URINE: 1.015 (ref 1.005–1.030)
Urobilinogen, UA: 0.2 mg/dL (ref 0.0–1.0)
pH: 6 (ref 5.0–8.0)

## 2015-03-03 LAB — URINE MICROSCOPIC-ADD ON: WBC, UA: NONE SEEN WBC/hpf (ref <3)

## 2015-03-03 LAB — POCT PREGNANCY, URINE: Preg Test, Ur: NEGATIVE

## 2015-03-03 MED ORDER — IBUPROFEN 800 MG PO TABS: 800.0000 mg | ORAL_TABLET | Freq: Three times a day (TID) | ORAL | Status: DC

## 2015-03-03 MED ORDER — IBUPROFEN 800 MG PO TABS
800.0000 mg | ORAL_TABLET | Freq: Once | ORAL | Status: AC
  Administered 2015-03-03: 800 mg via ORAL
  Filled 2015-03-03: qty 1

## 2015-03-03 NOTE — MAU Provider Note (Signed)
History     CSN: [id number]  Arrival date and time: 03/03/15 2016   First Provider Initiated Contact with Patient 03/03/15 2130      Chief Complaint  Patient presents with  . Abdominal Pain   HPI  [name] is a 34 y.o. [phone] who presents to MAU today with complaint of pelvic and vaginal pain x 3 days. She was seen in [facility] on 01/30/15 and had a pessary placed for a grade 2 cystocele and grade 3 rectocele. She denies vaginal bleeding. She states a small amount of thin, white discharge. She states subjective fevers.    OB History    Gravida Para Term Preterm AB TAB SAB Ectopic Multiple Living   0 0 0 0 0 3      Past Medical History  Diagnosis Date  . Asthma   . Asthma   . Preterm labor     1st pregnancy d/t pre-e  . Pregnancy induced hypertension     previous pregnancy  . Pyelonephritis     June 2013  . Varicose veins   . Vaginal pessary in situ     Past Surgical History  Procedure Laterality Date  . No past surgeries      Family History  Problem Relation Age of Onset  . Anesthesia problems Neg Hx   . Hypotension Neg Hx   . Malignant hyperthermia Neg Hx   . Pseudochol deficiency Neg Hx   . Other Neg Hx   . Diabetes Mother   . Asthma Father     History  Substance Use Topics  . Smoking status: Former Games developer  . Smokeless tobacco: Never Used     Comment: yrs ago  . Alcohol Use: No     Comment: a little when not pregnant    Allergies:  Allergies  Allergen Reactions  . Iohexol Hives and Shortness Of Breath     Code: SOB, Desc: IMMEDIATELY SOB FOLLOWING IV INJECTION, SPOTTY HIVES, PT GIVEN BENADRYL AND EPI-ARS 08/22/09, Onset Date: [id number]     No prescriptions prior to admission    Review of Systems  Constitutional: Positive for fever. Negative for malaise/fatigue.  Gastrointestinal: Positive for abdominal pain. Negative for nausea, vomiting, diarrhea and constipation.  Genitourinary: Negative for dysuria, urgency and  frequency.       Neg - vaginal bleeding, abnormal discharge   Physical Exam   Blood pressure 103/55, pulse 68, temperature 97.7 F (36.5 C), temperature source Oral, resp. rate 18, height  (1.6 m), weight 132 lb 4 oz (59.988 kg), last menstrual period 02/19/2015, SpO2 100 %, not currently breastfeeding.  Physical Exam  Constitutional: She is oriented to person, place, and time. She appears well-developed and well-nourished. She appears distressed.  HENT:  Head: Normocephalic.  Cardiovascular: Normal rate.   Respiratory: Effort normal.  GI: Soft. She exhibits no distension and no mass. There is no tenderness. There is no rebound and no guarding.  Genitourinary:    Uterus is tender. Uterus is not enlarged. Cervix exhibits no motion tenderness, no discharge and no friability. Right adnexum displays tenderness. Right adnexum displays no mass. Left adnexum displays tenderness. Left adnexum displays no mass. No erythema or bleeding in the vagina. No signs of injury around the vagina. No vaginal discharge found.  Neurological: She is alert and oriented to person, place, and time.  Skin: Skin is warm and dry. No erythema.  Psychiatric: She has a normal mood and affect.   Results for orders  placed or performed during the hospital encounter of 03/03/15 (from the past 24 hour(s))  Urinalysis, Routine w reflex microscopic     Status: Abnormal   Collection Time: 03/03/15  8:35 PM  Result Value Ref Range   Color, Urine YELLOW YELLOW   APPearance CLEAR CLEAR   Specific Gravity, Urine 1.015 1.005 - 1.030   pH 6.0 5.0 - 8.0   Glucose, UA NEGATIVE NEGATIVE mg/dL   Hgb urine dipstick TRACE (A) NEGATIVE   Bilirubin Urine NEGATIVE NEGATIVE   Ketones, ur NEGATIVE NEGATIVE mg/dL   Protein, ur NEGATIVE NEGATIVE mg/dL   Urobilinogen, UA 0.2 0.0 - 1.0 mg/dL   Nitrite NEGATIVE NEGATIVE   Leukocytes, UA NEGATIVE NEGATIVE  Urine microscopic-add on     Status: Abnormal   Collection Time: 03/03/15   8:35 PM  Result Value Ref Range   Squamous Epithelial / LPF RARE RARE   WBC, UA  <3 WBC/hpf    NO FORMED ELEMENTS SEEN ON URINE MICROSCOPIC EXAMINATION   RBC / HPF 0-2 <3 RBC/hpf   Bacteria, UA FEW (A) RARE  Pregnancy, urine POC     Status: None   Collection Time: 03/03/15  9:01 PM  Result Value Ref Range   Preg Test, Ur NEGATIVE NEGATIVE    MAU Course  Procedures None  MDM UPT - negative UA today Pessary removed. Patient will follow-up in [facility].  Patient states appt 03/05/15 with [facility]. In epic patient missed appt on 02/27/15. Will given urgent follow-up appt this week. 800 mg Ibuprofen given in MAU. Patient reports improvement in pain.  Assessment and Plan  A: Garde 2Cystocele Grade 3 Rectocele  P: Discharge home Rx for ibuprofen given to patient Patient given follow-up with [facility] on 03/06/15 at 9:00 am Patient may return to MAU as needed or if her condition were to change or worsen   Marny LowensteinJulie N Raela Bohl, PA-C  03/03/2015, 11:02 PM

## 2015-03-03 NOTE — MAU Note (Signed)
Pt states that she has had stomach pain since Sunday. Pt also states that she has had a fever but has not taken her temperature. Pt had a pessary put in last month for a rectocele/cystocele and she states that this is causing pain and would like it out.

## 2015-03-03 NOTE — MAU Note (Signed)
Urine in lab 

## 2015-03-03 NOTE — MAU Note (Signed)
Pessary removed and patient to take it back to Dr. Marice Potterove in 2 days for appointment. Pessary not placed correctly and patient told this may be the cause of discomfort.

## 2015-03-03 NOTE — Discharge Instructions (Signed)
Reparacin del cistocele (Cystocele Repair) La reparacin del cistocele es un procedimiento quirrgico para extirpar un cistocele que es un bulto, una zona que cae (hernia) de la vejiga y que se extiende hacia la vagina. Este abultamiento o protrusin se produce en la pared anterior de la vagina. INFORME A SU MDICO:   Cualquier alergia que tenga.  Todos los medicamentos que utiliza, incluyendo vitaminas, hierbas, gotas oftlmicas, cremas y medicamentos de venta libre.  Uso de corticoides (por va oral o cremas).  Problemas previos que usted o los miembros de su familia hayan tenido con el uso de anestsicos.  Enfermedades de la sangre.  Cirugas previas.  Padecimientos mdicos.  Posibilidad de embarazo, si correspondiera. RIESGOS Y COMPLICACIONES  Generalmente, ste es un procedimiento seguro. Sin embargo, como en cualquier procedimiento, pueden surgir complicaciones. Las complicaciones posibles son:  Sangrado excesivo.  Infeccin.  Lesin en los rganos circundantes.  Problemas relacionados con la anestesia. Los riesgos podrn variar segn el tipo de anestesia administrada.  Problemas con el catter urinario despus de la ciruga, como una obstruccin.  Reaparicin del cistocele. ANTES DEL PROCEDIMIENTO   Consulte a su mdico si debe cambiar o suspender los medicamentos que toma habitualmente. Es posible que deba dejar de tomar ciertos medicamentos antes de la ciruga.  No coma ni beba nada despus de la medianoche anterior a la ciruga.  Si fuma, no lo haga al menos en las dos semanas previas a la ciruga.  No beba alcohol los 3 das anteriores a la ciruga.  Pdale a alguna persona que la lleve a su casa despus de la hospitalizacin y que la ayude con las actividades durante la recuperacin. PROCEDIMIENTO   Le aplicarn un medicamento que la har dormir durante el procedimiento (anestesia general) o le inyectarn un medicamento para adormecer la zona de la cintura  para abajo (anestesia espinal) o anestesia epidural. Usted dormir o tendr el cuerpo adormecido durante todo el procedimiento.  Le colocarn un tubo delgado y flexible (catter Foley) en la vejiga para drenar la orina durante y despus de la ciruga.  Esta ciruga se realiza a travs de la vagina. La pared anterior de la vagina se abre, y el msculo que se encuentra entre la vejiga y la vagina se vuelve hacia su posicin normal. Esto se refuerza con puntos o un trozo de malla. De este modo se extirpa la hernia y la parte superior de la vagina no caer en la abertura de la misma.  El corte en la pared anterior de la vagina se cierra con puntos que se reabsorben y no necesitan quitarse. DESPUS DEL PROCEDIMIENTO   La llevarn al rea de recuperacin donde controlarn su evolucin de cerca. Le controlarn con frecuencia la respiracin, la presin arterial y el pulso (signos vitales). Cuando se encuentre estable, ser llevada a una habitacin comn del hospital.  Tendr colocado un catter para drenar la vejiga. Este se dejar en el lugar durante 2 a 7 das o hasta que la vejiga funcione adecuadamente por s misma.  Tendr una gasa en la vagina. Esta se retirar en 1 o 2 das despus de la ciruga.  Le darn medicamentos para calmar el dolor segn sea necesario y podrn indicarle medicamentos para destruir los grmenes (antibiticos).  Ser necesario que permanezca en el hospital durante 1 - 2 das. Document Released: 11/28/2005 Document Revised: 09/18/2013 [employer] Patient Information 2015 [employer]. This information is not intended to replace advice given to you by your health care provider. Make sure you   discuss any questions you have with your health care provider. ° °

## 2015-03-05 ENCOUNTER — Ambulatory Visit (INDEPENDENT_AMBULATORY_CARE_PROVIDER_SITE_OTHER): Payer: Self-pay | Admitting: Obstetrics & Gynecology

## 2015-03-05 ENCOUNTER — Encounter: Payer: Self-pay | Admitting: Obstetrics & Gynecology

## 2015-03-05 VITALS — BP 110/63 | HR 74 | Temp 97.4°F | Ht 62.0 in | Wt 131.9 lb

## 2015-03-05 DIAGNOSIS — IMO0001 Reserved for inherently not codable concepts without codable children: Secondary | ICD-10-CM

## 2015-03-05 DIAGNOSIS — N816 Rectocele: Secondary | ICD-10-CM

## 2015-03-05 MED ORDER — DOXYCYCLINE HYCLATE 100 MG PO CAPS: 100.0000 mg | ORAL_CAPSULE | Freq: Two times a day (BID) | ORAL | Status: DC

## 2015-03-05 NOTE — Patient Instructions (Signed)
About Pelvic Support Problems  Pelvic Support Problems Explained Ligaments, muscles, and connective tissue normally hold your bladder, uterus, and other organs in their proper places in your pelvis. When these tissues become weak, a problem with pelvic support may result. Weak support can cause one or more of the pelvic organs to drop down into the vagina. An organ may even drop so far that is partially exposed outside the body.  Pelvic support problems are named by the change in the organ. The main types of pelvic support problems are:  . Cystocele: When the bladder drops down into your vagina.  . Enterocele: When your small intestine drops between your vagina and rectum.  . Rectocele: When your rectum bulges into the vaginal wall.  . Uterine prolapse: When your uterus drops into your vagina.  . Vaginal prolapse: When the top part of the vagina begins to droop. This sometimes happens after a hysterectomy (removal of the uterus).   Causes Pelvic support problems can be caused by many conditions. They may begin after you give birth, especially if you had a large baby. During childbirth, the muscles and skin of the birth canal (vagina) are stretched and sometimes torn. They heal over time but are not always exactly the same. A long pushing stage of labor may also weaken these tissues as well as very rapid births as the tissues do not have time to stretch so they tear.  Also, after menopause, there are changes in the vaginal walls resulting from a decrease in estrogen. Estrogen helps to keep the tissues toned. Low levels of estrogen weaken the vaginal walls and may cause the bladder to shift from its normal position. As women get older, the loss of muscle tone and the relaxation of muscles may cause the uterus or other organs to drop.  Over time, conditions like chronic coughing, chronic constipation, doing a lot of heavy lifting, straining to pass stool, and obesity, can also weaken the pelvic support  muscles.   Diagnosing Pelvic Support Problems Your health care provider will ask about your symptoms and do a pelvic examination. Your provider may also do a rectal exam during your pelvic exam. Your provider may ask you to: 1. Bear down and push (like you are having a bowel movement) so he or she can see if your bladder or other part of your body protrudes into the vagina. 2. Contract the muscles of your pelvis to check the strength of your pelvic muscles.  3. Do several types of urine, nerve and muscle tests of the pelvis and around the bladder to see what type of treatment is best for you.   Symptoms Symptoms of pelvic support problems depend on the organ involved, but may include:  . urine leakage  . stain or fecal loss after a bowel movement . trouble having bowel movements  . ache in the lower abdomen, groin, or lower back  . bladder infection  . a feeling of heaviness, pulling, or fullness in the pelvis, or a feeling that something is falling out of the vagina  . an organ protruding from your vaginal opening  . feeling the need to support the organs or perineal area to empty bladder or bowels . painful sexual intercourse.  Many women feel pelvic pressure or trouble holding their urine immediately after childbirth. For some, these symptoms go away permanently, in others they return as they get older.  Treatment Options A prolapsed organ cannot repair itself. Contact your health care provider as soon as   you notice symptoms of a problem. Treatment depends on what the specific problem is and how far advanced it is.  . The symptoms caused by some pelvic support problems may simply be treated with changes in diet, medicine to soften the stool, weight loss, or avoiding strenuous activities. You may also do pelvic floor exercises to help strengthen your pelvic muscles.  . Some cases of prolapse may require a special support device made from plastic or rubber called a pessary that fits into the  vagina to support the uterus, vagina, or bladder. A pessary can also help women who leak urine when coughing, straining, or exercising. In mild cases, a tampon or vaginal diaphragm may be used instead of a pessary.  Talk to your doctor or health care provider about these options. . In serious cases, surgery may be needed to put the organs back into their proper place. The uterus may be removed because of the pressure it puts on the bladder.  Your doctor will know what surgery will be best for you. How can I prevent pelvic support problems?  You can help prevent pelvic support problems by:  . maintaining a healthy lifestyle  . continuing to do pelvic floor exercises after you deliver a baby  . maintaining a healthy weight  . avoiding a lot of heavy lifting and lifting with your legs (not from your waist)  . treating constipation and avoid getting constipated by eating high fiber foods.    2007, [employer] Doc.32  

## 2015-03-05 NOTE — Progress Notes (Signed)
Subjective:     Patient ID: Cathy Nash, female   DOB: 04/14/1981, 34 y.o.   MRN: [id number]  [id number] Patient's last menstrual period was 02/19/2015. S/p pessary last month for cystocele and rectocele. Wants to conceive in the future. Pain and nausea for 1-2 weeks, had pessary removed   Review of Systems  Constitutional: Negative.   Gastrointestinal: Positive for nausea and abdominal pain.  Genitourinary: Positive for pelvic pain. Negative for vaginal bleeding, vaginal discharge and menstrual problem.       Objective:   Physical Exam  Constitutional: She is oriented to person, place, and time. She appears well-developed.  Pulmonary/Chest: Effort normal.  Abdominal: Soft. There is tenderness (mild). There is no rebound and no guarding.  Genitourinary:  Ant and post relaxation and mild descensus, mild tenderness no masses  Neurological: She is alert and oriented to person, place, and time.  Skin: Skin is warm and dry.  Psychiatric: She has a normal mood and affect.       Assessment:     Pelvic pain after pessary, cystocele and rectocele     Plan:     Doxycycline 100 mg bid 7 days, rtc to reassess, lose weight at least 10 lb      Adam PhenixJames G Vondell Babers, MD 03/05/2015

## 2015-03-05 NOTE — Progress Notes (Signed)
Alviris used for interpreter 

## 2015-03-19 ENCOUNTER — Emergency Department (HOSPITAL_COMMUNITY): Payer: Self-pay

## 2015-03-19 ENCOUNTER — Encounter (HOSPITAL_COMMUNITY): Payer: Self-pay | Admitting: Physical Medicine and Rehabilitation

## 2015-03-19 ENCOUNTER — Emergency Department (HOSPITAL_COMMUNITY)
Admission: EM | Admit: 2015-03-19 | Discharge: 2015-03-19 | Disposition: A | Discharge status: Home / Self Care | Payer: Self-pay | Attending: Emergency Medicine | Admitting: Emergency Medicine

## 2015-03-19 DIAGNOSIS — J45909 Unspecified asthma, uncomplicated: Secondary | ICD-10-CM | POA: Insufficient documentation

## 2015-03-19 DIAGNOSIS — O26899 Other specified pregnancy related conditions, unspecified trimester: Secondary | ICD-10-CM

## 2015-03-19 DIAGNOSIS — Z87891 Personal history of nicotine dependence: Secondary | ICD-10-CM | POA: Insufficient documentation

## 2015-03-19 DIAGNOSIS — R102 Pelvic and perineal pain: Secondary | ICD-10-CM | POA: Insufficient documentation

## 2015-03-19 DIAGNOSIS — Z792 Long term (current) use of antibiotics: Secondary | ICD-10-CM | POA: Insufficient documentation

## 2015-03-19 DIAGNOSIS — Z791 Long term (current) use of non-steroidal anti-inflammatories (NSAID): Secondary | ICD-10-CM | POA: Insufficient documentation

## 2015-03-19 DIAGNOSIS — O99511 Diseases of the respiratory system complicating pregnancy, first trimester: Secondary | ICD-10-CM | POA: Insufficient documentation

## 2015-03-19 DIAGNOSIS — O9989 Other specified diseases and conditions complicating pregnancy, childbirth and the puerperium: Secondary | ICD-10-CM | POA: Insufficient documentation

## 2015-03-19 DIAGNOSIS — Z3A01 Less than 8 weeks gestation of pregnancy: Secondary | ICD-10-CM | POA: Insufficient documentation

## 2015-03-19 DIAGNOSIS — Z9889 Other specified postprocedural states: Secondary | ICD-10-CM | POA: Insufficient documentation

## 2015-03-19 DIAGNOSIS — Z8679 Personal history of other diseases of the circulatory system: Secondary | ICD-10-CM | POA: Insufficient documentation

## 2015-03-19 DIAGNOSIS — Z8742 Personal history of other diseases of the female genital tract: Secondary | ICD-10-CM | POA: Insufficient documentation

## 2015-03-19 LAB — COMPREHENSIVE METABOLIC PANEL
ALT: 24 U/L (ref 0–35)
AST: 21 U/L (ref 0–37)
Albumin: 3.7 g/dL (ref 3.5–5.2)
Alkaline Phosphatase: 138 U/L — ABNORMAL HIGH (ref 39–117)
Anion gap: 5 (ref 5–15)
BILIRUBIN TOTAL: 0.7 mg/dL (ref 0.3–1.2)
BUN: 7 mg/dL (ref 6–23)
CHLORIDE: 102 mmol/L (ref 96–112)
CO2: 29 mmol/L (ref 19–32)
Calcium: 9 mg/dL (ref 8.4–10.5)
Creatinine, Ser: 0.67 mg/dL (ref 0.50–1.10)
GFR calc Af Amer: >90 mL/min (ref >90)
Glucose, Bld: 95 mg/dL (ref 70–99)
Potassium: 3.6 mmol/L (ref 3.5–5.1)
SODIUM: 136 mmol/L (ref 135–145)
Total Protein: 6.7 g/dL (ref 6.0–8.3)

## 2015-03-19 LAB — CBC WITH DIFFERENTIAL/PLATELET
BASOS ABS: 0 10*3/uL (ref 0.0–0.1)
BASOS PCT: 0 % (ref 0–1)
EOS PCT: 2 % (ref 0–5)
Eosinophils Absolute: 0.1 10*3/uL (ref 0.0–0.7)
HEMATOCRIT: 38.8 % (ref 36.0–46.0)
Hemoglobin: 13.1 g/dL (ref 12.0–15.0)
Lymphocytes Relative: 31 % (ref 12–46)
Lymphs Abs: 2.1 10*3/uL (ref 0.7–4.0)
MCH: 29.8 pg (ref 26.0–34.0)
MCHC: 33.8 g/dL (ref 30.0–36.0)
MCV: 88.2 fL (ref 78.0–100.0)
Monocytes Absolute: 0.4 10*3/uL (ref 0.1–1.0)
Monocytes Relative: 5 % (ref 3–12)
NEUTROS ABS: 4.2 10*3/uL (ref 1.7–7.7)
Neutrophils Relative %: 62 % (ref 43–77)
Platelets: 297 10*3/uL (ref 150–400)
RBC: 4.4 MIL/uL (ref 3.87–5.11)
RDW: 13.9 % (ref 11.5–15.5)
WBC: 6.8 10*3/uL (ref 4.0–10.5)

## 2015-03-19 LAB — URINALYSIS, ROUTINE W REFLEX MICROSCOPIC
Bilirubin Urine: NEGATIVE
GLUCOSE, UA: NEGATIVE mg/dL
Ketones, ur: NEGATIVE mg/dL
NITRITE: NEGATIVE
Protein, ur: NEGATIVE mg/dL
Specific Gravity, Urine: 1.017 (ref 1.005–1.030)
UROBILINOGEN UA: 1 mg/dL (ref 0.0–1.0)
pH: 6.5 (ref 5.0–8.0)

## 2015-03-19 LAB — URINE MICROSCOPIC-ADD ON

## 2015-03-19 LAB — HCG, QUANTITATIVE, PREGNANCY: hCG, Beta Chain, Quant, S: 105 m[IU]/mL — ABNORMAL HIGH (ref <5)

## 2015-03-19 LAB — POC URINE PREG, ED: Preg Test, Ur: POSITIVE — AB

## 2015-03-19 LAB — WET PREP, GENITAL
CLUE CELLS WET PREP: NONE SEEN
TRICH WET PREP: NONE SEEN
Yeast Wet Prep HPF POC: NONE SEEN

## 2015-03-19 LAB — LIPASE, BLOOD: LIPASE: 36 U/L (ref 11–59)

## 2015-03-19 MED ORDER — ACETAMINOPHEN 325 MG PO TABS
325.0000 mg | ORAL_TABLET | Freq: Once | ORAL | Status: AC
  Administered 2015-03-19: 325 mg via ORAL
  Filled 2015-03-19: qty 1

## 2015-03-19 MED ORDER — ACETAMINOPHEN 500 MG PO TABS: 500.0000 mg | ORAL_TABLET | Freq: Four times a day (QID) | ORAL | Status: DC | PRN

## 2015-03-19 NOTE — ED Provider Notes (Signed)
CSN: [id number]     Arrival date & time 03/19/15  1037 History   First MD Initiated Contact with Patient 03/19/15 1338     Chief Complaint  Patient presents with  . Pelvic Pain  . Back Pain     (Consider location/radiation/quality/duration/timing/severity/associated sxs/prior Treatment) HPI  Cathy Nash Is a 34 year old Hispanic female with a past medical history of chronic pelvic pain, rectocele, cystocele who presents emergency Department with chief complaint of suprapubic pain and positive home pregnancy test. She is a Microbiologist. With a positive urine pregnancy test today. The patient denies any vaginal bleeding, fluid in the vaginal vault. She complains of pain with urination and pain medicines, worse with walking. She denies known fevers, although she states she feels "hot. Patient denies nausea, vomiting. Patient was given pessary to bear from the women's outpatient clinic. However, she states that, that helps keep her rectocele and cystocele in her vaginal vault. It was very uncomfortable she could not tolerate it. Past Medical History  Diagnosis Date  . Asthma   . Asthma   . Preterm labor     1st pregnancy d/t pre-e  . Pregnancy induced hypertension     previous pregnancy  . Pyelonephritis     June 2013  . Varicose veins   . Vaginal pessary in situ    Past Surgical History  Procedure Laterality Date  . No past surgeries     Family History  Problem Relation Age of Onset  . Anesthesia problems Neg Hx   . Hypotension Neg Hx   . Malignant hyperthermia Neg Hx   . Pseudochol deficiency Neg Hx   . Other Neg Hx   . Diabetes Mother   . Asthma Father    History  Substance Use Topics  . Smoking status: Former Games developer  . Smokeless tobacco: Never Used     Comment: yrs ago  . Alcohol Use: No   OB History    Gravida Para Term Preterm AB TAB SAB Ectopic Multiple Living   0 0 0 0 0 3     Review of Systems  Ten systems reviewed and are negative for acute  change, except as noted in the HPI.    Allergies  Iohexol  Home Medications   Prior to Admission medications   Medication Sig Start Date End Date Taking? Authorizing Provider  acetaminophen (TYLENOL) 500 MG tablet Take 500 mg by mouth every 6 (six) hours as needed.   Yes Historical Provider, MD  doxycycline (VIBRAMYCIN) 100 MG capsule Take 1 capsule (100 mg total) by mouth 2 (two) times daily. Patient not taking: Reported on 03/19/2015 03/05/15   Adam Phenix, MD  ibuprofen (ADVIL,MOTRIN) 800 MG tablet Take 1 tablet (800 mg total) by mouth 3 (three) times daily. Patient not taking: Reported on 03/19/2015 03/03/15   Marny Lowenstein, PA-C   BP 106/64 mmHg  Pulse 70  Temp(Src) 98 F (36.7 C) (Oral)  Resp 18  SpO2 100%  LMP 02/19/2015 Physical Exam  Constitutional: She is oriented to person, place, and time. She appears well-developed and well-nourished. No distress.  HENT:  Head: Normocephalic and atraumatic.  Eyes: Conjunctivae are normal. No scleral icterus.  Neck: Normal range of motion.  Cardiovascular: Normal rate, regular rhythm and normal heart sounds.  Exam reveals no gallop and no friction rub.   No murmur heard. Pulmonary/Chest: Effort normal and breath sounds normal. No respiratory distress.  Abdominal: Soft. Bowel sounds are normal. She exhibits no distension and  no mass. There is tenderness. There is no guarding.  + suprapubic tenderness   Genitourinary:  Pelvic exam: VULVA: normal appearing vulva with no masses, tenderness or lesions, VAGINA: Rectocele and cystocele present, no abnormal discharge, CERVIX: normal appearing cervix without discharge or lesions, UTERUS: uterus is normal size, shape, consistency and nontender, ADNEXA: normal adnexa in size, nontender and no masses.   Neurological: She is alert and oriented to person, place, and time.  Skin: Skin is warm and dry. She is not diaphoretic.    ED Course  Procedures (including critical care time) Labs  Review Labs Reviewed  COMPREHENSIVE METABOLIC PANEL - Abnormal; Notable for the following:    Alkaline Phosphatase 138 (*)    All other components within normal limits  URINALYSIS, ROUTINE W REFLEX MICROSCOPIC - Abnormal; Notable for the following:    Hgb urine dipstick SMALL (*)    Leukocytes, UA SMALL (*)    All other components within normal limits  URINE MICROSCOPIC-ADD ON - Abnormal; Notable for the following:    Squamous Epithelial / LPF FEW (*)    Bacteria, UA FEW (*)    All other components within normal limits  POC URINE PREG, ED - Abnormal; Notable for the following:    Preg Test, Ur POSITIVE (*)    All other components within normal limits  CBC WITH DIFFERENTIAL/PLATELET  LIPASE, BLOOD    Imaging Review No results found.   EKG Interpretation None      MDM   Final diagnoses:  Pelvic pain affecting pregnancy   Patient with pelvic pain in pregnancy. Awaiting Pelvic US. Pelvic exam is unremarkable. I have given report to PA tran who will assume care of the patient     Arthor Captainbigail Namon Villarin, PA-C 03/27/15 1502  Zadie Rhineonald Wickline, MD 03/27/15 [phone]

## 2015-03-19 NOTE — ED Provider Notes (Signed)
Patient who had a positive pregnancy test 2 days ago at home and confirmed pregnancy test. Presenting with lower abdominal pain. Her last menstrual period was 02/19/2015. Her quantitative hCG is 105 for transfer after ultrasound demonstrated pregnancy of unknown location, with normal pelvic ultrasound. At this time. Differential considerations include intrauterine gestation. 2. Early to be sonographically visualized, spontaneous abortion, or ectopic pregnancy. Patient will need to have a follow-up ultrasound within 10 days and serial quantitative beta hCG follow-up. [facility] and agrees to follow up promptly for further care. She is to endorse lower abdominal cramping. Recommend Tylenol for pain, strict return precautions given.  BP 106/64 mmHg  Pulse 59  Temp(Src) 98 F (36.7 C) (Oral)  Resp 18  SpO2 100%  LMP 02/19/2015  I have reviewed nursing notes and vital signs. I personally reviewed the imaging tests through PACS system  I reviewed available ER/hospitalization records thought the EMR  Results for orders placed or performed during the hospital encounter of 03/19/15  Wet prep, genital  Result Value Ref Range   Yeast Wet Prep HPF POC NONE SEEN NONE SEEN   Trich, Wet Prep NONE SEEN NONE SEEN   Clue Cells Wet Prep HPF POC NONE SEEN NONE SEEN   WBC, Wet Prep HPF POC FEW (A) NONE SEEN  CBC with Differential  Result Value Ref Range   WBC 6.8 4.0 - 10.5 K/uL   RBC 4.40 3.87 - 5.11 MIL/uL   Hemoglobin 13.1 12.0 - 15.0 g/dL   HCT 40.938.8 81.136.0 - 91.446.0 %   MCV 88.2 78.0 - 100.0 fL   MCH 29.8 26.0 - 34.0 pg   MCHC 33.8 30.0 - 36.0 g/dL   RDW 78.213.9 95.611.5 - 21.315.5 %   Platelets 297 150 - 400 K/uL   Neutrophils Relative % 62 43 - 77 %   Neutro Abs 4.2 1.7 - 7.7 K/uL   Lymphocytes Relative 31 12 - 46 %   Lymphs Abs 2.1 0.7 - 4.0 K/uL   Monocytes Relative 5 3 - 12 %   Monocytes Absolute 0.4 0.1 - 1.0 K/uL   Eosinophils Relative 2 0 - 5 %   Eosinophils  Absolute 0.1 0.0 - 0.7 K/uL   Basophils Relative 0 0 - 1 %   Basophils Absolute 0.0 0.0 - 0.1 K/uL  Comprehensive metabolic panel  Result Value Ref Range   Sodium 136 135 - 145 mmol/L   Potassium 3.6 3.5 - 5.1 mmol/L   Chloride 102 96 - 112 mmol/L   CO2 29 19 - 32 mmol/L   Glucose, Bld 95 70 - 99 mg/dL   BUN 7 6 - 23 mg/dL   Creatinine, Ser 0.860.67 0.50 - 1.10 mg/dL   Calcium 9.0 8.4 - 57.810.5 mg/dL   Total Protein 6.7 6.0 - 8.3 g/dL   Albumin 3.7 3.5 - 5.2 g/dL   AST 21 0 - 37 U/L   ALT 24 0 - 35 U/L   Alkaline Phosphatase 138 (H) 39 - 117 U/L   Total Bilirubin 0.7 0.3 - 1.2 mg/dL   GFR calc non Af Amer >90 >90 mL/min   GFR calc Af Amer >90 >90 mL/min   Anion gap 5 5 - 15  Lipase, blood  Result Value Ref Range   Lipase 36 11 - 59 U/L  Urinalysis, Routine w reflex microscopic  Result Value Ref Range   Color, Urine YELLOW YELLOW   APPearance CLEAR CLEAR   Specific Gravity, Urine 1.017 1.005 -  1.030   pH 6.5 5.0 - 8.0   Glucose, UA NEGATIVE NEGATIVE mg/dL   Hgb urine dipstick SMALL (A) NEGATIVE   Bilirubin Urine NEGATIVE NEGATIVE   Ketones, ur NEGATIVE NEGATIVE mg/dL   Protein, ur NEGATIVE NEGATIVE mg/dL   Urobilinogen, UA 1.0 0.0 - 1.0 mg/dL   Nitrite NEGATIVE NEGATIVE   Leukocytes, UA SMALL (A) NEGATIVE  Urine microscopic-add on  Result Value Ref Range   Squamous Epithelial / LPF FEW (A) RARE   WBC, UA 3-6 <3 WBC/hpf   RBC / HPF 3-6 <3 RBC/hpf   Bacteria, UA FEW (A) RARE   Urine-Other RARE YEAST   hCG, quantitative, pregnancy  Result Value Ref Range   hCG, Beta Chain, Quant, S 105 (H) <5 mIU/mL  POC Urine Pregnancy, ED (do NOT order at [facility])  Result Value Ref Range   Preg Test, Ur POSITIVE (A) NEGATIVE   US Ob Comp Less 14 Wks  03/19/2015   CLINICAL DATA:  Pelvic pain in first trimester pregnancy. Gestational age by dates 3 weeks 5 days. Beta HCG 105.  EXAM: OBSTETRIC <14 WK [place] AND TRANSVAGINAL OB US  TECHNIQUE: Both transabdominal and transvaginal ultrasound  examinations were performed for complete evaluation of the gestation as well as the maternal uterus, adnexal regions, and pelvic cul-de-sac. Transvaginal technique was performed to assess early pregnancy.  COMPARISON:  None of this gestation. Gynecologic ultrasound 06/26/2014.  FINDINGS: No intra or extrauterine gestational sac. There is no ovarian enlargement or adnexal mass. The corpus luteum is likely present on the left. No significant or complex pelvic fluid.  IMPRESSION: Pregnancy of unknown location with normal pelvic ultrasound. Differential considerations include intrauterine gestation too early to be sonographically visualized, spontaneous abortion, or ectopic pregnancy. Consider follow-up ultrasound in 10 days and serial quantitative beta HCG follow-up.   Electronically Signed   By: Marnee Spring M.D.   On: 03/19/2015 17:55   US Ob Transvaginal  03/19/2015   CLINICAL DATA:  Pelvic pain in first trimester pregnancy. Gestational age by dates 3 weeks 5 days. Beta HCG 105.  EXAM: OBSTETRIC <14 WK [place] AND TRANSVAGINAL OB US  TECHNIQUE: Both transabdominal and transvaginal ultrasound examinations were performed for complete evaluation of the gestation as well as the maternal uterus, adnexal regions, and pelvic cul-de-sac. Transvaginal technique was performed to assess early pregnancy.  COMPARISON:  None of this gestation. Gynecologic ultrasound 06/26/2014.  FINDINGS: No intra or extrauterine gestational sac. There is no ovarian enlargement or adnexal mass. The corpus luteum is likely present on the left. No significant or complex pelvic fluid.  IMPRESSION: Pregnancy of unknown location with normal pelvic ultrasound. Differential considerations include intrauterine gestation too early to be sonographically visualized, spontaneous abortion, or ectopic pregnancy. Consider follow-up ultrasound in 10 days and serial quantitative beta HCG follow-up.   Electronically Signed   By: Marnee Spring M.D.   On:  03/19/2015 17:55      Fayrene Helper, PA-C 03/19/15 1814  Benjiman Core, MD 03/19/15 [phone]

## 2015-03-19 NOTE — ED Notes (Signed)
Patient transported to Ultrasound 

## 2015-03-19 NOTE — ED Notes (Signed)
Pt reports lower abd pain associated with burning and stinging on urination; reports "fevers" but has not actually measured a temp. Just states "I feel hot"; appears to be in pain at this time.

## 2015-03-19 NOTE — ED Notes (Signed)
Pt presents to department for evaluation of pelvic pain and lower back pain, [facility], pt states "I'm not sure if I am pregnant." 5/10 pain upon arrival to ED.

## 2015-03-19 NOTE — ED Notes (Signed)
Pelvic cart at bedside. 

## 2015-03-19 NOTE — ED Notes (Signed)
Patient is positive on Urine Pregnancy, Nurse Raynelle FanningJulie was informed in triage.

## 2015-03-19 NOTE — Discharge Instructions (Signed)
You are currently pregnant.  It is too early to tell where is the location of the fetus.  Please follow up with [facility] within 10 days to have a repeat ultrasound and check HcG for further evaluation of your pregnancy.  Return to ER if your pain worsen or if you have other concerns.  Dolor plvico  (Pelvic Pain)  Las causa del dolor plvico en la mujer pueden ser muchas y pueden tener su origen en diferentes lugares. El dolor plvico es el que aparece en la mitad inferior del abdomen y [place] caderas. Puede aparecer durante en un perodo corto de tiempo (agudo)o puede ser recurrente (crnico). Esta afeccin puede estar relacionada con trastornos que afectan a los rganos reproductivos femeninos (ginecolgica), pero tambin puede deberse a problemas en la vejiga, clculos renales, complicaciones intestinales, o problemas musculares o esquelticos. Es Garment/textile technologist ayuda de inmediato, sobre todo si ha sido intenso, Verdon, o ha aparecido de Wellsite geologist sbita como un dolor inusual. Tambin es importante obtener ayuda de inmediato, ya que algunos tipos de dolor plvico puede poner en peligro la vida.  CAUSAS  A continuacin veremos algunas de las causas del dolor plvico. Las causas pueden clasificarse de diferentes modos.   Ginecolgica.  Enfermedad inflamatoria plvica.  Infecciones de transmisin sexual.  Quiste de ovario o torsin de un ligamento ovrico ( torsin ovrica).  La membrana que recubre internamente al tero desarrollndose fuera del tero (endometriosis).  Fibromas, quistes o tumores.  Ovulacin.  Embarazo.  Embarazo fuera del tero (embarazo ectpico).  Aborto espontneo.  Trabajo de [place].  Desprendimiento de la placenta o ruptura del tero.  Infecciones.  Infeccin uterina (endometritis).  Infeccin de la vejiga.  Diverticulitis.  Aborto relacionado con una infeccin uterina (aborto sptico).  Vejiga.  Inflamacin de la vejiga  (cistitis).  Clculos renales.  Gastrointenstinal.  Estreimiento.  Diverticulitis.  Neurolgico.  Traumatismos.  Sentir dolor plvico debido a causas mentales o emocionales (psicosomtico).  Tumores en el intestino o en la pelvis. EVALUACIN  El mdico har una historia clnica detallada segn sus sntomas. Incluir los cambios recientes en su salud, una cuidadosa historia ginecolgica de sus periodos (menstruaciones) y Neomia Dear historia de su actividad sexual. Los antecedentes familiares y la historia clnica tambin son importantes. Su mdico podr indicar un examen plvico. El examen plvico ayudar a identificar la ubicacin y la gravedad del Engineer, mining. Tambin ayudar a [employer] rganos que pueden estar involucrados. Myrtha Mantis identificar la causa del dolor plvico y tratarlo adecuadamente, el mdico puede indicar estudios. Estas pruebas pueden ser:   Test de embarazo.  Ecografa plvica.  Radiografa del abdomen.  Un anlisis de [place] o la evaluacin de la secrecin vaginal.  Anlisis de [place]. INSTRUCCIONES PARA EL CUIDADO EN EL HOGAR   Solo tome medicamentos de venta libre o recetados para Chief Technology Officer, Dentist o fiebre, segn las indicaciones del mdico.   Haga reposo segn las indicaciones del mdico.   Consuma una dieta balanceada.   Beba gran cantidad de lquido para mantener la orina de tono claro o amarillo plido.   Evite las relaciones sexuales, Counsellor.   Aplique compresas calientes o fras en la zona baja del abdomen segn cual le calme el dolor.   Evite las situaciones estresantes.   Lleve un registro del dolor plvico. Anote cundo comenz, dnde se Optometrist y si hay cosas que parecen estar asociadas con el dolor, como algn alimento o su ciclo menstrual.  Concurra a las visitas de  control con el mdico, segn las indicaciones.  SOLICITE ATENCIN MDICA SI:   Los medicamentos no Editor, commissioningle calman el dolor.  Tiene flujo vaginal  anormal. SOLICITE ATENCIN MDICA DE INMEDIATO SI:   Tiene un sangrado abundante por la vagina.   El dolor plvico aumenta.   Se siente mareada o sufre un desmayo.   Siente escalofros.   Siente dolor intenso al Geographical information systems officerorinar u observa sangre en la orina.   Tiene diarrea o vmitos que no puede controlar.   Tiene fiebre o sntomas que persisten durante ms de [address]  [address].  Tiene fiebre y los sntomas [address].   Ha sido abusada fsica o sexualmente.  ASEGRESE DE QUE:   Comprende estas instrucciones.  Controlar su enfermedad.  Solicitar ayuda de inmediato si no mejora o si empeora. Document Released: 02/24/2009 Document Revised: 04/14/2014 [facility] Patient Information 2015 [place], [place]. This information is not intended to replace advice given to you by your health care provider. Make sure you discuss any questions you have with your health care provider.

## 2015-03-20 LAB — URINE CULTURE
COLONY COUNT: NO GROWTH
Culture: NO GROWTH

## 2015-03-20 LAB — GC/CHLAMYDIA PROBE AMP (~~LOC~~) NOT AT ARMC
CHLAMYDIA, DNA PROBE: NEGATIVE
Neisseria Gonorrhea: NEGATIVE

## 2015-03-23 ENCOUNTER — Inpatient Hospital Stay (HOSPITAL_COMMUNITY)
Admission: AD | Admit: 2015-03-23 | Discharge: 2015-03-23 | Discharge status: Against Medical Advice | Payer: Self-pay | Source: Ambulatory Visit | Attending: Obstetrics and Gynecology | Admitting: Obstetrics and Gynecology

## 2015-03-23 NOTE — MAU Note (Signed)
Pt stated to registration that she had family emergency and had to leave and left.

## 2015-03-24 ENCOUNTER — Encounter (HOSPITAL_COMMUNITY): Payer: Self-pay | Admitting: *Deleted

## 2015-03-24 ENCOUNTER — Inpatient Hospital Stay (HOSPITAL_COMMUNITY): Payer: Self-pay

## 2015-03-24 ENCOUNTER — Inpatient Hospital Stay (HOSPITAL_COMMUNITY)
Admission: EM | Admit: 2015-03-24 | Discharge: 2015-03-24 | Disposition: A | Discharge status: Home / Self Care | Payer: Self-pay | Source: Ambulatory Visit | Attending: Family Medicine | Admitting: Family Medicine

## 2015-03-24 DIAGNOSIS — O26899 Other specified pregnancy related conditions, unspecified trimester: Secondary | ICD-10-CM

## 2015-03-24 DIAGNOSIS — R109 Unspecified abdominal pain: Secondary | ICD-10-CM

## 2015-03-24 DIAGNOSIS — Z87891 Personal history of nicotine dependence: Secondary | ICD-10-CM | POA: Insufficient documentation

## 2015-03-24 DIAGNOSIS — O9989 Other specified diseases and conditions complicating pregnancy, childbirth and the puerperium: Secondary | ICD-10-CM

## 2015-03-24 DIAGNOSIS — Z3A01 Less than 8 weeks gestation of pregnancy: Secondary | ICD-10-CM

## 2015-03-24 DIAGNOSIS — O3680X0 Pregnancy with inconclusive fetal viability, not applicable or unspecified: Secondary | ICD-10-CM

## 2015-03-24 LAB — CBC
HCT: 37.3 % (ref 36.0–46.0)
HEMOGLOBIN: 13 g/dL (ref 12.0–15.0)
MCH: 30.4 pg (ref 26.0–34.0)
MCHC: 34.9 g/dL (ref 30.0–36.0)
MCV: 87.4 fL (ref 78.0–100.0)
Platelets: 298 10*3/uL (ref 150–400)
RBC: 4.27 MIL/uL (ref 3.87–5.11)
RDW: 14.1 % (ref 11.5–15.5)
WBC: 8.7 10*3/uL (ref 4.0–10.5)

## 2015-03-24 LAB — URINALYSIS, ROUTINE W REFLEX MICROSCOPIC
Bilirubin Urine: NEGATIVE
Glucose, UA: 100 mg/dL — AB
Ketones, ur: NEGATIVE mg/dL
Nitrite: NEGATIVE
PROTEIN: NEGATIVE mg/dL
Specific Gravity, Urine: 1.025 (ref 1.005–1.030)
UROBILINOGEN UA: 0.2 mg/dL (ref 0.0–1.0)
pH: 6.5 (ref 5.0–8.0)

## 2015-03-24 LAB — URINE MICROSCOPIC-ADD ON

## 2015-03-24 LAB — HCG, QUANTITATIVE, PREGNANCY: hCG, Beta Chain, Quant, S: 1094 m[IU]/mL — ABNORMAL HIGH (ref <5)

## 2015-03-24 MED ORDER — ACETAMINOPHEN 500 MG PO TABS: 1000.0000 mg | ORAL_TABLET | Freq: Four times a day (QID) | ORAL | Status: DC | PRN

## 2015-03-24 MED ORDER — ACETAMINOPHEN 500 MG PO TABS
1000.0000 mg | ORAL_TABLET | Freq: Once | ORAL | Status: AC
  Administered 2015-03-24: 1000 mg via ORAL
  Filled 2015-03-24: qty 2

## 2015-03-24 NOTE — MAU Provider Note (Signed)
Chief Complaint: Abdominal Pain and Chills   First Provider Initiated Contact with Patient 03/24/15 1330     SUBJECTIVE HPI: Cathy Nash is a 34 y.o. [id number] at [redacted]w[redacted]d by LMP who presents to Maternity Admissions reporting increased suprapubic pain that has worsened over the past week. Patient reports she was seen at the [facility] ED for her suprapubic pain on 03/19/2015 and was informed at that time she was pregnant. An ultrasound showed pregnancy of unknown location with differentials including IUP, SAB, vs. Ectopic pregnancy. Wet Prep, GC/Chlamydia negative. She reports her pain was at its worse last night while walking and rates it as a 7-8/10. She describes the pain as dull and cramp-like and says it is relieved somewhat with Tylenol. She reports having this similar type of pain with her previous pregnancies.  Patient also reports having increased urinary frequency and burning with urination over the past 3 days. She has not noticed any blood in her urine. She has increased her intake of water but that has not relieved her symptoms.  Past Medical History  Diagnosis Date  . Asthma   . Asthma   . Preterm labor     1st pregnancy d/t pre-e  . Pregnancy induced hypertension     previous pregnancy  . Pyelonephritis     June 2013  . Varicose veins   . Vaginal pessary in situ    OB History  Gravida Para Term Preterm AB SAB TAB Ectopic Multiple Living  0 0 0 0 0 3    # Outcome Date GA Lbr Len/2nd Weight Sex Delivery Anes PTL Lv  4 Current           3 Term 02/21/14 [redacted]w[redacted]d 06:35 / 01:00 5 lb 10.8 oz (2.575 kg) M Vag-Spont Other  Y  2 Term 08/17/12 [redacted]w[redacted]d 05:51 / 00:06 5 lb 8 oz (2.495 kg) M Vag-Spont None  Y  1 Preterm 07/04/06 [redacted]w[redacted]d  5 lb (2.268 kg) M Vag-Spont  N Y     Past Surgical History  Procedure Laterality Date  . No past surgeries     History   Social History  . Marital Status: Single    Spouse Name: N/A  . Number of Children: N/A  . Years of Education: N/A    Occupational History  . Not on file.   Social History Main Topics  . Smoking status: Former Games developer  . Smokeless tobacco: Never Used     Comment: yrs ago  . Alcohol Use: No  . Drug Use: No  . Sexual Activity:    Partners: Male    Birth Control/ Protection: None   Other Topics Concern  . Not on file   Social History Narrative   ** Merged History Encounter **       ** Merged History Encounter **       No current facility-administered medications on file prior to encounter.   Current Outpatient Prescriptions on File Prior to Encounter  Medication Sig Dispense Refill  . doxycycline (VIBRAMYCIN) 100 MG capsule Take 1 capsule (100 mg total) by mouth 2 (two) times daily. (Patient not taking: Reported on 03/19/2015) 14 capsule 0  . ibuprofen (ADVIL,MOTRIN) 800 MG tablet Take 1 tablet (800 mg total) by mouth 3 (three) times daily. (Patient not taking: Reported on 03/19/2015) 30 tablet 0   Allergies  Allergen Reactions  . Iohexol Hives and Shortness Of Breath     Code: SOB, Desc: IMMEDIATELY SOB FOLLOWING IV INJECTION, SPOTTY HIVES,  PT GIVEN BENADRYL AND EPI-ARS 08/22/09, Onset Date: [id number]     Review of Systems  Constitutional: Positive for chills. Negative for fever, malaise/fatigue and diaphoresis.  HENT: Negative for congestion and sore throat.   Respiratory: Negative for cough, hemoptysis, sputum production, shortness of breath, wheezing and stridor.   Cardiovascular: Negative for chest pain, palpitations and leg swelling.  Gastrointestinal: Positive for abdominal pain (suprapubic). Negative for heartburn, nausea, vomiting, diarrhea, constipation and blood in stool.  Genitourinary: Positive for dysuria, urgency and frequency. Negative for hematuria and flank pain.  Musculoskeletal: Positive for back pain.  Neurological: Negative for seizures, loss of consciousness and headaches.  Endo/Heme/Allergies: Negative for polydipsia.    OBJECTIVE Blood pressure 107/64, pulse 70,  temperature 98.1 F (36.7 C), temperature source Oral, resp. rate 18, height 5' 1.5" (1.562 m), weight 131 lb (59.421 kg), last menstrual period 02/19/2015, not currently breastfeeding. GENERAL: Well-developed, well-nourished female in no acute distress.  HEART: normal rate, regular rhythm without murmurs. RESP: normal effort. Lungs clear to auscultation bilaterally without wheezing or rales. GI: Abdomen soft, non-tender. Positive bowel sounds 4. MS: Nontender, no edema NEURO: Alert and oriented PERIPHERAL VASCULAR: Distal pulses 2+; No edema present. PELVIC EXAM: Deferred due to recent exam.  LAB RESULTS Results for orders placed or performed during the hospital encounter of 03/24/15 (from the past 24 hour(s))  Urinalysis, Routine w reflex microscopic     Status: Abnormal   Collection Time: 03/24/15 11:49 AM  Result Value Ref Range   Color, Urine YELLOW YELLOW   APPearance CLEAR CLEAR   Specific Gravity, Urine 1.025 1.005 - 1.030   pH 6.5 5.0 - 8.0   Glucose, UA 100 (A) NEGATIVE mg/dL   Hgb urine dipstick TRACE (A) NEGATIVE   Bilirubin Urine NEGATIVE NEGATIVE   Ketones, ur NEGATIVE NEGATIVE mg/dL   Protein, ur NEGATIVE NEGATIVE mg/dL   Urobilinogen, UA 0.2 0.0 - 1.0 mg/dL   Nitrite NEGATIVE NEGATIVE   Leukocytes, UA SMALL (A) NEGATIVE  Urine microscopic-add on     Status: Abnormal   Collection Time: 03/24/15 11:49 AM  Result Value Ref Range   Squamous Epithelial / LPF FEW (A) RARE   WBC, UA 0-2 <3 WBC/hpf   RBC / HPF 0-2 <3 RBC/hpf  CBC     Status: None   Collection Time: 03/24/15 12:19 PM  Result Value Ref Range   WBC 8.7 4.0 - 10.5 K/uL   RBC 4.27 3.87 - 5.11 MIL/uL   Hemoglobin 13.0 12.0 - 15.0 g/dL   HCT 54.0 98.1 - 19.1 %   MCV 87.4 78.0 - 100.0 fL   MCH 30.4 26.0 - 34.0 pg   MCHC 34.9 30.0 - 36.0 g/dL   RDW 47.8 29.5 - 62.1 %   Platelets 298 150 - 400 K/uL  hCG, quantitative, pregnancy     Status: Abnormal   Collection Time: 03/24/15 12:20 PM  Result Value Ref  Range   hCG, Beta Chain, Quant, S 1094 (H) <5 mIU/mL    IMAGING US Ob Comp Less 14 Wks  03/19/2015   CLINICAL DATA:  Pelvic pain in first trimester pregnancy. Gestational age by dates 3 weeks 5 days. Beta HCG 105.  EXAM: OBSTETRIC <14 WK [place] AND TRANSVAGINAL OB US  TECHNIQUE: Both transabdominal and transvaginal ultrasound examinations were performed for complete evaluation of the gestation as well as the maternal uterus, adnexal regions, and pelvic cul-de-sac. Transvaginal technique was performed to assess early pregnancy.  COMPARISON:  None of this gestation. Gynecologic ultrasound 06/26/2014.  FINDINGS: No intra or extrauterine gestational sac. There is no ovarian enlargement or adnexal mass. The corpus luteum is likely present on the left. No significant or complex pelvic fluid.  IMPRESSION: Pregnancy of unknown location with normal pelvic ultrasound. Differential considerations include intrauterine gestation too early to be sonographically visualized, spontaneous abortion, or ectopic pregnancy. Consider follow-up ultrasound in 10 days and serial quantitative beta HCG follow-up.   Electronically Signed   By: Marnee Spring M.D.   On: 03/19/2015 17:55   US Ob Transvaginal  03/24/2015   CLINICAL DATA:  Midline lower abdominal pain for 3 days. Quantitative beta HCG on 03/24/2015 is 1,094. 03/19/2015, quantitative beta HCG was 105.  EXAM: TRANSVAGINAL OB ULTRASOUND  TECHNIQUE: Transvaginal ultrasound was performed for complete evaluation of the gestation as well as the maternal uterus, adnexal regions, and pelvic cul-de-sac.  COMPARISON:  03/19/2015  FINDINGS: Intrauterine gestational sac: Present  Yolk sac:  Not seen  Embryo:  Not seen  Cardiac Activity: Not seen  MSD: 2.5  mm   4 w   5  d  Maternal uterus/adnexae: Small subchorionic hemorrhage identified. The ovaries have a normal appearance. No free pelvic fluid.  IMPRESSION: 1. Interval development of a probable intrauterine gestational sac. The yolk sac  and embryo are not yet visible. 2. Follow-up ultrasound is recommended in 14 days to document presence of fetal pole and for dating purposes. 3. Small subchorionic hemorrhage.   Electronically Signed   By: Norva Pavlov M.D.   On: 03/24/2015 14:40   US Ob Transvaginal  03/19/2015   CLINICAL DATA:  Pelvic pain in first trimester pregnancy. Gestational age by dates 3 weeks 5 days. Beta HCG 105.  EXAM: OBSTETRIC <14 WK [place] AND TRANSVAGINAL OB US  TECHNIQUE: Both transabdominal and transvaginal ultrasound examinations were performed for complete evaluation of the gestation as well as the maternal uterus, adnexal regions, and pelvic cul-de-sac. Transvaginal technique was performed to assess early pregnancy.  COMPARISON:  None of this gestation. Gynecologic ultrasound 06/26/2014.  FINDINGS: No intra or extrauterine gestational sac. There is no ovarian enlargement or adnexal mass. The corpus luteum is likely present on the left. No significant or complex pelvic fluid.  IMPRESSION: Pregnancy of unknown location with normal pelvic ultrasound. Differential considerations include intrauterine gestation too early to be sonographically visualized, spontaneous abortion, or ectopic pregnancy. Consider follow-up ultrasound in 10 days and serial quantitative beta HCG follow-up.   Electronically Signed   By: Marnee Spring M.D.   On: 03/19/2015 17:55    MAU COURSE Patient given Tylenol for pain. Since the patient is still having abdominal pain and the [place] on 4/7 showed pregnancy of unknown location, we will obtain a transvaginal US.  Urinalysis only showed small leukocytes and negative nitrites. Will obtain urine culture.  Ultrasound shows interval development of probable intrauterine gestational sac with double decidual sign. Yolk sac and embryo are not yet visible. Pain completely resolved with Tylenol.  ASSESSMENT 1. Pregnancy of unknown anatomic location   2. Abdominal pain affecting pregnancy, antepartum     PLAN Discharge home in stable condition. SAB and ectopic precautions. Urine culture pending.     Follow-up Information    Follow up with [facility] In 2 days.   Specialty:  Obstetrics and Gynecology   Why:  For repeat bloodwork   Contact information:   [address] [place] [address] [phone]      Follow up with [facility] [place] [facility].   Specialty:  Radiology   Why:  Will call you to schedule a follow-up ultrasound in approximately 10 days.   Contact information:   [address] [id number] [place] [id number] [phone]      [facility] [place] [facility].   Why:  As needed in emergencies   Contact information:   [address] [id number] [place] [phone]       Medication List    STOP taking these medications        doxycycline 100 MG capsule  Commonly known as:  VIBRAMYCIN     ibuprofen 800 MG tablet  Commonly known as:  ADVIL,MOTRIN      TAKE these medications        acetaminophen 500 MG tablet  Commonly known as:  TYLENOL  Take 2 tablets (1,000 mg total) by mouth every 6 (six) hours as needed for moderate pain.     prenatal multivitamin Tabs tablet  Take 1 tablet by mouth daily at 12 noon.       [place] Remonia [place]te, [place] 03/24/2015  3:27 PM

## 2015-03-24 NOTE — Discharge Instructions (Signed)
Dolor abdominal en el embarazo °(Abdominal Pain During Pregnancy) °El dolor abdominal es frecuente durante el embarazo. Generalmente no causa ningún daño. El dolor abdominal puede tener numerosas causas. Algunas causas son más graves que otras. Ciertas causas de dolor abdominal durante el embarazo se diagnostican fácilmente. A veces, se tarda un tiempo para llegar al diagnóstico. Otras veces la causa no se conoce. El dolor abdominal puede estar relacionado con alguna alteración del embarazo, o puede deberse a una causa totalmente diferente. Por este motivo, siempre consulte a su médico cuando sienta molestias abdominales. °INSTRUCCIONES PARA EL CUIDADO EN EL HOGAR  °Esté atenta al dolor para ver si hay cambios. Las siguientes indicaciones ayudarán a aliviar cualquier molestia que pueda sentir: °· No tenga relaciones sexuales y no coloque nada dentro de la vagina hasta que los síntomas hayan desaparecido completamente. °· Descanse todo lo que pueda hasta que el dolor se le haya calmado. °· Si siente náuseas, beba líquidos claros. Evite los alimentos sólidos mientras sienta malestar o tenga náuseas. °· Tome sólo medicamentos de venta libre o recetados, según las indicaciones del médico. °· Cumpla con todas las visitas de control, según le indique su médico. °SOLICITE ATENCIÓN MÉDICA DE INMEDIATO SI: °· Tiene un sangrado, pérdida de líquidos o elimina tejidos por la vagina. °· El dolor o los cólicos aumentan. °· Tiene vómitos persistentes. °· Comienza a sentir dolor al orinar u observa sangre. °· Tiene fiebre. °· Nota que los movimientos del bebé disminuyen. °· Siente intensa debilidad o se marea. °· Tiene dificultad para respirar con o sin dolor abdominal. °· Siente un dolor de cabeza intenso junto al dolor abdominal. °· Tiene una secreción vaginal anormal con dolor abdominal. °· Tiene diarrea persistente. °· El dolor abdominal sigue o empeora aún después de hacer reposo. °ASEGÚRESE DE QUE:  °· Comprende estas  instrucciones. °· Controlará su afección. °· Recibirá ayuda de inmediato si no mejora o si empeora. °Document Released: 11/28/2005 Document Revised: 09/18/2013 °ExitCare® Patient Information ©2015 [employer]. This information is not intended to replace advice given to you by your health care provider. Make sure you discuss any questions you have with your health care provider. ° °

## 2015-03-24 NOTE — MAU Note (Signed)
Pt. Urine in lab 

## 2015-03-24 NOTE — Progress Notes (Signed)
Dorathy KinsmanVirginia Smith CNM in to discuss test results and d/c plan with pt. WRitten and verbal d/c instructions given and understanding voiced.

## 2015-03-24 NOTE — MAU Note (Addendum)
Cramps in her feet, abd pain and chills.  Chills started last night- took some tylenol, pain went away.  Came back and is worse this morning.  Increased frequency and burning with urination

## 2015-03-25 LAB — CULTURE, OB URINE
Colony Count: NO GROWTH
Culture: NO GROWTH
Special Requests: NORMAL

## 2015-03-26 ENCOUNTER — Other Ambulatory Visit: Payer: Self-pay

## 2015-03-26 DIAGNOSIS — O3680X0 Pregnancy with inconclusive fetal viability, not applicable or unspecified: Secondary | ICD-10-CM

## 2015-03-26 LAB — HCG, QUANTITATIVE, PREGNANCY: hCG, Beta Chain, Quant, S: 3190 m[IU]/mL

## 2015-03-26 NOTE — Progress Notes (Unsigned)
Patient here for stat bhcg for concern of ectopic pregnancy. Patient reports she is no longer having pain in the same place but now it is in her LLQ. Rates her pain less than a 5 and not as bad as the pain she was having before. Reports no bleeding. Per chart review patient needs ultrasound scheduled for 4/22. Scheduled ultrasound for 4/22 @ 945 and informed patient. Patient verbalized understanding to all and knows to go to MAU if pain increases.

## 2015-04-03 ENCOUNTER — Inpatient Hospital Stay (HOSPITAL_COMMUNITY)
Admission: AD | Admit: 2015-04-03 | Discharge: 2015-04-03 | Disposition: A | Discharge status: Home / Self Care | Payer: Self-pay | Source: Ambulatory Visit | Attending: Family Medicine | Admitting: Family Medicine

## 2015-04-03 ENCOUNTER — Ambulatory Visit (HOSPITAL_COMMUNITY)
Admission: RE | Admit: 2015-04-03 | Discharge: 2015-04-03 | Disposition: A | Discharge status: Home / Self Care | Payer: Self-pay | Source: Ambulatory Visit | Attending: Advanced Practice Midwife | Admitting: Advanced Practice Midwife

## 2015-04-03 ENCOUNTER — Ambulatory Visit: Payer: Self-pay | Admitting: Obstetrics & Gynecology

## 2015-04-03 DIAGNOSIS — O9989 Other specified diseases and conditions complicating pregnancy, childbirth and the puerperium: Secondary | ICD-10-CM

## 2015-04-03 DIAGNOSIS — O3680X0 Pregnancy with inconclusive fetal viability, not applicable or unspecified: Secondary | ICD-10-CM

## 2015-04-03 DIAGNOSIS — Z3A01 Less than 8 weeks gestation of pregnancy: Secondary | ICD-10-CM | POA: Insufficient documentation

## 2015-04-03 DIAGNOSIS — R109 Unspecified abdominal pain: Secondary | ICD-10-CM

## 2015-04-03 DIAGNOSIS — Z36 Encounter for antenatal screening of mother: Secondary | ICD-10-CM | POA: Insufficient documentation

## 2015-04-03 DIAGNOSIS — O26899 Other specified pregnancy related conditions, unspecified trimester: Secondary | ICD-10-CM

## 2015-04-03 DIAGNOSIS — O208 Other hemorrhage in early pregnancy: Secondary | ICD-10-CM | POA: Insufficient documentation

## 2015-04-03 NOTE — MAU Provider Note (Signed)
History     CSN: [id number]  Arrival date and time: 04/03/15 1013   None     No chief complaint on file.  HPI This is a 34 y.o. female at 7532w1d who presents for follow up US today. She was seen on 03/24/15 for abdominal pain in early pregnancy but it was too early to see an IUP.  So she was scheduled to come back today for US. Denies pain or bleeding today.    OB History    Gravida Para Term Preterm AB TAB SAB Ectopic Multiple Living   4 3 2 1  0 0 0 0 0 3      Past Medical History  Diagnosis Date  . Asthma   . Asthma   . Preterm labor     1st pregnancy d/t pre-e  . Pregnancy induced hypertension     previous pregnancy  . Pyelonephritis     June 2013  . Varicose veins   . Vaginal pessary in situ     Past Surgical History  Procedure Laterality Date  . No past surgeries      Family History  Problem Relation Age of Onset  . Anesthesia problems Neg Hx   . Hypotension Neg Hx   . Malignant hyperthermia Neg Hx   . Pseudochol deficiency Neg Hx   . Other Neg Hx   . Diabetes Mother   . Asthma Father     History  Substance Use Topics  . Smoking status: Former Games developermoker  . Smokeless tobacco: Never Used     Comment: yrs ago  . Alcohol Use: No    Allergies:  Allergies  Allergen Reactions  . Iohexol Hives and Shortness Of Breath     Code: SOB, Desc: IMMEDIATELY SOB FOLLOWING IV INJECTION, SPOTTY HIVES, PT GIVEN BENADRYL AND EPI-ARS 08/22/09, Onset Date: [id number]     Prescriptions prior to admission  Medication Sig Dispense Refill Last Dose  . acetaminophen (TYLENOL) 500 MG tablet Take 2 tablets (1,000 mg total) by mouth every 6 (six) hours as needed for moderate pain. 30 tablet 0   . Prenatal Vit-Fe Fumarate-FA (PRENATAL MULTIVITAMIN) TABS tablet Take 1 tablet by mouth daily at 12 noon.   03/23/2015 at Unknown time    Review of Systems  Constitutional: Negative for fever, chills and malaise/fatigue.  Gastrointestinal: Negative for nausea, vomiting and abdominal  pain.  Neurological: Negative for dizziness.   Physical Exam   Last menstrual period 02/19/2015, not currently breastfeeding.  Physical Exam  Constitutional: She is oriented to person, place, and time. She appears well-developed and well-nourished. No distress.  HENT:  Head: Normocephalic.  Cardiovascular: Normal rate and regular rhythm.   Respiratory: Effort normal. No respiratory distress.  Musculoskeletal: Normal range of motion.  Neurological: She is alert and oriented to person, place, and time.  Skin: Skin is warm and dry.  Psychiatric: She has a normal mood and affect.    MAU Course  Procedures  MDM US showed SIUP at 2975w6d with FHR 105. Moderate SCH  Assessment and Plan  A:  SIUP at 4132w1d        Moderate subchorionic hemorrhage       No current bleeding  P;  Discussed results with patient and husband       Reviewed she might see some light bleeding       Come back if heavy bleeding or severe pain       Followup with OB doctor for prenatal care  Proof of pregnancy letter given [facility] 04/03/2015, 10:22 AM

## 2015-04-03 NOTE — Discharge Instructions (Signed)
First Trimester of Pregnancy The first trimester of pregnancy is from week 1 until the end of week 12 (months 1 through 3). A week after a sperm fertilizes an egg, the egg will implant on the wall of the uterus. This embryo will begin to develop into a baby. Genes from you and your partner are forming the baby. The female genes determine whether the baby is a boy or a girl. At 6-8 weeks, the eyes and face are formed, and the heartbeat can be seen on ultrasound. At the end of 12 weeks, all the baby's organs are formed.  Now that you are pregnant, you will want to do everything you can to have a healthy baby. Two of the most important things are to get good prenatal care and to follow your health care provider's instructions. Prenatal care is all the medical care you receive before the baby's birth. This care will help prevent, find, and treat any problems during the pregnancy and childbirth. BODY CHANGES Your body goes through many changes during pregnancy. The changes vary from woman to woman.   You may gain or lose a couple of pounds at first.  You may feel sick to your stomach (nauseous) and throw up (vomit). If the vomiting is uncontrollable, call your health care provider.  You may tire easily.  You may develop headaches that can be relieved by medicines approved by your health care provider.  You may urinate more often. Painful urination may mean you have a bladder infection.  You may develop heartburn as a result of your pregnancy.  You may develop constipation because certain hormones are causing the muscles that push waste through your intestines to slow down.  You may develop hemorrhoids or swollen, bulging veins (varicose veins).  Your breasts may begin to grow larger and become tender. Your nipples may stick out more, and the tissue that surrounds them (areola) may become darker.  Your gums may bleed and may be sensitive to brushing and flossing.  Dark spots or blotches (chloasma,  mask of pregnancy) may develop on your face. This will likely fade after the baby is born.  Your menstrual periods will stop.  You may have a loss of appetite.  You may develop cravings for certain kinds of food.  You may have changes in your emotions from day to day, such as being excited to be pregnant or being concerned that something may go wrong with the pregnancy and baby.  You may have more vivid and strange dreams.  You may have changes in your hair. These can include thickening of your hair, rapid growth, and changes in texture. Some women also have hair loss during or after pregnancy, or hair that feels dry or thin. Your hair will most likely return to normal after your baby is born. WHAT TO EXPECT AT YOUR PRENATAL VISITS During a routine prenatal visit:  You will be weighed to make sure you and the baby are growing normally.  Your blood pressure will be taken.  Your abdomen will be measured to track your baby's growth.  The fetal heartbeat will be listened to starting around week 10 or 12 of your pregnancy.  Test results from any previous visits will be discussed. Your health care provider may ask you:  How you are feeling.  If you are feeling the baby move.  If you have had any abnormal symptoms, such as leaking fluid, bleeding, severe headaches, or abdominal cramping.  If you have any questions. Other tests   that may be performed during your first trimester include:  Blood tests to find your blood type and to check for the presence of any previous infections. They will also be used to check for low iron levels (anemia) and Rh antibodies. Later in the pregnancy, blood tests for diabetes will be done along with other tests if problems develop.  Urine tests to check for infections, diabetes, or protein in the urine.  An ultrasound to confirm the proper growth and development of the baby.  An amniocentesis to check for possible genetic problems.  Fetal screens for  spina bifida and Down syndrome.  You may need other tests to make sure you and the baby are doing well. HOME CARE INSTRUCTIONS  Medicines  Follow your health care provider's instructions regarding medicine use. Specific medicines may be either safe or unsafe to take during pregnancy.  Take your prenatal vitamins as directed.  If you develop constipation, try taking a stool softener if your health care provider approves. Diet  Eat regular, well-balanced meals. Choose a variety of foods, such as meat or vegetable-based protein, fish, milk and low-fat dairy products, vegetables, fruits, and whole grain breads and cereals. Your health care provider will help you determine the amount of weight gain that is right for you.  Avoid raw meat and uncooked cheese. These carry germs that can cause birth defects in the baby.  Eating four or five small meals rather than three large meals a day may help relieve nausea and vomiting. If you start to feel nauseous, eating a few soda crackers can be helpful. Drinking liquids between meals instead of during meals also seems to help nausea and vomiting.  If you develop constipation, eat more high-fiber foods, such as fresh vegetables or fruit and whole grains. Drink enough fluids to keep your urine clear or pale yellow. Activity and Exercise  Exercise only as directed by your health care provider. Exercising will help you:  Control your weight.  Stay in shape.  Be prepared for labor and delivery.  Experiencing pain or cramping in the lower abdomen or low back is a good sign that you should stop exercising. Check with your health care provider before continuing normal exercises.  Try to avoid standing for long periods of time. Move your legs often if you must stand in one place for a long time.  Avoid heavy lifting.  Wear low-heeled shoes, and practice good posture.  You may continue to have sex unless your health care provider directs you  otherwise. Relief of Pain or Discomfort  Wear a good support bra for breast tenderness.   Take warm sitz baths to soothe any pain or discomfort caused by hemorrhoids. Use hemorrhoid cream if your health care provider approves.   Rest with your legs elevated if you have leg cramps or low back pain.  If you develop varicose veins in your legs, wear support hose. Elevate your feet for 15 minutes, 3-4 times a day. Limit salt in your diet. Prenatal Care  Schedule your prenatal visits by the twelfth week of pregnancy. They are usually scheduled monthly at first, then more often in the last 2 months before delivery.  Write down your questions. Take them to your prenatal visits.  Keep all your prenatal visits as directed by your health care provider. Safety  Wear your seat belt at all times when driving.  Make a list of emergency phone numbers, including numbers for family, friends, the hospital, and police and fire departments. General Tips    Ask your health care provider for a referral to a local prenatal education class. Begin classes no later than at the beginning of month 6 of your pregnancy.  Ask for help if you have counseling or nutritional needs during pregnancy. Your health care provider can offer advice or refer you to specialists for help with various needs.  Do not use hot tubs, steam rooms, or saunas.  Do not douche or use tampons or scented sanitary pads.  Do not cross your legs for long periods of time.  Avoid cat litter boxes and soil used by cats. These carry germs that can cause birth defects in the baby and possibly loss of the fetus by miscarriage or stillbirth.  Avoid all smoking, herbs, alcohol, and medicines not prescribed by your health care provider. Chemicals in these affect the formation and growth of the baby.  Schedule a dentist appointment. At home, brush your teeth with a soft toothbrush and be gentle when you floss. SEEK MEDICAL CARE IF:   You have  dizziness.  You have mild pelvic cramps, pelvic pressure, or nagging pain in the abdominal area.  You have persistent nausea, vomiting, or diarrhea.  You have a bad smelling vaginal discharge.  You have pain with urination.  You notice increased swelling in your face, hands, legs, or ankles. SEEK IMMEDIATE MEDICAL CARE IF:   You have a fever.  You are leaking fluid from your vagina.  You have spotting or bleeding from your vagina.  You have severe abdominal cramping or pain.  You have rapid weight gain or loss.  You vomit blood or material that looks like coffee grounds.  You are exposed to German measles and have never had them.  You are exposed to fifth disease or chickenpox.  You develop a severe headache.  You have shortness of breath.  You have any kind of trauma, such as from a fall or a car accident. Document Released: 11/22/2001 Document Revised: 04/14/2014 Document Reviewed: 10/08/2013 Exit[employer] Patient Information 2015 [employer]. This information is not intended to replace advice given to you by your health care provider. Make sure you discuss any questions you have with your health care provider.  

## 2015-04-07 ENCOUNTER — Other Ambulatory Visit: Payer: Self-pay

## 2015-04-14 ENCOUNTER — Ambulatory Visit (INDEPENDENT_AMBULATORY_CARE_PROVIDER_SITE_OTHER): Payer: Self-pay | Admitting: Family Medicine

## 2015-04-14 ENCOUNTER — Other Ambulatory Visit: Payer: Self-pay

## 2015-04-14 ENCOUNTER — Other Ambulatory Visit (HOSPITAL_COMMUNITY): Admission: RE | Admit: 2015-04-14 | Payer: Self-pay | Source: Ambulatory Visit | Admitting: Family Medicine

## 2015-04-14 ENCOUNTER — Encounter: Payer: Self-pay | Admitting: Family Medicine

## 2015-04-14 VITALS — BP 87/46 | HR 70 | Temp 97.7°F | Wt 127.0 lb

## 2015-04-14 DIAGNOSIS — O418X10 Other specified disorders of amniotic fluid and membranes, first trimester, not applicable or unspecified: Secondary | ICD-10-CM

## 2015-04-14 DIAGNOSIS — O468X1 Other antepartum hemorrhage, first trimester: Secondary | ICD-10-CM

## 2015-04-14 DIAGNOSIS — Z3491 Encounter for supervision of normal pregnancy, unspecified, first trimester: Secondary | ICD-10-CM

## 2015-04-14 DIAGNOSIS — Z331 Pregnant state, incidental: Secondary | ICD-10-CM

## 2015-04-14 NOTE — Patient Instructions (Signed)
Primer trimestre de embarazo (First Trimester of Pregnancy) El primer trimestre de embarazo se extiende desde la semana1 hasta el final de la semana12 (mes1 al mes3). Una semana despus de que un espermatozoide fecunda un vulo, este se implantar en la pared uterina. Este embrin comenzar a desarrollarse hasta convertirse en un beb. Sus genes y los de su pareja forman el beb. Los genes del varn determinan si ser un nio o una nia. Entre la semana6 y la8, se forman los ojos y el rostro, y los latidos del corazn pueden verse en la ecografa. Al final de las 12semanas, todos los rganos del beb estn formados.  Ahora que est embarazada, querr hacer todo lo que est a su alcance para tener un beb sano. Dos de las cosas ms importantes son tener una buena atencin prenatal y seguir las indicaciones del mdico. La atencin prenatal incluye toda la asistencia mdica que usted recibe antes del nacimiento del beb. Esta ayudar a prevenir, detectar y tratar cualquier problema durante el embarazo y el parto. CAMBIOS EN EL ORGANISMO Su organismo atraviesa por muchos cambios durante el embarazo, y estos varan de una mujer a otra.   Al principio, puede aumentar o bajar algunos kilos.  Puede tener malestar estomacal (nuseas) y vomitar. Si no puede controlar los vmitos, llame al mdico.  Puede cansarse con facilidad.  Es posible que tenga dolores de cabeza que pueden aliviarse con los medicamentos que el mdico le permita tomar.  Puede orinar con mayor frecuencia. El dolor al orinar puede significar que usted tiene una infeccin de la vejiga.  Debido al embarazo, puede tener acidez estomacal.  Puede estar estreida, ya que ciertas hormonas enlentecen los movimientos de los msculos que empujan los desechos a travs de los intestinos.  Pueden aparecer hemorroides o abultarse e hincharse las venas (venas varicosas).  Las mamas pueden empezar a agrandarse y estar sensibles. Los pezones  pueden sobresalir ms, y el tejido que los rodea (areola) tornarse ms oscuro.  Las encas pueden sangrar y estar sensibles al cepillado y al hilo dental.  Pueden aparecer zonas oscuras o manchas (cloasma, mscara del embarazo) en el rostro que probablemente se atenuarn despus del nacimiento del beb.  Los perodos menstruales se interrumpirn.  Tal vez no tenga apetito.  Puede sentir un fuerte deseo de consumir ciertos alimentos.  Puede tener cambios a nivel emocional da a da, por ejemplo, por momentos puede estar emocionada por el embarazo y por otros preocuparse porque algo pueda salir mal con el embarazo o el beb.  Tendr sueos ms vvidos y extraos.  Tal vez haya cambios en el cabello que pueden incluir su engrosamiento, crecimiento rpido y cambios en la textura. A algunas mujeres tambin se les cae el cabello durante o despus del embarazo, o tienen el cabello seco o fino. Lo ms probable es que el cabello se le normalice despus del nacimiento del beb. QU DEBE ESPERAR EN LAS CONSULTAS PRENATALES Durante una visita prenatal de rutina:  La pesarn para asegurarse de que usted y el beb estn creciendo normalmente.  Le controlarn la presin arterial.  Le medirn el abdomen para controlar el desarrollo del beb.  Se escucharn los latidos cardacos a partir de la semana10 o la12 de embarazo, aproximadamente.  Se analizarn los resultados de los estudios solicitados en visitas anteriores. El mdico puede preguntarle:  Cmo se siente.  Si siente los movimientos del beb.  Si ha tenido sntomas anormales, como prdida de lquido, sangrado, dolores de cabeza intensos o   clicos abdominales.  Si tiene alguna pregunta. Otros estudios que pueden realizarse durante el primer trimestre incluyen lo siguiente:  Anlisis de sangre para determinar el tipo de sangre y detectar la presencia de infecciones previas. Adems, se los usar para controlar si los niveles de hierro  son bajos (anemia) y determinar los anticuerpos Rh. En una etapa ms avanzada del embarazo, se harn anlisis de sangre para saber si tiene diabetes, junto con otros estudios si surgen problemas.  Anlisis de orina para detectar infecciones, diabetes o protenas en la orina.  Una ecografa para confirmar que el beb crece y se desarrolla correctamente.  Una amniocentesis para diagnosticar posibles problemas genticos.  Estudios del feto para descartar espina bfida y sndrome de Down.  Es posible que necesite otras pruebas adicionales. INSTRUCCIONES PARA EL CUIDADO EN EL HOGAR  Medicamentos:  Siga las indicaciones del mdico en relacin con el uso de medicamentos. Durante el embarazo, hay medicamentos que pueden tomarse y otros que no.  Tome las vitaminas prenatales como se le indic.  Si est estreida, tome un laxante suave, si el mdico lo autoriza. Dieta  Consuma alimentos balanceados. Elija alimentos variados, como carne o protenas de origen vegetal, pescado, leche y productos lcteos descremados, verduras, frutas y panes y cereales integrales. El mdico la ayudar a determinar la cantidad de peso que puede aumentar.  No coma carne cruda ni quesos sin cocinar. Estos elementos contienen bacterias que pueden causar defectos congnitos en el beb.  La ingesta diaria de cuatro o cinco comidas pequeas en lugar de tres comidas abundantes puede ayudar a aliviar las nuseas y los vmitos. Si empieza a tener nuseas, comer algunas galletas saladas puede ser de ayuda. Beber lquidos entre las comidas en lugar de tomarlos durante las comidas tambin puede ayudar a calmar las nuseas y los vmitos.  Si est estreida, consuma alimentos con alto contenido de fibra, como verduras y frutas frescas, y cereales integrales. Beba suficiente lquido para mantener la orina clara o de color amarillo plido. Actividad y ejercicios  Haga ejercicio solamente como se lo haya indicado el mdico. El  ejercicio la ayudar a:  Controlar el peso.  Mantenerse en forma.  Estar preparada para el trabajo de parto y el parto.  Los dolores, los clicos en la parte baja del abdomen o los calambres en la cintura son un buen indicio de que debe dejar de hacer ejercicios. Consulte al mdico antes de seguir haciendo ejercicios normales.  Intente no estar de pie durante mucho tiempo. Mueva las piernas con frecuencia si debe estar de pie en un lugar durante mucho tiempo.  Evite levantar pesos excesivos.  Use zapatos de tacones bajos y mantenga una buena postura.  Puede seguir teniendo relaciones sexuales, excepto que el mdico le indique lo contrario. Alivio del dolor o las molestias  Use un sostn que le brinde buen soporte si siente dolor a la palpacin en las mamas.  Dese baos de asiento con agua tibia para aliviar el dolor o las molestias causadas por las hemorroides. Use crema antihemorroidal si el mdico se lo permite.  Descanse con las piernas elevadas si tiene calambres o dolor de cintura.  Si tiene venas varicosas en las piernas, use medias de descanso. Eleve los pies durante 15minutos, 3 o 4veces por da. Limite la cantidad de sal en su dieta. Cuidados prenatales  Programe las visitas prenatales para la semana12 de embarazo. Generalmente se programan cada mes al principio y se hacen ms frecuentes en los 2 ltimos meses antes   del parto.  Escriba sus preguntas. Llvelas cuando concurra a las visitas prenatales.  Concurra a todas las visitas prenatales como se lo haya indicado el mdico. Seguridad  Colquese el cinturn de seguridad cuando conduzca.  Haga una lista de los nmeros de telfono de emergencia, que incluya los nmeros de telfono de familiares, amigos, el hospital y los departamentos de polica y bomberos. Consejos generales  Pdale al mdico que la derive a clases de educacin prenatal en su localidad. Debe comenzar a tomar las clases antes de entrar en el mes6  de embarazo.  Pida ayuda si tiene necesidades nutricionales o de asesoramiento durante el embarazo. El mdico puede aconsejarla o derivarla a especialistas para que la ayuden con diferentes necesidades.  No se d baos de inmersin en agua caliente, baos turcos ni saunas.  No se haga duchas vaginales ni use tampones o toallas higinicas perfumadas.  No mantenga las piernas cruzadas durante mucho tiempo.  Evite el contacto con las bandejas sanitarias de los gatos y la tierra que estos animales usan. Estos elementos contienen bacterias que pueden causar defectos congnitos al beb y la posible prdida del feto debido a un aborto espontneo o muerte fetal.  No fume, no consuma hierbas ni medicamentos que no hayan sido recetados por el mdico. Las sustancias qumicas que estos productos contienen afectan la formacin y el desarrollo del beb.  Programe una cita con el dentista. En su casa, lvese los dientes con un cepillo dental blando y psese el hilo dental con suavidad. SOLICITE ATENCIN MDICA SI:   Tiene mareos.  Siente clicos leves, presin en la pelvis o dolor persistente en el abdomen.  Tiene nuseas, vmitos o diarrea persistentes.  Tiene secrecin vaginal con mal olor.  Siente dolor al orinar.  Tiene el rostro, las manos, las piernas o los tobillos ms hinchados. SOLICITE ATENCIN MDICA DE INMEDIATO SI:   Tiene fiebre.  Tiene una prdida de lquido por la vagina.  Tiene sangrado o pequeas prdidas vaginales.  Siente dolor intenso o clicos en el abdomen.  Sube o baja de peso rpidamente.  Vomita sangre de color rojo brillante o material que parezca granos de caf.  Ha estado expuesta a la rubola y no ha sufrido la enfermedad.  Ha estado expuesta a la quinta enfermedad o a la varicela.  Tiene un dolor de cabeza intenso.  Le falta el aire.  Sufre cualquier tipo de traumatismo, por ejemplo, debido a una cada o un accidente automovilstico. Document  Released: 09/07/2005 Document Revised: 04/14/2014 [employer] Patient Information 2015 [employer]. This information is not intended to replace advice given to you by your health care provider. Make sure you discuss any questions you have with your health care provider.   

## 2015-04-14 NOTE — Progress Notes (Signed)
Rush BarerLizzeth Lopez-Jarquin is a 34 y.o. yo [phone] at 9625w5d who presents for her a prenatal visit through [facility], however she has not obtained lab testing therefore will hold off on initial OB visit for 2weeks (discussed with Dr. Mauricio PoBreen) Pregnancy  isplanned Patient endorses diffuse abdominal pain since Friday/Saturday due to constipation (no similar to the pain she experienced in April that took her to the [facility]). She's been having round ligament pain bilaterally after being on her feet for a while or while walking.  She states she's had this same pulling sensation bilaterally in the 1st trimesters with her other 3 pregnancies.  Her feet have been swollen since Monday of this week. No N/V, HAs, or blurred vision.  Sunday she had a small amoutn od vaginal discharge with a spot of blood (bright red).  Of note, the patient has a cystocele and rectocele s/p pessary removal in 02/2015 who states she'd prefer a C-section due to her h/o prolapse.  She  isTaking PNV. See flow sheet for details.  PMH, POBH, FH, meds, allergies and Social Hx reviewed. Of note, the patient had pre-eclampsia with her 1st pregnancy requiring induction and PIH (unknown if pre-E) which required IOL at 37wks.  No tobacco use or alcohol use. Feels safe at home (lives with an elderly lady, partner, and her 3 sons) Strong family h/o DM (mom, brothers, and cousins).   Prenatal exam:Gen: Well nourished, well developed.  No distress.  Vitals noted. HEENT: Normocephalic, atraumatic.  Neck supple without cervical lymphadenopathy, thyromegaly or thyroid nodules.  poor dentition (per pt she needs 2 molars extracted however her dentist would not do it while pregnant). CV: RRR no murmur, gallops or rubs. No LE swelling. 2+ DP pulses bilaterally.  Lungs: CTA B.  Normal respiratory effort without wheezes or crackles. Abd: soft, NTND. +BS.  Uterus not appreciated above pelvis. GU: Normal external female genitalia without lesions.  Anterior>posterior  relaxation with mild descensus and mild tenderness.  Well rugated vaginal wall without lesions. No vaginal discharge.  Bimanual exam: No adnexal masses.   Uterus size difficult to ascertain. Ext: No clubbing or cyanosis. Psych: Normal grooming and dress.  Not depressed or anxious appearing.  Normal thought content and process without flight of ideas or looseness of associations  Assessment/plan: 1) Pregnancy 6525w5d doing well, will need to return for initial OB appt Current pregnancy issues include:  - h/o pre-eclampsia and PIH in 2/3 pregnancies: will need referral to [facility] at next appt   - h/o cystocele and rectocele currently considering c-section- can discuss at [facility] appt  - moderate subchorionic hemorrhage noted on TVUS on 4/22- per conversation with Dr. Adrian BlackwaterStinson, OB/gyn, will repeat OB U/S in 4wks, discussed bleeding with the pt.   - strong family h/o DM, will get 1hr glucola at next visit (initial OB visit)  - Pt interested in BTL in the future, no paperwork signed. Pt to meet with financial assistance regarding pregnancy . Dating is reliable Prenatal labs ordered today, will f/u for initial OB appt Bleeding and pain precautions reviewed. Importance of prenatal vitamins reviewed.  Genetic screening offered.  Early glucola is indicated.    Follow up 2 weeks.

## 2015-04-15 LAB — OBSTETRIC PANEL
Antibody Screen: NEGATIVE
Basophils Absolute: 0 10*3/uL (ref 0.0–0.1)
Basophils Relative: 0 % (ref 0–1)
EOS PCT: 1 % (ref 0–5)
Eosinophils Absolute: 0.1 10*3/uL (ref 0.0–0.7)
HCT: 39 % (ref 36.0–46.0)
Hemoglobin: 13 g/dL (ref 12.0–15.0)
Hepatitis B Surface Ag: NEGATIVE
LYMPHS ABS: 2 10*3/uL (ref 0.7–4.0)
LYMPHS PCT: 26 % (ref 12–46)
MCH: 29.8 pg (ref 26.0–34.0)
MCHC: 33.3 g/dL (ref 30.0–36.0)
MCV: 89.4 fL (ref 78.0–100.0)
MONO ABS: 0.4 10*3/uL (ref 0.1–1.0)
MPV: 11.1 fL (ref 8.6–12.4)
Monocytes Relative: 5 % (ref 3–12)
Neutro Abs: 5.2 10*3/uL (ref 1.7–7.7)
Neutrophils Relative %: 68 % (ref 43–77)
Platelets: 357 10*3/uL (ref 150–400)
RBC: 4.36 MIL/uL (ref 3.87–5.11)
RDW: 14.4 % (ref 11.5–15.5)
RH TYPE: POSITIVE
Rubella: 21.3 Index — ABNORMAL HIGH (ref <0.90)
WBC: 7.6 10*3/uL (ref 4.0–10.5)

## 2015-04-15 LAB — CERVICOVAGINAL ANCILLARY ONLY
Chlamydia: NEGATIVE
Neisseria Gonorrhea: NEGATIVE

## 2015-04-15 LAB — SICKLE CELL SCREEN: Sickle Cell Screen: NEGATIVE

## 2015-04-15 LAB — HIV ANTIBODY (ROUTINE TESTING W REFLEX): HIV 1&2 Ab, 4th Generation: NONREACTIVE

## 2015-04-15 NOTE — Progress Notes (Signed)
I was preceptor for this office visit.  

## 2015-04-16 LAB — CULTURE, OB URINE
Colony Count: NO GROWTH
ORGANISM ID, BACTERIA: NO GROWTH

## 2015-04-24 ENCOUNTER — Inpatient Hospital Stay (HOSPITAL_COMMUNITY)
Admission: AD | Admit: 2015-04-24 | Discharge: 2015-04-25 | Disposition: A | Discharge status: Home / Self Care | Payer: Self-pay | Source: Ambulatory Visit | Attending: Obstetrics & Gynecology | Admitting: Obstetrics & Gynecology

## 2015-04-24 DIAGNOSIS — Z87891 Personal history of nicotine dependence: Secondary | ICD-10-CM | POA: Insufficient documentation

## 2015-04-24 DIAGNOSIS — R1030 Lower abdominal pain, unspecified: Secondary | ICD-10-CM | POA: Insufficient documentation

## 2015-04-24 DIAGNOSIS — N390 Urinary tract infection, site not specified: Secondary | ICD-10-CM

## 2015-04-24 DIAGNOSIS — R51 Headache: Secondary | ICD-10-CM | POA: Insufficient documentation

## 2015-04-24 DIAGNOSIS — Z3A09 9 weeks gestation of pregnancy: Secondary | ICD-10-CM | POA: Insufficient documentation

## 2015-04-24 DIAGNOSIS — O26891 Other specified pregnancy related conditions, first trimester: Secondary | ICD-10-CM

## 2015-04-24 DIAGNOSIS — O2341 Unspecified infection of urinary tract in pregnancy, first trimester: Secondary | ICD-10-CM | POA: Insufficient documentation

## 2015-04-24 NOTE — [facility] Note (Signed)
I have bad headache and abd pain. Hurts to pee. Denies bleeding or d/c. "I don't want to be at home" Denies being afraid or anything.

## 2015-04-25 ENCOUNTER — Encounter (HOSPITAL_COMMUNITY): Payer: Self-pay | Admitting: *Deleted

## 2015-04-25 LAB — URINALYSIS, ROUTINE W REFLEX MICROSCOPIC
BILIRUBIN URINE: NEGATIVE
Glucose, UA: NEGATIVE mg/dL
KETONES UR: NEGATIVE mg/dL
Nitrite: NEGATIVE
PH: 6 (ref 5.0–8.0)
Protein, ur: NEGATIVE mg/dL
Specific Gravity, Urine: 1.015 (ref 1.005–1.030)
UROBILINOGEN UA: 0.2 mg/dL (ref 0.0–1.0)

## 2015-04-25 LAB — URINE MICROSCOPIC-ADD ON

## 2015-04-25 MED ORDER — CEPHALEXIN 500 MG PO CAPS: 500.0000 mg | ORAL_CAPSULE | Freq: Four times a day (QID) | ORAL | Status: DC

## 2015-04-25 MED ORDER — BUTALBITAL-APAP-CAFFEINE 50-325-40 MG PO TABS: 1.0000 | ORAL_TABLET | Freq: Four times a day (QID) | ORAL | Status: DC | PRN

## 2015-04-25 MED ORDER — BUTALBITAL-APAP-CAFFEINE 50-325-40 MG PO TABS
2.0000 | ORAL_TABLET | Freq: Once | ORAL | Status: AC
  Administered 2015-04-25: 2 via ORAL
  Filled 2015-04-25: qty 2

## 2015-04-25 NOTE — Progress Notes (Signed)
Written and verbal d/c instructions given and understnding voiced. Husband here to pick up pt

## 2015-04-25 NOTE — MAU Provider Note (Signed)
History     CSN: [id number]  Arrival date and time: 04/24/15 2348   First Provider Initiated Contact with Patient 04/25/15 0053      Chief Complaint  Patient presents with  . Abdominal Pain  . Headache   HPI   Cathy Nash is a 34 y.o. female [phone] at 2127w0d who presents with a headache. The headache started this evening; she took 1.5 tabs of tylenol at 10:00 PM and this did not relieve the HA. She currently rates her pain 8/10; the pain is constant and is located in the front of her head. +photophobia   She also reports lower abdominal pain that started a couple of days ago; she took tylenol throughout the week and the pain stopped. She has symptoms of a UTI. No fever. The pain is located in the middle of her stomach. The pain comes and goes; worsens with urination.   OB History    Gravida Para Term Preterm AB TAB SAB Ectopic Multiple Living   4 3 2 1  0 0 0 0 0 3      Past Medical History  Diagnosis Date  . Asthma   . Asthma   . Preterm labor     1st pregnancy d/t pre-e  . Pregnancy induced hypertension     previous pregnancy  . Pyelonephritis     June 2013  . Varicose veins   . Vaginal pessary in situ     Past Surgical History  Procedure Laterality Date  . No past surgeries      Family History  Problem Relation Age of Onset  . Anesthesia problems Neg Hx   . Hypotension Neg Hx   . Malignant hyperthermia Neg Hx   . Pseudochol deficiency Neg Hx   . Other Neg Hx   . Diabetes Mother   . Asthma Father     History  Substance Use Topics  . Smoking status: Former Games developermoker  . Smokeless tobacco: Never Used     Comment: yrs ago  . Alcohol Use: No    Allergies:  Allergies  Allergen Reactions  . Iohexol Hives and Shortness Of Breath     Code: SOB, Desc: IMMEDIATELY SOB FOLLOWING IV INJECTION, SPOTTY HIVES, PT GIVEN BENADRYL AND EPI-ARS 08/22/09, Onset Date: [id number]     Prescriptions prior to admission  Medication Sig Dispense Refill Last Dose   . acetaminophen (TYLENOL) 500 MG tablet Take 2 tablets (1,000 mg total) by mouth every 6 (six) hours as needed for moderate pain. 30 tablet 0 04/24/2015 at Unknown time  . Prenatal Vit-Fe Fumarate-FA (PRENATAL MULTIVITAMIN) TABS tablet Take 1 tablet by mouth daily at 12 noon.   04/24/2015 at Unknown time   Results for orders placed or performed during the hospital encounter of 04/24/15 (from the past 48 hour(s))  Urinalysis, Routine w reflex microscopic     Status: Abnormal   Collection Time: 04/25/15 12:35 AM  Result Value Ref Range   Color, Urine YELLOW YELLOW   APPearance CLEAR CLEAR   Specific Gravity, Urine 1.015 1.005 - 1.030   pH 6.0 5.0 - 8.0   Glucose, UA NEGATIVE NEGATIVE mg/dL   Hgb urine dipstick TRACE (A) NEGATIVE   Bilirubin Urine NEGATIVE NEGATIVE   Ketones, ur NEGATIVE NEGATIVE mg/dL   Protein, ur NEGATIVE NEGATIVE mg/dL   Urobilinogen, UA 0.2 0.0 - 1.0 mg/dL   Nitrite NEGATIVE NEGATIVE   Leukocytes, UA SMALL (A) NEGATIVE  Urine microscopic-add on     Status: Abnormal   Collection  Time: 04/25/15 12:35 AM  Result Value Ref Range   Squamous Epithelial / LPF RARE RARE   WBC, UA 3-6 <3 WBC/hpf   RBC / HPF 0-2 <3 RBC/hpf   Bacteria, UA FEW (A) RARE    Review of Systems  Constitutional: Negative for fever and chills.  Genitourinary: Positive for dysuria, urgency and frequency.       Denies vaginal bleeding    Physical Exam   Blood pressure 110/69, pulse 93, temperature 97.3 F (36.3 C), resp. rate 18, height 5\' 2"  (1.575 m), weight 60.691 kg (133 lb 12.8 oz), last menstrual period 02/19/2015, not currently breastfeeding.  Physical Exam  Constitutional: She is oriented to person, place, and time. She appears well-developed and well-nourished. No distress.  HENT:  Head: Normocephalic.  Eyes: Pupils are equal, round, and reactive to light.  Neck: Neck supple.  Respiratory: Effort normal.  GI: Normal appearance. There is tenderness in the suprapubic area. There is  no rigidity, no rebound and no guarding.  Musculoskeletal: Normal range of motion.  Neurological: She is alert and oriented to person, place, and time. GCS eye subscore is 4. GCS verbal subscore is 5. GCS motor subscore is 6.  Skin: Skin is warm. She is not diaphoretic.  Psychiatric: Her behavior is normal.    MAU Course  Procedures  None  MDM Fioricet 2 tabs given.  Patient rates her pain 0/10 at the time of discharge  Urine culture pending   Assessment and Plan   A:  1. Headache in pregnancy, first trimester   2. UTI (lower urinary tract infection)    P:  Discharge home in stable condition RX: Fioricet, keflex Urine culture pending Return to MAU if symptoms worsen Start prenatal care ASAP HD information given  First trimester warning signs reviewed   Duane LopeJennifer I Desiraye Rolfson, NP 04/25/2015 12:58 AM

## 2015-04-26 ENCOUNTER — Encounter (HOSPITAL_COMMUNITY): Payer: Self-pay | Admitting: *Deleted

## 2015-04-26 ENCOUNTER — Inpatient Hospital Stay (HOSPITAL_COMMUNITY)
Admission: AD | Admit: 2015-04-26 | Discharge: 2015-04-26 | Disposition: A | Discharge status: Home / Self Care | Payer: Self-pay | Source: Ambulatory Visit | Attending: Obstetrics & Gynecology | Admitting: Obstetrics & Gynecology

## 2015-04-26 DIAGNOSIS — N8110 Cystocele, unspecified: Secondary | ICD-10-CM | POA: Insufficient documentation

## 2015-04-26 DIAGNOSIS — O3481 Maternal care for other abnormalities of pelvic organs, first trimester: Secondary | ICD-10-CM | POA: Insufficient documentation

## 2015-04-26 DIAGNOSIS — O9989 Other specified diseases and conditions complicating pregnancy, childbirth and the puerperium: Secondary | ICD-10-CM | POA: Insufficient documentation

## 2015-04-26 DIAGNOSIS — Z3A09 9 weeks gestation of pregnancy: Secondary | ICD-10-CM | POA: Insufficient documentation

## 2015-04-26 DIAGNOSIS — R059 Cough, unspecified: Secondary | ICD-10-CM

## 2015-04-26 DIAGNOSIS — R05 Cough: Secondary | ICD-10-CM | POA: Insufficient documentation

## 2015-04-26 DIAGNOSIS — Z87891 Personal history of nicotine dependence: Secondary | ICD-10-CM | POA: Insufficient documentation

## 2015-04-26 LAB — URINE CULTURE: Colony Count: 30000

## 2015-04-26 LAB — WET PREP, GENITAL
CLUE CELLS WET PREP: NONE SEEN
TRICH WET PREP: NONE SEEN
YEAST WET PREP: NONE SEEN

## 2015-04-26 MED ORDER — BENZONATATE 100 MG PO CAPS
200.0000 mg | ORAL_CAPSULE | Freq: Once | ORAL | Status: DC
  Filled 2015-04-26: qty 2

## 2015-04-26 MED ORDER — GUAIFENESIN-CODEINE 100-10 MG/5ML PO SYRP: 5.0000 mL | ORAL_SOLUTION | Freq: Three times a day (TID) | ORAL | Status: DC | PRN

## 2015-04-26 MED ORDER — BENZONATATE 100 MG PO CAPS
200.0000 mg | ORAL_CAPSULE | Freq: Once | ORAL | Status: AC
  Administered 2015-04-26: 200 mg via ORAL

## 2015-04-26 NOTE — MAU Note (Signed)
PT  SAYS SHE WAS HERE  ON Friday - DX- UTI-  SHE GOT  RX  FILLED   .   NOW   HAS COUGH -  STARTED  THIS  AM   - SOMETIMES  MAKES  HER  VOMIT  AND SEES SOMETHING  BULGING  OUT  HER VAGINA  WHEN  SHE COUGHS.             ALSO HAS VAG  ITCHING-  STARTED  SAT   AM.     THINKS  HAS FEVER-  NOT  CHECK TEMP AT HOME.

## 2015-04-26 NOTE — MAU Provider Note (Signed)
History     CSN: [id number]  Arrival date and time: 04/26/15 0159   First Provider Initiated Contact with Patient 04/26/15 0240      Chief Complaint  Patient presents with  . Pelvic Pain  . Cough   HPI 34 y.o. [id number] at 5785w1d w/ cough x 1 day, also c/o "something red bulging out" of vagina, vaginal discharge, itching, burning. Pt has h/o cystocele and rectocele, was using pessary, but d/c'd because "it bothered me". Pt has h/o occasional asthma, states this cough does not feel like her usual asthma symptoms, which are rare. Pt denies fever/chills. Pt was seen in MAU yesterday w/ headache and UTI symptoms, taking fiorcet for headache and keflex for UTI.   Past Medical History  Diagnosis Date  . Asthma   . Asthma   . Preterm labor     1st pregnancy d/t pre-e  . Pregnancy induced hypertension     previous pregnancy  . Pyelonephritis     June 2013  . Varicose veins   . Vaginal pessary in situ     Past Surgical History  Procedure Laterality Date  . No past surgeries      Family History  Problem Relation Age of Onset  . Anesthesia problems Neg Hx   . Hypotension Neg Hx   . Malignant hyperthermia Neg Hx   . Pseudochol deficiency Neg Hx   . Other Neg Hx   . Diabetes Mother   . Asthma Father     History  Substance Use Topics  . Smoking status: Former Games developermoker  . Smokeless tobacco: Never Used     Comment: yrs ago  . Alcohol Use: No    Allergies:  Allergies  Allergen Reactions  . Iohexol Hives and Shortness Of Breath     Code: SOB, Desc: IMMEDIATELY SOB FOLLOWING IV INJECTION, SPOTTY HIVES, PT GIVEN BENADRYL AND EPI-ARS 08/22/09, Onset Date: [id number]     Prescriptions prior to admission  Medication Sig Dispense Refill Last Dose  . acetaminophen (TYLENOL) 500 MG tablet Take 2 tablets (1,000 mg total) by mouth every 6 (six) hours as needed for moderate pain. 30 tablet 0 Past Week at Unknown time  . butalbital-acetaminophen-caffeine (FIORICET) 50-325-40 MG per tablet  Take 1-2 tablets by mouth every 6 (six) hours as needed for headache. 20 tablet 0 04/25/2015 at Unknown time  . cephALEXin (KEFLEX) 500 MG capsule Take 1 capsule (500 mg total) by mouth 4 (four) times daily. 20 capsule 0 04/25/2015 at Unknown time  . Prenatal Vit-Fe Fumarate-FA (PRENATAL MULTIVITAMIN) TABS tablet Take 1 tablet by mouth daily at 12 noon.   04/25/2015 at Unknown time    Review of Systems  Constitutional: Negative.  Negative for fever and chills.  HENT: Negative for congestion and sore throat.   Respiratory: Positive for cough. Negative for shortness of breath and wheezing.   Cardiovascular: Negative.  Negative for chest pain and palpitations.  Gastrointestinal: Negative for nausea, vomiting, abdominal pain, diarrhea and constipation.  Genitourinary: Negative for dysuria, urgency, frequency, hematuria and flank pain.       Negative for vaginal bleeding, + discharge & itching  Musculoskeletal: Negative.   Neurological: Positive for headaches.  Psychiatric/Behavioral: Negative.    Physical Exam   Blood pressure 117/61, pulse 70, temperature 97.7 F (36.5 C), temperature source Oral, height 5\' 3"  (1.6 m), weight 135 lb 2 oz (61.292 kg), last menstrual period 02/19/2015, SpO2 100 %, not currently breastfeeding.  Physical Exam  Vitals reviewed. Constitutional: She is oriented to  person, place, and time. She appears well-developed and well-nourished. No distress.  Cardiovascular: Normal rate.   Respiratory: Effort normal and breath sounds normal. No respiratory distress. She has no wheezes. She has no rales.  + cough  GI: Soft. There is no tenderness.  Genitourinary: There is no rash, tenderness or lesion on the right labia. There is no rash, tenderness or lesion on the left labia. No erythema or bleeding in the vagina.  Large cystocele, exacerbated w/ coughing  Musculoskeletal: Normal range of motion.  Neurological: She is alert and oriented to person, place, and time.  Skin:  Skin is dry.  Psychiatric: She has a normal mood and affect.    MAU Course  Procedures  Results for orders placed or performed during the hospital encounter of 04/26/15 (from the past 24 hour(s))  Wet prep, genital     Status: Abnormal   Collection Time: 04/26/15  2:40 AM  Result Value Ref Range   Yeast Wet Prep HPF POC NONE SEEN NONE SEEN   Trich, Wet Prep NONE SEEN NONE SEEN   Clue Cells Wet Prep HPF POC NONE SEEN NONE SEEN   WBC, Wet Prep HPF POC FEW (A) NONE SEEN     Assessment and Plan   1. Cough   2. Cystocele affecting pregnancy in first trimester   Pt w/ cough, lungs CTAB, afebrile, significant bulging of cystocele w/ coughing - rx Robitussin AC, f/u at scheduled appointment on 5/18, discuss cystocele/pessary at that time, f/u sooner w/ worsening cough/SOB/fever    Medication List    TAKE these medications        acetaminophen 500 MG tablet  Commonly known as:  TYLENOL  Take 2 tablets (1,000 mg total) by mouth every 6 (six) hours as needed for moderate pain.     butalbital-acetaminophen-caffeine 50-325-40 MG per tablet  Commonly known as:  FIORICET  Take 1-2 tablets by mouth every 6 (six) hours as needed for headache.     cephALEXin 500 MG capsule  Commonly known as:  KEFLEX  Take 1 capsule (500 mg total) by mouth 4 (four) times daily.     guaiFENesin-codeine 100-10 MG/5ML syrup  Commonly known as:  ROBITUSSIN AC  Take 5 mLs by mouth 3 (three) times daily as needed for cough.     prenatal multivitamin Tabs tablet  Take 1 tablet by mouth daily at 12 noon.        Follow-up Information    Follow up with [facility].   Why:  as scheduled   Contact information:   [address] [place] [id number] [phone]        Duante Arocho 04/26/2015, 3:05 AM

## 2015-04-26 NOTE — MAU Note (Signed)
Pt reprots she has a cough and still having vaginal irritation and pain. tol she had an infection yesterday.

## 2015-04-28 ENCOUNTER — Ambulatory Visit (INDEPENDENT_AMBULATORY_CARE_PROVIDER_SITE_OTHER): Payer: Self-pay | Admitting: Family Medicine

## 2015-04-28 VITALS — BP 102/66 | HR 74 | Temp 98.3°F | Wt 130.0 lb

## 2015-04-28 DIAGNOSIS — J019 Acute sinusitis, unspecified: Secondary | ICD-10-CM | POA: Insufficient documentation

## 2015-04-28 DIAGNOSIS — J018 Other acute sinusitis: Secondary | ICD-10-CM

## 2015-04-28 DIAGNOSIS — O09291 Supervision of pregnancy with other poor reproductive or obstetric history, first trimester: Secondary | ICD-10-CM

## 2015-04-28 MED ORDER — ALBUTEROL SULFATE HFA 108 (90 BASE) MCG/ACT IN AERS: 2.0000 | INHALATION_SPRAY | Freq: Four times a day (QID) | RESPIRATORY_TRACT | Status: DC | PRN

## 2015-04-28 MED ORDER — AMOXICILLIN-POT CLAVULANATE 875-125 MG PO TABS: 1.0000 | ORAL_TABLET | Freq: Two times a day (BID) | ORAL | Status: DC

## 2015-04-28 NOTE — Patient Instructions (Signed)
Please STOP the Keflex START Augmentin twice daily for the cough, ear pain, and face pain Please continue to take prenatal vitamins I have referred you to high risk OB (at the [facility]) given your history of pre-eclampsia  Please remember to go to your OB ultrasound appointment.  Primer trimestre de Psychiatrist (First Trimester of Pregnancy) El primer trimestre de Psychiatrist se extiende desde la semana1 hasta el final de la semana12 (mes1 al mes3). Una semana despus de que un espermatozoide fecunda un vulo, este se implantar en la pared uterina. Este embrin comenzar a Camera operator convertirse en un beb. Sus genes y los de su pareja forman el beb. Los genes del varn determinan si ser un nio o una nia. Entre la semana6 y [place], se forman los ojos y [place], y los latidos del corazn pueden verse en la ecografa. Al final de las 12semanas, todos los rganos del beb estn formados.  Ahora que est embarazada, querr hacer todo lo que est a su alcance para tener un beb sano. Dos de las cosas ms importantes son Winferd Humphrey buena atencin prenatal y seguir las indicaciones del mdico. La atencin prenatal incluye toda la asistencia mdica que usted recibe antes del nacimiento del beb. Esta ayudar a prevenir, Engineer, manufacturing y tratar cualquier problema durante el embarazo y [place]. CAMBIOS EN EL ORGANISMO Su organismo atraviesa por muchos cambios durante el [place], y estos varan de Neomia Dear mujer a Educational psychologist.   Al principio, puede aumentar o bajar algunos kilos.  Puede tener Programme researcher, broadcasting/film/video (nuseas) y vomitar. Si no puede controlar los vmitos, llame al mdico.  Puede cansarse con facilidad.  Es posible que tenga dolores de cabeza que pueden aliviarse con los medicamentos que el mdico le permita tomar.  Puede orinar con mayor frecuencia. El dolor al orinar puede significar que usted tiene una infeccin de la vejiga.  Debido al Vanetta Mulders, puede tener acidez estomacal.  Puede  estar estreida, ya que ciertas hormonas enlentecen los movimientos de los msculos que [employer] desechos a travs de los intestinos.  Pueden aparecer hemorroides o abultarse e hincharse las venas (venas varicosas).  Las ConAgra Foods pueden empezar a Government social research officer y Emergency planning/management officer. Los pezones pueden sobresalir ms, y el tejido que los rodea (areola) tornarse ms oscuro.  Las Veterinary surgeon y estar sensibles al cepillado y al hilo dental.  Pueden aparecer zonas oscuras o manchas (cloasma, mscara del Psychiatrist) en el rostro que probablemente se atenuarn despus del nacimiento del beb.  Los perodos menstruales se interrumpirn.  Tal vez no tenga apetito.  Puede sentir un fuerte deseo de consumir ciertos alimentos.  Puede tener cambios a Theatre manager a da, por ejemplo, por momentos puede estar emocionada por el Psychiatrist y por otros preocuparse porque algo pueda salir mal con el embarazo o el beb.  Tendr sueos ms vvidos y extraos.  Tal vez haya cambios en el cabello que pueden incluir su engrosamiento, crecimiento rpido y cambios en la textura. A algunas mujeres tambin se les cae el cabello durante o despus del [place], o tienen el cabello seco o fino. Lo ms probable es que el cabello se le normalice despus del nacimiento del beb. QU DEBE ESPERAR EN LAS CONSULTAS PRENATALES Durante una visita prenatal de rutina:  La pesarn para asegurarse de que usted y el beb estn creciendo normalmente.  Le controlarn la presin arterial.  Le medirn el abdomen para controlar el desarrollo del beb.  Se escucharn los latidos cardacos a Glass blower/designer de  la semana10 o la12 de embarazo, aproximadamente.  Se analizarn los resultados de los estudios solicitados en visitas anteriores. El mdico puede preguntarle:  Cmo se siente.  Si siente los movimientos del beb.  Si ha tenido sntomas anormales, como prdida de lquido, Fishers Islandsangrado, dolores de cabeza intensos o clicos  abdominales.  Si tiene Colgate-Palmolivealguna pregunta. Otros estudios que pueden realizarse durante el primer trimestre incluyen lo siguiente:  Anlisis de sangre para determinar el tipo de sangre y Engineer, manufacturingdetectar la presencia de infecciones previas. Adems, se los usar para controlar si los niveles de hierro son bajos (anemia) y Chief Strategy Officerdeterminar los anticuerpos Rh. En una etapa ms avanzada del [place]ertileembarazo, se harn anlisis de sangre para saber si tiene diabetes, junto con otros estudios si surgen problemas.  Anlisis de orina para detectar infecciones, diabetes o protenas en la orina.  Una ecografa para confirmar que el beb crece y se desarrolla correctamente.  Una amniocentesis para diagnosticar posibles problemas genticos.  Estudios del feto para descartar espina bfida y sndrome de [place].  Es posible que necesite otras pruebas adicionales. INSTRUCCIONES PARA EL CUIDADO EN EL HOGAR  Medicamentos:  Siga las indicaciones del mdico en relacin con el uso de medicamentos. Durante el embarazo, hay medicamentos que pueden tomarse y otros que no.  Tome las vitaminas prenatales como se le indic.  Si est estreida, tome un laxante suave, si el mdico lo Lib[place] Arab Jamahiriyaautoriza. Dieta  Consuma alimentos balanceados. Elija alimentos variados, como carne o protenas de origen vegetal, pescado, leche y productos lcteos descremados, verduras, frutas y panes y Radiation protection practitionercereales integrales. El mdico la ayudar a Production assistant, radiodeterminar la cantidad de peso que puede Albanyaumentar.  No coma carne cruda ni quesos sin cocinar. Estos elementos contienen bacterias que pueden causar defectos congnitos en el beb.  La ingesta diaria de cuatro o cinco comidas pequeas en lugar de tres comidas abundantes puede ayudar a Yahooaliviar las nuseas y los vmitos. Si empieza a tener nuseas, comer algunas [address]galletas saladas puede ser de Las Ollasayuda. Beber lquidos National Cityentre las comidas en lugar de tomarlos durante las comidas tambin puede ayudar a Optician, dispensingcalmar las nuseas y los vmitos.  Si  est estreida, consuma alimentos con alto contenido de Powellfibra, como verduras y frutas frescas, y Radiation protection practitionercereales integrales. Beba suficiente lquido para Photographermantener la orina clara o de color amarillo plido. Actividad y Landscape architectejercicios  Haga ejercicio solamente como se lo haya indicado el mdico. El ejercicio la ayudar a:  Art gallery managerControlar el peso.  Mantenerse en forma.  Estar preparada para el trabajo de parto y [place] Groveel parto.  Los dolores, los clicos en la parte baja del abdomen o los calambres en la cintura son un buen indicio de que debe dejar de Corporate treasurerhacer ejercicios. Consulte al mdico antes de seguir haciendo ejercicios normales.  Intente no estar de pie FedExdurante mucho tiempo. Mueva las piernas con frecuencia si debe estar de pie en un lugar durante mucho tiempo.  Evite levantar pesos Fortune Brandsexcesivos.  Use zapatos de tacones bajos y Brazilmantenga una buena postura.  Puede seguir teniendo The St. Paul Travelersrelaciones sexuales, excepto que el mdico le indique lo contrario. Alivio del dolor o las molestias  Use un sostn que le brinde buen soporte si siente dolor a la palpacin Mattelen las mamas.  Dese baos de asiento con agua tibia para Engineer, materialsaliviar el dolor o las molestias causadas por las hemorroides. Use crema antihemorroidal si el mdico se lo permite.  Descanse con las piernas elevadas si tiene calambres o dolor de cintura.  Si tiene venas varicosas en las piernas, use medias  de descanso. Eleve los pies durante 15minutos, 3 o 4veces por da. Limite la cantidad de sal en su dieta. Cuidados prenatales  Programe las visitas prenatales para la semana12 de [place]embarazo. Generalmente se programan cada mes al principio y se hacen ms frecuentes en los 2 ltimos meses antes del parto.  Escriba sus preguntas. Llvelas cuando concurra a las visitas prenatales.  Concurra a todas las visitas prenatales como se lo haya indicado el mdico. Seguridad  Colquese el cinturn de seguridad cuando conduzca.  Haga una lista de los nmeros de telfono de  Associate Professoremergencia, que W. R. Berkleyincluya los nmeros de telfono de familiares, Aitkinamigos, el hospital y los departamentos de polica y bomberos. Consejos generales  Pdale al mdico que la derive a clases de educacin prenatal en su localidad. Debe comenzar a tomar las clases antes de Cytogeneticistentrar en el mes6 de embarazo.  Pida ayuda si tiene necesidades nutricionales o de asesoramiento Academic librariandurante el embarazo. El mdico puede aconsejarla o derivarla a especialistas para que la ayuden con diferentes necesidades.  No se d baos de inmersin en agua caliente, baos turcos ni saunas.  No se haga duchas vaginales ni use tampones o toallas higinicas perfumadas.  No mantenga las piernas cruzadas durante South Bethanymucho tiempo.  Evite el contacto con las bandejas sanitarias de los gatos y la tierra que estos animales usan. Estos elementos contienen bacterias que pueden causar defectos congnitos al beb y la posible prdida del feto debido a un aborto espontneo o muerte fetal.  No fume, no consuma hierbas ni medicamentos que no hayan sido recetados por el mdico. Las sustancias qumicas que estos productos contienen afectan la formacin y el desarrollo del beb.  Programe una cita con el dentista. En su casa, lvese los dientes con un cepillo dental blando y psese el hilo dental con suavidad. SOLICITE ATENCIN MDICA SI:   Tiene mareos.  Siente clicos leves, presin en la pelvis o dolor persistente en el abdomen.  Tiene nuseas, vmitos o diarrea persistentes.  Tiene secrecin vaginal con mal olor.  Siente dolor al ConocoPhillipsorinar.  Tiene el rostro, las Cambridgemanos, las piernas o los tobillos ms hinchados. SOLICITE ATENCIN MDICA DE INMEDIATO SI:   Tiene fiebre.  Tiene una prdida de lquido por la vagina.  Tiene sangrado o pequeas prdidas vaginales.  Siente dolor intenso o clicos en el abdomen.  Sube o baja de peso rpidamente.  Vomita sangre de color rojo brillante o material que parezca granos de caf.  Ha estado  expuesta a la rubola y no ha sufrido la enfermedad.  Ha estado expuesta a la quinta enfermedad o a la varicela.  Tiene un dolor de cabeza intenso.  Le falta el aire.  Sufre cualquier tipo de traumatismo, por ejemplo, debido a una cada o un accidente automovilstico. Document Released: 09/07/2005 Document Revised: 04/14/2014 [facility] Patient Information 2015 [place] BelleExitCare, [place]. This information is not intended to replace advice given to you by your health care provider. Make sure you discuss any questions you have with your health care provider.

## 2015-04-28 NOTE — Assessment & Plan Note (Signed)
Patient noted to have otalgias, facial pain worsened by bending over, cough, rhinorrhea, and headache consistent with sinusitis.  Lungs clear on exam. Oxygen saturation 98% on room air. Patient without improvement with Keflex. Given that urine culture negative from MAU visit, will discontinue Keflex and start Augmentin twice a day. We'll continue with Robitussin-AC. Return to clinic precautions discussed and understood.

## 2015-04-28 NOTE — Progress Notes (Signed)
[name] is a 34 y.o. yo [phone] at 4014w3d who presents for her initial prenatal visit. Pregnancy  isplanned She reports numerous complaints. She has nausea with cough but no vomiting. Poor appetite due to illness. Last ate yesterday afternoon (sandwhich).  Still drinking well.  Friday the patient went to the MAU with abdominal pain: was diagnosed with UTI and Rx Keflex (from looking at urine cx, not convincing for UTI). Patient states she has some lower abdominal pain with walking, but she's had this during each pregnancy due to her abdomen (has always had round ligament pain in 1st trimester per her report).  Cough and subjective fever started on Saturday, the cough persisted, she feels she has too much medicine and it's not helping.  Cough is stable currently. Cough is non-productive and most prominent in the morning after waking up.  Cough is making her cystocele worse.  Pain in left ear and teeth. Headache (feels like head is going to "pop"). Most of the pain is in her face.  +Rhinorrhea, sore throat in the AM, watery eyes, and sneezing. Denies neck pain. She was prescribed Robitussin AC and she's been taking it 3 times a day but makes her sleepy and doesn't make the cough better.  She feels really hot and sweaty, has never taken her temperature at home. She has had allergic rhinitis and hasn't taken Allegra-D in quite some time. Also has a h/o asthma but doesn't use anything currently.  She  isTaking PNV (however admits to not taking regularly as there is too much medicine she's supposed to take). See flow sheet for details.  PMH, POBH, FH, meds, allergies and Social Hx reviewed. Of note, the patient had pre-eclampsia with her 1st pregnancy requiring induction at 35wks (delivered at 35+1) and PIH with 2nd pregnancy (unknown if pre-E) which required IOL at 37wks.  No tobacco use or alcohol use. Feels safe at home (lives with an elderly lady, partner, and her 3 sons) No cats at  home Strong family h/o DM (mom, brothers, and cousins). Patient was scheduled to have one hour Glucola performed today, however due to nausea and illness does not wish to do this as she feels she will vomited up.  Prenatal exam:Gen: Well nourished, well developed.  Diaphoretic, appears fatigued.  Vitals noted. HEENT: Normocephalic, atraumatic.  Neck supple with spotty cervical lymphadenopathy. No thyromegaly or thyroid nodules.  fair dentition. Erythematous ear canals. Bulging of the right TM with erythema and fluid. Left TM without erythema, however some fluid noted.  CV: RRR no murmur, gallops or rubs Lungs: No increased work of breathing. CTA B.  Normal respiratory effort without wheezes or rales. Pulse oximetry in clinic noted to be 98% Abd: soft, NTND. +BS.  Uterus not appreciated above pelvis. GU: Deferred, as this was done at previous visit. Ext: No clubbing, cyanosis or edema. Psych: Normal grooming and dress.  Not depressed or anxious appearing.  Normal thought content and process without flight of ideas or looseness of associations    Assessment/plan: 1) Pregnancy 1914w3d doing well.  Current pregnancy issues include subchorionic hemorrhage noted on previous ultrasound. Patient scheduled to have a follow-up ultrasound on 520. Patient also has a significant history of preeclampsia requiring induction of labor. Will refer to high risk OB. Patient also has a large cystocele, worsened by cough. Dating is reliable Prenatal labs reviewed, notable for nothing. Bleeding and pain precautions reviewed. Importance of prenatal vitamins reviewed.  Genetic screening offered.  Early glucola is indicated.  Patient also  noted to have otitis media, facial pain worsened by bending over, cough, rhinorrhea, and headache consistent with sinusitis. Patient without improvement with Keflex. Given that urine culture negative from MAU visit, will discontinue Keflex and start Augmentin twice a day. We'll continue  with Robitussin-AC. Given patient's history of asthma, and the possibility of worsening asthma during pregnancy, an albuterol inhaler when necessary was prescribed. Return to clinic precautions discussed and understood.   Follow up 2 weeks due to illness.  This was precepted with Dr. Mauricio PoBreen

## 2015-04-28 NOTE — Progress Notes (Signed)
I was the preceptor for this visit. 

## 2015-05-01 ENCOUNTER — Ambulatory Visit (HOSPITAL_COMMUNITY)
Admission: RE | Admit: 2015-05-01 | Discharge: 2015-05-01 | Disposition: A | Discharge status: Home / Self Care | Payer: Self-pay | Source: Ambulatory Visit | Attending: Family Medicine | Admitting: Family Medicine

## 2015-05-01 DIAGNOSIS — O468X1 Other antepartum hemorrhage, first trimester: Secondary | ICD-10-CM | POA: Insufficient documentation

## 2015-05-01 DIAGNOSIS — Z3A09 9 weeks gestation of pregnancy: Secondary | ICD-10-CM | POA: Insufficient documentation

## 2015-05-01 DIAGNOSIS — O418X10 Other specified disorders of amniotic fluid and membranes, first trimester, not applicable or unspecified: Secondary | ICD-10-CM

## 2015-05-07 ENCOUNTER — Inpatient Hospital Stay (HOSPITAL_COMMUNITY)
Admission: AD | Admit: 2015-05-07 | Discharge: 2015-05-08 | Disposition: A | Discharge status: Home / Self Care | Payer: Self-pay | Source: Ambulatory Visit | Attending: Obstetrics & Gynecology | Admitting: Obstetrics & Gynecology

## 2015-05-07 ENCOUNTER — Encounter (HOSPITAL_COMMUNITY): Payer: Self-pay | Admitting: *Deleted

## 2015-05-07 DIAGNOSIS — O219 Vomiting of pregnancy, unspecified: Secondary | ICD-10-CM

## 2015-05-07 DIAGNOSIS — O26899 Other specified pregnancy related conditions, unspecified trimester: Secondary | ICD-10-CM

## 2015-05-07 DIAGNOSIS — R197 Diarrhea, unspecified: Secondary | ICD-10-CM | POA: Insufficient documentation

## 2015-05-07 DIAGNOSIS — R112 Nausea with vomiting, unspecified: Secondary | ICD-10-CM | POA: Insufficient documentation

## 2015-05-07 DIAGNOSIS — O9989 Other specified diseases and conditions complicating pregnancy, childbirth and the puerperium: Secondary | ICD-10-CM | POA: Insufficient documentation

## 2015-05-07 DIAGNOSIS — R109 Unspecified abdominal pain: Secondary | ICD-10-CM | POA: Insufficient documentation

## 2015-05-07 DIAGNOSIS — Z87891 Personal history of nicotine dependence: Secondary | ICD-10-CM | POA: Insufficient documentation

## 2015-05-07 NOTE — MAU Note (Signed)
Pt reports lower abd pain since 4 pm, vomited x 2 and has a headache.

## 2015-05-08 ENCOUNTER — Encounter (HOSPITAL_COMMUNITY): Payer: Self-pay

## 2015-05-08 DIAGNOSIS — O9989 Other specified diseases and conditions complicating pregnancy, childbirth and the puerperium: Secondary | ICD-10-CM

## 2015-05-08 DIAGNOSIS — O219 Vomiting of pregnancy, unspecified: Secondary | ICD-10-CM

## 2015-05-08 DIAGNOSIS — Z3A11 11 weeks gestation of pregnancy: Secondary | ICD-10-CM

## 2015-05-08 DIAGNOSIS — R109 Unspecified abdominal pain: Secondary | ICD-10-CM

## 2015-05-08 LAB — URINE MICROSCOPIC-ADD ON

## 2015-05-08 LAB — URINALYSIS, ROUTINE W REFLEX MICROSCOPIC
Bilirubin Urine: NEGATIVE
GLUCOSE, UA: NEGATIVE mg/dL
Ketones, ur: NEGATIVE mg/dL
NITRITE: NEGATIVE
Protein, ur: NEGATIVE mg/dL
SPECIFIC GRAVITY, URINE: 1.02 (ref 1.005–1.030)
Urobilinogen, UA: 0.2 mg/dL (ref 0.0–1.0)
pH: 6 (ref 5.0–8.0)

## 2015-05-08 MED ORDER — PROMETHAZINE HCL 25 MG PO TABS: 12.5000 mg | ORAL_TABLET | Freq: Four times a day (QID) | ORAL | Status: DC | PRN

## 2015-05-08 MED ORDER — CYCLOBENZAPRINE HCL 10 MG PO TABS: 10.0000 mg | ORAL_TABLET | Freq: Two times a day (BID) | ORAL | Status: DC | PRN

## 2015-05-08 MED ORDER — CYCLOBENZAPRINE HCL 10 MG PO TABS
10.0000 mg | ORAL_TABLET | Freq: Once | ORAL | Status: AC
  Administered 2015-05-08: 10 mg via ORAL
  Filled 2015-05-08: qty 1

## 2015-05-08 MED ORDER — PROMETHAZINE HCL 25 MG PO TABS
25.0000 mg | ORAL_TABLET | Freq: Once | ORAL | Status: AC
  Administered 2015-05-08: 25 mg via ORAL
  Filled 2015-05-08: qty 1

## 2015-05-08 NOTE — MAU Provider Note (Signed)
History     CSN: [id number]  Arrival date and time: 05/07/15 2337   First Provider Initiated Contact with Patient 05/08/15 0046      Chief Complaint  Patient presents with  . Abdominal Pain   HPI Comments: [name] is a 34 y.o. [phone] at 4175w2d who presents today with abdominal pain. She states that the pain started today around 1600. She tried two tylenol, but it did not help. She has vomited x2 today, and had diarrhea x 1 today. She denies any fever or sick contacts. She denies any vaginal bleeding or vaginal discharge.   Abdominal Pain This is a new problem. The current episode started today. The onset quality is gradual. The problem occurs constantly. The problem has been gradually worsening. The pain is located in the suprapubic region. The pain is at a severity of 8/10. The quality of the pain is sharp. The abdominal pain does not radiate. Associated symptoms include diarrhea (x1 today) and vomiting (x2 today). Pertinent negatives include no constipation, dysuria, fever, frequency or nausea. Nothing aggravates the pain. The pain is relieved by nothing. She has tried acetaminophen for the symptoms. The treatment provided no relief. Prior diagnostic workup includes ultrasound.    Past Medical History  Diagnosis Date  . Asthma   . Asthma   . Preterm labor     1st pregnancy d/t pre-e  . Pregnancy induced hypertension     previous pregnancy  . Pyelonephritis     June 2013  . Varicose veins   . Vaginal pessary in situ     Past Surgical History  Procedure Laterality Date  . No past surgeries      Family History  Problem Relation Age of Onset  . Anesthesia problems Neg Hx   . Hypotension Neg Hx   . Malignant hyperthermia Neg Hx   . Pseudochol deficiency Neg Hx   . Other Neg Hx   . Diabetes Mother   . Asthma Father     History  Substance Use Topics  . Smoking status: Former Games developermoker  . Smokeless tobacco: Never Used     Comment: yrs ago  . Alcohol Use: No     Allergies:  Allergies  Allergen Reactions  . Iohexol Hives and Shortness Of Breath     Code: SOB, Desc: IMMEDIATELY SOB FOLLOWING IV INJECTION, SPOTTY HIVES, PT GIVEN BENADRYL AND EPI-ARS 08/22/09, Onset Date: [id number]     Prescriptions prior to admission  Medication Sig Dispense Refill Last Dose  . acetaminophen (TYLENOL) 500 MG tablet Take 2 tablets (1,000 mg total) by mouth every 6 (six) hours as needed for moderate pain. 30 tablet 0 Past Week at Unknown time  . albuterol (PROVENTIL HFA;VENTOLIN HFA) 108 (90 BASE) MCG/ACT inhaler Inhale 2 puffs into the lungs every 6 (six) hours as needed for wheezing or shortness of breath. 1 Inhaler 0   . amoxicillin-clavulanate (AUGMENTIN) 875-125 MG per tablet Take 1 tablet by mouth 2 (two) times daily. 14 tablet 0   . butalbital-acetaminophen-caffeine (FIORICET) 50-325-40 MG per tablet Take 1-2 tablets by mouth every 6 (six) hours as needed for headache. 20 tablet 0 04/25/2015 at Unknown time  . guaiFENesin-codeine (ROBITUSSIN AC) 100-10 MG/5ML syrup Take 5 mLs by mouth 3 (three) times daily as needed for cough. 120 mL 0   . Prenatal Vit-Fe Fumarate-FA (PRENATAL MULTIVITAMIN) TABS tablet Take 1 tablet by mouth daily at 12 noon.   04/25/2015 at Unknown time    Review of Systems  Constitutional: Negative for  fever.  Gastrointestinal: Positive for vomiting (x2 today), abdominal pain and diarrhea (x1 today). Negative for nausea and constipation.  Genitourinary: Negative for dysuria, urgency and frequency.   Physical Exam   Blood pressure 92/60, pulse 65, temperature 97.5 F (36.4 C), temperature source Oral, resp. rate 15, height  (1.6 m), weight 59.421 kg (131 lb), last menstrual period 02/19/2015, SpO2 100 %, not currently breastfeeding.  Physical Exam  Nursing note and vitals reviewed. Constitutional: She is oriented to person, place, and time. She appears well-developed and well-nourished. No distress.  Cardiovascular: Normal rate.    Respiratory: Effort normal.  GI: Soft. There is no tenderness. There is no rebound.  Neurological: She is alert and oriented to person, place, and time.  Skin: Skin is warm and dry.  Psychiatric: She has a normal mood and affect.    MAU Course  Procedures  MDM 0154: Patient reports pain and nausea have improved  Assessment and Plan   1. Abdominal pain in pregnancy   2. Nausea/vomiting in pregnancy    DC home RX: phenergan and flexeril PRN  Return to MAU as needed 2nd trimester warning signs reviewed FU with OB as planned  Follow-up Information    Follow up with [facility].   Specialty:  Obstetrics and Gynecology   Why:  As scheduled   Contact information:   [address] [place] [address] [phone]       Tawnya Crook 05/08/2015, 12:47 AM

## 2015-05-08 NOTE — Discharge Instructions (Signed)
Second Trimester of Pregnancy The second trimester is from week 13 through week 28, months 4 through 6. The second trimester is often a time when you feel your best. Your body has also adjusted to being pregnant, and you begin to feel better physically. Usually, morning sickness has lessened or quit completely, you may have more energy, and you may have an increase in appetite. The second trimester is also a time when the fetus is growing rapidly. At the end of the sixth month, the fetus is about 9 inches long and weighs about 1 pounds. You will likely begin to feel the baby move (quickening) between 18 and 20 weeks of the pregnancy. BODY CHANGES Your body goes through many changes during pregnancy. The changes vary from woman to woman.   Your weight will continue to increase. You will notice your lower abdomen bulging out.  You may begin to get stretch marks on your hips, abdomen, and breasts.  You may develop headaches that can be relieved by medicines approved by your health care provider.  You may urinate more often because the fetus is pressing on your bladder.  You may develop or continue to have heartburn as a result of your pregnancy.  You may develop constipation because certain hormones are causing the muscles that push waste through your intestines to slow down.  You may develop hemorrhoids or swollen, bulging veins (varicose veins).  You may have back pain because of the weight gain and pregnancy hormones relaxing your joints between the bones in your pelvis and as a result of a shift in weight and the muscles that support your balance.  Your breasts will continue to grow and be tender.  Your gums may bleed and may be sensitive to brushing and flossing.  Dark spots or blotches (chloasma, mask of pregnancy) may develop on your face. This will likely fade after the baby is born.  A dark line from your belly button to the pubic area (linea nigra) may appear. This will likely fade  after the baby is born.  You may have changes in your hair. These can include thickening of your hair, rapid growth, and changes in texture. Some women also have hair loss during or after pregnancy, or hair that feels dry or thin. Your hair will most likely return to normal after your baby is born. WHAT TO EXPECT AT YOUR PRENATAL VISITS During a routine prenatal visit:  You will be weighed to make sure you and the fetus are growing normally.  Your blood pressure will be taken.  Your abdomen will be measured to track your baby's growth.  The fetal heartbeat will be listened to.  Any test results from the previous visit will be discussed. Your health care provider may ask you:  How you are feeling.  If you are feeling the baby move.  If you have had any abnormal symptoms, such as leaking fluid, bleeding, severe headaches, or abdominal cramping.  If you have any questions. Other tests that may be performed during your second trimester include:  Blood tests that check for:  Low iron levels (anemia).  Gestational diabetes (between 24 and 28 weeks).  Rh antibodies.  Urine tests to check for infections, diabetes, or protein in the urine.  An ultrasound to confirm the proper growth and development of the baby.  An amniocentesis to check for possible genetic problems.  Fetal screens for spina bifida and Down syndrome. HOME CARE INSTRUCTIONS   Avoid all smoking, herbs, alcohol, and unprescribed   drugs. These chemicals affect the formation and growth of the baby.  Follow your health care provider's instructions regarding medicine use. There are medicines that are either safe or unsafe to take during pregnancy.  Exercise only as directed by your health care provider. Experiencing uterine cramps is a good sign to stop exercising.  Continue to eat regular, healthy meals.  Wear a good support bra for breast tenderness.  Do not use hot tubs, steam rooms, or saunas.  Wear your  seat belt at all times when driving.  Avoid raw meat, uncooked cheese, cat litter boxes, and soil used by cats. These carry germs that can cause birth defects in the baby.  Take your prenatal vitamins.  Try taking a stool softener (if your health care provider approves) if you develop constipation. Eat more high-fiber foods, such as fresh vegetables or fruit and whole grains. Drink plenty of fluids to keep your urine clear or pale yellow.  Take warm sitz baths to soothe any pain or discomfort caused by hemorrhoids. Use hemorrhoid cream if your health care provider approves.  If you develop varicose veins, wear support hose. Elevate your feet for 15 minutes, 3-4 times a day. Limit salt in your diet.  Avoid heavy lifting, wear low heel shoes, and practice good posture.  Rest with your legs elevated if you have leg cramps or low back pain.  Visit your dentist if you have not gone yet during your pregnancy. Use a soft toothbrush to brush your teeth and be gentle when you floss.  A sexual relationship may be continued unless your health care provider directs you otherwise.  Continue to go to all your prenatal visits as directed by your health care provider. SEEK MEDICAL CARE IF:   You have dizziness.  You have mild pelvic cramps, pelvic pressure, or nagging pain in the abdominal area.  You have persistent nausea, vomiting, or diarrhea.  You have a bad smelling vaginal discharge.  You have pain with urination. SEEK IMMEDIATE MEDICAL CARE IF:   You have a fever.  You are leaking fluid from your vagina.  You have spotting or bleeding from your vagina.  You have severe abdominal cramping or pain.  You have rapid weight gain or loss.  You have shortness of breath with chest pain.  You notice sudden or extreme swelling of your face, hands, ankles, feet, or legs.  You have not felt your baby move in over an hour.  You have severe headaches that do not go away with  medicine.  You have vision changes. Document Released: 11/22/2001 Document Revised: 12/03/2013 Document Reviewed: 01/29/2013 ExitCare Patient Information 2015 [employer]. This information is not intended to replace advice given to you by your health care provider. Make sure you discuss any questions you have with your health care provider.  

## 2015-05-12 ENCOUNTER — Encounter: Payer: Self-pay | Admitting: Family Medicine

## 2015-05-18 ENCOUNTER — Encounter: Payer: Self-pay | Admitting: Family Medicine

## 2015-05-22 ENCOUNTER — Emergency Department (HOSPITAL_COMMUNITY): Payer: Self-pay

## 2015-05-22 ENCOUNTER — Encounter (HOSPITAL_COMMUNITY): Payer: Self-pay | Admitting: Emergency Medicine

## 2015-05-22 ENCOUNTER — Emergency Department (HOSPITAL_COMMUNITY)
Admission: EM | Admit: 2015-05-22 | Discharge: 2015-05-23 | Disposition: A | Discharge status: Home / Self Care | Payer: Self-pay | Attending: Emergency Medicine | Admitting: Emergency Medicine

## 2015-05-22 DIAGNOSIS — O132 Gestational [pregnancy-induced] hypertension without significant proteinuria, second trimester: Secondary | ICD-10-CM | POA: Insufficient documentation

## 2015-05-22 DIAGNOSIS — J45909 Unspecified asthma, uncomplicated: Secondary | ICD-10-CM | POA: Insufficient documentation

## 2015-05-22 DIAGNOSIS — R109 Unspecified abdominal pain: Secondary | ICD-10-CM

## 2015-05-22 DIAGNOSIS — Z87448 Personal history of other diseases of urinary system: Secondary | ICD-10-CM | POA: Insufficient documentation

## 2015-05-22 DIAGNOSIS — O99512 Diseases of the respiratory system complicating pregnancy, second trimester: Secondary | ICD-10-CM | POA: Insufficient documentation

## 2015-05-22 DIAGNOSIS — Z8679 Personal history of other diseases of the circulatory system: Secondary | ICD-10-CM | POA: Insufficient documentation

## 2015-05-22 DIAGNOSIS — Z3A14 14 weeks gestation of pregnancy: Secondary | ICD-10-CM | POA: Insufficient documentation

## 2015-05-22 DIAGNOSIS — O9989 Other specified diseases and conditions complicating pregnancy, childbirth and the puerperium: Secondary | ICD-10-CM | POA: Insufficient documentation

## 2015-05-22 DIAGNOSIS — Z79899 Other long term (current) drug therapy: Secondary | ICD-10-CM | POA: Insufficient documentation

## 2015-05-22 DIAGNOSIS — Z87891 Personal history of nicotine dependence: Secondary | ICD-10-CM | POA: Insufficient documentation

## 2015-05-22 DIAGNOSIS — O200 Threatened abortion: Secondary | ICD-10-CM | POA: Insufficient documentation

## 2015-05-22 DIAGNOSIS — Z8751 Personal history of pre-term labor: Secondary | ICD-10-CM | POA: Insufficient documentation

## 2015-05-22 LAB — CBC WITH DIFFERENTIAL/PLATELET
BASOS ABS: 0 10*3/uL (ref 0.0–0.1)
Basophils Relative: 0 % (ref 0–1)
EOS ABS: 0.1 10*3/uL (ref 0.0–0.7)
Eosinophils Relative: 2 % (ref 0–5)
HCT: 31.8 % — ABNORMAL LOW (ref 36.0–46.0)
Hemoglobin: 11.2 g/dL — ABNORMAL LOW (ref 12.0–15.0)
Lymphocytes Relative: 23 % (ref 12–46)
Lymphs Abs: 1.4 10*3/uL (ref 0.7–4.0)
MCH: 31.1 pg (ref 26.0–34.0)
MCHC: 35.2 g/dL (ref 30.0–36.0)
MCV: 88.3 fL (ref 78.0–100.0)
Monocytes Absolute: 0.1 10*3/uL (ref 0.1–1.0)
Monocytes Relative: 2 % — ABNORMAL LOW (ref 3–12)
Neutro Abs: 4.7 10*3/uL (ref 1.7–7.7)
Neutrophils Relative %: 73 % (ref 43–77)
Platelets: 291 10*3/uL (ref 150–400)
RBC: 3.6 MIL/uL — ABNORMAL LOW (ref 3.87–5.11)
RDW: 13.6 % (ref 11.5–15.5)
WBC: 6.4 10*3/uL (ref 4.0–10.5)

## 2015-05-22 LAB — WET PREP, GENITAL
Trich, Wet Prep: NONE SEEN
Yeast Wet Prep HPF POC: NONE SEEN

## 2015-05-22 LAB — COMPREHENSIVE METABOLIC PANEL
ALBUMIN: 3.3 g/dL — AB (ref 3.5–5.0)
ALT: 12 U/L — AB (ref 14–54)
AST: 16 U/L (ref 15–41)
Alkaline Phosphatase: 66 U/L (ref 38–126)
Anion gap: 8 (ref 5–15)
BUN: 5 mg/dL — ABNORMAL LOW (ref 6–20)
CALCIUM: 8.5 mg/dL — AB (ref 8.9–10.3)
CO2: 21 mmol/L — ABNORMAL LOW (ref 22–32)
Chloride: 105 mmol/L (ref 101–111)
Creatinine, Ser: 0.32 mg/dL — ABNORMAL LOW (ref 0.44–1.00)
GFR calc non Af Amer: >60 mL/min (ref >60)
Glucose, Bld: 121 mg/dL — ABNORMAL HIGH (ref 65–99)
Potassium: 3.2 mmol/L — ABNORMAL LOW (ref 3.5–5.1)
Sodium: 134 mmol/L — ABNORMAL LOW (ref 135–145)
Total Bilirubin: 0.4 mg/dL (ref 0.3–1.2)
Total Protein: 6.6 g/dL (ref 6.5–8.1)

## 2015-05-22 LAB — I-STAT BETA HCG BLOOD, ED (MC, WL, AP ONLY): I-stat hCG, quantitative: >2000 m[IU]/mL — ABNORMAL HIGH (ref <5)

## 2015-05-22 LAB — RAPID HIV SCREEN (HIV 1/2 AB+AG)
HIV 1/2 Antibodies: NONREACTIVE
HIV-1 P24 Antigen - HIV24: NONREACTIVE

## 2015-05-22 NOTE — Discharge Instructions (Signed)
Your work-up today was normal-- baby appears to be growing well, normal heart rate. Encourage you to rest for the next few days.   Follow-up at [facility] hospital later this month as scheduled or sooner for new/worsneing symptoms-- recurrent bleeding, increased pain, etc.

## 2015-05-22 NOTE — ED Notes (Signed)
Ultrasound has just been completed---- was done at bedside.

## 2015-05-22 NOTE — ED Provider Notes (Signed)
CSN: [id number]     Arrival date & time 05/22/15  1904 History   First MD Initiated Contact with Patient 05/22/15 2000     Chief Complaint  Patient presents with  . Abdominal Pain     (Consider location/radiation/quality/duration/timing/severity/associated sxs/prior Treatment) Patient is a 34 y.o. female presenting with abdominal pain. The history is provided by the patient and medical records.  Abdominal Pain Associated symptoms: vaginal bleeding     This is a 35 year old G4 P3 approximately 12-[redacted] weeks gestation, presenting to the ED for abdominal pain, pelvic pain, and vaginal bleeding which began this morning. States pain is localized throughout her entire abdomen, described as a cramping pain. States she has had a small amount of bright red spotting. She denies passage of clots.  She states her first child was born at 8 months due to preeclampsia, second pregnancy was normal, third pregnancy was born 2 weeks early due to preeclampsia as well. All births were vaginal deliveries.  She denies any nausea, vomiting, or diarrhea. No fever or chills. No urinary symptoms. Patient is followed by [facility] She had an ultrasound done approximately 1 month ago that confirmed IUP.  She does admit that she was up doing a lot of housework yesterday but denies heavy lifting.  Past Medical History  Diagnosis Date  . Asthma   . Asthma   . Preterm labor     1st pregnancy d/t pre-e  . Pregnancy induced hypertension     previous pregnancy  . Pyelonephritis     June 2013  . Varicose veins   . Vaginal pessary in situ    Past Surgical History  Procedure Laterality Date  . No past surgeries     Family History  Problem Relation Age of Onset  . Anesthesia problems Neg Hx   . Hypotension Neg Hx   . Malignant hyperthermia Neg Hx   . Pseudochol deficiency Neg Hx   . Other Neg Hx   . Diabetes Mother   . Asthma Father    History  Substance Use Topics  . Smoking status: Former Games developer  .  Smokeless tobacco: Never Used     Comment: yrs ago  . Alcohol Use: No   OB History    Gravida Para Term Preterm AB TAB SAB Ectopic Multiple Living   0 0 0 0 0 3     Review of Systems  Gastrointestinal: Positive for abdominal pain.  Genitourinary: Positive for vaginal bleeding.  All other systems reviewed and are negative.     Allergies  Iohexol  Home Medications   Prior to Admission medications   Medication Sig Start Date End Date Taking? Authorizing Provider  acetaminophen (TYLENOL) 500 MG tablet Take 2 tablets (1,000 mg total) by mouth every 6 (six) hours as needed for moderate pain. 03/24/15   Dorathy Kinsman, CNM  albuterol (PROVENTIL HFA;VENTOLIN HFA) 108 (90 BASE) MCG/ACT inhaler Inhale 2 puffs into the lungs every 6 (six) hours as needed for wheezing or shortness of breath. 04/28/15   Joanna Puff, MD  amoxicillin-clavulanate (AUGMENTIN) 875-125 MG per tablet Take 1 tablet by mouth 2 (two) times daily. 04/28/15   Joanna Puff, MD  butalbital-acetaminophen-caffeine (FIORICET) [phone] MG per tablet Take 1-2 tablets by mouth every 6 (six) hours as needed for headache. 04/25/15 04/24/16  Duane Lope, NP  cyclobenzaprine (FLEXERIL) 10 MG tablet Take 1 tablet (10 mg total) by mouth 2 (two) times daily as needed for muscle spasms. 05/08/15  Armando Reichert, CNM  guaiFENesin-codeine (ROBITUSSIN AC) 100-10 MG/5ML syrup Take 5 mLs by mouth 3 (three) times daily as needed for cough. 04/26/15   Archie Patten, CNM  Prenatal Vit-Fe Fumarate-FA (PRENATAL MULTIVITAMIN) TABS tablet Take 1 tablet by mouth daily at 12 noon.    Historical Provider, MD  promethazine (PHENERGAN) 25 MG tablet Take 0.5-1 tablets (12.5-25 mg total) by mouth every 6 (six) hours as needed. 05/08/15   Armando Reichert, CNM   BP 114/64 mmHg  Pulse 84  Temp(Src) 98.2 F (36.8 C) (Oral)  Resp 18  Wt 135 lb (61.236 kg)  SpO2 99%  LMP 02/19/2015 (Approximate)   Physical Exam  Constitutional: She is  oriented to person, place, and time. She appears well-developed and well-nourished.  HENT:  Head: Normocephalic and atraumatic.  Mouth/Throat: Oropharynx is clear and moist.  Eyes: Conjunctivae and EOM are normal. Pupils are equal, round, and reactive to light.  Neck: Normal range of motion.  Cardiovascular: Normal rate, regular rhythm and normal heart sounds.   Pulmonary/Chest: Effort normal and breath sounds normal.  Abdominal: Soft. Bowel sounds are normal.  Genitourinary: No bleeding in the vagina. No foreign body around the vagina. No vaginal discharge found.  Normal female external genitalia without visible lesions; cervical os closed; no visible bleeding  Musculoskeletal: Normal range of motion.  Neurological: She is alert and oriented to person, place, and time.  Skin: Skin is warm and dry.  Psychiatric: She has a normal mood and affect.  Nursing note and vitals reviewed.   ED Course  Procedures (including critical care time) Labs Review Labs Reviewed  WET PREP, GENITAL - Abnormal; Notable for the following:    Clue Cells Wet Prep HPF POC FEW (*)    WBC, Wet Prep HPF POC FEW (*)    All other components within normal limits  CBC WITH DIFFERENTIAL/PLATELET - Abnormal; Notable for the following:    RBC 3.60 (*)    Hemoglobin 11.2 (*)    HCT 31.8 (*)    Monocytes Relative 2 (*)    All other components within normal limits  COMPREHENSIVE METABOLIC PANEL - Abnormal; Notable for the following:    Sodium 134 (*)    Potassium 3.2 (*)    CO2 21 (*)    Glucose, Bld 121 (*)    BUN 5 (*)    Creatinine, Ser 0.32 (*)    Calcium 8.5 (*)    Albumin 3.3 (*)    ALT 12 (*)    All other components within normal limits  I-STAT BETA HCG BLOOD, ED ([facility], [facility], [facility] ONLY) - Abnormal; Notable for the following:    I-stat hCG, quantitative >2000.0 (*)    All other components within normal limits  RAPID HIV SCREEN (HIV 1/2 AB+AG)  RPR  GC/CHLAMYDIA PROBE AMP ([place]) NOT AT [facility]     Imaging Review US Ob Comp Less 14 Wks  05/22/2015   CLINICAL DATA:  Abdominal pain.  Pregnant.  LMP 02/18/2015.  EXAM: LIMITED OBSTETRIC ULTRASOUND  COMPARISON:  Obstetric ultrasound 05/01/2015  FINDINGS: Number of Fetuses: 1  Heart Rate:  149 bpm  Movement: Yes  Presentation: Transverse head maternal right  Placental Location: Posterior  Previa: No  Amniotic Fluid (Subjective):  Within normal limits.  BPD:  2.38cm 14w  0d - [facility] 11/20/2015  MATERNAL FINDINGS:  Cervix:  Appears closed.  Uterus/Adnexae:  No abnormality visualized.  IMPRESSION: 1. No acute findings demonstrated. 2. Expected interval growth compared with prior study.  Best estimated gestational age by current measurements is 14 weeks 0 days.  This exam is performed on an emergent basis and does not comprehensively evaluate fetal size, dating, or anatomy; follow-up complete OB US should be considered if further fetal assessment is warranted.   Electronically Signed   By: Carey Bullocks M.D.   On: 05/22/2015 22:57     EKG Interpretation None      MDM   Final diagnoses:  Vaginal bleeding  Abdominal pain   34 year old G4P3 here with abdominal cramping and vaginal bleeding. She is approximately 12-[redacted] weeks gestation.  She does admit to doing a large amount of housework yesterday. Patient is afebrile, nontoxic. Her cervical os is closed and there is no evidence of active bleeding at this time.  Patient is O+, rhogam not indicated.  OB ultrasound obtained without any evidence of acute findings-- gestation age approx 14 weeks, FHR 149. U/A appears contaminated many squamous cells, culture pending.  Patient has previously scheduled follow-up with OB-GYN next week.  Encouraged rest for the next few days, tylenol as needed for pain.  FU at [facility] hospital sooner if pain continues, further bleeding, etc.  Discussed plan with patient, he/she acknowledged understanding and agreed with plan of care.  Return precautions given for new or worsening  symptoms.  Garlon Hatchet, PA-C 05/23/15 0115  Raeford Razor, MD 05/23/15 2255

## 2015-05-22 NOTE — ED Notes (Addendum)
Pt c/o abd pain, low back pain, HA onset this am. 12-13 Weeks preg. Small amount bright red bleeding. G4P3

## 2015-05-23 LAB — URINALYSIS, ROUTINE W REFLEX MICROSCOPIC
GLUCOSE, UA: NEGATIVE mg/dL
Ketones, ur: 15 mg/dL — AB
Leukocytes, UA: NEGATIVE
Nitrite: NEGATIVE
PH: 5.5 (ref 5.0–8.0)
Protein, ur: NEGATIVE mg/dL
Specific Gravity, Urine: 1.029 (ref 1.005–1.030)
Urobilinogen, UA: 1 mg/dL (ref 0.0–1.0)

## 2015-05-23 LAB — URINE MICROSCOPIC-ADD ON

## 2015-05-23 LAB — RPR: RPR: NONREACTIVE

## 2015-05-23 NOTE — ED Notes (Signed)
Pt was advised of need for urine specimen for urinalysis--- stated unable to give at this time; pt was given ice water.

## 2015-05-24 LAB — URINE CULTURE
Colony Count: NO GROWTH
Culture: NO GROWTH

## 2015-05-25 LAB — GC/CHLAMYDIA PROBE AMP (~~LOC~~) NOT AT ARMC
Chlamydia: NEGATIVE
Neisseria Gonorrhea: NEGATIVE

## 2015-06-01 ENCOUNTER — Encounter: Payer: Self-pay | Admitting: Obstetrics & Gynecology

## 2015-06-01 ENCOUNTER — Other Ambulatory Visit: Payer: Self-pay | Admitting: Obstetrics & Gynecology

## 2015-06-01 ENCOUNTER — Ambulatory Visit (INDEPENDENT_AMBULATORY_CARE_PROVIDER_SITE_OTHER): Payer: Self-pay | Admitting: Obstetrics & Gynecology

## 2015-06-01 VITALS — BP 100/59 | HR 62 | Temp 98.0°F | Wt 134.2 lb

## 2015-06-01 DIAGNOSIS — O0990 Supervision of high risk pregnancy, unspecified, unspecified trimester: Secondary | ICD-10-CM | POA: Insufficient documentation

## 2015-06-01 DIAGNOSIS — Z3492 Encounter for supervision of normal pregnancy, unspecified, second trimester: Secondary | ICD-10-CM

## 2015-06-01 DIAGNOSIS — J45909 Unspecified asthma, uncomplicated: Secondary | ICD-10-CM

## 2015-06-01 DIAGNOSIS — O99519 Diseases of the respiratory system complicating pregnancy, unspecified trimester: Secondary | ICD-10-CM

## 2015-06-01 DIAGNOSIS — O9989 Other specified diseases and conditions complicating pregnancy, childbirth and the puerperium: Secondary | ICD-10-CM

## 2015-06-01 DIAGNOSIS — IMO0001 Reserved for inherently not codable concepts without codable children: Secondary | ICD-10-CM

## 2015-06-01 DIAGNOSIS — Z23 Encounter for immunization: Secondary | ICD-10-CM

## 2015-06-01 DIAGNOSIS — N816 Rectocele: Secondary | ICD-10-CM

## 2015-06-01 LAB — COMPREHENSIVE METABOLIC PANEL
ALBUMIN: 3.7 g/dL (ref 3.5–5.2)
ALK PHOS: 64 U/L (ref 39–117)
ALT: 9 U/L (ref 0–35)
AST: 13 U/L (ref 0–37)
BUN: 5 mg/dL — ABNORMAL LOW (ref 6–23)
CALCIUM: 8.7 mg/dL (ref 8.4–10.5)
CHLORIDE: 104 meq/L (ref 96–112)
CO2: 22 mEq/L (ref 19–32)
Creat: 0.39 mg/dL — ABNORMAL LOW (ref 0.50–1.10)
GLUCOSE: 104 mg/dL — AB (ref 70–99)
POTASSIUM: 3.6 meq/L (ref 3.5–5.3)
Sodium: 138 mEq/L (ref 135–145)
Total Bilirubin: 0.3 mg/dL (ref 0.2–1.2)
Total Protein: 6.3 g/dL (ref 6.0–8.3)

## 2015-06-01 LAB — POCT URINALYSIS DIP (DEVICE)
BILIRUBIN URINE: NEGATIVE
GLUCOSE, UA: NEGATIVE mg/dL
Ketones, ur: NEGATIVE mg/dL
Leukocytes, UA: NEGATIVE
Nitrite: NEGATIVE
Protein, ur: NEGATIVE mg/dL
Specific Gravity, Urine: 1.02 (ref 1.005–1.030)
Urobilinogen, UA: 0.2 mg/dL (ref 0.0–1.0)
pH: 6.5 (ref 5.0–8.0)

## 2015-06-01 MED ORDER — TETANUS-DIPHTH-ACELL PERTUSSIS 5-2.5-18.5 LF-MCG/0.5 IM SUSP
0.5000 mL | Freq: Once | INTRAMUSCULAR | Status: AC
  Administered 2015-06-01: 0.5 mL via INTRAMUSCULAR

## 2015-06-01 NOTE — Progress Notes (Signed)
Subjective:  Cathy Nash is a 34 y.o. [phone] at [redacted]w[redacted]d being seen today for ongoing prenatal care.  Patient reports no complaints.  Contractions: Not present.  Vag. Bleeding: Other.  . Denies leaking of fluid.   The following portions of the patient's history were reviewed and updated as appropriate: allergies, current medications, past family history, past medical history, past social history, past surgical history and problem list.   Objective:   Filed Vitals:   06/01/15 0819  BP: 100/59  Pulse: 62  Temp: 98 F (36.7 C)  Weight: 134 lb 3.2 oz (60.873 kg)    Fetal Status: Fetal Heart Rate (bpm): 150         General:  Alert, oriented and cooperative. Patient is in no acute distress.  Skin: Skin is warm and dry. No rash noted.   Cardiovascular: Normal heart rate noted  Respiratory: Effort and breath sounds normal, no problems with respiration noted  Abdomen: Soft, gravid, appropriate for gestational age. Pain/Pressure: Present     Vaginal: Vag. Bleeding: Other.    Vag D/C Character: Yellow  Cervix: Exam revealed     closed/long/tone  Extremities: Normal range of motion.  Edema: Trace  Mental Status: Normal mood and affect. Normal behavior. Normal judgment and thought content.   Urinalysis: Urine Protein: Negative Urine Glucose: Negative  Assessment and Plan:  Pregnancy: [id number] at [redacted]w[redacted]d  1. Rectocele, Cystocele--No stool or rectal incontinence.  Will refer to Dr. Ashley Royalty for evaluation to see if c/s would be beneficial.  Cervix is closed ad long with tone today. 2.  Genetic counseling--quad screen ordered for 2 weeks from now.  Patient coming back for lab visit only 3.  Wants BTL but no insurance; considering Mirena 4  Asthma and URI--URI improved, stable asthma. 5.  No evidence of CHTN--Had preterm delivery in 2007 at 35 weeks due to preeclampsia (induced). 6.  Anatomy US in 5 weeks. 7.  If all is well with anatomy and cervical length, will refer back to D. Dorsey.   She will need routine prenatal care and monitoring for preeclampsia as with all pregnancies.  Protein/creat ratio and CMP today as baseline.    Preterm labor symptoms and general obstetric precautions including but not limited to vaginal bleeding, contractions, leaking of fluid and fetal movement were reviewed in detail with the patient.  Please refer to After Visit Summary for other counseling recommendations.    Lesly Dukes, MD

## 2015-06-01 NOTE — Addendum Note (Signed)
Addended by: Aldona Lento on: 06/01/2015 09:15 AM   Modules accepted: Orders

## 2015-06-01 NOTE — Addendum Note (Signed)
Addended by: Faythe Casa on: 06/01/2015 09:58 AM   Modules accepted: Orders

## 2015-06-01 NOTE — Progress Notes (Signed)
Dr. Lavella Hammock Uro/GYN appt scheduled for November 23 @ 1000 @ [address]  Pt notified of appt and that if she does not have insurance at appt time she will need to bring $425 to the appt. Pt stated understanding.   [place] scheduled July 21st @ 1000

## 2015-06-01 NOTE — Progress Notes (Signed)
Spanish interpreter Elna Breslow Yesterday pt reports spotting and on this past Sat/Sun she had severe pain.  Pain described as "punching" in lower abd and 7/10 on pain scale.  Early glucola given due to mother has diabetes New ob packet given  UA results small blood  Per Deloris, Pharmacist, due to patient receiving tdap early baby should still receive some antibodies from the tdap vaccine just make sure that pt does not receive two vaccines in same pregnancy.

## 2015-06-02 LAB — PROTEIN / CREATININE RATIO, URINE
Creatinine, Urine: 48 mg/dL
Protein Creatinine Ratio: 0.1 (ref <0.15)
Total Protein, Urine: 5 mg/dL (ref 5–24)

## 2015-06-02 LAB — GLUCOSE TOLERANCE, 1 HOUR (50G) W/O FASTING: GLUCOSE 1 HOUR GTT: 96 mg/dL (ref 70–140)

## 2015-06-04 LAB — AFP, QUAD SCREEN
AFP: 25.5 ng/mL
Age Alone: 1:357 {titer}
Curr Gest Age: 14.4 wks.days
Down Syndrome Scr Risk Est: 1:10100 {titer}
HCG TOTAL: 41.87 [IU]/mL
INH: 116.1 pg/mL
Interpretation-AFP: NEGATIVE
MOM FOR AFP: 0.91
MOM FOR HCG: 0.67
MoM for INH: 0.57
OPEN SPINA BIFIDA: NEGATIVE
Tri 18 Scr Risk Est: NEGATIVE
Trisomy 18 (Edward) Syndrome Interp.: 1:9720 {titer}
UE3 VALUE: 0.51 ng/mL
uE3 Mom: 0.91

## 2015-06-08 ENCOUNTER — Other Ambulatory Visit: Payer: Self-pay

## 2015-07-02 ENCOUNTER — Ambulatory Visit (HOSPITAL_COMMUNITY)
Admission: RE | Admit: 2015-07-02 | Discharge: 2015-07-02 | Disposition: A | Discharge status: Home / Self Care | Payer: Self-pay | Source: Ambulatory Visit | Attending: Obstetrics & Gynecology | Admitting: Obstetrics & Gynecology

## 2015-07-02 DIAGNOSIS — Z3492 Encounter for supervision of normal pregnancy, unspecified, second trimester: Secondary | ICD-10-CM

## 2015-07-02 DIAGNOSIS — Z3A19 19 weeks gestation of pregnancy: Secondary | ICD-10-CM | POA: Insufficient documentation

## 2015-07-02 DIAGNOSIS — Z3689 Encounter for other specified antenatal screening: Secondary | ICD-10-CM | POA: Insufficient documentation

## 2015-07-06 ENCOUNTER — Encounter: Payer: Self-pay | Admitting: Obstetrics and Gynecology

## 2015-07-08 ENCOUNTER — Encounter (HOSPITAL_COMMUNITY): Payer: Self-pay | Admitting: *Deleted

## 2015-07-08 ENCOUNTER — Inpatient Hospital Stay (HOSPITAL_COMMUNITY)
Admission: AD | Admit: 2015-07-08 | Discharge: 2015-07-08 | Disposition: A | Discharge status: Home / Self Care | Payer: Self-pay | Source: Ambulatory Visit | Attending: Obstetrics and Gynecology | Admitting: Obstetrics and Gynecology

## 2015-07-08 DIAGNOSIS — B3731 Acute candidiasis of vulva and vagina: Secondary | ICD-10-CM

## 2015-07-08 DIAGNOSIS — N949 Unspecified condition associated with female genital organs and menstrual cycle: Secondary | ICD-10-CM

## 2015-07-08 DIAGNOSIS — O98812 Other maternal infectious and parasitic diseases complicating pregnancy, second trimester: Secondary | ICD-10-CM | POA: Insufficient documentation

## 2015-07-08 DIAGNOSIS — B373 Candidiasis of vulva and vagina: Secondary | ICD-10-CM | POA: Insufficient documentation

## 2015-07-08 DIAGNOSIS — Z3A19 19 weeks gestation of pregnancy: Secondary | ICD-10-CM | POA: Insufficient documentation

## 2015-07-08 DIAGNOSIS — R109 Unspecified abdominal pain: Secondary | ICD-10-CM | POA: Insufficient documentation

## 2015-07-08 DIAGNOSIS — Z87891 Personal history of nicotine dependence: Secondary | ICD-10-CM | POA: Insufficient documentation

## 2015-07-08 LAB — URINE MICROSCOPIC-ADD ON

## 2015-07-08 LAB — URINALYSIS, ROUTINE W REFLEX MICROSCOPIC
BILIRUBIN URINE: NEGATIVE
Glucose, UA: NEGATIVE mg/dL
KETONES UR: NEGATIVE mg/dL
Nitrite: NEGATIVE
PH: 5.5 (ref 5.0–8.0)
PROTEIN: NEGATIVE mg/dL
SPECIFIC GRAVITY, URINE: 1.025 (ref 1.005–1.030)
Urobilinogen, UA: 0.2 mg/dL (ref 0.0–1.0)

## 2015-07-08 LAB — WET PREP, GENITAL: TRICH WET PREP: NONE SEEN

## 2015-07-08 MED ORDER — CYCLOBENZAPRINE HCL 10 MG PO TABS
10.0000 mg | ORAL_TABLET | Freq: Once | ORAL | Status: AC
  Administered 2015-07-08: 10 mg via ORAL
  Filled 2015-07-08: qty 1

## 2015-07-08 MED ORDER — CYCLOBENZAPRINE HCL 10 MG PO TABS: 10.0000 mg | ORAL_TABLET | Freq: Two times a day (BID) | ORAL | Status: DC | PRN

## 2015-07-08 MED ORDER — FLUCONAZOLE 150 MG PO TABS
150.0000 mg | ORAL_TABLET | Freq: Once | ORAL | Status: AC
  Administered 2015-07-08: 150 mg via ORAL
  Filled 2015-07-08: qty 1

## 2015-07-08 NOTE — MAU Provider Note (Signed)
History     CSN: [id number]  Arrival date and time: 07/08/15 4098   First Provider Initiated Contact with Patient 07/08/15 0113      No chief complaint on file.  HPI  [name] is a 34 y.o. [phone] at [redacted]w[redacted]d who presents to MAU today with complaint of midline suprapubic pain that has been intermittent x 3 weeks. She states that pain started again around 2100 last night. She took Tylenol with minimal relief. She states that pain started after a fall at the pool 3 weeks ago. She had a normal [place] on 07/02/15. She denies vaginal bleeding, LOF, UTI symptoms, fever or recent intercourse. She does endorse a yellow discharge that has associated itching. She states occasional mild nausea without vomiting, diarrhea or constipation.   OB History    Gravida Para Term Preterm AB TAB SAB Ectopic Multiple Living   4 3 2 1  0 0 0 0 0 3      Past Medical History  Diagnosis Date  . Asthma   . Asthma   . Preterm labor     1st pregnancy d/t pre-e  . Pregnancy induced hypertension     previous pregnancy  . Pyelonephritis     June 2013  . Varicose veins   . Vaginal pessary in situ     Past Surgical History  Procedure Laterality Date  . No past surgeries      Family History  Problem Relation Age of Onset  . Anesthesia problems Neg Hx   . Hypotension Neg Hx   . Malignant hyperthermia Neg Hx   . Pseudochol deficiency Neg Hx   . Other Neg Hx   . Diabetes Mother   . Asthma Father     History  Substance Use Topics  . Smoking status: Former Games developer  . Smokeless tobacco: Never Used     Comment: yrs ago  . Alcohol Use: No    Allergies:  Allergies  Allergen Reactions  . Iohexol Hives and Shortness Of Breath     Code: SOB, Desc: IMMEDIATELY SOB FOLLOWING IV INJECTION, SPOTTY HIVES, PT GIVEN BENADRYL AND EPI-ARS 08/22/09, Onset Date: [id number]     Prescriptions prior to admission  Medication Sig Dispense Refill Last Dose  . acetaminophen (TYLENOL) 500 MG tablet Take 2  tablets (1,000 mg total) by mouth every 6 (six) hours as needed for moderate pain. 30 tablet 0 07/08/2015 at Unknown time  . Prenatal Vit-Fe Fumarate-FA (PRENATAL MULTIVITAMIN) TABS tablet Take 1 tablet by mouth daily at 12 noon.   07/07/2015 at Unknown time  . albuterol (PROVENTIL HFA;VENTOLIN HFA) 108 (90 BASE) MCG/ACT inhaler Inhale 2 puffs into the lungs every 6 (six) hours as needed for wheezing or shortness of breath. 1 Inhaler 0 More than a month at Unknown time  . butalbital-acetaminophen-caffeine (FIORICET) 50-325-40 MG per tablet Take 1-2 tablets by mouth every 6 (six) hours as needed for headache. 20 tablet 0 More than a month at Unknown time    Review of Systems  Constitutional: Positive for chills. Negative for fever and malaise/fatigue.  Gastrointestinal: Positive for nausea and abdominal pain. Negative for vomiting, diarrhea and constipation.  Genitourinary: Negative for dysuria, urgency and frequency.       Neg - vaginal bleeding, LOF + vaginal discharge   Physical Exam   Blood pressure 100/64, pulse 74, temperature 97.6 F (36.4 C), temperature source Oral, resp. rate 20, height 5\' 1"  (1.549 m), weight 142 lb 2 oz (64.467 kg), last menstrual period 02/19/2015, SpO2  100 %, not currently breastfeeding.  Physical Exam  Nursing note and vitals reviewed. Constitutional: She is oriented to person, place, and time. She appears well-developed and well-nourished. No distress.  HENT:  Head: Normocephalic and atraumatic.  Cardiovascular: Normal rate.   Respiratory: Effort normal.  GI: Soft. She exhibits no distension and no mass. There is no tenderness. There is no rebound and no guarding.  Genitourinary: Uterus is enlarged (apporpriate for GA). Uterus is not tender. Cervix exhibits no motion tenderness, no discharge and no friability. No bleeding in the vagina. Vaginal discharge (moderate amount of thick, clumpy discharge noted) found.  Neurological: She is alert and oriented to  person, place, and time.  Skin: Skin is warm and dry. No erythema.  Psychiatric: She has a normal mood and affect.  Dilation: Closed Effacement (%): Thick Cervical Position: Posterior Exam by:: Harlon Flor, PA-C  Results for orders placed or performed during the hospital encounter of 07/08/15 (from the past 24 hour(s))  Urinalysis, Routine w reflex microscopic (not at [facility])     Status: Abnormal   Collection Time: 07/08/15 12:51 AM  Result Value Ref Range   Color, Urine YELLOW YELLOW   APPearance CLEAR CLEAR   Specific Gravity, Urine 1.025 1.005 - 1.030   pH 5.5 5.0 - 8.0   Glucose, UA NEGATIVE NEGATIVE mg/dL   Hgb urine dipstick TRACE (A) NEGATIVE   Bilirubin Urine NEGATIVE NEGATIVE   Ketones, ur NEGATIVE NEGATIVE mg/dL   Protein, ur NEGATIVE NEGATIVE mg/dL   Urobilinogen, UA 0.2 0.0 - 1.0 mg/dL   Nitrite NEGATIVE NEGATIVE   Leukocytes, UA SMALL (A) NEGATIVE  Urine microscopic-add on     Status: None   Collection Time: 07/08/15 12:51 AM  Result Value Ref Range   Squamous Epithelial / LPF RARE RARE   WBC, UA 0-2 <3 WBC/hpf   RBC / HPF 0-2 <3 RBC/hpf   Bacteria, UA RARE RARE  Wet prep, genital     Status: Abnormal   Collection Time: 07/08/15  1:20 AM  Result Value Ref Range   Yeast Wet Prep HPF POC FEW (A) NONE SEEN   Trich, Wet Prep NONE SEEN NONE SEEN   Clue Cells Wet Prep HPF POC RARE (A) NONE SEEN   WBC, Wet Prep HPF POC MODERATE (A) NONE SEEN    MAU Course  Procedures None  MDM + FHR m- 158 bpm with doppler UA and wet prep today Urine culture pending Flexeril and Diflucan given in MAU Patient reports significant improvement in pain Assessment and Plan  A: SIUP at [redacted]w[redacted]d Yeast vulvovaginitis Round ligament pain  P: Discharge home Rx for Flexeril given to patient Second trimester precautions discussed Patient advised to follow-up with [facility] as scheduled for routine prenatal care Patient may return to MAU as needed or if her condition were to change or  worsen  Marny Lowenstein, PA-C  07/08/2015, 2:01 AM

## 2015-07-08 NOTE — Discharge Instructions (Signed)
Vulvovaginitis Candidiásica °(Candidal Vulvovaginitis) °La vulvovaginitis candidiásica es una infección de la vagina y la vulva. La vulva es la piel que rodea la abertura de la vagina. Puede causar picazón y molestias dentro de y alrededor de la vagina.  °CUIDADOS EN EL HOGAR °· Sólo tome medicamentos como lo indique su médico. °· No mantenga relaciones sexuales hasta que la infección haya curado o según le indique el médico. °· Practique sexo seguro. °· Informe a su compañero sexual acerca de su infección. °· No tome duchas vaginales ni use tampones. °· Use ropa interior de algodón. No utilice pantalones ni pantimedias ajustados. °· Coma yogur. Esto puede ayudar a tratar y prevenir las infecciones por cándida. °SOLICITE AYUDA DE INMEDIATO SI:  °· Tiene fiebre. °· Los problemas empeoran durante el tratamiento, o si no mejora luego de 3 días. °· Tiene malestar, irritación, o picazón en la zona de la vagina o la vulva. °· Siente dolor en al mantener relaciones sexuales. °· Comienza a sentir dolor abdominal. °ASEGÚRESE DE QUE: °· Comprende estas instrucciones. °· Controlará su enfermedad. °· Solicitará ayuda de inmediato si no mejora o empeora. °Document Released: 12/31/2010 Document Revised: 12/03/2013 °ExitCare® Patient Information ©2015 [employer]. This information is not intended to replace advice given to you by your health care provider. Make sure you discuss any questions you have with your health care provider. ° °

## 2015-07-08 NOTE — MAU Note (Signed)
PT  SAYS S WITH INTERPRETER  - DEBBIE --     SHE  STARTED  HAVING  PAIN   IN LOWER  ABD   AT  9PM.  HAS BEEN GOING  ON X3 WEEKS  AFTER A  FALL AT SWIMMING  POOL.     TOOK  TYLENOL       AT 9 .-    WITH  SOME RELIEF.    TONIGHT  SHE STARTED  FEELING  SHAKEY  AND SOB  AT 9PM     AFTER FALL- DID NOT HAVE ASSESSMENT - WENT   HOME.    [facility]- CLINIC.     NO VAG BLEEDING.  LAST  SEX-  LAST WEEK.

## 2015-07-08 NOTE — MAU Note (Signed)
Pt also c/o vaginal itching.  

## 2015-08-14 ENCOUNTER — Emergency Department (HOSPITAL_COMMUNITY)
Admission: EM | Admit: 2015-08-14 | Discharge: 2015-08-14 | Disposition: A | Discharge status: Other Acute Inpatient Hospital | Payer: Self-pay | Attending: Obstetrics and Gynecology | Admitting: Obstetrics and Gynecology

## 2015-08-14 ENCOUNTER — Encounter (HOSPITAL_COMMUNITY): Payer: Self-pay | Admitting: Family Medicine

## 2015-08-14 DIAGNOSIS — Z792 Long term (current) use of antibiotics: Secondary | ICD-10-CM | POA: Insufficient documentation

## 2015-08-14 DIAGNOSIS — O2312 Infections of bladder in pregnancy, second trimester: Secondary | ICD-10-CM

## 2015-08-14 DIAGNOSIS — Z87891 Personal history of nicotine dependence: Secondary | ICD-10-CM | POA: Insufficient documentation

## 2015-08-14 DIAGNOSIS — O9989 Other specified diseases and conditions complicating pregnancy, childbirth and the puerperium: Principal | ICD-10-CM | POA: Insufficient documentation

## 2015-08-14 DIAGNOSIS — R000 Tachycardia, unspecified: Secondary | ICD-10-CM | POA: Insufficient documentation

## 2015-08-14 DIAGNOSIS — R1030 Lower abdominal pain, unspecified: Secondary | ICD-10-CM

## 2015-08-14 DIAGNOSIS — Z3A25 25 weeks gestation of pregnancy: Secondary | ICD-10-CM | POA: Insufficient documentation

## 2015-08-14 DIAGNOSIS — O4702 False labor before 37 completed weeks of gestation, second trimester: Secondary | ICD-10-CM

## 2015-08-14 DIAGNOSIS — N3090 Cystitis, unspecified without hematuria: Secondary | ICD-10-CM | POA: Insufficient documentation

## 2015-08-14 DIAGNOSIS — J45909 Unspecified asthma, uncomplicated: Secondary | ICD-10-CM | POA: Insufficient documentation

## 2015-08-14 DIAGNOSIS — O99512 Diseases of the respiratory system complicating pregnancy, second trimester: Secondary | ICD-10-CM | POA: Insufficient documentation

## 2015-08-14 DIAGNOSIS — Z8679 Personal history of other diseases of the circulatory system: Secondary | ICD-10-CM | POA: Insufficient documentation

## 2015-08-14 DIAGNOSIS — Z3490 Encounter for supervision of normal pregnancy, unspecified, unspecified trimester: Secondary | ICD-10-CM

## 2015-08-14 LAB — URINALYSIS, ROUTINE W REFLEX MICROSCOPIC
Bilirubin Urine: NEGATIVE
Glucose, UA: 100 mg/dL — AB
Ketones, ur: 15 mg/dL — AB
Leukocytes, UA: NEGATIVE
Nitrite: NEGATIVE
PROTEIN: NEGATIVE mg/dL
Specific Gravity, Urine: 1.029 (ref 1.005–1.030)
UROBILINOGEN UA: 1 mg/dL (ref 0.0–1.0)
pH: 6 (ref 5.0–8.0)

## 2015-08-14 LAB — WET PREP, GENITAL
Clue Cells Wet Prep HPF POC: NONE SEEN
Trich, Wet Prep: NONE SEEN
Yeast Wet Prep HPF POC: NONE SEEN

## 2015-08-14 LAB — URINE MICROSCOPIC-ADD ON

## 2015-08-14 LAB — AMNISURE RUPTURE OF MEMBRANE (ROM) NOT AT ARMC: AMNISURE: NEGATIVE

## 2015-08-14 MED ORDER — CEPHALEXIN 500 MG PO CAPS: 500.0000 mg | ORAL_CAPSULE | Freq: Four times a day (QID) | ORAL | Status: DC

## 2015-08-14 MED ORDER — LACTATED RINGERS IV BOLUS (SEPSIS)
1000.0000 mL | Freq: Once | INTRAVENOUS | Status: AC
  Administered 2015-08-14: 1000 mL via INTRAVENOUS

## 2015-08-14 MED ORDER — SODIUM CHLORIDE 0.9 % IV BOLUS (SEPSIS)
500.0000 mL | Freq: Once | INTRAVENOUS | Status: AC
  Administered 2015-08-14: 500 mL via INTRAVENOUS

## 2015-08-14 MED ORDER — ACETAMINOPHEN 325 MG PO TABS
650.0000 mg | ORAL_TABLET | Freq: Once | ORAL | Status: AC
  Administered 2015-08-14: 650 mg via ORAL
  Filled 2015-08-14: qty 2

## 2015-08-14 MED ORDER — SODIUM CHLORIDE 0.9 % IV BOLUS (SEPSIS): 500.0000 mL | Freq: Once | INTRAVENOUS | Status: DC

## 2015-08-14 MED ORDER — CEPHALEXIN 500 MG PO CAPS
500.0000 mg | ORAL_CAPSULE | Freq: Four times a day (QID) | ORAL | Status: DC
  Administered 2015-08-14: 500 mg via ORAL
  Filled 2015-08-14 (×3): qty 1

## 2015-08-14 NOTE — ED Provider Notes (Signed)
I saw and evaluated the patient, reviewed the resident's note and I agree with the findings and plan.   EKG Interpretation None      34 yo female with lower abdominal cramping pain, subjective fevers, and dysuria.  She is approximately [redacted] weeks pregnant.  On exam, well appearing, nontoxic, not distressed, normal respiratory effort, normal perfusion, abdomen soft, gravid, mildly tender in suprapubic region.  She appears to be having contractions on the monitor.  She reports a gush of clear fluid this morning. Have discussed with Dr. Emelda Fear, who will accept her to the MAU for further evaluation.    Clinical Impression: 1. Pregnant   2. Lower abdominal pain      Blake Divine, MD 08/14/15 1735

## 2015-08-14 NOTE — ED Provider Notes (Signed)
CSN: [id number]     Arrival date & time 08/14/15  1554 History   First MD Initiated Contact with Patient 08/14/15 1617     Chief Complaint  Patient presents with  . Abdominal Pain    pregnant   Patient is a 34 y.o. female presenting with general illness. The history is limited by a language barrier. No language interpreter was used.  Illness Location:  Lower abdomen Quality:  Pain Severity:  Moderate Onset quality:  Gradual Timing:  Constant Progression:  Worsening Chronicity:  Recurrent Context:  G4P2103 at 25 weeks 1 day presenting with lower abdominal pain. Patient notes subjective fever, chills, and dysuria over past 2 days. Yesterday evening at his lower abdominal pain which has been persistent until presentation this evening. She also notes large gush of clear fluid from her vagina this morning and has not been feeling her baby move as frequently today. Patient denies vaginal bleeding. Associated symptoms: abdominal pain and fever (subjective)   Associated symptoms: no chest pain, no cough, no diarrhea, no loss of consciousness, no nausea, no shortness of breath and no vomiting     Past Medical History  Diagnosis Date  . Asthma   . Asthma   . Preterm labor     1st pregnancy d/t pre-e  . Pregnancy induced hypertension     previous pregnancy  . Pyelonephritis     June 2013  . Varicose veins   . Vaginal pessary in situ    Past Surgical History  Procedure Laterality Date  . No past surgeries     Family History  Problem Relation Age of Onset  . Anesthesia problems Neg Hx   . Hypotension Neg Hx   . Malignant hyperthermia Neg Hx   . Pseudochol deficiency Neg Hx   . Other Neg Hx   . Diabetes Mother   . Asthma Father    Social History  Substance Use Topics  . Smoking status: Former Games developer  . Smokeless tobacco: Never Used     Comment: yrs ago  . Alcohol Use: No   OB History    Gravida Para Term Preterm AB TAB SAB Ectopic Multiple Living   0 0 0 0 0 3       Review of Systems  Constitutional: Positive for fever (subjective) and chills.  Respiratory: Negative for cough and shortness of breath.   Cardiovascular: Negative for chest pain.  Gastrointestinal: Positive for abdominal pain. Negative for nausea, vomiting and diarrhea.  Genitourinary: Positive for dysuria and vaginal discharge (large gush of clear fluid). Negative for vaginal bleeding.  Neurological: Negative for loss of consciousness.  All other systems reviewed and are negative.   Allergies  Iohexol  Home Medications   Prior to Admission medications   Medication Sig Start Date End Date Taking? Authorizing Provider  acetaminophen (TYLENOL) 500 MG tablet Take 500 mg by mouth every 6 (six) hours as needed for mild pain.   Yes Historical Provider, MD  albuterol (PROVENTIL HFA;VENTOLIN HFA) 108 (90 BASE) MCG/ACT inhaler Inhale 2 puffs into the lungs every 6 (six) hours as needed for wheezing or shortness of breath. Patient not taking: Reported on 08/14/2015 04/28/15   Joanna Puff, MD  cephALEXin (KEFLEX) 500 MG capsule Take 1 capsule (500 mg total) by mouth every 6 (six) hours. 08/14/15   Arabella Merles, CNM   BP 110/60 mmHg  Pulse 70  Temp(Src) 97.9 F (36.6 C) (Axillary)  Resp 16  SpO2 99%  LMP 02/19/2015 (  Approximate)   Physical Exam  Constitutional: She is oriented to person, place, and time. She appears well-developed and well-nourished. She appears distressed.  HENT:  Head: Normocephalic and atraumatic.  Eyes: Conjunctivae are normal. Pupils are equal, round, and reactive to light.  Neck: Normal range of motion. Neck supple.  Cardiovascular: Intact distal pulses.  Tachycardia present.   Pulmonary/Chest: Effort normal and breath sounds normal.  Abdominal: There is tenderness (mild to suprabpubic region).  Abdomen gravid and mild tenderness to palpation of lower abdomen.  Musculoskeletal: Normal range of motion.  Neurological: She is alert and oriented to person,  place, and time.  Skin: Skin is warm and dry. She is not diaphoretic.    ED Course  Procedures   Labs Review Labs Reviewed  WET PREP, GENITAL - Abnormal; Notable for the following:    WBC, Wet Prep HPF POC FEW (*)    All other components within normal limits  URINALYSIS, ROUTINE W REFLEX MICROSCOPIC (NOT AT [facility]) - Abnormal; Notable for the following:    Glucose, UA 100 (*)    Hgb urine dipstick MODERATE (*)    Ketones, ur 15 (*)    All other components within normal limits  URINE MICROSCOPIC-ADD ON - Abnormal; Notable for the following:    Bacteria, UA MANY (*)    All other components within normal limits  AMNISURE RUPTURE OF MEMBRANE (ROM) NOT AT [facility]  GC/CHLAMYDIA PROBE AMP ([place]) NOT AT [facility]   Imaging Review No results found. I have personally reviewed and evaluated these images and lab results as part of my medical decision-making.   EKG Interpretation None      MDM  Mrs. Cathy Nash is a 34 yo female G4P2103 at 25 weeks 1 day presenting with lower abdominal pain. Patient notes subjective fever, chills, and dysuria over past 2 days. Yesterday evening at his lower intermittent abdominal cramping which has been persistent until presentation this evening. She also notes gush of clear fluid from her vagina this morning but none since. Pt has continued to feel her baby move today but not as frequently. Patient denies vaginal bleeding.  Exam above notable for a young female lying in stretcher in mild distress. Afebrile. Tachycardia to low 100s. Normotensive. Patient is breathing on room air and maintaining saturations without supple little option. Abdomen gravid and mild tenderness to palpation of lower abdomen.   Bedside ultrasound performed and showing fetus with regular movement, AFI normal, and heart rate WNL. Rapid response gynecology nurse presented to the emergency department and proceeded with evaluation - recommending UA as well as IVF's. Obstetrician on call contacted  and recommending transfer of patient to MAU for further evaluation and management. EMTALA form filled out. Patient understands and agrees with the plan and has no further questions or concerns at this time.  Patient care discussed with followed by my attending, Dr. Blake Divine.  Final diagnoses:  Pregnant  Lower abdominal pain  Cystitis of pregnancy in second trimester    Angelina Ok, MD 08/15/15 8119  Blake Divine, MD 08/16/15 2008

## 2015-08-14 NOTE — ED Notes (Signed)
Pt here for lower abd pain and back pain since last night. Pt is 6 months pregnant. sts jello like discharge. sts pain is constant.

## 2015-08-14 NOTE — MAU Provider Note (Signed)
History     CSN: [id number]  Arrival date and time: 08/14/15 1554  First Provider Initiated Contact with Patient 08/14/15 1848     Chief Complaint  Patient presents with  . Abdominal Pain    pregnant   HPI Patient is 34 y.o. [id number] [redacted]w[redacted]d here with complaints of contractions and possible ROM. Reports pelvic pain and back pain starting yesterday that is worsening. No LOF. Reports white discharge, odor, denies bleeding spotting. Denies vaginitis. Reports dysuria starting yesterday, reports polyuria.  Reports kidney infection in 2013. This feels similar but less than this episodes. No fever. Report chills starting last night. Denies nausea/vomiting.  Pain in back is low, not under ribs.  Reports pelvic pain feels 'kinda like contractions'  +FM, denies LOF, VB, contractions, vaginal discharge.   OB History    Gravida Para Term Preterm AB TAB SAB Ectopic Multiple Living   0 0 0 0 0 3      Past Medical History  Diagnosis Date  . Asthma   . Asthma   . Preterm labor     1st pregnancy d/t pre-e  . Pregnancy induced hypertension     previous pregnancy  . Pyelonephritis     June 2013  . Varicose veins   . Vaginal pessary in situ     Past Surgical History  Procedure Laterality Date  . No past surgeries      Family History  Problem Relation Age of Onset  . Anesthesia problems Neg Hx   . Hypotension Neg Hx   . Malignant hyperthermia Neg Hx   . Pseudochol deficiency Neg Hx   . Other Neg Hx   . Diabetes Mother   . Asthma Father     Social History  Substance Use Topics  . Smoking status: Former Games developer  . Smokeless tobacco: Never Used     Comment: yrs ago  . Alcohol Use: No    Allergies:  Allergies  Allergen Reactions  . Iohexol Hives and Shortness Of Breath     Code: SOB, Desc: IMMEDIATELY SOB FOLLOWING IV INJECTION, SPOTTY HIVES, PT GIVEN BENADRYL AND EPI-ARS 08/22/09, Onset Date: [id number]     Prescriptions prior to admission  Medication Sig Dispense  Refill Last Dose  . acetaminophen (TYLENOL) 500 MG tablet Take 500 mg by mouth every 6 (six) hours as needed for mild pain.   08/13/2015 at Unknown time  . acetaminophen (TYLENOL) 500 MG tablet Take 2 tablets (1,000 mg total) by mouth every 6 (six) hours as needed for moderate pain. (Patient not taking: Reported on 08/14/2015) 30 tablet 0 Not Taking at Unknown time  . albuterol (PROVENTIL HFA;VENTOLIN HFA) 108 (90 BASE) MCG/ACT inhaler Inhale 2 puffs into the lungs every 6 (six) hours as needed for wheezing or shortness of breath. (Patient not taking: Reported on 08/14/2015) 1 Inhaler 0 Not Taking at Unknown time  . butalbital-acetaminophen-caffeine (FIORICET) 50-325-40 MG per tablet Take 1-2 tablets by mouth every 6 (six) hours as needed for headache. (Patient not taking: Reported on 08/14/2015) 20 tablet 0 Not Taking at Unknown time  . cyclobenzaprine (FLEXERIL) 10 MG tablet Take 1 tablet (10 mg total) by mouth 2 (two) times daily as needed for muscle spasms. (Patient not taking: Reported on 08/14/2015) 20 tablet 0 Not Taking at Unknown time    Review of Systems  Constitutional: Negative for fever and chills.  Eyes: Negative for blurred vision and double vision.  Respiratory: Negative for cough and shortness of breath.  Cardiovascular: Negative for chest pain and orthopnea.  Gastrointestinal: Negative for nausea and vomiting.  Genitourinary: Negative for dysuria, frequency and flank pain.  Musculoskeletal: Negative for myalgias.  Skin: Negative for rash.  Neurological: Negative for dizziness, tingling, weakness and headaches.  Endo/Heme/Allergies: Does not bruise/bleed easily.  Psychiatric/Behavioral: Negative for depression and suicidal ideas. The patient is not nervous/anxious.    Physical Exam   Blood pressure 106/55, pulse 83, temperature 98 F (36.7 C), temperature source Oral, resp. rate 18, last menstrual period 02/19/2015, SpO2 99 %, not currently breastfeeding.  Physical Exam   Constitutional: She is oriented to person, place, and time. She appears well-developed and well-nourished. No distress.  Pregnant female  HENT:  Head: Normocephalic and atraumatic.  Eyes: Conjunctivae are normal. No scleral icterus.  Neck: Normal range of motion. Neck supple.  Cardiovascular: Normal rate and intact distal pulses.   Respiratory: Effort normal. She exhibits no tenderness.  GI: Soft. There is no tenderness. There is no rebound and no guarding.  Gravid  Genitourinary: Vagina normal.  Musculoskeletal: Normal range of motion. She exhibits no edema.  Neurological: She is alert and oriented to person, place, and time. Coordination normal.  Skin: Skin is warm and dry. No rash noted.  Psychiatric: She has a normal mood and affect.   Dilation: Fingertip Effacement (%): Thick Cervical Position: Middle Station: Ballotable Presentation: Undeterminable Exam by:: Erin Hampton,RNC   MAU Course  Procedures  MDM 1L LR UA- Moderate HGB, +blood, +keytones, +glucose- with reflex cx Wet prep- Negative  Amnisure- negative GC/CT- pending  NST-145/mod/+accels (10x10), deceleration Toco- quiet  Assessment and Plan  Cathy Nash is a 34 y.o. [id number] at [redacted]w[redacted]d presenting with concern for preterm labor  #Concern for PTL/PPROM  #Acute Cystitis   Federico Flake 08/14/2015, 6:48 PM   Plan: Rev'd result of neg amnisure and UA. Rx Keflex 500 q 6hrs x 7d #28. Urine to culture. Instructed in increasing fluids; call for fever or increasing dysuria/back pain. Also instructed to call clinic to schedule next OB appt.  Pt and partner voice understanding.  Cam Hai CNM 08/14/2015 9:41 PM

## 2015-08-14 NOTE — Progress Notes (Addendum)
Arrived to evaluate this 34 yo G4P3 @ 25.[redacted]wks GA in with complaint of abdominal pain, urinary burning, and vaginal discharge.  Denies vaginal bleeding.  Reports good fetal movement.  FHR Category I for [redacted] wk GA.  UI tracing, UC's suspected as abdominal pain is intermittent Q2-59min.  IVF bolus recommended and ordered by EDP.  SVE dimple/ thick/ mid position/ soft.  1725  Dr. Emelda Fear notified of above and of obstetrical history.  Advises transfer to MAU for further observation and possible amnisure testing.

## 2015-08-14 NOTE — MAU Note (Signed)
Pt arrived via [facility] from [facility] for back and abdominal pain and burning with urination since last night.

## 2015-08-14 NOTE — ED Notes (Signed)
This note also relates to the following rows which could not be included: BP - Cannot attach notes to unvalidated device data Pulse Rate - Cannot attach notes to unvalidated device data Resp - Cannot attach notes to unvalidated device data SpO2 - Cannot attach notes to unvalidated device data   Report to Lucy Chris, RN MAU

## 2015-08-14 NOTE — Discharge Instructions (Signed)

## 2015-08-18 LAB — GC/CHLAMYDIA PROBE AMP (~~LOC~~) NOT AT ARMC
Chlamydia: NEGATIVE
Neisseria Gonorrhea: NEGATIVE

## 2015-08-31 ENCOUNTER — Ambulatory Visit (INDEPENDENT_AMBULATORY_CARE_PROVIDER_SITE_OTHER): Payer: Self-pay | Admitting: Family Medicine

## 2015-08-31 VITALS — BP 97/58 | HR 74 | Temp 98.5°F | Wt 147.5 lb

## 2015-08-31 DIAGNOSIS — Z3493 Encounter for supervision of normal pregnancy, unspecified, third trimester: Secondary | ICD-10-CM

## 2015-08-31 DIAGNOSIS — O09213 Supervision of pregnancy with history of pre-term labor, third trimester: Secondary | ICD-10-CM

## 2015-08-31 DIAGNOSIS — Z23 Encounter for immunization: Secondary | ICD-10-CM

## 2015-08-31 DIAGNOSIS — O09899 Supervision of other high risk pregnancies, unspecified trimester: Secondary | ICD-10-CM | POA: Insufficient documentation

## 2015-08-31 DIAGNOSIS — O09893 Supervision of other high risk pregnancies, third trimester: Secondary | ICD-10-CM

## 2015-08-31 LAB — POCT URINALYSIS DIP (DEVICE)
Bilirubin Urine: NEGATIVE
GLUCOSE, UA: NEGATIVE mg/dL
Ketones, ur: NEGATIVE mg/dL
NITRITE: NEGATIVE
Protein, ur: NEGATIVE mg/dL
Specific Gravity, Urine: 1.01 (ref 1.005–1.030)
UROBILINOGEN UA: 0.2 mg/dL (ref 0.0–1.0)
pH: 6 (ref 5.0–8.0)

## 2015-08-31 LAB — CBC
HCT: 30.7 % — ABNORMAL LOW (ref 36.0–46.0)
Hemoglobin: 10.4 g/dL — ABNORMAL LOW (ref 12.0–15.0)
MCH: 30.2 pg (ref 26.0–34.0)
MCHC: 33.9 g/dL (ref 30.0–36.0)
MCV: 89.2 fL (ref 78.0–100.0)
MPV: 11.2 fL (ref 8.6–12.4)
PLATELETS: 304 10*3/uL (ref 150–400)
RBC: 3.44 MIL/uL — AB (ref 3.87–5.11)
RDW: 13.3 % (ref 11.5–15.5)
WBC: 6 10*3/uL (ref 4.0–10.5)

## 2015-08-31 MED ORDER — TETANUS-DIPHTH-ACELL PERTUSSIS 5-2.5-18.5 LF-MCG/0.5 IM SUSP
0.5000 mL | Freq: Once | INTRAMUSCULAR | Status: AC
  Administered 2015-08-31: 0.5 mL via INTRAMUSCULAR

## 2015-08-31 NOTE — Progress Notes (Signed)
1 hour glucose test today, due at 12:08 Pt reports pelvic pain and irregular contractions.

## 2015-08-31 NOTE — Progress Notes (Signed)
Subjective:  Cathy Nash is a 34 y.o. [phone] at [redacted]w[redacted]d being seen today for ongoing prenatal care.  Patient reports headache, nausea, occasional contractions and leaking fluid.  Contractions: Irregular.  Vag. Bleeding: None. Movement: Present.   Complaining of loss of fluid starting last night. Not a big gush of fluid. Reports only drops.  Cramping for 3 days- intermittent will happen every couple hours, reports only one intense cramp.  HA- alleviated by tylenol Nausea- not severe, was using medication but then ran out. Does not feel she needs medication any more as this nausea is not that bad  The following portions of the patient's history were reviewed and updated as appropriate: allergies, current medications, past family history, past medical history, past social history, past surgical history and problem list.   Objective:   Filed Vitals:   08/31/15 1105  BP: 97/58  Pulse: 74  Temp: 98.5 F (36.9 C)  Weight: 147 lb 8 oz (66.906 kg)    Fetal Status: Fetal Heart Rate (bpm): 144   Movement: Present     General:  Alert, oriented and cooperative. Patient is in no acute distress.  Skin: Skin is warm and dry. No rash noted.   Cardiovascular: Normal heart rate noted  Respiratory: Normal respiratory effort, no problems with respiration noted  Abdomen: Soft, gravid, appropriate for gestational age. Pain/Pressure: Present     Pelvic: Vag. Bleeding: None Vag D/C Character: White   Cervical exam performed Dilation: Closed Effacement (%): Thick Station: Ballotable. No pooling in posterior fornix/vaginal vault.   Extremities: Normal range of motion.  Edema: Trace  Mental Status: Normal mood and affect. Normal behavior. Normal judgment and thought content.   Urinalysis: Urine Protein: Negative Urine Glucose: Negative  Assessment and Plan:  Pregnancy: [id number] at [redacted]w[redacted]d  1. Supervision of low-risk pregnancy, third trimester - updated pregnancy box - 1 hr GTT - Tdap and Flu  today  2. H/O preterm delivery, currently pregnant, third trimester [place] on 7/21 showed cervical length 3cm  3. Concern for PPROM or PTL, Leaking Fluid - Fern test negative, pooling negative - Cervix is closed/th/high - Not having regular ctx.  - most likely normal discharge, does not appear infectious -Reviewed returning to ED for further fluid leaking and would recommend amniosure at that time  Preterm labor symptoms and general obstetric precautions including but not limited to vaginal bleeding, contractions, leaking of fluid and fetal movement were reviewed in detail with the patient. Please refer to After Visit Summary for other counseling recommendations.   Return in about 4 weeks (around 09/28/2015) for Routine prenatal care.   Federico Flake, MD

## 2015-08-31 NOTE — Progress Notes (Signed)
Breastfeeding tip of the week reviewed Pt reports abdominal pain for 3 days.  Pt states she is unable to ambulate or stand for long periods of time Hgb: small, leukocytes: small

## 2015-08-31 NOTE — Patient Instructions (Addendum)
Belly Band (banda de compression)- puede comprarla en [employer] [place] Baby's R [place]     Venas varicosas (Varicose Veins) Las venas varicosas son venas que se han agrandado y se tornan sinuosas.  CAUSAS  Este trastorno es el resultado de un malfuncionamiento de las vlvulas de las venas. Las vlvulas de las venas ayudan al retorno de la sangre desde las piernas hacia el corazn. Si estas vlvulas se daan, el flujo sanguneo retorna hacia atrs y [employer] venas de la pierna, cerca de la piel. Esto hace que las venas se agranden debido al aumento de la presin en su interior. Es ms probable que desarrollen venas varicosas las personas que estn de pie durante mucho [place], las mujeres embarazadas o quienes tienen sobrepeso. SNTOMAS   Venas hinchadas, tortuosas, y [place], que se observan ms comnmente en las piernas.  Dolor en las piernas o sensacin de pesadez. Estos sntomas pueden empeorar hacia el final del da.  Hinchazn de las piernas.  Cambios en el color de la piel. DIAGNSTICO  Su mdico puede diagnosticarle venas varicosas con un examen de las piernas. Podr recomendarle una ecografa de las venas de sus piernas.  TRATAMIENTO  La mayora de las venas varicosas pueden tratarse en casa.Sin embargo, existen otros tratamientos para personas con sntomas persistentes o que desean tratar la apariencia de las venas varicosas. Entre estos tratamientos se incluyen:   Corporate treasurer de venas varicosas muy pequeas.  Medicamentos que se inyectan en la vena. Este medicamento endurece las paredes de la vena y Administrator, sports. Se denomina escleroterapia. Luego, deber [employer] ropa o vendajes que apliquen presin.  Ciruga. INSTRUCCIONES PARA EL CUIDADO DOMICILIARIO  No permanezca sentado o de pie en una posicin durante largos perodos. No se siente con las piernas cruzadas. Descanse con las piernas Conservator, museum/gallery.  Use medias elsticas o de soporte. No use prendas  apretadas alrededor de las piernas, pelvis o cintura.  Camine todo lo posible para aumentar el flujo de sangre.  Levante los pies de la cama por la noche, alrededor de 5 cm.  Si se ha cortado la piel que est sobre la vena y [place], recustese con la pierna elevada y presione con un pao limpio hasta que la sangre se [place]. Luego coloque una venda sobre el corte. Consulte a su mdico si contina sangrando o necesita una sutura. SOLICITE ATENCIN MDICA SI:  La piel que rodea el tobillo comienza a lesionarse.  Presenta dolor, enrojecimiento, sensibilidad o hinchazn en la pierna, sobre la vena.  Siente molestias y Radiographer, therapeutic pierna. Document Released: 09/07/2005 Document Revised: 02/20/2012 [facility] Patient Information 2015 [place], [place]. This information is not intended to replace advice given to you by your health care provider. Make sure you discuss any questions you have with your health care provider.   Segundo trimestre de Psychiatrist (Second Trimester of Pregnancy) El segundo trimestre va desde la semana13 hasta la 28, desde el cuarto hasta el sexto mes, y suele ser el momento en el que mejor se siente. Su organismo se ha adaptado a Charity fundraiser y comienza a Diplomatic Services operational officer. En general, las nuseas matutinas han disminuido o han desaparecido completamente, p El segundo trimestre es tambin la poca en la que el feto se desarrolla rpidamente. Hacia el final del sexto mes, el feto mide aproximadamente 9pulgadas (23cm) y pesa alrededor de 1 libras (700g). Es probable que sienta que el beb se Teacher, English as a foreign language (da pataditas) entre las 18 y 20semanas del Psychiatrist. CAMBIOS EN  EL ORGANISMO Su organismo atraviesa por muchos cambios durante el [place], y estos varan de Neomia Dear mujer a Educational psychologist.   Seguir [employer] Companies. Notar que la parte baja del abdomen sobresale.  Podrn aparecer las primeras Albertson's caderas, el abdomen y las [place].  Es posible que tenga dolores de  cabeza que pueden aliviarse con los medicamentos que su mdico autorice.  Tal vez tenga necesidad de orinar con ms frecuencia porque el feto est ejerciendo presin Ambulance person.  Debido al Vanetta Mulders podr sentir Anthoney Harada estomacal con frecuencia.  Puede estar estreida, ya que ciertas hormonas enlentecen los movimientos de los msculos que [employer] desechos a travs de los intestinos.  Pueden aparecer hemorroides o abultarse e hincharse las venas (venas varicosas).  Puede tener dolor de espalda que se debe al Citigroup de peso y a que las hormonas del Management consultant las articulaciones entre los huesos de la pelvis, y Public librarian consecuencia de la modificacin del peso y los msculos que mantienen el equilibrio.  Las [employer] seguirn creciendo y Development worker, community.  Las Veterinary surgeon y estar sensibles al cepillado y al hilo dental.  Pueden aparecer zonas oscuras o manchas (cloasma, mscara del Psychiatrist) en el rostro que probablemente se atenuarn despus del nacimiento del beb.  Es posible que se forme una lnea oscura desde el ombligo hasta la zona del pubis (linea nigra) que probablemente se atenuarn despus del nacimiento del beb.  Tal vez haya cambios en el cabello que pueden incluir su engrosamiento, crecimiento rpido y cambios en la textura. Adems, a algunas mujeres se les cae el cabello durante o despus del embarazo, o tienen el cabello seco o fino. Lo ms probable es que el cabello se le normalice despus del nacimiento del beb. QU DEBE ESPERAR EN LAS CONSULTAS PRENATALES Durante una visita prenatal de rutina:  La pesarn para asegurarse de que usted y el feto estn creciendo normalmente.  Le tomarn la presin arterial.  Le medirn el abdomen para controlar el desarrollo del beb.  Se escucharn los latidos cardacos fetales.  Se evaluarn los resultados de los estudios solicitados en visitas anteriores. El mdico puede preguntarle lo siguiente:  Cmo se siente.  Si  siente los movimientos del beb.  Si ha tenido sntomas anormales, como prdida de lquido, [place], dolores de cabeza intensos o clicos abdominales.  Si tiene [employer]. Otros estudios que podrn realizarse durante el segundo trimestre incluyen lo siguiente:  Anlisis de sangre para detectar:  Concentraciones de hierro bajas (anemia).  Diabetes gestacional (entre la semana 24 y la 28).  Anticuerpos Rh.  Anlisis de orina para detectar infecciones, diabetes o protenas en la orina.  Una ecografa para confirmar que el beb crece y se desarrolla correctamente.  Una amniocentesis para diagnosticar posibles problemas genticos.  Estudios del feto para descartar espina bfida y sndrome de [place]. INSTRUCCIONES PARA EL CUIDADO EN EL HOGAR   Evite fumar, consumir hierbas, beber alcohol y tomar frmacos que no le hayan recetado. Estas sustancias qumicas afectan la formacin y el desarrollo del beb.  Siga las indicaciones del mdico en relacin con el uso de medicamentos. Durante el embarazo, hay medicamentos que son seguros de tomar y otros que no.  Haga actividad fsica solo en la forma indicada por el mdico. Sentir clicos uterinos es un buen signo para Restaurant manager, fast food actividad fsica.  Contine comiendo alimentos que sanos con regularidad.  Use un sostn que le brinde buen soporte si le [employer].  No se  d baos de inmersin en agua caliente, baos turcos ni saunas.  Colquese el cinturn de seguridad cuando conduzca.  No coma carne cruda ni queso sin cocinar; evite el contacto con las bandejas sanitarias de los gatos y la tierra que estos animales usan. Estos elementos contienen grmenes que pueden causar defectos congnitos en el beb.  Tome las vitaminas prenatales.  Si est estreida, pruebe un laxante suave (si el mdico lo autoriza). Consuma ms alimentos ricos en fibra, como vegetales y frutas frescos y Radiation protection practitioner. Beba gran cantidad de lquido para  mantener la orina de tono claro o color amarillo plido.  Dese baos de asiento con agua tibia para Engineer, materials o las molestias causadas por las hemorroides. Use una crema para las hemorroides si el mdico la autoriza.  Si tiene venas varicosas, use medias de descanso. Eleve los pies durante , 3 o 4veces por da. Limite la cantidad de sal en su dieta.  No levante objetos pesados, use zapatos de tacones bajos y [address].  Descanse con las piernas elevadas si tiene calambres o dolor de cintura.  Visite a su dentista si an no lo ha Occupational hygienist. Use un cepillo de dientes blando para higienizarse los dientes y psese el hilo dental con suavidad.  Puede seguir [employer], a menos que el mdico le indique lo contrario.  Concurra a todas las visitas prenatales segn las indicaciones de su mdico. SOLICITE ATENCIN MDICA SI:   [place].  Siente clicos leves, presin en la pelvis o dolor persistente en el abdomen.  Tiene nuseas, vmitos o diarrea persistentes.  Tiene secrecin vaginal con mal olor.  Siente dolor al [employer]oco[employer]illips. SOLICITE ATENCIN MDICA DE INMEDIATO SI:   Tiene fiebre.  Tiene una prdida de lquido por la vagina.  Tiene sangrado o pequeas prdidas vaginales.  Siente dolor intenso o clicos en el abdomen.  Sube o baja de peso rpidamente.  Tiene dificultad para respirar y siente dolor de pecho.  Sbitamente se le hinchan mucho el rostro, las [place], los tobillos, los pies o las piernas.  No ha sentido los movimientos del beb durante Georgianne Fick.  Siente un dolor de cabeza intenso que no se alivia con medicamentos.  Hay cambios en la visin. Document Released: 09/07/2005 Document Revised: 12/03/2013 [facility] Patient Information 2015 [place], [place]. This information is not intended to replace advice given to you by your health care provider. Make sure you discuss any questions you have with your  health care provider.

## 2015-09-01 LAB — HIV ANTIBODY (ROUTINE TESTING W REFLEX): HIV 1&2 Ab, 4th Generation: NONREACTIVE

## 2015-09-01 LAB — GLUCOSE TOLERANCE, 1 HOUR (50G) W/O FASTING: Glucose, 1 Hour GTT: 91 mg/dL (ref 70–140)

## 2015-09-02 LAB — RPR

## 2015-09-18 ENCOUNTER — Encounter (HOSPITAL_COMMUNITY): Payer: Self-pay | Admitting: *Deleted

## 2015-09-18 ENCOUNTER — Inpatient Hospital Stay (HOSPITAL_COMMUNITY)
Admission: AD | Admit: 2015-09-18 | Discharge: 2015-09-18 | Disposition: A | Discharge status: Home / Self Care | Payer: Self-pay | Source: Ambulatory Visit | Attending: Obstetrics & Gynecology | Admitting: Obstetrics & Gynecology

## 2015-09-18 DIAGNOSIS — N390 Urinary tract infection, site not specified: Secondary | ICD-10-CM

## 2015-09-18 DIAGNOSIS — Z3A30 30 weeks gestation of pregnancy: Secondary | ICD-10-CM | POA: Insufficient documentation

## 2015-09-18 DIAGNOSIS — O09213 Supervision of pregnancy with history of pre-term labor, third trimester: Secondary | ICD-10-CM

## 2015-09-18 DIAGNOSIS — O09893 Supervision of other high risk pregnancies, third trimester: Secondary | ICD-10-CM

## 2015-09-18 DIAGNOSIS — O2343 Unspecified infection of urinary tract in pregnancy, third trimester: Secondary | ICD-10-CM | POA: Insufficient documentation

## 2015-09-18 LAB — URINALYSIS, ROUTINE W REFLEX MICROSCOPIC
Bilirubin Urine: NEGATIVE
GLUCOSE, UA: 250 mg/dL — AB
KETONES UR: 15 mg/dL — AB
Nitrite: NEGATIVE
PH: 6.5 (ref 5.0–8.0)
Protein, ur: NEGATIVE mg/dL
Specific Gravity, Urine: 1.02 (ref 1.005–1.030)
Urobilinogen, UA: 2 mg/dL — ABNORMAL HIGH (ref 0.0–1.0)

## 2015-09-18 LAB — OB RESULTS CONSOLE GC/CHLAMYDIA: GC PROBE AMP, GENITAL: NEGATIVE

## 2015-09-18 LAB — URINE MICROSCOPIC-ADD ON

## 2015-09-18 LAB — WET PREP, GENITAL
Clue Cells Wet Prep HPF POC: NONE SEEN
Trich, Wet Prep: NONE SEEN
YEAST WET PREP: NONE SEEN

## 2015-09-18 MED ORDER — CEPHALEXIN 500 MG PO CAPS: 500.0000 mg | ORAL_CAPSULE | Freq: Four times a day (QID) | ORAL | Status: DC

## 2015-09-18 NOTE — Discharge Instructions (Signed)
Cathy Nash, you were found to have a UTI. Please take the antibiotic Keflex, 1 tablet every 6 hours, for 5 days. We also ran a test for gonorrhea and chlamydia. If this result is positive, we will contact you.  If you develop a fever (temperature of 100.2 F or higher), notice bloody discharge, have intense back pain, or do not have improvement of symptoms after completing antibiotics, please seek medical attention.  Thank you!  Embarazo e infeccin del tracto urinario (Pregnancy and Urinary Tract Infection) Una infeccin urinaria (IU) puede ocurrir en Corporate treasurer del tracto urinario. La infeccin urinaria puede [employer] utteres, los riones (pielonefritis), la vejiga (cistitis) y Engineer, mining (uretritis). Todas las mujeres embarazadas deben ser estudiadas para diagnosticar la presencia de bacterias en el tracto urinario. La identificacin y el tratamiento de una infeccin urinaria disminuye el riesgo de un parto prematuro y de Environmental education officer infecciones ms graves en la madre y el beb. CAUSAS Las bacterias causan casi todas las infecciones urinarias.  FACTORES DE RIESGO Hay muchos factores que pueden aumentar sus probabilidades de contraer una infeccin urinaria (IU) durante el [place]. Pueden ser:  Winferd Humphrey uretra corta.  Falta de aseo y malos hbitos de higiene.  Las [employer].  Obstruccin de la orina en el tracto urinario.  Problemas con los msculos o nervios plvicos.  Diabetes.  Obesidad.  Problemas en la vejiga despus de tener varios hijos.  Antecedentes de infeccin urinaria. SIGNOS Y SNTOMAS   Dolor, ardor o sensacin de ardor al [employer].  Sentir la necesidad de Geographical information systems officer de inmediato [place]).  Prdida del control vesical (incontinencia urinaria).  Orinar con ms frecuencia de lo comn en el embarazo.  Malestar en la zona inferior del abdomen o en la espalda.  Mason Jim turbia.  Sangre en la orina (hematuria).  Grant Ruts. Cuando se  infectan los riones, los sntomas pueden ser:  Dolor de espalda.  Dolor lateral en el lado derecho ms que en el lado izquierdo.  Grant Ruts.  Escalofros.  Nuseas.  Vmitos. DIAGNSTICO  Una infeccin del tracto urinario se suele diagnosticar a travs de la orina. A veces se realizan pruebas y procedimientos adicionales. Estos pueden ser:  [place] de los riones, los urteres, la vejiga y Engineer, mining.  Observar la vejiga con un tubo que ilumina (cistoscopa). TRATAMIENTO Por lo general, las IU pueden tratarse con medicamentos antibiticos.  INSTRUCCIONES PARA EL CUIDADO EN EL HOGAR   Tome slo medicamentos de venta libre o recetados, segn las indicaciones del mdico. Si le han recetado antibiticos, tmelos segn las indicaciones. Tmelos todos, aunque se sienta mejor.  Beba suficiente lquido para Photographer orina clara o de color amarillo plido.  No tenga relaciones sexuales hasta que la infeccin haya desaparecido o el mdico la autorice.  Asegrese de [employer] hagan estudios para Engineer, manufacturing una infeccin urinaria durante el [place]. Estas infecciones suelen reaparecer. Para prevenir una infeccin urinaria en el futuro  Practique buenos hbitos higinicos. Siempre debe limpiarse desde adelante hacia atrs. Use el tissue slo una vez.  No retenga la orina. Orine tan pronto como sea posible cuando tenga ganas.  No se haga duchas vaginales ni use desodorantes en aerosol.  Lave con agua tibia y jabn alrededor de la zona genital y el ano.  Vace la vejiga antes y despus de Management consultant.  Use ropa interior con algodn en la entrepierna.  Evite la cafena y las [address]. Estas sustancias irritan la vejiga.  Beba jugo de arndanos o tome comprimidos de  arndano. Esto puede disminuir el riesgo de sufrir una infeccin urinaria.  No beba alcohol.  Cumpla con las visitas de control y hgase todos los anlisis segn lo programado. SOLICITE ATENCIN  MDICA SI:   Los sntomas empeoran.  Tiene fiebre an despus de [address]  [address] de [address].  Tiene una erupcin.  Siente que usted tiene problemas con los medicamentos recetados.  Tiene flujo vaginal anormal. SOLICITE ATENCIN MDICA DE INMEDIATO SI:   Siente dolor en la espalda o a los lados.  Tiene escalofros.  Observa sangre en la orina.  Tiene nuseas o vmitos.  Siente contracciones en el tero.  Tiene una perdida de lquido en chorro por la vagina. ASEGRESE DE QUE:  Comprende estas instrucciones.   Controlar su afeccin.   Recibir ayuda de inmediato si no mejora o si empeora.    Esta informacin no tiene Theme park manager el consejo del mdico. Asegrese de hacerle al mdico cualquier pregunta que tenga.   Document Released: 08/22/2012 Document Revised: 09/18/2013 Elsevier Interactive Patient Education 2016 [employer].  Informacin sobre el parto prematuro  (Preterm Labor Information)  Se llama parto prematuro cuando se inicia antes de las 37 semanas de [place]. La duracin de un embarazo normal es de 39 a 41 semanas.  CAUSAS  Generalmente las causas del parto prematuro no se conocen. La causa ms frecuente conocida es una infeccin.  FACTORES DE RIESGO   Historia previa de parto prematuro.  Romper la bolsa de aguas antes de [place].  La placenta cubre la abertura del cuello.  La placenta se despega del tero.  El cuello es demasiado dbil para contener al beb en el tero.  Hay mucho lquido en el saco amnitico.  Consumo de drogas o hbito de fumar durante el [place].  No aumentar de peso lo suficiente durante el [employer].  Mujeres menores de 18 aos o mayores de [address] [place].  Tener bajos ingresos.  Pertenecer a Engineer, production. SNTOMAS   Clicos similares a los menstruales, dolor en el vientre (abdominal) o dolor en la espalda.  Contracciones regulares, tan frecuentes como seis en [employer]. Pueden ser suaves o  dolorosas.  Contracciones que comienzan en la parte superior del vientre. Luego bajan hacia la zona inferior del vientre y [employer].  Presin en la zona inferior del vientre que Advertising account executive.  Sangrado que proviene de la vagina.  Prdida de lquido por la vagina. TRATAMIENTO  El tratamiento depende de:   Su estado.  El [place] del beb.  Cuntas semanas tiene de [place]. El mdico podr indicarle:   Medicamentos para Print production planner.  Que permanezca en la cama excepto para ir al bao (reposo en cama).  Que permanezca en el hospital. QU DEBE HACER SI PIENSA QUE EST EN TRABAJO DE PARTO PREMATURO?  Comunquese con su mdico de inmediato. Debe concurrir al hospital para ser controlada inmediatamente.  CMO PUEDE EVITAR EL TRABAJO DE PARTO PREMATURO EN FUTUROS EMBARAZOS?   Si fuma, abandone el hbito.  Mantenga un aumento de peso saludable.  Notome drogas ni manipule sustancias qumicas que no necesita.  Informe a su mdico si piensa que tiene una infeccin.  Informe a su mdico si tuvo un trabajo de parto prematuro anteriormente.   Esta informacin no tiene Theme park manager el consejo del mdico. Asegrese de hacerle al mdico cualquier pregunta que tenga.   Document Released: 12/31/2010 Document Revised: 07/31/2013 Elsevier Interactive Patient Education [employer]

## 2015-09-18 NOTE — MAU Provider Note (Signed)
Patient is 34 y.o. [id number] [redacted]w[redacted]d here with complaints of thick/yellow discharge.  OB hx of preterm delivery due to pre-eclampsia.   S:  Discharge: -Noticed thick/yellow discharge last night, 09/17/15, in the shower. -Associated with the feeling of having to strain with urination.  -Denies burning/dysuria -Denies n/v/d -States she felt hot last night -Was treated for a UTI 08/14/15, receiving Keflex -Has not had sexual intercourse in the last few days  Pelvic pain: -Ongoing for almost 3 months -Has taken tylenol, which has helped a little  +FM, denies LOF, VB, contractions. Does endorse vaginal discharge.  O: Blood pressure 110/67, pulse 81, temperature 98.6 F (37 C), temperature source Oral, resp. rate 18, last menstrual period 02/19/2015, not currently breastfeeding.  Physical Exam: General: Well-nourished, well-appearing woman in mild discomfort Cardiac: RR, S1, S2, no murmurs, rubs or gallops Pulm: CTAB Abdomen: Gravid. Fetus cephalic. MSK: No CVA tenderness.  Extremities: No LE edema.   Imaging: Fetal monitoring does not show contractions.   Labs: Urinalysis    Component Value Date/Time   COLORURINE YELLOW 09/18/2015 0930   APPEARANCEUR CLEAR 09/18/2015 0930   LABSPEC 1.020 09/18/2015 0930   PHURINE 6.5 09/18/2015 0930   GLUCOSEU 250* 09/18/2015 0930   HGBUR SMALL* 09/18/2015 0930   BILIRUBINUR NEGATIVE 09/18/2015 0930   KETONESUR 15* 09/18/2015 0930   PROTEINUR NEGATIVE 09/18/2015 0930   UROBILINOGEN 2.0* 09/18/2015 0930   NITRITE NEGATIVE 09/18/2015 0930   LEUKOCYTESUR TRACE* 09/18/2015 0930   Wet prep: negative Gc/chlamydia: pending  A: Patient is 34 y.o. [id number] [redacted]w[redacted]d reporting vaginal discharge and continued pelvic pain likely secondary to UTI and normal pelvic pressure of pregnancy. Urinalysis consistent with UTI. Wet prep negative for yeast, trich and BV. GC/Chlamydia pending. No CVA tenderness, fever, n/v, so pyelonephritis unlikely.   P: Discharge  home - Take Keflex 500 mg q6 x 5 days. Encourage drinking lots of fluids. Given that this is the second UTI of pregnancy, consider starting suppressive therapy at follow-up. Culture not performed for initial UTI. - Follow-up GC/chlamydia - preterm labor precautions dicussed - Handout given - Follow-up with OB provider  Future Appointments Date Time Provider Department Center  09/28/2015 10:05 AM Kathrynn Running, MD [facility]    OB FELLOW MAU DISCHARGE ATTESTATION  I have seen and examined this patient; I agree with above documentation in the resident's note.    Silvano Bilis, MD 1:05 PM

## 2015-09-18 NOTE — MAU Note (Signed)
Has been having abd pain all night. When she stands for a long time if feels like a lump or ball in her vaginal area.  Pain with urination, having to strain when she urinates.  ? Fluid came out when she was in the shower.

## 2015-09-19 LAB — CULTURE, OB URINE: Culture: >=100000

## 2015-09-21 LAB — GC/CHLAMYDIA PROBE AMP (~~LOC~~) NOT AT ARMC
Chlamydia: NEGATIVE
NEISSERIA GONORRHEA: NEGATIVE

## 2015-09-28 ENCOUNTER — Ambulatory Visit (INDEPENDENT_AMBULATORY_CARE_PROVIDER_SITE_OTHER): Payer: Self-pay | Admitting: Obstetrics and Gynecology

## 2015-09-28 ENCOUNTER — Inpatient Hospital Stay (HOSPITAL_COMMUNITY)
Admission: AD | Admit: 2015-09-28 | Discharge: 2015-09-29 | Disposition: A | Discharge status: Home / Self Care | Payer: Self-pay | Source: Ambulatory Visit | Attending: Family Medicine | Admitting: Family Medicine

## 2015-09-28 ENCOUNTER — Encounter (HOSPITAL_COMMUNITY): Payer: Self-pay

## 2015-09-28 VITALS — BP 106/59 | HR 65 | Temp 97.5°F | Wt 146.7 lb

## 2015-09-28 DIAGNOSIS — Z3A31 31 weeks gestation of pregnancy: Secondary | ICD-10-CM | POA: Insufficient documentation

## 2015-09-28 DIAGNOSIS — N816 Rectocele: Secondary | ICD-10-CM

## 2015-09-28 DIAGNOSIS — Z3490 Encounter for supervision of normal pregnancy, unspecified, unspecified trimester: Secondary | ICD-10-CM

## 2015-09-28 DIAGNOSIS — O09213 Supervision of pregnancy with history of pre-term labor, third trimester: Secondary | ICD-10-CM

## 2015-09-28 DIAGNOSIS — M545 Low back pain: Secondary | ICD-10-CM | POA: Insufficient documentation

## 2015-09-28 DIAGNOSIS — R1032 Left lower quadrant pain: Secondary | ICD-10-CM

## 2015-09-28 DIAGNOSIS — Z3493 Encounter for supervision of normal pregnancy, unspecified, third trimester: Secondary | ICD-10-CM

## 2015-09-28 DIAGNOSIS — Z87891 Personal history of nicotine dependence: Secondary | ICD-10-CM | POA: Insufficient documentation

## 2015-09-28 DIAGNOSIS — O09893 Supervision of other high risk pregnancies, third trimester: Secondary | ICD-10-CM

## 2015-09-28 DIAGNOSIS — O26893 Other specified pregnancy related conditions, third trimester: Secondary | ICD-10-CM | POA: Insufficient documentation

## 2015-09-28 DIAGNOSIS — IMO0001 Reserved for inherently not codable concepts without codable children: Secondary | ICD-10-CM

## 2015-09-28 DIAGNOSIS — R109 Unspecified abdominal pain: Secondary | ICD-10-CM | POA: Insufficient documentation

## 2015-09-28 DIAGNOSIS — R1013 Epigastric pain: Secondary | ICD-10-CM | POA: Insufficient documentation

## 2015-09-28 DIAGNOSIS — R1031 Right lower quadrant pain: Secondary | ICD-10-CM

## 2015-09-28 HISTORY — DX: Anxiety disorder, unspecified: F41.9

## 2015-09-28 LAB — POCT URINALYSIS DIP (DEVICE)
BILIRUBIN URINE: NEGATIVE
Glucose, UA: NEGATIVE mg/dL
Ketones, ur: NEGATIVE mg/dL
LEUKOCYTES UA: NEGATIVE
NITRITE: NEGATIVE
Protein, ur: NEGATIVE mg/dL
Specific Gravity, Urine: 1.025 (ref 1.005–1.030)
Urobilinogen, UA: 1 mg/dL (ref 0.0–1.0)
pH: 6.5 (ref 5.0–8.0)

## 2015-09-28 LAB — URINALYSIS, ROUTINE W REFLEX MICROSCOPIC
Bilirubin Urine: NEGATIVE
Glucose, UA: >1000 mg/dL — AB
KETONES UR: 15 mg/dL — AB
LEUKOCYTES UA: NEGATIVE
NITRITE: NEGATIVE
PH: 6 (ref 5.0–8.0)
PROTEIN: NEGATIVE mg/dL
Specific Gravity, Urine: 1.025 (ref 1.005–1.030)
UROBILINOGEN UA: 0.2 mg/dL (ref 0.0–1.0)

## 2015-09-28 LAB — URINE MICROSCOPIC-ADD ON

## 2015-09-28 MED ORDER — GI COCKTAIL ~~LOC~~
30.0000 mL | Freq: Once | ORAL | Status: AC
  Administered 2015-09-29: 30 mL via ORAL
  Filled 2015-09-28 (×2): qty 30

## 2015-09-28 NOTE — MAU Provider Note (Signed)
History   CSN: [id number] Arrival date and time: 09/28/15 2221   Chief Complaint  Patient presents with  . Shortness of Breath  . Pelvic Pain  . Abdominal Pain   HPI Patient is 34 y.o. [id number] 9682w4d here with multiple complaints with pregnancy complicated by h/o PTL, recurrent UTI, asthma, h/o PIH . Reports she started having worsening lower abdominal pain, new onset midepigastric pain without radiation and worsening low back pain.  Midepigastric pain is not worsened or alleviated by eating. She reports lying back will make all of her sx worse. Denies alleviating sx. She reports this all worsened at 5 PM today. She reports she feels like these are contractions. Denies dysuria/polyuria. Denies CVA pain. Denies cough. Reports nausea but no vomiting. Reports chills but no fever.   +FM, denies LOF, VB, contractions, vaginal discharge.  OB History    Gravida Para Term Preterm AB TAB SAB Ectopic Multiple Living   4 3 2 1  0 0 0 0 0 3     Past Medical History  Diagnosis Date  . Asthma   . Asthma   . Preterm labor     1st pregnancy d/t pre-e  . Pregnancy induced hypertension     previous pregnancy  . Pyelonephritis     June 2013  . Varicose veins   . Vaginal pessary in situ   . Anxiety     Past Surgical History  Procedure Laterality Date  . No past surgeries      Family History  Problem Relation Age of Onset  . Anesthesia problems Neg Hx   . Hypotension Neg Hx   . Malignant hyperthermia Neg Hx   . Pseudochol deficiency Neg Hx   . Other Neg Hx   . Diabetes Mother   . Asthma Father     Social History  Substance Use Topics  . Smoking status: Former Games developermoker  . Smokeless tobacco: Never Used     Comment: yrs ago  . Alcohol Use: No    Allergies:  Allergies  Allergen Reactions  . Iohexol Hives and Shortness Of Breath     Code: SOB, Desc: IMMEDIATELY SOB FOLLOWING IV INJECTION, SPOTTY HIVES, PT GIVEN BENADRYL AND EPI-ARS 08/22/09, Onset Date: [id number]     Prescriptions  prior to admission  Medication Sig Dispense Refill Last Dose  . acetaminophen (TYLENOL) 500 MG tablet Take 500 mg by mouth every 6 (six) hours as needed for mild pain.   09/28/2015 at Unknown time  . cephALEXin (KEFLEX) 500 MG capsule Take 1 capsule (500 mg total) by mouth every 6 (six) hours. 20 capsule 0 09/27/2015 at Unknown time  . Prenatal Vit-Fe Fumarate-FA (MULTIVITAMIN-PRENATAL) 27-0.8 MG TABS tablet Take 1 tablet by mouth daily at 12 noon.   09/28/2015 at Unknown time  . albuterol (PROVENTIL HFA;VENTOLIN HFA) 108 (90 BASE) MCG/ACT inhaler Inhale 2 puffs into the lungs every 6 (six) hours as needed for wheezing or shortness of breath. 1 Inhaler 0 More than a month at Unknown time    Review of Systems  Constitutional: Positive for chills. Negative for fever.  Eyes: Negative for blurred vision and double vision.  Respiratory: Negative for cough and shortness of breath.   Cardiovascular: Negative for chest pain and orthopnea.  Gastrointestinal: Negative for nausea and vomiting.  Genitourinary: Negative for dysuria, frequency and flank pain.  Musculoskeletal: Positive for back pain (located in low back). Negative for myalgias.  Skin: Negative for rash.  Neurological: Negative for dizziness, tingling, weakness and headaches.  Endo/Heme/Allergies:  Does not bruise/bleed easily.  Psychiatric/Behavioral: Negative for depression and suicidal ideas. The patient is not nervous/anxious.    Physical Exam   Blood pressure 109/67, pulse 84, temperature 97.6 F (36.4 C), temperature source Oral, resp. rate 16, height  (1.575 m), weight 150 lb (68.04 kg), last menstrual period 02/19/2015, SpO2 99 %, not currently breastfeeding.  Physical Exam  Nursing note and vitals reviewed. Constitutional: She is oriented to person, place, and time. She appears well-developed and well-nourished. No distress.  Pregnant female  HENT:  Head: Normocephalic and atraumatic.  Eyes: Conjunctivae are normal. No  scleral icterus.  Neck: Normal range of motion. Neck supple.  Cardiovascular: Normal rate and intact distal pulses.   Respiratory: Effort normal. She exhibits no tenderness.  GI: Soft. There is no tenderness. There is no rebound and no guarding.  Gravid  Genitourinary: Vagina normal.  Musculoskeletal: Normal range of motion. She exhibits no edema.  Neurological: She is alert and oriented to person, place, and time.  Skin: Skin is warm and dry. No rash noted.  Psychiatric: She has a normal mood and affect.   Dilation: Fingertip Effacement (%): 50 Cervical Position: Middle Station: -3 Presentation: Undeterminable  MAU Course  Procedures  MDM NST 140/mod/+accels Multiple, no decelerations Toco: quiet  FFN-negative CMP-wnl  UA + glucose, moderate blood  EKG: normal appearing.  Flexeril  in MAU GI Cocktail  Assessment and Plan  Arlyce Circle is a 34 y.o. [id number] at [redacted]w[redacted]d with history of PTL at 35 weeks presenting with worsening abdominal pain located in the lower abdomen/pelvis and upper abdomen which patient feels is consistent with contractions but negative work up for .   #Concern for PTL: FFN negative. Unlikely PTL.  Reviewed reasons to return.   #Midepigastric pain: suspect GERD. GI Cocktail in MAU  #Low back pain: MSK most likely. Flexeril in MAU and provided #30 rx.   #FWB: reactive  Federico Flake 09/29/2015, 12:52 AM

## 2015-09-28 NOTE — Progress Notes (Signed)
Ultrasound scheduled 10/05/2015@2 :00PM

## 2015-09-28 NOTE — MAU Note (Signed)
Pt reports a lot of pain in her abd since 5 pm, headache x 1 hour, and lower back pain. Denies bleeding.

## 2015-09-28 NOTE — Progress Notes (Signed)
Subjective:  Cathy Nash is a 34 y.o. [id number] at 5141w4d being seen today for ongoing prenatal care.  Patient reports no complaints.  Contractions: Not present.  Vag. Bleeding: None. Movement: Present. Denies leaking of fluid.   The following portions of the patient's history were reviewed and updated as appropriate: allergies, current medications, past family history, past medical history, past social history, past surgical history and problem list. Problem list updated.  Objective:   Filed Vitals:   09/28/15 1036  BP: 106/59  Pulse: 65  Temp: 97.5 F (36.4 C)  Weight: 146 lb 11.2 oz (66.543 kg)    Fetal Status: Fetal Heart Rate (bpm): 130 Fundal Height: 31 cm Movement: Present     General:  Alert, oriented and cooperative. Patient is in no acute distress.  Skin: Skin is warm and dry. No rash noted.   Cardiovascular: Normal heart rate noted  Respiratory: Normal respiratory effort, no problems with respiration noted  Abdomen: Soft, gravid, appropriate for gestational age. Pain/Pressure: Present     Pelvic: Vag. Bleeding: None Vag D/C Character: Yellow   Cervical exam deferred        Extremities: Normal range of motion.  Edema: Trace  Mental Status: Normal mood and affect. Normal behavior. Normal judgment and thought content.   Urinalysis:      Assessment and Plan:  Pregnancy: [id number] at 3041w4d  # Rectocele - no history significant laceration last pregnancy - patient desires vaginal delivery and i explained no absolute contraindication to this - reassess PP  # Pregnancy - patient desires f/u anatomy as spine incompletely visualized - ordering today  # PTL, hx of - not clear why not on 17-P  There are no diagnoses linked to this encounter. Preterm labor symptoms and general obstetric precautions including but not limited to vaginal bleeding, contractions, leaking of fluid and fetal movement were reviewed in detail with the patient. Please refer to After Visit Summary  for other counseling recommendations.  Return in about 2 weeks (around 10/12/2015).   Kathrynn RunningNoah Bedford Elandra Powell, MD

## 2015-09-28 NOTE — Progress Notes (Signed)
Used interpreter Elna Breslowarol Hernandez .

## 2015-09-29 ENCOUNTER — Encounter (HOSPITAL_COMMUNITY): Payer: Self-pay

## 2015-09-29 DIAGNOSIS — R1032 Left lower quadrant pain: Secondary | ICD-10-CM

## 2015-09-29 DIAGNOSIS — R1013 Epigastric pain: Secondary | ICD-10-CM

## 2015-09-29 DIAGNOSIS — R1031 Right lower quadrant pain: Secondary | ICD-10-CM

## 2015-09-29 DIAGNOSIS — O09213 Supervision of pregnancy with history of pre-term labor, third trimester: Secondary | ICD-10-CM

## 2015-09-29 LAB — COMPREHENSIVE METABOLIC PANEL
ALBUMIN: 2.7 g/dL — AB (ref 3.5–5.0)
ALK PHOS: 110 U/L (ref 38–126)
ALT: 11 U/L — AB (ref 14–54)
AST: 21 U/L (ref 15–41)
Anion gap: 5 (ref 5–15)
BUN: 6 mg/dL (ref 6–20)
CALCIUM: 8.2 mg/dL — AB (ref 8.9–10.3)
CO2: 20 mmol/L — AB (ref 22–32)
Chloride: 110 mmol/L (ref 101–111)
Creatinine, Ser: 0.43 mg/dL — ABNORMAL LOW (ref 0.44–1.00)
GFR calc Af Amer: >60 mL/min (ref >60)
GFR calc non Af Amer: >60 mL/min (ref >60)
GLUCOSE: 124 mg/dL — AB (ref 65–99)
Potassium: 3.1 mmol/L — ABNORMAL LOW (ref 3.5–5.1)
SODIUM: 135 mmol/L (ref 135–145)
TOTAL PROTEIN: 6.8 g/dL (ref 6.5–8.1)
Total Bilirubin: 0.5 mg/dL (ref 0.3–1.2)

## 2015-09-29 LAB — FETAL FIBRONECTIN: Fetal Fibronectin: NEGATIVE

## 2015-09-29 MED ORDER — CYCLOBENZAPRINE HCL 5 MG PO TABS
5.0000 mg | ORAL_TABLET | Freq: Once | ORAL | Status: AC
  Administered 2015-09-29: 5 mg via ORAL
  Filled 2015-09-29: qty 1

## 2015-09-29 MED ORDER — CYCLOBENZAPRINE HCL 5 MG PO TABS: 5.0000 mg | ORAL_TABLET | Freq: Three times a day (TID) | ORAL | Status: DC | PRN

## 2015-09-29 NOTE — Discharge Instructions (Signed)
Informacin sobre el parto prematuro  (Preterm Labor Information) El parto prematuro comienza antes de la semana 37 de embarazo. La duracin de un embarazo normal es de 39 a 41 semanas.  CAUSAS  Generalmente no hay una causa que pueda identificarse del motivo por el que una mujer comienza un trabajo de parto prematuro. Sin embargo, una de las causas conocidas ms frecuentes son las infecciones. Las infecciones del tero, el cuello, la vagina, el lquido amnitico, la vejiga, los riones y hasta de los pulmones (neumona) pueden hacer que el trabajo de parto se inicie. Otras causas que pueden sospecharse son:   Infecciones urogenitales, como infecciones por hongos y vaginosis bacteriana.   Anormalidades uterinas (forma del tero, sptum uterino, fibromas, hemorragias en la placenta).   Un cuello que ha sido operado (puede ser que no permanezca cerrado).   Malformaciones del feto.   Gestaciones mltiples (mellizos, trillizos y ms).   Ruptura del saco amnitico.  FACTORES DE RIESGO   Historia previa de parto prematuro.   Tener ruptura prematura de las membranas (RPM).   La placenta cubre la abertura del cuello (placenta previa).   La placenta se separa del tero (abrupcin placentaria).   El cuello es demasiado dbil para contener al beb en el tero (cuello incompetente).   Hay mucho lquido en el saco amnitico (polihidramnios).   Consumo de drogas o hbito de fumar durante el embarazo.   No aumentar de peso lo suficiente durante el embarazo.   Mujeres menores de 18 aos o mayores de 35 aos.   Nivel socioeconmico bajo.   Pertenecer a la raza afroamericana. SNTOMAS  Los signos y sntomas del trabajo de parto prematuro son:   Clicos similares a los menstruales, dolor abdominal o dolor de espalda.  Contracciones uterinas regulares, tan frecuentes como seis por hora, sin importar su intensidad (pueden ser suaves o dolorosas).  Contracciones que comienzan  en la parte superior del tero y se expanden hacia abajo, a la zona inferior del abdomen y la espalda.   Sensacin de aumento de presin en la pelvis.   Aparece una secrecin acuosa o sanguinolenta por la vagina.  TRATAMIENTO  Segn el tiempo del embarazo y otras circunstancias, el mdico puede indicar reposo en cama. Si es necesario, le indicarn medicamentos para detener las contracciones y para madurar los pulmones del feto. Si el trabajo de parto se inicia antes de las 34 semanas de embarazo, se recomienda la hospitalizacin. El tratamiento depende de las condiciones en que se encuentren usted y el feto.  QU DEBE HACER SI PIENSA QUE EST EN TRABAJO DE PARTO PREMATURO?  Comunquese con su mdico inmediatamente. Debe concurrir al hospital para ser controlada inmediatamente.  CMO PUEDE EVITAR EL TRABAJO DE PARTO PREMATURO EN FUTUROS EMBARAZOS?  Usted debe:   Si fuma, abandonar el hbito.  Mantener un peso saludable y evitar sustancias qumicas y drogas innecesarias.  Controlar todo tipo de infeccin.  Informe a su mdico si tiene una historia conocida de trabajo de parto prematuro.   Esta informacin no tiene como fin reemplazar el consejo del mdico. Asegrese de hacerle al mdico cualquier pregunta que tenga.   Document Released: 03/06/2008 Document Revised: 07/31/2013 Elsevier Interactive Patient Education 2016 Elsevier Inc.  

## 2015-10-05 ENCOUNTER — Ambulatory Visit (HOSPITAL_COMMUNITY)
Admission: RE | Admit: 2015-10-05 | Discharge: 2015-10-05 | Disposition: A | Discharge status: Home / Self Care | Payer: Self-pay | Source: Ambulatory Visit | Attending: Obstetrics and Gynecology | Admitting: Obstetrics and Gynecology

## 2015-10-05 DIAGNOSIS — IMO0002 Reserved for concepts with insufficient information to code with codable children: Secondary | ICD-10-CM

## 2015-10-05 DIAGNOSIS — Z3490 Encounter for supervision of normal pregnancy, unspecified, unspecified trimester: Secondary | ICD-10-CM

## 2015-10-05 DIAGNOSIS — Z36 Encounter for antenatal screening of mother: Secondary | ICD-10-CM | POA: Insufficient documentation

## 2015-10-06 DIAGNOSIS — IMO0002 Reserved for concepts with insufficient information to code with codable children: Secondary | ICD-10-CM | POA: Insufficient documentation

## 2015-10-12 ENCOUNTER — Encounter: Payer: Self-pay | Admitting: Family Medicine

## 2015-10-14 ENCOUNTER — Inpatient Hospital Stay (HOSPITAL_COMMUNITY)
Admission: AD | Admit: 2015-10-14 | Discharge: 2015-10-17 | DRG: 775 | Disposition: A | Discharge status: Home / Self Care | Payer: Medicaid Other | Source: Ambulatory Visit | Attending: Obstetrics and Gynecology | Admitting: Obstetrics and Gynecology

## 2015-10-14 ENCOUNTER — Encounter: Payer: Self-pay | Admitting: Family

## 2015-10-14 ENCOUNTER — Encounter (HOSPITAL_COMMUNITY): Payer: Self-pay | Admitting: *Deleted

## 2015-10-14 DIAGNOSIS — Z3A34 34 weeks gestation of pregnancy: Secondary | ICD-10-CM

## 2015-10-14 DIAGNOSIS — Z833 Family history of diabetes mellitus: Secondary | ICD-10-CM

## 2015-10-14 DIAGNOSIS — O4700 False labor before 37 completed weeks of gestation, unspecified trimester: Secondary | ICD-10-CM | POA: Diagnosis present

## 2015-10-14 DIAGNOSIS — F419 Anxiety disorder, unspecified: Secondary | ICD-10-CM | POA: Diagnosis present

## 2015-10-14 DIAGNOSIS — O99344 Other mental disorders complicating childbirth: Secondary | ICD-10-CM | POA: Diagnosis present

## 2015-10-14 DIAGNOSIS — D649 Anemia, unspecified: Secondary | ICD-10-CM | POA: Diagnosis present

## 2015-10-14 DIAGNOSIS — IMO0002 Reserved for concepts with insufficient information to code with codable children: Secondary | ICD-10-CM

## 2015-10-14 DIAGNOSIS — O9902 Anemia complicating childbirth: Secondary | ICD-10-CM | POA: Diagnosis present

## 2015-10-14 DIAGNOSIS — Z825 Family history of asthma and other chronic lower respiratory diseases: Secondary | ICD-10-CM

## 2015-10-14 DIAGNOSIS — O9952 Diseases of the respiratory system complicating childbirth: Secondary | ICD-10-CM | POA: Diagnosis present

## 2015-10-14 DIAGNOSIS — Z87891 Personal history of nicotine dependence: Secondary | ICD-10-CM

## 2015-10-14 LAB — URINALYSIS, ROUTINE W REFLEX MICROSCOPIC
BILIRUBIN URINE: NEGATIVE
Glucose, UA: NEGATIVE mg/dL
Ketones, ur: 15 mg/dL — AB
Leukocytes, UA: NEGATIVE
NITRITE: NEGATIVE
Protein, ur: NEGATIVE mg/dL
SPECIFIC GRAVITY, URINE: 1.015 (ref 1.005–1.030)
UROBILINOGEN UA: 0.2 mg/dL (ref 0.0–1.0)
pH: 5.5 (ref 5.0–8.0)

## 2015-10-14 LAB — CBC
HEMATOCRIT: 29.9 % — AB (ref 36.0–46.0)
HEMOGLOBIN: 10 g/dL — AB (ref 12.0–15.0)
MCH: 29.1 pg (ref 26.0–34.0)
MCHC: 33.4 g/dL (ref 30.0–36.0)
MCV: 86.9 fL (ref 78.0–100.0)
Platelets: 258 10*3/uL (ref 150–400)
RBC: 3.44 MIL/uL — ABNORMAL LOW (ref 3.87–5.11)
RDW: 13.7 % (ref 11.5–15.5)
WBC: 6.6 10*3/uL (ref 4.0–10.5)

## 2015-10-14 LAB — URINE MICROSCOPIC-ADD ON

## 2015-10-14 LAB — AMNISURE RUPTURE OF MEMBRANE (ROM) NOT AT ARMC: Amnisure ROM: POSITIVE

## 2015-10-14 LAB — OB RESULTS CONSOLE GBS: STREP GROUP B AG: NEGATIVE

## 2015-10-14 LAB — GROUP B STREP BY PCR: Group B strep by PCR: NEGATIVE

## 2015-10-14 LAB — TYPE AND SCREEN
ABO/RH(D): O POS
ANTIBODY SCREEN: NEGATIVE

## 2015-10-14 LAB — WET PREP, GENITAL
Clue Cells Wet Prep HPF POC: NONE SEEN
Trich, Wet Prep: NONE SEEN
Yeast Wet Prep HPF POC: NONE SEEN

## 2015-10-14 MED ORDER — OXYTOCIN BOLUS FROM INFUSION
500.0000 mL | INTRAVENOUS | Status: DC
  Administered 2015-10-15: 500 mL via INTRAVENOUS

## 2015-10-14 MED ORDER — SODIUM CHLORIDE 0.9 % IV SOLN
2.0000 g | Freq: Four times a day (QID) | INTRAVENOUS | Status: DC
  Administered 2015-10-14 – 2015-10-15 (×5): 2 g via INTRAVENOUS
  Filled 2015-10-14 (×7): qty 2000

## 2015-10-14 MED ORDER — LIDOCAINE HCL (PF) 1 % IJ SOLN
30.0000 mL | INTRAMUSCULAR | Status: DC | PRN
  Filled 2015-10-14: qty 30

## 2015-10-14 MED ORDER — AZITHROMYCIN 500 MG PO TABS
1000.0000 mg | ORAL_TABLET | Freq: Once | ORAL | Status: AC
  Administered 2015-10-14: 1000 mg via ORAL
  Filled 2015-10-14: qty 2

## 2015-10-14 MED ORDER — OXYTOCIN 40 UNITS IN LACTATED RINGERS INFUSION - SIMPLE MED
62.5000 mL/h | INTRAVENOUS | Status: DC
  Administered 2015-10-15: 62.5 mL/h via INTRAVENOUS
  Filled 2015-10-14: qty 1000

## 2015-10-14 MED ORDER — ONDANSETRON HCL 4 MG/2ML IJ SOLN
4.0000 mg | Freq: Four times a day (QID) | INTRAMUSCULAR | Status: DC | PRN
  Administered 2015-10-14: 4 mg via INTRAVENOUS
  Filled 2015-10-14: qty 2

## 2015-10-14 MED ORDER — BETAMETHASONE SOD PHOS & ACET 6 (3-3) MG/ML IJ SUSP
12.0000 mg | INTRAMUSCULAR | Status: AC
  Administered 2015-10-14 – 2015-10-15 (×2): 12 mg via INTRAMUSCULAR
  Filled 2015-10-14 (×2): qty 2

## 2015-10-14 MED ORDER — LACTATED RINGERS IV SOLN
INTRAVENOUS | Status: DC
  Administered 2015-10-14 – 2015-10-15 (×6): via INTRAVENOUS

## 2015-10-14 MED ORDER — CITRIC ACID-SODIUM CITRATE 334-500 MG/5ML PO SOLN: 30.0000 mL | ORAL | Status: DC | PRN

## 2015-10-14 MED ORDER — ALBUTEROL SULFATE (2.5 MG/3ML) 0.083% IN NEBU: 3.0000 mL | INHALATION_SOLUTION | Freq: Four times a day (QID) | RESPIRATORY_TRACT | Status: DC | PRN

## 2015-10-14 MED ORDER — ACETAMINOPHEN 325 MG PO TABS
650.0000 mg | ORAL_TABLET | ORAL | Status: DC | PRN
  Administered 2015-10-15: 650 mg via ORAL
  Filled 2015-10-14: qty 2

## 2015-10-14 MED ORDER — FENTANYL CITRATE (PF) 100 MCG/2ML IJ SOLN
100.0000 ug | INTRAMUSCULAR | Status: DC | PRN
  Administered 2015-10-14 – 2015-10-15 (×5): 100 ug via INTRAVENOUS
  Filled 2015-10-14 (×5): qty 2

## 2015-10-14 MED ORDER — LACTATED RINGERS IV SOLN: 500.0000 mL | INTRAVENOUS | Status: DC | PRN

## 2015-10-14 NOTE — Progress Notes (Signed)
Labor Progress Note  Cathy Nash is a 34 y.o. [id number] at 5972w6d admitted for PPROM s/p BMZx1.  S: Patient requesting IV pain medication, as she is feeling strong contractions about every 2 minutes. She is not planning on an epidural.   O:  BP 110/62 mmHg  Pulse 87  Temp(Src) 97.7 F (36.5 C) (Oral)  Resp 20  Ht 5\' 2"  (1.575 m)  Wt 68.947 kg (152 lb)  BMI 27.79 kg/m2  LMP 02/19/2015 (Approximate)    FHT:  FHR: 140 bpm, variability: moderate,  accelerations:  Present,  decelerations:  Absent UC:   Occasional/Not picking up well on monitor SVE:   Dilation: 3 Effacement (%): 50 Station: Ballotable Exam by:: Dr. Ashok PallWouk SROM: pinkish fluid @ ~1000 on 10/14/15.    Labs: Lab Results  Component Value Date   WBC 6.6 10/14/2015   HGB 10.0* 10/14/2015   HCT 29.9* 10/14/2015   MCV 86.9 10/14/2015   PLT 258 10/14/2015    Assessment / Plan: 34 y.o. [id number] 5872w6d who presented with PPROM in early labor, now with more painful contractions.  Spontaneous labor, progressing normally  Labor: Plan to begin induction at midnight with pitocin 1x1, as patient will be 7673w0d.  Fetal Wellbeing:  Category I Pain Control:  Fentanyl Anticipated MOD:  NSVD  Expectant management  Dani GobbleHillary Fitzgerald, MD [facility], PGY-1

## 2015-10-14 NOTE — Progress Notes (Signed)
Pt advised to call for assistance for bathroom after pm. Pt verbalized understanding, support person present.

## 2015-10-14 NOTE — Progress Notes (Signed)
Found pt eating regular diet, attempted to hide food when RN entered room, reinforced with teach back the importance of no food.

## 2015-10-14 NOTE — H&P (Signed)
LABOR AND DELIVERY ADMISSION HISTORY AND PHYSICAL NOTE  Cathy Nash is a 34 y.o. female [phone] with IUP at [redacted]w[redacted]d by L/10 presenting for leakage of fluid, contractions, and vaginal bleeding.  For 2 days patient has had blood-tinged mucousy discharge. Since 10 AM this morning had moderate amount of bleeding, leakage of small amount of liquidy fluid, and moderately painful contractions. No abdominal pain outside of the contractions.    Prenatal History/Complications:  Past Medical History: Past Medical History  Diagnosis Date  . Asthma   . Asthma   . Preterm labor     1st pregnancy d/t pre-e  . Pregnancy induced hypertension     previous pregnancy  . Pyelonephritis     June 2013  . Varicose veins   . Vaginal pessary in situ   . Anxiety     Past Surgical History: Past Surgical History  Procedure Laterality Date  . No past surgeries      Obstetrical History: OB History    Gravida Para Term Preterm AB TAB SAB Ectopic Multiple Living   0 0 0 0 0 3      Social History: Social History   Social History  . Marital Status: Single    Spouse Name: N/A  . Number of Children: N/A  . Years of Education: N/A   Social History Main Topics  . Smoking status: Former Games developer  . Smokeless tobacco: Never Used     Comment: yrs ago  . Alcohol Use: No  . Drug Use: No  . Sexual Activity:    Partners: Male    Birth Control/ Protection: None   Other Topics Concern  . None   Social History Narrative   ** Merged History Encounter **       ** Merged History Encounter **        Family History: Family History  Problem Relation Age of Onset  . Anesthesia problems Neg Hx   . Hypotension Neg Hx   . Malignant hyperthermia Neg Hx   . Pseudochol deficiency Neg Hx   . Other Neg Hx   . Diabetes Mother   . Asthma Father     Allergies: Allergies  Allergen Reactions  . Iohexol Hives and Shortness Of Breath     Code: SOB, Desc: IMMEDIATELY SOB FOLLOWING IV  INJECTION, SPOTTY HIVES, PT GIVEN BENADRYL AND EPI-ARS 08/22/09, Onset Date: [id number]     Prescriptions prior to admission  Medication Sig Dispense Refill Last Dose  . acetaminophen (TYLENOL) 500 MG tablet Take 500 mg by mouth every 6 (six) hours as needed for mild pain.   10/14/2015  . albuterol (PROVENTIL HFA;VENTOLIN HFA) 108 (90 BASE) MCG/ACT inhaler Inhale 2 puffs into the lungs every 6 (six) hours as needed for wheezing or shortness of breath. 1 Inhaler 0 10/14/2015  . Prenatal Vit-Fe Fumarate-FA (MULTIVITAMIN-PRENATAL) 27-0.8 MG TABS tablet Take 1 tablet by mouth daily at 12 noon.   10/14/2015     Review of Systems   All systems reviewed and negative except as stated in HPI  Blood pressure 105/67, pulse 94, temperature 97.6 F (36.4 C), resp. rate 16, last menstrual period 02/19/2015, not currently breastfeeding. General appearance: alert, cooperative, appears stated age and mild distress Lungs: clear to auscultation bilaterally Heart: regular rate and rhythm Abdomen: soft, non-tender; bowel sounds normal Extremities: No calf swelling or tenderness Presentation: cephalic GU: moderate amount blood, pooling positive Fetal monitoring: 135/mod/+a/-d Uterine activity: irregular ctxns Dilation: 3 Effacement (%): 50 Station: [employer]  by:: Dr. Ashok PallWouk   Prenatal labs: ABO, Rh: O/POS/-- (05/03 1052) Antibody: NEG (05/03 1052) Rubella: !Error! RPR: NON REAC (09/19 1210)  HBsAg: NEGATIVE (05/03 1052)  HIV: NONREACTIVE (09/19 1210)  GBS:   unknown 1 hr Glucola 91 Genetic screening  Quad neg Anatomy US wnl save for pyelectasis  Prenatal Transfer Tool  Maternal Diabetes: No Genetic Screening: Normal Maternal Ultrasounds/Referrals: Abnormal:  Findings:   Fetal Kidney Anomalies Fetal Ultrasounds or other Referrals:  None Maternal Substance Abuse:  No Significant Maternal Medications:  None Significant Maternal Lab Results: Lab values include: Other: gbs unknown  Results  for orders placed or performed during the hospital encounter of 10/14/15 (from the past 24 hour(s))  Urinalysis, Routine w reflex microscopic (not [facility])   Collection Time: 10/14/15 12:40 PM  Result Value Ref Range   Color, Urine YELLOW YELLOW   APPearance CLEAR CLEAR   Specific Gravity, Urine 1.015 1.005 - 1.030   pH 5.5 5.0 - 8.0   Glucose, UA NEGATIVE NEGATIVE mg/dL   Hgb urine dipstick LARGE (A) NEGATIVE   Bilirubin Urine NEGATIVE NEGATIVE   Ketones, ur 15 (A) NEGATIVE mg/dL   Protein, ur NEGATIVE NEGATIVE mg/dL   Urobilinogen, UA 0.2 0.0 - 1.0 mg/dL   Nitrite NEGATIVE NEGATIVE   Leukocytes, UA NEGATIVE NEGATIVE  Urine microscopic-add on   Collection Time: 10/14/15 12:40 PM  Result Value Ref Range   Squamous Epithelial / LPF FEW (A) RARE   WBC, UA 0-2 <3 WBC/hpf   RBC / HPF 3-6 <3 RBC/hpf   Bacteria, UA FEW (A) RARE   Urine-Other MUCOUS PRESENT   Wet prep, genital   Collection Time: 10/14/15  1:15 PM  Result Value Ref Range   Yeast Wet Prep HPF POC NONE SEEN NONE SEEN   Trich, Wet Prep NONE SEEN NONE SEEN   Clue Cells Wet Prep HPF POC NONE SEEN NONE SEEN   WBC, Wet Prep HPF POC FEW (A) NONE SEEN  Amnisure rupture of membrane (rom)not [facility]   Collection Time: 10/14/15  1:15 PM  Result Value Ref Range   Amnisure ROM POSITIVE     Patient Active Problem List   Diagnosis Date Noted  . Fetal hydronephrosis 10/06/2015  . H/O preterm delivery, currently pregnant 08/31/2015  . Supervision of low-risk pregnancy 06/01/2015  . Asthma affecting pregnancy, antepartum 06/01/2015  . Rectocele, grade 3 03/05/2015    Assessment: Cathy Nash is a 34 y.o. [id number] at 1849w6d here for contractions, leakage of fluid, and vaginal bleeding. Cervix 3 cm dilated from fingertip 2 weeks ago, so this does represent threatened preterm labor. I am concerned for pprom given pooling; ferning is negative; amnisure is pending. Also concerned for abruption given moderate amount of bleeding.  FHR is reassuring, and there is no uterine tenderness.  #Labor: admit to birthing suites for monitoring. Will f/u amnisure. First dose BMZ given, second dose ordered. As patient will be 34 weeks tomorrow, and given concern for pprom, am holding on tocolysis. #Pain: Non-pharmacologic for now #FWB: Cat 1 #ID:  F/u gbs naa; penicillin ordered given prematurity #MOF: breast #MOC: iud #Circ:  Declines #Pyelectasis: noted in prenatal transfer tool #Asthma: asymptomatic  Cathy Nash 10/14/2015, 2:04 PM

## 2015-10-14 NOTE — MAU Note (Signed)
Pt presents to MAU with complaints of leakage of fluid pink in color with contractions that started this morning at 10.

## 2015-10-15 ENCOUNTER — Inpatient Hospital Stay (HOSPITAL_COMMUNITY): Payer: Medicaid Other | Admitting: Anesthesiology

## 2015-10-15 ENCOUNTER — Encounter (HOSPITAL_COMMUNITY): Payer: Self-pay | Admitting: *Deleted

## 2015-10-15 DIAGNOSIS — O42913 Preterm premature rupture of membranes, unspecified as to length of time between rupture and onset of labor, third trimester: Secondary | ICD-10-CM

## 2015-10-15 DIAGNOSIS — Z3A34 34 weeks gestation of pregnancy: Secondary | ICD-10-CM

## 2015-10-15 LAB — RPR: RPR Ser Ql: NONREACTIVE

## 2015-10-15 MED ORDER — FENTANYL 2.5 MCG/ML BUPIVACAINE 1/10 % EPIDURAL INFUSION (WH - ANES)
14.0000 mL/h | INTRAMUSCULAR | Status: DC | PRN
  Administered 2015-10-15: 14 mL/h via EPIDURAL
  Filled 2015-10-15: qty 125

## 2015-10-15 MED ORDER — DIPHENHYDRAMINE HCL 50 MG/ML IJ SOLN: 12.5000 mg | INTRAMUSCULAR | Status: DC | PRN

## 2015-10-15 MED ORDER — PHENYLEPHRINE 40 MCG/ML (10ML) SYRINGE FOR IV PUSH (FOR BLOOD PRESSURE SUPPORT)
80.0000 ug | PREFILLED_SYRINGE | INTRAVENOUS | Status: DC | PRN
  Filled 2015-10-15: qty 2
  Filled 2015-10-15: qty 20

## 2015-10-15 MED ORDER — LIDOCAINE HCL (PF) 1 % IJ SOLN
INTRAMUSCULAR | Status: DC | PRN
  Administered 2015-10-15: 5 mL via EPIDURAL
  Administered 2015-10-15: 2 mL via EPIDURAL
  Administered 2015-10-15: 3 mL via EPIDURAL

## 2015-10-15 MED ORDER — OXYTOCIN 40 UNITS IN LACTATED RINGERS INFUSION - SIMPLE MED
1.0000 m[IU]/min | INTRAVENOUS | Status: DC
  Administered 2015-10-15: 1 m[IU]/min via INTRAVENOUS

## 2015-10-15 MED ORDER — OXYTOCIN 40 UNITS IN LACTATED RINGERS INFUSION - SIMPLE MED
1.0000 m[IU]/min | INTRAVENOUS | Status: DC
  Administered 2015-10-15: 13 m[IU]/min via INTRAVENOUS

## 2015-10-15 MED ORDER — EPHEDRINE 5 MG/ML INJ
10.0000 mg | INTRAVENOUS | Status: DC | PRN
  Filled 2015-10-15: qty 2

## 2015-10-15 MED ORDER — TERBUTALINE SULFATE 1 MG/ML IJ SOLN: 0.2500 mg | Freq: Once | INTRAMUSCULAR | Status: DC | PRN

## 2015-10-15 NOTE — Progress Notes (Signed)
Cathy Nash is a 34 y.o. [phone] at 529w0d by ultrasound admitted for PPROM  Subjective: Pitocin going since midnight; feeling only mild ctx  Objective: BP 111/58 mmHg  Pulse 87  Temp(Src) 98.4 F (36.9 C) (Oral)  Resp 20  Ht 5\' 2"  (1.575 m)  Wt 68.947 kg (152 lb)  BMI 27.79 kg/m2  LMP 02/19/2015 (Approximate)      FHT:  FHR: 120-125 bpm, variability: moderate,  accelerations:  Present,  decelerations:  Absent UC:   irreg SVE:   Dilation: 3 Effacement (%): 50 Station: -3 Exam by:: Dr. Sampson GoonFitzgerald- exam deferred since not feeling painful ctx  Labs: Lab Results  Component Value Date   WBC 6.6 10/14/2015   HGB 10.0* 10/14/2015   HCT 29.9* 10/14/2015   MCV 86.9 10/14/2015   PLT 258 10/14/2015    Assessment / Plan: IUP@34wks  PPROM BMZx1  Increase Pitocin at 2x2 now Plan second BMZ @ 1330 if still preg  Cathy Nash 10/15/2015, 7:50 AM

## 2015-10-15 NOTE — Progress Notes (Signed)
Cathy Nash is a 34 y.o. GBS negative G4P2103 at 40100w0d admitted for PPROM.  Subjective: Patient reports feeling comfortable. Has not noted any significant discomfort or contractions. Reports vaginal bleeding has resolved since yesterday.  Objective:. GEN: Appears comfortable, resting in bed. Family at the bedside. FHT:  125 / moderate variability / frequent acels / no decels UC:   Very infrequent SVE: 3.5 / 80% / -2  Labs: Lab Results  Component Value Date   WBC 6.6 10/14/2015   HGB 10.0* 10/14/2015   HCT 29.9* 10/14/2015   MCV 86.9 10/14/2015   PLT 258 10/14/2015   Assessment / Plan: No significant change made in the last 6 hours - cervical exam unchanged. Category I strip. Pitocin on, rate has been increased 1 x 1. Will increase rate to 2 x 2.  Patient is afebrile and vital WNL, no suggestion of infection at this time. Continue routine L&D care and management.  Gerri Sporeaitlin Naaman Curro, MD 10/15/2015, 7:56 PM

## 2015-10-15 NOTE — Anesthesia Preprocedure Evaluation (Addendum)
Anesthesia Evaluation  Patient identified by MRN, date of birth, ID band Patient awake    Reviewed: Allergy & Precautions, NPO status , Patient's Chart, lab work & pertinent test results  Airway Mallampati: III  TM Distance: >3 FB Neck ROM: Full    Dental  (+) Teeth Intact, Dental Advisory Given   Pulmonary neg pulmonary ROS, asthma , former smoker,    Pulmonary exam normal breath sounds clear to auscultation       Cardiovascular negative cardio ROS Normal cardiovascular exam Rhythm:Regular Rate:Normal     Neuro/Psych PSYCHIATRIC DISORDERS Anxiety negative neurological ROS     GI/Hepatic negative GI ROS, Neg liver ROS,   Endo/Other  negative endocrine ROS  Renal/GU negative Renal ROS     Musculoskeletal negative musculoskeletal ROS (+)   Abdominal   Peds  Hematology  (+) Blood dyscrasia, anemia ,   Anesthesia Other Findings Day of surgery medications reviewed with the patient.  Reproductive/Obstetrics (+) Pregnancy Preterm labor at 34wks; PIH with previous pregnancy                           Anesthesia Physical Anesthesia Plan  ASA: II  Anesthesia Plan: Epidural   Post-op Pain Management:    Induction:   Airway Management Planned:   Additional Equipment:   Intra-op Plan:   Post-operative Plan:   Informed Consent: I have reviewed the patients History and Physical, chart, labs and discussed the procedure including the risks, benefits and alternatives for the proposed anesthesia with the patient or authorized representative who has indicated his/her understanding and acceptance.   Dental advisory given  Plan Discussed with:   Anesthesia Plan Comments: (Patient identified. Risks/Benefits/Options discussed with patient including but not limited to bleeding, infection, nerve damage, paralysis, failed block, incomplete pain control, headache, blood pressure changes, nausea,  vomiting, reactions to medication both or allergic, itching and postpartum back pain. Confirmed with bedside nurse the patient's most recent platelet count. Confirmed with patient that they are not currently taking any anticoagulation, have any bleeding history or any family history of bleeding disorders. Patient expressed understanding and wished to proceed. All questions were answered. )        Anesthesia Quick Evaluation

## 2015-10-15 NOTE — Progress Notes (Signed)
Patient ID: Cathy Nash, female   DOB: 10/09/1981, 34 y.o.   MRN: [id number] Cathy Nash is a 34 y.o. [id number] at 3032w0d.  Subjective: Contractions getting more uncomfortable.   Objective: BP 100/53 mmHg  Pulse 74  Temp(Src) 98.5 F (36.9 C) (Oral)  Resp 20  Ht 5\' 2"  (1.575 m)  Wt 152 lb (68.947 kg)  BMI 27.79 kg/m2  LMP 02/19/2015 (Approximate)   FHT:  FHR: 135 bpm, variability: mod,  accelerations:  15x15,  decelerations:  none UC:   Q 2-4 minutes, moderate  Dilation: 3.5 Effacement (%): 70 Cervical Position: Anterior Station: -2 Presentation: Vertex Exam by:: Ivonne AndrewV. Kinzleigh Kandler CNM  Labs: No results found for this or any previous visit (from the past 24 hour(s)).  Assessment / Plan: 6032w0d week IUP Labor: Early Fetal Wellbeing:  Category I Pain Control:  Comfort measures Anticipated MOD:  SVD Continue increasing Pitocin. IV pain meds, epidural when necessary.  [place] Donika Butner, CNM 10/15/2015 5:48 PM

## 2015-10-15 NOTE — Progress Notes (Signed)
Labor Progress Note  Cathy Nash is a 34 y.o. [id number] at 141w0d  admitted for PPROM, preterm labor.   S: Patient feeling a lot of pressure low in her vagina.    O:  BP 111/60 mmHg  Pulse 81  Temp(Src) 97.7 F (36.5 C) (Oral)  Resp 18  Ht 5\' 2"  (1.575 m)  Wt 68.947 kg (152 lb)  BMI 27.79 kg/m2  LMP 02/19/2015 (Approximate)    FHT:  FHR: 130 bpm, variability: moderate,  accelerations:  Present,  decelerations:  Absent UC:   irregular, every 5 minutes SVE:   Dilation: 3 Effacement (%): 50 Station: -3 Exam by:: Dr. Sampson GoonFitzgerald SROM: pinkish fluid @ ~1000 10/14/15  Labs: Lab Results  Component Value Date   WBC 6.6 10/14/2015   HGB 10.0* 10/14/2015   HCT 29.9* 10/14/2015   MCV 86.9 10/14/2015   PLT 258 10/14/2015    Assessment / Plan: 34 y.o. [id number] 4141w0d in early labor Spontaneous labor, progressing normally  Labor: Starting pitocin, to increase 1x1 Fetal Wellbeing:  Category I Pain Control:  Fentanyl Anticipated MOD:  NSVD  Expectant management  Dani GobbleHillary Storey Stangeland, MD [facility], PGY-1

## 2015-10-15 NOTE — Anesthesia Procedure Notes (Signed)

## 2015-10-16 MED ORDER — BISACODYL 10 MG RE SUPP: 10.0000 mg | Freq: Every day | RECTAL | Status: DC | PRN

## 2015-10-16 MED ORDER — ACETAMINOPHEN 325 MG PO TABS: 650.0000 mg | ORAL_TABLET | ORAL | Status: DC | PRN

## 2015-10-16 MED ORDER — FLEET ENEMA 7-19 GM/118ML RE ENEM: 1.0000 | ENEMA | Freq: Every day | RECTAL | Status: DC | PRN

## 2015-10-16 MED ORDER — OXYCODONE-ACETAMINOPHEN 5-325 MG PO TABS: 2.0000 | ORAL_TABLET | ORAL | Status: DC | PRN

## 2015-10-16 MED ORDER — ONDANSETRON HCL 4 MG PO TABS
4.0000 mg | ORAL_TABLET | ORAL | Status: DC | PRN
Start: 2015-10-16 — End: 2015-10-17

## 2015-10-16 MED ORDER — WITCH HAZEL-GLYCERIN EX PADS: 1.0000 "application " | MEDICATED_PAD | CUTANEOUS | Status: DC | PRN

## 2015-10-16 MED ORDER — BENZOCAINE-MENTHOL 20-0.5 % EX AERO
1.0000 "application " | INHALATION_SPRAY | CUTANEOUS | Status: DC | PRN
  Administered 2015-10-16: 1 via TOPICAL
  Filled 2015-10-16: qty 56

## 2015-10-16 MED ORDER — TETANUS-DIPHTH-ACELL PERTUSSIS 5-2.5-18.5 LF-MCG/0.5 IM SUSP: 0.5000 mL | Freq: Once | INTRAMUSCULAR | Status: DC

## 2015-10-16 MED ORDER — SENNOSIDES-DOCUSATE SODIUM 8.6-50 MG PO TABS
2.0000 | ORAL_TABLET | ORAL | Status: DC
  Administered 2015-10-16 (×2): 2 via ORAL
  Filled 2015-10-16 (×2): qty 2

## 2015-10-16 MED ORDER — FERROUS SULFATE 325 (65 FE) MG PO TABS
325.0000 mg | ORAL_TABLET | Freq: Two times a day (BID) | ORAL | Status: DC
  Administered 2015-10-16 – 2015-10-17 (×3): 325 mg via ORAL
  Filled 2015-10-16 (×3): qty 1

## 2015-10-16 MED ORDER — DIBUCAINE 1 % RE OINT: 1.0000 "application " | TOPICAL_OINTMENT | RECTAL | Status: DC | PRN

## 2015-10-16 MED ORDER — ONDANSETRON HCL 4 MG/2ML IJ SOLN: 4.0000 mg | INTRAMUSCULAR | Status: DC | PRN

## 2015-10-16 MED ORDER — METHYLERGONOVINE MALEATE 0.2 MG PO TABS: 0.2000 mg | ORAL_TABLET | ORAL | Status: DC | PRN

## 2015-10-16 MED ORDER — IBUPROFEN 600 MG PO TABS
600.0000 mg | ORAL_TABLET | Freq: Four times a day (QID) | ORAL | Status: DC
  Administered 2015-10-16 – 2015-10-17 (×7): 600 mg via ORAL
  Filled 2015-10-16 (×7): qty 1

## 2015-10-16 MED ORDER — OXYCODONE-ACETAMINOPHEN 5-325 MG PO TABS
1.0000 | ORAL_TABLET | ORAL | Status: DC | PRN
  Administered 2015-10-16: 1 via ORAL
  Filled 2015-10-16: qty 1

## 2015-10-16 MED ORDER — OXYTOCIN 40 UNITS IN LACTATED RINGERS INFUSION - SIMPLE MED: 62.5000 mL/h | INTRAVENOUS | Status: DC | PRN

## 2015-10-16 MED ORDER — DIPHENHYDRAMINE HCL 25 MG PO CAPS: 25.0000 mg | ORAL_CAPSULE | Freq: Four times a day (QID) | ORAL | Status: DC | PRN

## 2015-10-16 MED ORDER — SIMETHICONE 80 MG PO CHEW: 80.0000 mg | CHEWABLE_TABLET | ORAL | Status: DC | PRN

## 2015-10-16 MED ORDER — LANOLIN HYDROUS EX OINT: TOPICAL_OINTMENT | CUTANEOUS | Status: DC | PRN

## 2015-10-16 MED ORDER — ZOLPIDEM TARTRATE 5 MG PO TABS: 5.0000 mg | ORAL_TABLET | Freq: Every evening | ORAL | Status: DC | PRN

## 2015-10-16 MED ORDER — PRENATAL MULTIVITAMIN CH
1.0000 | ORAL_TABLET | Freq: Every day | ORAL | Status: DC
  Administered 2015-10-16 – 2015-10-17 (×2): 1 via ORAL
  Filled 2015-10-16 (×2): qty 1

## 2015-10-16 MED ORDER — METHYLERGONOVINE MALEATE 0.2 MG/ML IJ SOLN: 0.2000 mg | INTRAMUSCULAR | Status: DC | PRN

## 2015-10-16 MED ORDER — MEASLES, MUMPS & RUBELLA VAC ~~LOC~~ INJ
0.5000 mL | INJECTION | Freq: Once | SUBCUTANEOUS | Status: DC
  Filled 2015-10-16: qty 0.5

## 2015-10-16 NOTE — Progress Notes (Signed)
Cathy Nash is a 34 y.o. [id number] who is PPD # 1 s/p NSVD at 677w0d following PPROM.  Subjective: Patient reports doing well this morning. Pain is controlled. Ambulating to bathroom without issues, reports normal voiding. Tolerating regular diet. Reports mild lochia with minimal bleeding, which is decreasing. Pertinent negatives on ROS include no blurry vision, no headache, no dyspnea, no chest pain, no epigastric or RUQ pain, no nausea or vomiting, no hematuria, no dysuria.  Objective: Filed Vitals:   10/15/15 2346 10/16/15 0006 10/16/15 0053 10/16/15 0604  BP: 108/59 105/56 91/54 105/55  Pulse: 69 65 73 67  Temp:  97.9 F (36.6 C) 98.3 F (36.8 C) 98.5 F (36.9 C)  Resp: 16 18 18 18   SpO2:  97% 97% 96%    GEN: alert, comfortable-appearing woman resting in hospital bed. PULM: CTAB CV: RRR, S1 and S2 heard, grade I/VI systolic flow murmur appreciated. No rubs or gallops. ABD: Fundus firm at umbilicus. Abdomen appropriately TTP. No epigastric or RUQ pain. No guarding. GU: Lochia appropriate  Assessment / Plan: Cathy Nash is a 34 y.o. G4 now P2204 who is PPD # 1s/p NSVD with augmentation following PPROM at 707w0d  -- Baby in the NICU but doing well -- Pain controlled on current regimen -- Hemodynamically stable -- Tolerating regular diet -- Ambulating and voiding without issue -- no BM yet, +flatus -- Feeding method: breast (pumping), lactation consult ordered at patient's request -- Planned contraception method: IUD  Diet: Regular PPx: No DVT ppx at this time - patient is low risk and ambulating ad lib Code Status: FULL Dispo: Continue routine post-partum inpatient care for at least 24 hrs. Anticipate d/c tomorrow if continues to do well.  Gerri Sporeaitlin Keelan Pomerleau, MD 10/16/2015 7:50 AM

## 2015-10-16 NOTE — Progress Notes (Signed)
Interpreter, Desmond DikeMaday Witmer, at bedside to assist with admission teaching.

## 2015-10-16 NOTE — Progress Notes (Signed)
CSW attempted to meet with MOB to introduce services, offer support and complete assessment due to NICU admission.  CSW asked if MOB would prefer to meet with CSW with the assistance of a Spanish Interpreter and she said yes.  She reports that she is feeling well.  CSW called Spanish Interpreter who states she is not available at this time.  CSW asked that Spanish Interpreter call CSW when she is available.   

## 2015-10-16 NOTE — Anesthesia Postprocedure Evaluation (Signed)
  Anesthesia Post-op Note  Patient: Cathy Nash  Procedure(s) Performed: * No procedures listed *  Patient Location: Women's Unit  Anesthesia Type:Epidural  Level of Consciousness: awake, alert  and oriented  Airway and Oxygen Therapy: Patient Spontanous Breathing  Post-op Pain: none  Post-op Assessment: Post-op Vital signs reviewed, Patient's Cardiovascular Status Stable, Respiratory Function Stable, Patent Airway, No signs of Nausea or vomiting, Adequate PO intake, Pain level controlled, No headache, No backache and Patient able to bend at knees              Post-op Vital Signs: Reviewed and stable  Last Vitals:  Filed Vitals:   10/16/15 0604  BP: 105/55  Pulse: 67  Temp: 36.9 C  Resp: 18    Complications: No apparent anesthesia complications

## 2015-10-16 NOTE — Lactation Note (Signed)
This note was copied from the chart of Cathy Nash. Lactation Consultation Note  Patient Name: Cathy [name] XBMWU'XToday's Date: 10/16/2015 Reason for consult: Initial assessment;NICU baby;Late preterm infant;Infant < 6lbs   With this mom of a NICU baby, now 514 hours old, and 34 1/[redacted] weeks gestation. Mom has been pumping, but not expressin any colostrum. I explained to mom that this is normal, and that hand expression is needed for the first 2 days especially, to express thick colostrum. I showed mom how to hand express,and she was able to collect a few drops to bring to her baby. NICU booklet on pumping briefly reviewed with mom. Mom is active with WIC, fax sent, and [facility] given to mom ofr discharge to home , tomorrow.    Maternal Data Formula Feeding for Exclusion: Yes (baby in NICU) Has patient been taught Hand Expression?: Yes Does the patient have breastfeeding experience prior to this delivery?: Yes  Feeding Feeding Type: Formula Length of feed: 30 min  LATCH Score/Interventions                      Lactation Tools Discussed/Used WIC Program: Yes (faxe sent for DEP) Pump Review: Setup, frequency, and cleaning;Milk Storage;Other (comment) (premie setting, hand expression, NICU booklet) Initiated by:: bedside RN Date initiated:: 10/16/15   Consult Status Consult Status: Follow-up Date: 10/17/15 Follow-up type: In-patient    Alfred LevinsLee, Tanmay Halteman Anne 10/16/2015, 3:44 PM

## 2015-10-17 MED ORDER — IBUPROFEN 600 MG PO TABS: 600.0000 mg | ORAL_TABLET | Freq: Four times a day (QID) | ORAL | Status: DC | PRN

## 2015-10-17 NOTE — Discharge Instructions (Signed)

## 2015-10-17 NOTE — Lactation Note (Signed)
This note was copied from the chart of Cathy Nash. Lactation Consultation Note  Patient Name: Cathy Nash NWGNF'AToday's Date: 10/17/2015 Reason for consult: Follow-up assessment;NICU baby;Late preterm infant;Infant < 6lbs   Follow-up at 5136 hrs old with NICU mom.  Mom being discharged today.  [facility]. Mom reports pumping 3-4 times per day.  Explained to mom she needed to pump 8 times per day plus at least once during the night.  Pumping Log in NICU booklet started for mom and encouraged mom to keep pumping log in booklet.  Mom reports pumping 5 ml colostrum.  LC explained how to use hands-on pumping with hand expression at end of pumping session to maximize milk output.  HE website in NICU booklet shown to mom and encouraged her to watch for review.  Reviewed milk transport to hospital.  Encouraged mom to take all pump pieces out of pump with her when she goes home for [facility].  Parents verbalized understanding of all teaching.  NICU booklet (in Spanish) used with review and teaching.   WIC Loaner due back 10/29/15.  Parents denied having any questions.     Maternal Data    Feeding    LATCH Score/Interventions                      Lactation Tools Discussed/Used     Consult Status Consult Status: Complete    Lendon KaVann, Rudy Luhmann Walker 10/17/2015, 11:34 AM

## 2015-10-17 NOTE — Discharge Summary (Signed)
OB Discharge Summary     Patient Name: Cathy Nash DOB: 09/14/1981 MRN: [id number]  Date of admission: 10/14/2015 Delivering MD: Jacklyn ShellRESENZO-DISHMON, FRANCES   Date of discharge: 10/17/2015  Admitting diagnosis: 33.6wks; PPROM Intrauterine pregnancy: 4948w0d @ delivery Secondary diagnosis:  Active Problems:   Threatened preterm labor  Additional problems: none     Discharge diagnosis: Preterm Pregnancy Delivered                                                                                                Post partum procedures:none  Augmentation: Pitocin  Complications: None  Hospital course:  Induction of Labor With Vaginal Delivery   34 y.o. yo [id number] at 6048w0d was admitted to the hospital 10/14/2015 for induction of labor.  Indication for induction: PPROM. She was given BMZ x 2 prior to delivery. Patient had an uncomplicated labor course as follows: Membrane Rupture Time/Date: 10:00 AM ,10/14/2015   Intrapartum Procedures: Episiotomy: None [1]                                         Lacerations:  None [1]  Patient had delivery of a Viable infant.  Information for the patient's newborn:  Cathy Nash, Boy Cathy [[id number]][[id number]]  Delivery Method: Vaginal, Spontaneous Delivery (Filed from Delivery Summary)   10/15/2015  Details of delivery can be found in separate delivery note.  Patient had a routine postpartum course. Patient is discharged home 10/17/2015 12:15 PM    Physical exam  Filed Vitals:   10/16/15 1300 10/16/15 1818 10/16/15 2110 10/17/15 0532  BP: 107/63 109/65 107/43 93/61  Pulse: 98 70 84 51  Temp: 98.6 F (37 C) 97.8 F (36.6 C) 98 F (36.7 C) 98.2 F (36.8 C)  TempSrc: Oral  Oral Oral  Resp: 18 16 18 16   Height:      Weight:      SpO2: 98% 99% 100% 98%   General: alert and cooperative Lochia: appropriate Uterine Fundus: firm Incision: N/A DVT Evaluation: No evidence of DVT seen on physical exam. Labs: Lab Results  Component Value Date   WBC 6.6 10/14/2015   HGB 10.0* 10/14/2015   HCT 29.9* 10/14/2015   MCV 86.9 10/14/2015   PLT 258 10/14/2015   CMP Latest Ref Rng 09/28/2015  Glucose 65 - 99 mg/dL 295(J124(H)  BUN 6 - 20 mg/dL 6  Creatinine 8.840.44 - 1.661.00 mg/dL 0.63(K0.43(L)  Sodium 160135 - 109145 mmol/L 135  Potassium 3.5 - 5.1 mmol/L 3.1(L)  Chloride 101 - 111 mmol/L 110  CO2 22 - 32 mmol/L 20(L)  Calcium 8.9 - 10.3 mg/dL 8.2(L)  Total Protein 6.5 - 8.1 g/dL 6.8  Total Bilirubin 0.3 - 1.2 mg/dL 0.5  Alkaline Phos 38 - 126 U/L 110  AST 15 - 41 U/L 21  ALT 14 - 54 U/L 11(L)    Discharge instruction: per After Visit Summary and "Baby and Me Booklet".  Medications:MEDICATIONS: See list below After visit meds:    Medication List    STOP taking these  medications        acetaminophen 500 MG tablet  Commonly known as:  TYLENOL     albuterol 108 (90 BASE) MCG/ACT inhaler  Commonly known as:  PROVENTIL HFA;VENTOLIN HFA      TAKE these medications        ibuprofen 600 MG tablet  Commonly known as:  ADVIL,MOTRIN  Take 1 tablet (600 mg total) by mouth every 6 (six) hours as needed.     multivitamin-prenatal 27-0.8 MG Tabs tablet  Take 1 tablet by mouth daily at 12 noon.        Diet: routine diet  Activity: Advance as tolerated. Pelvic rest for 6 weeks.   Outpatient follow up:6 weeks Follow up Appt:Future Appointments Date Time Provider Department Center  11/20/2015 8:30 AM Adam Phenix, MD [facility]   Follow up Visit:No Follow-up on file.  Postpartum contraception: IUD Mirena  Newborn Data: Live born female  Birth Weight: 4 lb 14.3 oz (2220 g) APGAR: 8, 8  Baby Feeding: Breast Disposition:NICU   10/17/2015 Cam Hai, CNM  10/17/2015

## 2015-10-22 ENCOUNTER — Encounter: Payer: Self-pay | Admitting: Obstetrics & Gynecology

## 2015-11-20 ENCOUNTER — Ambulatory Visit: Payer: Self-pay | Admitting: Obstetrics & Gynecology

## 2016-03-17 ENCOUNTER — Emergency Department (HOSPITAL_COMMUNITY): Payer: Self-pay

## 2016-03-17 ENCOUNTER — Encounter (HOSPITAL_COMMUNITY): Payer: Self-pay | Admitting: Emergency Medicine

## 2016-03-17 ENCOUNTER — Emergency Department (HOSPITAL_COMMUNITY)
Admission: EM | Admit: 2016-03-17 | Discharge: 2016-03-17 | Disposition: A | Discharge status: Home / Self Care | Payer: Self-pay | Attending: Emergency Medicine | Admitting: Emergency Medicine

## 2016-03-17 DIAGNOSIS — R1032 Left lower quadrant pain: Secondary | ICD-10-CM | POA: Insufficient documentation

## 2016-03-17 DIAGNOSIS — Z3A01 Less than 8 weeks gestation of pregnancy: Secondary | ICD-10-CM | POA: Insufficient documentation

## 2016-03-17 DIAGNOSIS — Z8679 Personal history of other diseases of the circulatory system: Secondary | ICD-10-CM | POA: Insufficient documentation

## 2016-03-17 DIAGNOSIS — O99511 Diseases of the respiratory system complicating pregnancy, first trimester: Secondary | ICD-10-CM | POA: Insufficient documentation

## 2016-03-17 DIAGNOSIS — R102 Pelvic and perineal pain: Secondary | ICD-10-CM | POA: Insufficient documentation

## 2016-03-17 DIAGNOSIS — O9989 Other specified diseases and conditions complicating pregnancy, childbirth and the puerperium: Secondary | ICD-10-CM | POA: Insufficient documentation

## 2016-03-17 DIAGNOSIS — O26899 Other specified pregnancy related conditions, unspecified trimester: Secondary | ICD-10-CM

## 2016-03-17 DIAGNOSIS — Z87891 Personal history of nicotine dependence: Secondary | ICD-10-CM | POA: Insufficient documentation

## 2016-03-17 DIAGNOSIS — J45909 Unspecified asthma, uncomplicated: Secondary | ICD-10-CM | POA: Insufficient documentation

## 2016-03-17 DIAGNOSIS — R109 Unspecified abdominal pain: Secondary | ICD-10-CM

## 2016-03-17 DIAGNOSIS — Z79899 Other long term (current) drug therapy: Secondary | ICD-10-CM | POA: Insufficient documentation

## 2016-03-17 DIAGNOSIS — Z87448 Personal history of other diseases of urinary system: Secondary | ICD-10-CM | POA: Insufficient documentation

## 2016-03-17 DIAGNOSIS — O219 Vomiting of pregnancy, unspecified: Secondary | ICD-10-CM | POA: Insufficient documentation

## 2016-03-17 DIAGNOSIS — Z8659 Personal history of other mental and behavioral disorders: Secondary | ICD-10-CM | POA: Insufficient documentation

## 2016-03-17 LAB — URINALYSIS, ROUTINE W REFLEX MICROSCOPIC
BILIRUBIN URINE: NEGATIVE
GLUCOSE, UA: NEGATIVE mg/dL
KETONES UR: 40 mg/dL — AB
Nitrite: NEGATIVE
PH: 6 (ref 5.0–8.0)
PROTEIN: NEGATIVE mg/dL
Specific Gravity, Urine: 1.011 (ref 1.005–1.030)

## 2016-03-17 LAB — COMPREHENSIVE METABOLIC PANEL
ALBUMIN: 3.7 g/dL (ref 3.5–5.0)
ALT: 16 U/L (ref 14–54)
AST: 21 U/L (ref 15–41)
Alkaline Phosphatase: 79 U/L (ref 38–126)
Anion gap: 11 (ref 5–15)
BILIRUBIN TOTAL: 0.4 mg/dL (ref 0.3–1.2)
BUN: 6 mg/dL (ref 6–20)
CHLORIDE: 105 mmol/L (ref 101–111)
CO2: 21 mmol/L — ABNORMAL LOW (ref 22–32)
CREATININE: 0.51 mg/dL (ref 0.44–1.00)
Calcium: 9 mg/dL (ref 8.9–10.3)
GFR calc Af Amer: >60 mL/min (ref >60)
GLUCOSE: 96 mg/dL (ref 65–99)
POTASSIUM: 3.7 mmol/L (ref 3.5–5.1)
Sodium: 137 mmol/L (ref 135–145)
TOTAL PROTEIN: 6.9 g/dL (ref 6.5–8.1)

## 2016-03-17 LAB — WET PREP, GENITAL
Clue Cells Wet Prep HPF POC: NONE SEEN
SPERM: NONE SEEN
Trich, Wet Prep: NONE SEEN
Yeast Wet Prep HPF POC: NONE SEEN

## 2016-03-17 LAB — CBC
HEMATOCRIT: 36.3 % (ref 36.0–46.0)
Hemoglobin: 12.1 g/dL (ref 12.0–15.0)
MCH: 29.1 pg (ref 26.0–34.0)
MCHC: 33.3 g/dL (ref 30.0–36.0)
MCV: 87.3 fL (ref 78.0–100.0)
PLATELETS: 307 10*3/uL (ref 150–400)
RBC: 4.16 MIL/uL (ref 3.87–5.11)
RDW: 13.6 % (ref 11.5–15.5)
WBC: 6.2 10*3/uL (ref 4.0–10.5)

## 2016-03-17 LAB — URINE MICROSCOPIC-ADD ON

## 2016-03-17 LAB — LIPASE, BLOOD: Lipase: 33 U/L (ref 11–51)

## 2016-03-17 LAB — I-STAT BETA HCG BLOOD, ED (MC, WL, AP ONLY): I-stat hCG, quantitative: >2000 m[IU]/mL — ABNORMAL HIGH (ref <5)

## 2016-03-17 MED ORDER — METOCLOPRAMIDE HCL 10 MG PO TABS: 10.0000 mg | ORAL_TABLET | Freq: Three times a day (TID) | ORAL | Status: DC | PRN

## 2016-03-17 MED ORDER — SODIUM CHLORIDE 0.9 % IV BOLUS (SEPSIS)
1000.0000 mL | Freq: Once | INTRAVENOUS | Status: AC
  Administered 2016-03-17: 1000 mL via INTRAVENOUS

## 2016-03-17 MED ORDER — METOCLOPRAMIDE HCL 5 MG/ML IJ SOLN
10.0000 mg | Freq: Once | INTRAMUSCULAR | Status: AC
  Administered 2016-03-17: 10 mg via INTRAVENOUS
  Filled 2016-03-17: qty 2

## 2016-03-17 NOTE — ED Notes (Signed)
Pt reports lower abdominal pain, and vaginal bleeding. Onset yesterday. Reports 8/10 pain upon arrival to ED.

## 2016-03-17 NOTE — ED Provider Notes (Signed)
CSN: [id number]     Arrival date & time 03/17/16  1143 History  By signing my name below, I, Cathy Nash, attest that this documentation has been prepared under the direction and in the presence of Kerrie Buffalo, NP. Electronically Signed: Tanda Nash, ED Scribe. 03/17/2016. 1:48 PM.   Chief Complaint  Patient presents with  . Abdominal Pain   Patient is a 35 y.o. female presenting with abdominal pain. The history is provided by the patient. No language interpreter was used.  Abdominal Pain Pain location:  Suprapubic Pain quality: sharp   Pain radiates to:  Does not radiate Pain severity:  Moderate Onset quality:  Gradual Duration:  3 days Timing:  Constant Progression:  Unchanged Chronicity:  New Associated symptoms: chills and nausea   Associated symptoms: no fever, no vaginal bleeding and no vomiting      HPI Comments: Cathy Nash is a 35 y.o. female who presents to the Emergency Department complaining of gradual onset, constant, sharp, 5/10, pelvic pain x 3 days. Pt also complains of nausea, chills, breast tenderness, hematuria, and dysuria. Pt does not believe she is pregnant at this time but her LNMP was 2 months ago. She is on birth control but admits to stopping due to the pills giving her headaches. The last time she had a dose was about 1 month ago. G5P4A0. Denies vomiting, fever, or any other associated symptoms. Patient does report seeing blood a couple times when she voided but was unsure if vaginal or in urine. Patient blood type O pos.  Past Medical History  Diagnosis Date  . Asthma   . Asthma   . Preterm labor     1st pregnancy d/t pre-e  . Pregnancy induced hypertension     previous pregnancy  . Pyelonephritis     June 2013  . Varicose veins   . Vaginal pessary in situ   . Anxiety    Past Surgical History  Procedure Laterality Date  . No past surgeries     Family History  Problem Relation Age of Onset  . Anesthesia problems Neg Hx   .  Hypotension Neg Hx   . Malignant hyperthermia Neg Hx   . Pseudochol deficiency Neg Hx   . Other Neg Hx   . Diabetes Mother   . Asthma Father    Social History  Substance Use Topics  . Smoking status: Former Games developer  . Smokeless tobacco: Never Used     Comment: yrs ago  . Alcohol Use: No   OB History    Gravida Para Term Preterm AB TAB SAB Ectopic Multiple Living   0 0 0 0 0 4     Review of Systems  Constitutional: Positive for chills. Negative for fever.  Gastrointestinal: Positive for nausea and abdominal pain. Negative for vomiting.  Genitourinary: Positive for pelvic pain. Negative for vaginal bleeding.  All other systems reviewed and are negative.  Allergies  Iohexol  Home Medications   Prior to Admission medications   Medication Sig Start Date End Date Taking? Authorizing Provider  ibuprofen (ADVIL,MOTRIN) 600 MG tablet Take 1 tablet (600 mg total) by mouth every 6 (six) hours as needed. 10/17/15   Arabella Merles, CNM  metoCLOPramide (REGLAN) 10 MG tablet Take 1 tablet (10 mg total) by mouth every 8 (eight) hours as needed for nausea. 03/17/16   Zaul Hubers Orlene Och, NP  Prenatal Vit-Fe Fumarate-FA (MULTIVITAMIN-PRENATAL) 27-0.8 MG TABS tablet Take 1 tablet by mouth daily at 12 noon.  Historical Provider, MD   BP 110/62 mmHg  Pulse 78  Temp(Src) 97.9 F (36.6 C) (Oral)  Resp 16  Ht 5\' 2"  (1.575 m)  Wt 62.71 kg  BMI 25.28 kg/m2  SpO2 99%  LMP 01/18/2016 (Approximate)   Physical Exam  Constitutional: She is oriented to person, place, and time. She appears well-developed and well-nourished. No distress.  HENT:  Head: Normocephalic and atraumatic.  Eyes: Conjunctivae and EOM are normal.  Neck: Neck supple. No tracheal deviation present.  Cardiovascular: Normal rate, regular rhythm and normal heart sounds.   Pulmonary/Chest: Effort normal and breath sounds normal. No respiratory distress. She has no wheezes. She has no rales.  Abdominal: Soft. Bowel sounds are  normal. There is tenderness. There is no rebound, no guarding and no CVA tenderness.  Tenderness in lower abdomen with increased tenderness in the LLQ.   Genitourinary:  External genitalia without lesions, mucous d/c vaginal vault, no blood noted, cervix closed, positive CMT, right adnexal tenderness, tenderness is mild, uterus slightly enlarged.   Musculoskeletal: Normal range of motion.  Neurological: She is alert and oriented to person, place, and time.  Skin: Skin is warm and dry.  Psychiatric: She has a normal mood and affect. Her behavior is normal.  Nursing note and vitals reviewed.   ED Course  Procedures (including critical care time)  DIAGNOSTIC STUDIES: Oxygen Saturation is 100% on RA, normal by my interpretation.    COORDINATION OF CARE: 1:41 PM-Discussed treatment plan which includes pelvic exam with pt at bedside and pt agreed to plan.   Labs Review Results for orders placed or performed during the hospital encounter of 03/17/16 (from the past 24 hour(s))  Lipase, blood     Status: None   Collection Time: 03/17/16 12:21 PM  Result Value Ref Range   Lipase 33 11 - 51 U/L  Comprehensive metabolic panel     Status: Abnormal   Collection Time: 03/17/16 12:21 PM  Result Value Ref Range   Sodium 137 135 - 145 mmol/L   Potassium 3.7 3.5 - 5.1 mmol/L   Chloride 105 101 - 111 mmol/L   CO2 21 (L) 22 - 32 mmol/L   Glucose, Bld 96 65 - 99 mg/dL   BUN 6 6 - 20 mg/dL   Creatinine, Ser 2.130.51 0.44 - 1.00 mg/dL   Calcium 9.0 8.9 - 08.610.3 mg/dL   Total Protein 6.9 6.5 - 8.1 g/dL   Albumin 3.7 3.5 - 5.0 g/dL   AST 21 15 - 41 U/L   ALT 16 14 - 54 U/L   Alkaline Phosphatase 79 38 - 126 U/L   Total Bilirubin 0.4 0.3 - 1.2 mg/dL   GFR calc non Af Amer >60 >60 mL/min   GFR calc Af Amer >60 >60 mL/min   Anion gap 11 5 - 15  CBC     Status: None   Collection Time: 03/17/16 12:21 PM  Result Value Ref Range   WBC 6.2 4.0 - 10.5 K/uL   RBC 4.16 3.87 - 5.11 MIL/uL   Hemoglobin 12.1  12.0 - 15.0 g/dL   HCT 57.836.3 46.936.0 - 62.946.0 %   MCV 87.3 78.0 - 100.0 fL   MCH 29.1 26.0 - 34.0 pg   MCHC 33.3 30.0 - 36.0 g/dL   RDW 52.813.6 41.311.5 - 24.415.5 %   Platelets 307 150 - 400 K/uL  I-Stat beta hCG blood, ED ([facility], [facility], [facility] only)     Status: Abnormal   Collection Time: 03/17/16 12:48 PM  Result Value Ref Range   I-stat hCG, quantitative >2000.0 (H) <5 mIU/mL   Comment 3          Urinalysis, Routine w reflex microscopic (not at [facility])     Status: Abnormal   Collection Time: 03/17/16  1:30 PM  Result Value Ref Range   Color, Urine YELLOW YELLOW   APPearance CLEAR CLEAR   Specific Gravity, Urine 1.011 1.005 - 1.030   pH 6.0 5.0 - 8.0   Glucose, UA NEGATIVE NEGATIVE mg/dL   Hgb urine dipstick TRACE (A) NEGATIVE   Bilirubin Urine NEGATIVE NEGATIVE   Ketones, ur 40 (A) NEGATIVE mg/dL   Protein, ur NEGATIVE NEGATIVE mg/dL   Nitrite NEGATIVE NEGATIVE   Leukocytes, UA TRACE (A) NEGATIVE  Urine microscopic-add on     Status: Abnormal   Collection Time: 03/17/16  1:30 PM  Result Value Ref Range   Squamous Epithelial / LPF 0-5 (A) NONE SEEN   WBC, UA 0-5 0 - 5 WBC/hpf   RBC / HPF 0-5 0 - 5 RBC/hpf   Bacteria, UA RARE (A) NONE SEEN  Wet prep, genital     Status: Abnormal   Collection Time: 03/17/16  2:04 PM  Result Value Ref Range   Yeast Wet Prep HPF POC NONE SEEN NONE SEEN   Trich, Wet Prep NONE SEEN NONE SEEN   Clue Cells Wet Prep HPF POC NONE SEEN NONE SEEN   WBC, Wet Prep HPF POC MANY (A) NONE SEEN   Sperm NONE SEEN      Imaging Review US Ob Comp Less 14 Wks  03/17/2016  CLINICAL DATA:  Pregnant with pelvic pain. EXAM: OBSTETRIC <14 WK [place] AND TRANSVAGINAL OB US TECHNIQUE: Both transabdominal and transvaginal ultrasound examinations were performed for complete evaluation of the gestation as well as the maternal uterus, adnexal regions, and pelvic cul-de-sac. Transvaginal technique was performed to assess early pregnancy. COMPARISON:  10/05/2015 FINDINGS: Intrauterine gestational sac:  Visualized/normal in shape. Yolk sac:  Present Embryo:  Present Cardiac Activity: Present Heart Rate: 116  bpm CRL:  8.8  mm   6 w   6 d                  [place] EDC: 11/04/2016 Subchorionic hemorrhage:  Moderate Maternal uterus/adnexae: Normal ovaries.  Corpus luteum cyst noted on the right. IMPRESSION: 1. Living intrauterine embryo estimated at 6 weeks and 6 days gestation. 2. Moderate subchorionic hemorrhage. 3. Normal ovaries. Electronically Signed   By: Rudie Meyer M.D.   On: 03/17/2016 15:24   US Ob Transvaginal  03/17/2016  CLINICAL DATA:  Pregnant with pelvic pain. EXAM: OBSTETRIC <14 WK [place] AND TRANSVAGINAL OB US TECHNIQUE: Both transabdominal and transvaginal ultrasound examinations were performed for complete evaluation of the gestation as well as the maternal uterus, adnexal regions, and pelvic cul-de-sac. Transvaginal technique was performed to assess early pregnancy. COMPARISON:  10/05/2015 FINDINGS: Intrauterine gestational sac: Visualized/normal in shape. Yolk sac:  Present Embryo:  Present Cardiac Activity: Present Heart Rate: 116  bpm CRL:  8.8  mm   6 w   6 d                  [place] EDC: 11/04/2016 Subchorionic hemorrhage:  Moderate Maternal uterus/adnexae: Normal ovaries.  Corpus luteum cyst noted on the right. IMPRESSION: 1. Living intrauterine embryo estimated at 6 weeks and 6 days gestation. 2. Moderate subchorionic hemorrhage. 3. Normal ovaries. Electronically Signed   By: Rudie Meyer M.D.   On: 03/17/2016 15:24  I have personally reviewed and evaluated these lab results as part of my medical decision-making.   MDM  35 y.o. female with lower abdominal pain and nausea in early pregnancy stable for d/c with pain improved after IV hydration and mediation for nausea. Discussed with the patient clinical, lab and ultrasound findings and plan of care. All questioned fully answered. She will start her OB care and [facility] problems arise.   Final diagnoses:  Abdominal pain during  pregnancy, antepartum  Nausea and vomiting during pregnancy prior to [redacted] weeks gestation    I personally performed the services described in this documentation, which was scribed in my presence. The recorded information has been reviewed and is accurate.      [place], [place] 03/17/16 1655  Vanetta Mulders, MD 03/17/16 1721

## 2016-03-17 NOTE — ED Notes (Signed)
Pt reports lower abd x 2 days. Pt also reports breast tenderness, nausea and hematuria. Pt alert x4. NAD at this time. Last menstrual cycle 2 months ago.

## 2016-03-17 NOTE — ED Notes (Signed)
Pt transported to ultrasound.

## 2016-03-17 NOTE — ED Notes (Signed)
Pelvic cart set up at bedside  

## 2016-03-18 LAB — GC/CHLAMYDIA PROBE AMP (~~LOC~~) NOT AT ARMC
CHLAMYDIA, DNA PROBE: NEGATIVE
NEISSERIA GONORRHEA: NEGATIVE

## 2016-03-24 ENCOUNTER — Inpatient Hospital Stay (HOSPITAL_COMMUNITY): Payer: Self-pay

## 2016-03-24 ENCOUNTER — Inpatient Hospital Stay (HOSPITAL_COMMUNITY)
Admission: AD | Admit: 2016-03-24 | Discharge: 2016-03-24 | Disposition: A | Discharge status: Home / Self Care | Payer: Self-pay | Source: Ambulatory Visit | Attending: Obstetrics & Gynecology | Admitting: Obstetrics & Gynecology

## 2016-03-24 ENCOUNTER — Encounter (HOSPITAL_COMMUNITY): Payer: Self-pay | Admitting: *Deleted

## 2016-03-24 DIAGNOSIS — Z79899 Other long term (current) drug therapy: Secondary | ICD-10-CM | POA: Insufficient documentation

## 2016-03-24 DIAGNOSIS — F419 Anxiety disorder, unspecified: Secondary | ICD-10-CM | POA: Insufficient documentation

## 2016-03-24 DIAGNOSIS — O418X10 Other specified disorders of amniotic fluid and membranes, first trimester, not applicable or unspecified: Secondary | ICD-10-CM

## 2016-03-24 DIAGNOSIS — J45909 Unspecified asthma, uncomplicated: Secondary | ICD-10-CM | POA: Insufficient documentation

## 2016-03-24 DIAGNOSIS — O209 Hemorrhage in early pregnancy, unspecified: Secondary | ICD-10-CM | POA: Insufficient documentation

## 2016-03-24 DIAGNOSIS — Z3A01 Less than 8 weeks gestation of pregnancy: Secondary | ICD-10-CM | POA: Insufficient documentation

## 2016-03-24 DIAGNOSIS — Z87891 Personal history of nicotine dependence: Secondary | ICD-10-CM | POA: Insufficient documentation

## 2016-03-24 DIAGNOSIS — O468X1 Other antepartum hemorrhage, first trimester: Secondary | ICD-10-CM

## 2016-03-24 LAB — URINALYSIS, ROUTINE W REFLEX MICROSCOPIC
Bilirubin Urine: NEGATIVE
Glucose, UA: NEGATIVE mg/dL
Ketones, ur: 15 mg/dL — AB
Nitrite: NEGATIVE
Protein, ur: NEGATIVE mg/dL
Specific Gravity, Urine: 1.025 (ref 1.005–1.030)
pH: 6 (ref 5.0–8.0)

## 2016-03-24 LAB — URINE MICROSCOPIC-ADD ON

## 2016-03-24 MED ORDER — PROMETHAZINE HCL 25 MG PO TABS: 25.0000 mg | ORAL_TABLET | Freq: Four times a day (QID) | ORAL | Status: DC | PRN

## 2016-03-24 NOTE — MAU Note (Signed)
Pt stated  She started having vaginal bleeding and cramping a few days ago. Had a postive pregnancy test on 4/6 confermed at the ED

## 2016-03-24 NOTE — MAU Provider Note (Signed)
History     CSN: [id number]  Arrival date and time: 03/24/16 1623   First Provider Initiated Contact with Patient 03/24/16 1751      Chief Complaint  Patient presents with  . Vaginal Bleeding  . Abdominal Pain   HPI  Pt is 35 yo [id number] at [redacted]w[redacted]d pregnant who presents with vaginal bleeding and cramps.  Pt had an episode of bleeding 1 week ago when she was in MAU on 03/17/2016 and had confirmed SLIUP at [redacted]w[redacted]d.  Pt had onset of abd pain yesterday with increase in pain and bleeding this morning.  Pt states her bleeding is spotting.  The pain is constant, not intermittent.  Pt denies pain with urination, constipation or diarrhea. Pt is nauseated and vomited once last week.  Pt is not taking any medications.  Pt has hx of pre-eclampsia  Past Medical History  Diagnosis Date  . Asthma   . Asthma   . Preterm labor     1st pregnancy d/t pre-e  . Pregnancy induced hypertension     previous pregnancy  . Pyelonephritis     June 2013  . Varicose veins   . Vaginal pessary in situ   . Anxiety     Past Surgical History  Procedure Laterality Date  . No past surgeries      Family History  Problem Relation Age of Onset  . Anesthesia problems Neg Hx   . Hypotension Neg Hx   . Malignant hyperthermia Neg Hx   . Pseudochol deficiency Neg Hx   . Other Neg Hx   . Diabetes Mother   . Asthma Father     Social History  Substance Use Topics  . Smoking status: Former Games developer  . Smokeless tobacco: Never Used     Comment: yrs ago  . Alcohol Use: No    Allergies:  Allergies  Allergen Reactions  . Iohexol Hives and Shortness Of Breath     Code: SOB, Desc: IMMEDIATELY SOB FOLLOWING IV INJECTION, SPOTTY HIVES, PT GIVEN BENADRYL AND EPI-ARS 08/22/09, Onset Date: [id number]     Prescriptions prior to admission  Medication Sig Dispense Refill Last Dose  . metoCLOPramide (REGLAN) 10 MG tablet Take 1 tablet (10 mg total) by mouth every 8 (eight) hours as needed for nausea. 15 tablet 0 Past Week at  Unknown time  . Prenatal Vit-Fe Fumarate-FA (MULTIVITAMIN-PRENATAL) 27-0.8 MG TABS tablet Take 1 tablet by mouth daily at 12 noon.   03/24/2016 at Unknown time  . ibuprofen (ADVIL,MOTRIN) 600 MG tablet Take 1 tablet (600 mg total) by mouth every 6 (six) hours as needed. (Patient not taking: Reported on 03/24/2016) 50 tablet 0     Review of Systems  Constitutional: Negative for fever and chills.  HENT:       Comes and goes over past 2 weeks- not at present time.  Eyes: Negative for blurred vision.  Gastrointestinal: Positive for nausea and abdominal pain. Negative for diarrhea and constipation.  Genitourinary: Negative for dysuria.  Neurological: Positive for headaches. Negative for dizziness.   Physical Exam   Blood pressure 136/85, pulse 106, temperature 98.2 F (36.8 C), temperature source Oral, resp. rate 17, height  (1.575 m), weight 138 lb 3.2 oz (62.687 kg), last menstrual period 01/18/2016, unknown if currently breastfeeding.  Physical Exam  Nursing note and vitals reviewed. Constitutional: She is oriented to person, place, and time. She appears well-developed and well-nourished. No distress.  HENT:  Head: Normocephalic.  Eyes: Pupils are equal, round, and reactive to  light.  Neck: Normal range of motion. Neck supple.  Cardiovascular: Normal rate and normal heart sounds.   Respiratory: Effort normal and breath sounds normal. No respiratory distress. She has no wheezes.  GI: Soft. She exhibits no distension. There is tenderness. There is no rebound and no guarding.  RLQ/suprapubic  Musculoskeletal: Normal range of motion.  Neurological: She is alert and oriented to person, place, and time.  Skin: Skin is warm and dry.  Psychiatric: She has a normal mood and affect.    MAU Course  Procedures Results for orders placed or performed during the hospital encounter of 03/24/16 (from the past 24 hour(s))  Urinalysis, Routine w reflex microscopic (not at [facility])     Status:  Abnormal   Collection Time: 03/24/16  5:41 PM  Result Value Ref Range   Color, Urine YELLOW YELLOW   APPearance CLEAR CLEAR   Specific Gravity, Urine 1.025 1.005 - 1.030   pH 6.0 5.0 - 8.0   Glucose, UA NEGATIVE NEGATIVE mg/dL   Hgb urine dipstick MODERATE (A) NEGATIVE   Bilirubin Urine NEGATIVE NEGATIVE   Ketones, ur 15 (A) NEGATIVE mg/dL   Protein, ur NEGATIVE NEGATIVE mg/dL   Nitrite NEGATIVE NEGATIVE   Leukocytes, UA TRACE (A) NEGATIVE  Urine microscopic-add on     Status: Abnormal   Collection Time: 03/24/16  5:41 PM  Result Value Ref Range   Squamous Epithelial / LPF 6-30 (A) NONE SEEN   WBC, UA 0-5 0 - 5 WBC/hpf   RBC / HPF 6-30 0 - 5 RBC/hpf   Bacteria, UA MANY (A) NONE SEEN   Crystals CA OXALATE CRYSTALS (A) NEGATIVE  US Ob Transvaginal  03/24/2016  CLINICAL DATA:  Acute onset of vaginal bleeding and generalized abdominal cramping. Initial encounter. EXAM: TRANSVAGINAL OB ULTRASOUND TECHNIQUE: Transvaginal ultrasound was performed for complete evaluation of the gestation as well as the maternal uterus, adnexal regions, and pelvic cul-de-sac. COMPARISON:  Pelvic ultrasound performed 03/17/2016 FINDINGS: Intrauterine gestational sac: Visualized; normal in shape. Yolk sac:  Yes Embryo:  Yes Cardiac Activity: Yes Heart Rate: 152 bpm CRL:   1.29 cm   7 w 4 d                  [place] EDC: 11/06/2016 Subchorionic hemorrhage: A small amount of subchorionic hemorrhage is noted. Maternal uterus/adnexae: The right ovary is unremarkable in appearance, measuring 3.1 x 2.9 x 2.8 cm. The left ovary is not visualized. No suspicious adnexal masses are seen. There is no evidence for ovarian torsion. No free fluid is seen within the pelvic cul-de-sac. IMPRESSION: 1. Single live intrauterine pregnancy noted, with a crown-rump length of 1.3 cm, corresponding to a gestational age of [redacted] weeks 4 days. This matches the gestational age of [redacted] weeks 6 days by the first ultrasound, reflecting an estimated date of  delivery of November 04, 2016. 2. Small amount of subchorionic hemorrhage noted. Electronically Signed   By: Roanna Raider M.D.   On: 03/24/2016 18:42   Pt upset and tearful when asked about OB care- pt states she and her husband are separated She has 4 children with baby being 74 mos old- pt is not working and no income Discussed with pt to proceed with Medicaid application Also [facility] may be able to assist pt with some material needs  Assessment and Plan  Bleeding in pregnancy- 1st trimester SLIUP [redacted]w[redacted]d [facility]- pelvic rest F/u with OB appointment at [facility] or [facility] Ca oxalate crystals in urine- increase water  intake; stay away from well water  Cathy Nash 03/24/2016, 6:07 PM

## 2016-03-24 NOTE — Discharge Instructions (Signed)
Reposo plvico  (Pelvic Rest) El reposo plvico se recomienda a las mujeres cuando:   La placenta cubre parcial o completamente la abertura del cuello del tero (placenta previa).  Hay sangrado entre la pared del tero y el saco amnitico en el primer trimestre (hemorragia subcorinica).  El cuello uterino comienza a abrirse sin iniciarse el trabajo de parto (cuello uterino incompetente, insuficiencia cervical).  El [place] de parto se inicia muy pronto (parto prematuro). INSTRUCCIONES PARA EL CUIDADO EN EL HOGAR   No tenga relaciones sexuales, estimulacin, ni orgasmos.  No use tampones, no se haga duchas vaginales ni coloque ningn objeto en la vagina.  No levante objetos que pesen ms de 10 libras (4,5 kg).  Evite las actividades extenuantes o tensionar los msculos de la pelvis. SOLICITE ATENCIN MDICA SI:   Tiene un sangrado vaginal durante el embarazo. Considrelo como una posible emergencia.  Siente clicos en la zona baja del estmago (ms fuertes que los clicos menstruales).  Nota flujo vaginal (acuoso, con moco o [place]).  Siente un dolor en la espalda leve y sordo.  Tiene contracciones regulares o endurecimiento del tero. SOLICITE ATENCIN MDICA DE INMEDIATO SI:  Observa sangrado vaginal y tiene placenta previa.    Esta informacin no tiene Theme park manager el consejo del mdico. Asegrese de hacerle al mdico cualquier pregunta que tenga.   Document Released: 08/22/2012 Elsevier Interactive Patient Education 2016 [employer].  Hemorragia vaginal durante Firefighter (primer trimestre) (Vaginal Bleeding During Pregnancy, First Trimester) Durante los primeros meses del embarazo es relativamente frecuente que se presente una pequea hemorragia (manchas). Esta situacin generalmente mejora por s misma. Estas hemorragias o manchas tienen diversas causas al inicio del embarazo. Algunas manchas pueden estar relacionadas al [employer] y otras no. En la [employer], la hemorragia es normal y no es un problema. Sin embargo, la hemorragia tambin puede ser un signo de algo grave. Debe informar a su mdico de inmediato si tiene alguna hemorragia vaginal. Algunas causas posibles de hemorragia vaginal durante el primer trimestre incluyen:  Infeccin o inflamacin del cuello del tero.  Crecimientos anormales (plipos) en el cuello del tero.  Aborto espontneo o amenaza de aborto espontneo.  Tejido del Psychiatrist se ha desarrollado fuera del tero y en una trompa de falopio (embarazo ectpico).  Se han desarrollado pequeos quistes en el tero en lugar de tejido de embarazo (embarazo molar). INSTRUCCIONES PARA EL CUIDADO EN EL HOGAR  Controle su afeccin para ver si hay cambios. Las siguientes indicaciones ayudarn a Psychologist, educational [employer] pueda sentir:  Siga las indicaciones del mdico para restringir su actividad. Si el mdico le indica descanso en la cama, debe quedarse en la cama y levantarse solo para ir al bao. No obstante, el mdico puede permitirle que continu con tareas livianas.  Si es necesario, organcese para que alguien le ayude con las actividades y responsabilidades cotidianas mientras est en cama.  Lleve un registro de la cantidad y la saturacin de las toallas higinicas que Landscape architect. Anote este dato.  No use tampones.No se haga duchas vaginales.  No tenga relaciones sexuales u orgasmos hasta que el mdico la autorice.  Si elimina tejido por la vagina, gurdelo para mostrrselo al [employer].  [place] solo medicamentos de venta libre o recetados, segn las indicaciones del mdico.  No tome aspirina, ya que puede causar hemorragias.  Cumpla con todas las visitas de control, segn le indique su mdico. SOLICITE ATENCIN MDICA SI:  Tiene un sangrado vaginal en  cualquier momento del embarazo.  Tiene calambres o dolores de [place].  Tiene fiebre que los medicamentos no Sports coachpueden controlar. SOLICITE ATENCIN MDICA  DE INMEDIATO SI:   Siente calambres intensos en la espalda o en el vientre (abdomen).  Elimina cogulos grandes o tejido por la vagina.  La hemorragia aumenta.  Si se siente mareada, dbil o se desmaya.  Tiene escalofros.  Tiene una prdida importante o sale lquido a borbotones por la vagina.  Se desmaya mientras defeca. ASEGRESE DE QUE:  Comprende estas instrucciones.  Controlar su afeccin.  Recibir ayuda de inmediato si no mejora o si empeora.   Esta informacin no tiene Theme park managercomo fin reemplazar el consejo del mdico. Asegrese de hacerle al mdico cualquier pregunta que tenga.   Document Released: 09/07/2005 Document Revised: 12/03/2013 Elsevier Interactive Patient Education 2016 Elsevier Inc.  Hematoma subcorinico (Subchorionic Hematoma) Un hematoma subcorinico es una acumulacin de sangre entre la pared externa de la placenta y la pared interna del la matriz (tero). La placenta es el rgano que conecta el feto a la pared del tero. La placenta realiza la funcin de alimentacin, respiracin (oxgeno al feto) y el trabajo de eliminacin de desechos (excrecin) del feto.  Un hematoma subcorinico es la anormalidad ms frecuente encontrada en una ecografa durante el primer trimestre o principios del segundo trimestre del embarazo. Si ha habido poca o ninguna hemorragia vaginal, generalmente los pequeos hematomas se reducen por su propia cuenta y no afectan al beb ni al Vanetta Muldersembarazo. La sangre es absorbida gradualmente durante una o [place] semanas. Cuando la hemorragia comienza ms tarde en el embarazo o el hematoma es ms grande o se produce en una paciente de edad avanzada, el resultado puede no ser tan bueno. Los grandes hematomas pueden agrandarse an ms y Lesothoaumenta las posibilidades de aborto espontneo. El hematoma subcorinico tambin aumenta el riesgo de desprendimiento precoz de la placenta del tero, muerte fetal y Sport and exercise psychologistparto prematuro. INSTRUCCIONES PARA EL CUIDADO EN EL HOGAR     Repose en cama si el mdico se lo recomienda. Aunque el reposo en cama no evitar la hemorragia o un aborto espontneo, su mdico puede recomendarlo.  Evite levantar objetos pesados (ms de 10 libras [4,5 kg]), hacer ejercicio, tener relaciones sexuales o realizar duchas vaginales segn se lo indique el profesional.  Lleve un registro de la cantidad y Hydrographic surveyorgrado de remojo (saturacin) de las toallas higinicas que Landscape architectutiliza cada da. Anote esta informacin.  No use tampones.  Cumpla con todas las visitas de control, segn le indique su mdico. El profesional podr pedirle que se realice anlisis de seguimiento, pruebas de Valley Grandeultrasonido o [place]uff[place]ambas. SOLICITE ATENCIN MDICA DE INMEDIATO SI:   Siente calambres intensos en el estmago, en la espalda, en el abdomen o en la pelvis.  Tiene fiebre.  Elimina cogulos o tejidos grandes. Guarde los tejidos para que su mdico los vea.  Si la hemorragia aumenta o siente mareos, debilidad o tiene episodios de [place]-Free[place]desmayos.   Esta informacin no tiene Theme park managercomo fin reemplazar el consejo del mdico. Asegrese de hacerle al mdico cualquier pregunta que tenga.   Document Released: 03/16/2009 Document Revised: 09/18/2013 Elsevier Interactive Patient Education 2016 [employer]vier [employer] Safe Medications in Pregnancy   Acne: Benzoyl Peroxide Salicylic Acid  Backache/Headache: Tylenol: 2 regular strength every 4 hours OR              2 Extra strength every 6 hours  Colds/Coughs/Allergies: Benadryl (alcohol free) 25 mg every 6 hours as needed Breath right strips Claritin Cepacol throat lozenges  Chloraseptic throat spray Cold-Eeze- up to three times per day Cough drops, alcohol free Flonase (by prescription only) Guaifenesin Mucinex Robitussin DM (plain only, alcohol free) Saline nasal spray/drops Sudafed (pseudoephedrine) & Actifed ** use only after [redacted] weeks gestation and if you do not have high blood pressure Tylenol Vicks Vaporub Zinc lozenges Zyrtec    Constipation: Colace Ducolax suppositories Fleet enema Glycerin suppositories Metamucil Milk of magnesia Miralax Senokot Smooth move tea  Diarrhea: Kaopectate Imodium A-D  *NO pepto Bismol  Hemorrhoids: Anusol Anusol HC Preparation H Tucks  Indigestion: Tums Maalox Mylanta Zantac  Pepcid  Insomnia: Benadryl (alcohol free)  every 6 hours as needed Tylenol PM Unisom, no Gelcaps  Leg Cramps: Tums MagGel  Nausea/Vomiting:  Bonine Dramamine Emetrol Ginger extract Sea bands Meclizine  Nausea medication to take during pregnancy:  Unisom (doxylamine succinate 25 mg tablets) Take one tablet daily at bedtime. If symptoms are not adequately controlled, the dose can be increased to a maximum recommended dose of two tablets daily (1/2 tablet in the morning, 1/2 tablet mid-afternoon and one at bedtime). Vitamin B6  tablets. Take one tablet twice a day (up to 200 mg per day).  Skin Rashes: Aveeno products Benadryl cream or  every 6 hours as needed Calamine Lotion 1% cortisone cream  Yeast infection: Gyne-lotrimin 7 Monistat 7   **If taking multiple medications, please check labels to avoid duplicating the same active ingredients **take medication as directed on the label ** Do not exceed 4000 mg of tylenol in 24 hours **Do not take medications that contain aspirin or ibuprofen

## 2016-04-13 ENCOUNTER — Inpatient Hospital Stay (HOSPITAL_COMMUNITY)
Admission: AD | Admit: 2016-04-13 | Discharge: 2016-04-13 | Disposition: A | Discharge status: Home / Self Care | Payer: Medicaid Other | Source: Ambulatory Visit | Attending: Obstetrics and Gynecology | Admitting: Obstetrics and Gynecology

## 2016-04-13 ENCOUNTER — Encounter (HOSPITAL_COMMUNITY): Payer: Self-pay

## 2016-04-13 DIAGNOSIS — O208 Other hemorrhage in early pregnancy: Secondary | ICD-10-CM | POA: Insufficient documentation

## 2016-04-13 DIAGNOSIS — R109 Unspecified abdominal pain: Secondary | ICD-10-CM

## 2016-04-13 DIAGNOSIS — O26899 Other specified pregnancy related conditions, unspecified trimester: Secondary | ICD-10-CM

## 2016-04-13 DIAGNOSIS — R102 Pelvic and perineal pain: Secondary | ICD-10-CM | POA: Insufficient documentation

## 2016-04-13 DIAGNOSIS — Z87891 Personal history of nicotine dependence: Secondary | ICD-10-CM | POA: Insufficient documentation

## 2016-04-13 DIAGNOSIS — Z3A10 10 weeks gestation of pregnancy: Secondary | ICD-10-CM | POA: Insufficient documentation

## 2016-04-13 DIAGNOSIS — O9989 Other specified diseases and conditions complicating pregnancy, childbirth and the puerperium: Secondary | ICD-10-CM

## 2016-04-13 DIAGNOSIS — R1030 Lower abdominal pain, unspecified: Secondary | ICD-10-CM | POA: Insufficient documentation

## 2016-04-13 NOTE — MAU Note (Signed)
Pt c/o abdominal pain starting last night. Pt took Tylenol PM and went to bed. Pt states pain had returned this morning when she woke up. Pt reports spotting when she went to the bathroom. Pt hasn't initiated prenatal care.

## 2016-04-13 NOTE — Discharge Instructions (Signed)
[facility]      Gratis        [phone]   Primer trimestre de Psychiatrist (First Trimester of Pregnancy) El primer trimestre de embarazo se extiende desde la semana1 hasta el final de la semana12 (mes1 al mes3). Una semana despus de que un espermatozoide fecunda un vulo, este se implantar en la pared uterina. Este embrin comenzar a Camera operator convertirse en un beb. Sus genes y los de su pareja forman el beb. Los genes del varn determinan si ser un nio o una nia. Entre la semana6 y [place], se forman los ojos y [place], y los latidos del corazn pueden verse en la ecografa. Al final de las 12semanas, todos los rganos del beb estn formados.  Ahora que est embarazada, querr hacer todo lo que est a su alcance para tener un beb sano. Dos de las cosas ms importantes son Winferd Humphrey buena atencin prenatal y seguir las indicaciones del mdico. La atencin prenatal incluye toda la asistencia mdica que usted recibe antes del nacimiento del beb. Esta ayudar a prevenir, Engineer, manufacturing y tratar cualquier problema durante el embarazo y [place]. CAMBIOS EN EL ORGANISMO Su organismo atraviesa por muchos cambios durante el [place], y estos varan de Neomia Dear mujer a Educational psychologist.   Al principio, puede aumentar o bajar algunos kilos.  Puede tener Programme researcher, broadcasting/film/video (nuseas) y vomitar. Si no puede controlar los vmitos, llame al mdico.  Puede cansarse con facilidad.  Es posible que tenga dolores de cabeza que pueden aliviarse con los medicamentos que el mdico le permita tomar.  Puede orinar con mayor frecuencia. El dolor al orinar puede significar que usted tiene una infeccin de la vejiga.  Debido al Vanetta Mulders, puede tener acidez estomacal.  Puede estar estreida, ya que ciertas hormonas enlentecen los movimientos de los msculos que [employer] desechos a travs de los intestinos.  Pueden aparecer hemorroides o abultarse e hincharse las venas (venas  varicosas).  Las [employer] pueden empezar a Government social research officer y Emergency planning/management officer. Los pezones pueden sobresalir ms, y el tejido que los rodea (areola) tornarse ms oscuro.  Las Veterinary surgeon y estar sensibles al cepillado y al hilo dental.  Pueden aparecer zonas oscuras o manchas (cloasma, mscara del Psychiatrist) en el rostro que probablemente se atenuarn despus del nacimiento del beb.  Los perodos menstruales se interrumpirn.  Tal vez no tenga apetito.  Puede sentir un fuerte deseo de consumir ciertos alimentos.  Puede tener cambios a Theatre manager a da, por ejemplo, por momentos puede estar emocionada por el Psychiatrist y por otros preocuparse porque algo pueda salir mal con el embarazo o el beb.  Tendr sueos ms vvidos y extraos.  Tal vez haya cambios en el cabello que pueden incluir su engrosamiento, crecimiento rpido y cambios en la textura. A algunas mujeres tambin se les cae el cabello durante o despus del [place], o tienen el cabello seco o fino. Lo ms probable es que el cabello se le normalice despus del nacimiento del beb. QU DEBE ESPERAR EN LAS CONSULTAS PRENATALES Durante una visita prenatal de rutina:  La pesarn para asegurarse de que usted y el beb estn creciendo normalmente.  Le controlarn la presin arterial.  Le medirn el abdomen para controlar el desarrollo del beb.  Se escucharn los latidos cardacos a partir de la semana10 o la12 de embarazo, aproximadamente.  Se analizarn los resultados de los estudios solicitados en visitas anteriores. El mdico puede preguntarle:  Cmo se siente.  Si siente  los movimientos del beb.  Si ha tenido sntomas anormales, como prdida de lquido, Nurembergsangrado, dolores de cabeza intensos o clicos abdominales.  Si est consumiendo algn producto que contenga tabaco, como cigarrillos, tabaco de Theatre managermascar y Administrator, Civil Servicecigarrillos electrnicos.  Si tiene Colgate-Palmolivealguna pregunta. Otros estudios que pueden realizarse durante el  primer trimestre incluyen lo siguiente:  Anlisis de sangre para determinar el tipo de sangre y Engineer, manufacturingdetectar la presencia de infecciones previas. Adems, se los usar para controlar si los niveles de hierro son bajos (anemia) y Chief Strategy Officerdeterminar los anticuerpos Rh. En una etapa ms avanzada del Lu Verneembarazo, se harn anlisis de sangre para saber si tiene diabetes, junto con otros estudios si surgen problemas.  Anlisis de orina para detectar infecciones, diabetes o protenas en la orina.  Una ecografa para confirmar que el beb crece y se desarrolla correctamente.  Una amniocentesis para diagnosticar posibles problemas genticos.  Estudios del feto para descartar espina bfida y sndrome de [place].  Es posible que necesite otras pruebas adicionales.  Prueba del VIH (virus de inmunodeficiencia humana). Los exmenes prenatales de rutina incluyen la prueba de deteccin del VIH, a menos que decida no Futures traderrealizrsela. INSTRUCCIONES PARA EL CUIDADO EN EL HOGAR  Medicamentos:  Siga las indicaciones del mdico en relacin con el uso de medicamentos. Durante el embarazo, hay medicamentos que pueden tomarse y otros que no.  Tome las vitaminas prenatales como se le indic.  Si est estreida, tome un laxante suave, si el mdico lo Libyan Arab Jamahiriyaautoriza. Dieta  Consuma alimentos balanceados. Elija alimentos variados, como carne o protenas de origen vegetal, pescado, leche y productos lcteos descremados, verduras, frutas y panes y Radiation protection practitionercereales integrales. El mdico la ayudar a Production assistant, radiodeterminar la cantidad de peso que puede Kerhonksonaumentar.  No coma carne cruda ni quesos sin cocinar. Estos elementos contienen bacterias que pueden causar defectos congnitos en el beb.  La ingesta diaria de cuatro o cinco comidas pequeas en lugar de tres comidas abundantes puede ayudar a Yahooaliviar las nuseas y los vmitos. Si empieza a tener nuseas, comer algunas [address] Avenuegalletas saladas puede ser de Petreyayuda. Beber lquidos National Cityentre las comidas en lugar de tomarlos durante  las comidas tambin puede ayudar a Optician, dispensingcalmar las nuseas y los vmitos.  Si est estreida, consuma alimentos con alto contenido de Abbevillefibra, como verduras y frutas frescas, y Radiation protection practitionercereales integrales. Beba suficiente lquido para Photographermantener la orina clara o de color amarillo plido. Actividad y Landscape architectejercicios  Haga ejercicio solamente como se lo haya indicado el mdico. El ejercicio la ayudar a:  Art gallery managerControlar el peso.  Mantenerse en forma.  Estar preparada para el trabajo de parto y Marionel parto.  Los dolores, los clicos en la parte baja del abdomen o los calambres en la cintura son un buen indicio de que debe dejar de Corporate treasurerhacer ejercicios. Consulte al mdico antes de seguir haciendo ejercicios normales.  Intente no estar de pie FedExdurante mucho tiempo. Mueva las piernas con frecuencia si debe estar de pie en un lugar durante mucho tiempo.  Evite levantar pesos Fortune Brandsexcesivos.  Use zapatos de tacones bajos y Brazilmantenga una buena postura.  Puede seguir teniendo The St. Paul Travelersrelaciones sexuales, excepto que el mdico le indique lo contrario. Alivio del dolor o las molestias  Use un sostn que le brinde buen soporte si siente dolor a la palpacin Mattelen las mamas.  Dese baos de asiento con agua tibia para Engineer, materialsaliviar el dolor o las molestias causadas por las hemorroides. Use crema antihemorroidal si el mdico se lo permite.  Descanse con las piernas elevadas si tiene calambres o  dolor de cintura.  Si tiene venas varicosas en las piernas, use medias de descanso. Eleve los pies durante , 3 o 4veces por da. Limite la cantidad de sal en su dieta. Cuidados prenatales  Programe las visitas prenatales para la semana12 de [place]. Generalmente se programan cada mes al principio y se hacen ms frecuentes en los 2 ltimos meses antes del parto.  Escriba sus preguntas. Llvelas cuando concurra a las visitas prenatales.  Concurra a todas las visitas prenatales como se lo haya indicado el mdico. Seguridad  Colquese el cinturn de  seguridad cuando conduzca.  Haga una lista de los nmeros de telfono de Associate Professor, que [employer] nmeros de telfono de familiares, [place], el hospital y los departamentos de polica y bomberos. Consejos generales  [place] al mdico que la derive a clases de educacin prenatal en su localidad. Debe comenzar a tomar las clases antes de Cytogeneticist en el mes6 de embarazo.  Pida ayuda si tiene necesidades nutricionales o de asesoramiento Academic librarian. El mdico puede aconsejarla o derivarla a especialistas para que la ayuden con diferentes necesidades.  No se d baos de inmersin en agua caliente, baos turcos ni saunas.  No se haga duchas vaginales ni use tampones o toallas higinicas perfumadas.  No mantenga las piernas cruzadas durante [place].  Evite el contacto con las bandejas sanitarias de los gatos y la tierra que estos animales usan. Estos elementos contienen bacterias que pueden causar defectos congnitos al beb y la posible prdida del feto debido a un aborto espontneo o muerte fetal.  No fume, no consuma hierbas ni medicamentos que no hayan sido recetados por el mdico. Las sustancias qumicas que estos productos contienen afectan la formacin y el desarrollo del beb.  No consuma ningn producto que contenga tabaco, lo que incluye cigarrillos, tabaco de Theatre manager y Administrator, Civil Service. Si necesita ayuda para dejar de fumar, consulte al American Express. Puede recibir asesoramiento y otro tipo de recursos para dejar de fumar.  Programe una cita con el dentista. En su casa, lvese los dientes con un cepillo dental blando y psese el hilo dental con suavidad. SOLICITE ATENCIN MDICA SI:   Tiene mareos.  Siente clicos leves, presin en la pelvis o dolor persistente en el abdomen.  Tiene nuseas, vmitos o diarrea persistentes.  Tiene secrecin vaginal con mal olor.  Siente dolor al [employer]ocoPhillips.  Tiene el rostro, las [place], las piernas o los tobillos ms hinchados. SOLICITE  ATENCIN MDICA DE INMEDIATO SI:   Tiene fiebre.  Tiene una prdida de lquido por la vagina.  Tiene sangrado o pequeas prdidas vaginales.  Siente dolor intenso o clicos en el abdomen.  Sube o baja de peso rpidamente.  Vomita sangre de color rojo brillante o material que parezca granos de caf.  Ha estado expuesta a la rubola y no ha sufrido la enfermedad.  Ha estado expuesta a la quinta enfermedad o a la varicela.  Tiene un dolor de cabeza intenso.  Le falta el aire.  Sufre cualquier tipo de traumatismo, por ejemplo, debido a una cada o un accidente automovilstico.   Esta informacin no tiene Theme park manager el consejo del mdico. Asegrese de hacerle al mdico cualquier pregunta que tenga.   Document Released: 09/07/2005 Document Revised: 12/19/2014 Elsevier Interactive Patient Education [employer]

## 2016-04-13 NOTE — MAU Provider Note (Signed)
Chief Complaint: Abdominal Pain and Vaginal Bleeding   First Provider Initiated Contact with Patient 04/13/16 [phone]        SUBJECTIVE  HPI  Cathy Nash is a 35 y.o. [id number] at [redacted]w[redacted]d by LMP who presents to maternity admissions reporting lower abdominal pain that started last night.  Had some spotting this morning. She denies vaginal itching/burning, urinary symptoms, h/a, dizziness, n/v, or fever/chills.   Does not want to be pregnant. Very tearful. Just had a baby 10/15/15   Not living with FOB now. Has only a cousin for support. Seen in ED4/6/17 and US showed a SIUP at 6+ weeks with moderate SCH.  RN note: Pt c/o abdominal pain starting last night. Pt took Tylenol PM and went to bed. Pt states pain had returned this morning when she woke up. Pt reports spotting when she went to the bathroom. Pt hasn't initiated prenatal care.         Past Medical History  Diagnosis Date  . Asthma   . Asthma   . Preterm labor     1st pregnancy d/t pre-e  . Pregnancy induced hypertension     previous pregnancy  . Pyelonephritis     June 2013  . Varicose veins   . Vaginal pessary in situ   . Anxiety    Past Surgical History  Procedure Laterality Date  . No past surgeries     Social History   Social History  . Marital Status: Single    Spouse Name: N/A  . Number of Children: N/A  . Years of Education: N/A   Occupational History  . Not on file.   Social History Main Topics  . Smoking status: Former Games developer  . Smokeless tobacco: Never Used     Comment: yrs ago  . Alcohol Use: No  . Drug Use: No  . Sexual Activity:    Partners: Male    Birth Control/ Protection: None   Other Topics Concern  . Not on file   Social History Narrative   ** Merged History Encounter **       ** Merged History Encounter **       No current facility-administered medications on file prior to encounter.   Current Outpatient Prescriptions on File Prior to Encounter  Medication Sig Dispense  Refill  . Prenatal Vit-Fe Fumarate-FA (MULTIVITAMIN-PRENATAL) 27-0.8 MG TABS tablet Take 1 tablet by mouth daily at 12 noon.    . metoCLOPramide (REGLAN) 10 MG tablet Take 1 tablet (10 mg total) by mouth every 8 (eight) hours as needed for nausea. (Patient not taking: Reported on 04/13/2016) 15 tablet 0  . promethazine (PHENERGAN) 25 MG tablet Take 1 tablet (25 mg total) by mouth every 6 (six) hours as needed for nausea or vomiting. (Patient not taking: Reported on 04/13/2016) 30 tablet 0   Allergies  Allergen Reactions  . Iohexol Hives and Shortness Of Breath     Code: SOB, Desc: IMMEDIATELY SOB FOLLOWING IV INJECTION, SPOTTY HIVES, PT GIVEN BENADRYL AND EPI-ARS 08/22/09, Onset Date: [id number]     I have reviewed patient's Past Medical Hx, Surgical Hx, Family Hx, Social Hx, medications and allergies.   ROS:  Review of Systems Review of Systems  Constitutional: Negative for fever and chills.  Gastrointestinal: Negative for nausea, vomiting, diarrhea and constipation.  Positive for abdominal pelvic pain Genitourinary: Negative for dysuria. negative Positive for bleeding Musculoskeletal: Negative for back pain.  Neurological: Negative for dizziness and weakness.    Physical Exam  Patient Vitals for  the past 24 hrs:  BP Temp Temp src Pulse Resp Height Weight  04/13/16 0922 118/74 mmHg 97.9 F (36.6 C) Oral 73 18 5\' 2"  (1.575 m) 138 lb (62.596 kg)   Physical Exam  Constitutional: Well-developed, well-nourished female in no acute distress.  Cardiovascular: normal rate Respiratory: normal effort GI: Abd soft, non-tender. Pos BS x 4 MS: Extremities nontender, no edema, normal ROM Neurologic: Alert and oriented x 4.  GU: Neg CVAT.  PELVIC EXAM: Cervix pink, visually closed, without lesion, scant white creamy discharge, vaginal walls and external genitalia normal Bimanual exam: Cervix 0/long/high, firm, anterior, neg CMT, uterus nontender, nonenlarged, adnexa without tenderness, enlargement,  or mass  FHTs 150 by doppler  LAB RESULTS No results found for this or any previous visit (from the past 24 hour(s)).  --/--/O POS (11/02 1357)  IMAGING US Ob Comp Less 14 Wks  03/17/2016  CLINICAL DATA:  Pregnant with pelvic pain. EXAM: OBSTETRIC <14 WK [place] AND TRANSVAGINAL OB US TECHNIQUE: Both transabdominal and transvaginal ultrasound examinations were performed for complete evaluation of the gestation as well as the maternal uterus, adnexal regions, and pelvic cul-de-sac. Transvaginal technique was performed to assess early pregnancy. COMPARISON:  10/05/2015 FINDINGS: Intrauterine gestational sac: Visualized/normal in shape. Yolk sac:  Present Embryo:  Present Cardiac Activity: Present Heart Rate: 116  bpm CRL:  8.8  mm   6 w   6 d                  [place] EDC: 11/04/2016 Subchorionic hemorrhage:  Moderate Maternal uterus/adnexae: Normal ovaries.  Corpus luteum cyst noted on the right. IMPRESSION: 1. Living intrauterine embryo estimated at 6 weeks and 6 days gestation. 2. Moderate subchorionic hemorrhage. 3. Normal ovaries. Electronically Signed   By: Rudie Meyer M.D.   On: 03/17/2016 15:24   US Ob Transvaginal  03/24/2016  CLINICAL DATA:  Acute onset of vaginal bleeding and generalized abdominal cramping. Initial encounter. EXAM: TRANSVAGINAL OB ULTRASOUND TECHNIQUE: Transvaginal ultrasound was performed for complete evaluation of the gestation as well as the maternal uterus, adnexal regions, and pelvic cul-de-sac. COMPARISON:  Pelvic ultrasound performed 03/17/2016 FINDINGS: Intrauterine gestational sac: Visualized; normal in shape. Yolk sac:  Yes Embryo:  Yes Cardiac Activity: Yes Heart Rate: 152 bpm CRL:   1.29 cm   7 w 4 d                  [place] EDC: 11/06/2016 Subchorionic hemorrhage: A small amount of subchorionic hemorrhage is noted. Maternal uterus/adnexae: The right ovary is unremarkable in appearance, measuring 3.1 x 2.9 x 2.8 cm. The left ovary is not visualized. No suspicious adnexal masses  are seen. There is no evidence for ovarian torsion. No free fluid is seen within the pelvic cul-de-sac. IMPRESSION: 1. Single live intrauterine pregnancy noted, with a crown-rump length of 1.3 cm, corresponding to a gestational age of [redacted] weeks 4 days. This matches the gestational age of [redacted] weeks 6 days by the first ultrasound, reflecting an estimated date of delivery of November 04, 2016. 2. Small amount of subchorionic hemorrhage noted. Electronically Signed   By: Roanna Raider M.D.   On: 03/24/2016 18:42   US Ob Transvaginal  03/17/2016  CLINICAL DATA:  Pregnant with pelvic pain. EXAM: OBSTETRIC <14 WK [place] AND TRANSVAGINAL OB US TECHNIQUE: Both transabdominal and transvaginal ultrasound examinations were performed for complete evaluation of the gestation as well as the maternal uterus, adnexal regions, and pelvic cul-de-sac. Transvaginal technique was performed to assess early pregnancy.  COMPARISON:  10/05/2015 FINDINGS: Intrauterine gestational sac: Visualized/normal in shape. Yolk sac:  Present Embryo:  Present Cardiac Activity: Present Heart Rate: 116  bpm CRL:  8.8  mm   6 w   6 d                  US EDC: 11/04/2016 Subchorionic hemorrhage:  Moderate Maternal uterus/adnexae: Normal ovaries.  Corpus luteum cyst noted on the right. IMPRESSION: 1. Living intrauterine embryo estimated at 6 weeks and 6 days gestation. 2. Moderate subchorionic hemorrhage. 3. Normal ovaries. Electronically Signed   By: Rudie MeyerP.  Gallerani M.D.   On: 03/17/2016 15:24    MAU Management/MDM: Discussed baby is growing well, with good heartbeat Patient sobbing. Did not want to be pregnant Wanted to get an abortion, but does not think it is the right thing to do Offered counseling via [facility] or she can go to the abortion clinic [facility]   ASSESSMENT SIUP at 504w5d  Abdominal cramping Spotting with known subchorionic hemorrhage  PLAN Discharge home Bleeding precautions [facility] given WIll  refer to  clinic     Medication List    ASK your doctor about these medications        diphenhydramine-acetaminophen 25-500 MG Tabs tablet  Commonly known as:  TYLENOL PM  Take 1 tablet by mouth at bedtime as needed (pain).     metoCLOPramide 10 MG tablet  Commonly known as:  REGLAN  Take 1 tablet (10 mg total) by mouth every 8 (eight) hours as needed for nausea.     multivitamin-prenatal 27-0.8 MG Tabs tablet  Take 1 tablet by mouth daily at 12 noon.     promethazine 25 MG tablet  Commonly known as:  PHENERGAN  Take 1 tablet (25 mg total) by mouth every 6 (six) hours as needed for nausea or vomiting.        Pt stable at time of discharge. Encouraged to return here or to other Urgent Care/ED if she develops worsening of symptoms, increase in pain, fever, or other concerning symptoms.    Wynelle BourgeoisMarie Shilee Biggs CNM, MSN Certified Nurse-Midwife 04/13/2016  9:28 AM

## 2016-05-10 ENCOUNTER — Encounter: Payer: Self-pay | Admitting: Family Medicine

## 2016-05-10 ENCOUNTER — Encounter: Payer: Medicaid Other | Admitting: Family Medicine

## 2016-05-26 ENCOUNTER — Inpatient Hospital Stay (HOSPITAL_COMMUNITY): Payer: Self-pay

## 2016-05-26 ENCOUNTER — Inpatient Hospital Stay (HOSPITAL_COMMUNITY)
Admission: AD | Admit: 2016-05-26 | Discharge: 2016-05-26 | Disposition: A | Discharge status: Home / Self Care | Payer: Self-pay | Source: Ambulatory Visit | Attending: Obstetrics & Gynecology | Admitting: Obstetrics & Gynecology

## 2016-05-26 ENCOUNTER — Encounter (HOSPITAL_COMMUNITY): Payer: Self-pay

## 2016-05-26 DIAGNOSIS — O26892 Other specified pregnancy related conditions, second trimester: Secondary | ICD-10-CM | POA: Insufficient documentation

## 2016-05-26 DIAGNOSIS — Z87891 Personal history of nicotine dependence: Secondary | ICD-10-CM | POA: Insufficient documentation

## 2016-05-26 DIAGNOSIS — R102 Pelvic and perineal pain: Secondary | ICD-10-CM | POA: Insufficient documentation

## 2016-05-26 DIAGNOSIS — N949 Unspecified condition associated with female genital organs and menstrual cycle: Secondary | ICD-10-CM

## 2016-05-26 DIAGNOSIS — Z3A16 16 weeks gestation of pregnancy: Secondary | ICD-10-CM | POA: Insufficient documentation

## 2016-05-26 LAB — CBC
HCT: 30.5 % — ABNORMAL LOW (ref 36.0–46.0)
HEMOGLOBIN: 10.7 g/dL — AB (ref 12.0–15.0)
MCH: 30.6 pg (ref 26.0–34.0)
MCHC: 35.1 g/dL (ref 30.0–36.0)
MCV: 87.1 fL (ref 78.0–100.0)
Platelets: 261 10*3/uL (ref 150–400)
RBC: 3.5 MIL/uL — AB (ref 3.87–5.11)
RDW: 13.9 % (ref 11.5–15.5)
WBC: 6.7 10*3/uL (ref 4.0–10.5)

## 2016-05-26 LAB — COMPREHENSIVE METABOLIC PANEL
ALK PHOS: 56 U/L (ref 38–126)
ALT: 11 U/L — AB (ref 14–54)
AST: 16 U/L (ref 15–41)
Albumin: 3.2 g/dL — ABNORMAL LOW (ref 3.5–5.0)
Anion gap: 6 (ref 5–15)
BUN: 7 mg/dL (ref 6–20)
CALCIUM: 8.5 mg/dL — AB (ref 8.9–10.3)
CO2: 22 mmol/L (ref 22–32)
CREATININE: 0.39 mg/dL — AB (ref 0.44–1.00)
Chloride: 106 mmol/L (ref 101–111)
Glucose, Bld: 102 mg/dL — ABNORMAL HIGH (ref 65–99)
Potassium: 3.2 mmol/L — ABNORMAL LOW (ref 3.5–5.1)
Sodium: 134 mmol/L — ABNORMAL LOW (ref 135–145)
Total Bilirubin: 0.4 mg/dL (ref 0.3–1.2)
Total Protein: 6.4 g/dL — ABNORMAL LOW (ref 6.5–8.1)

## 2016-05-26 LAB — URINE MICROSCOPIC-ADD ON

## 2016-05-26 LAB — URINALYSIS, ROUTINE W REFLEX MICROSCOPIC
Bilirubin Urine: NEGATIVE
Glucose, UA: 250 mg/dL — AB
Ketones, ur: 15 mg/dL — AB
NITRITE: NEGATIVE
PH: 6 (ref 5.0–8.0)
Protein, ur: NEGATIVE mg/dL
SPECIFIC GRAVITY, URINE: 1.02 (ref 1.005–1.030)

## 2016-05-26 LAB — WET PREP, GENITAL
SPERM: NONE SEEN
Trich, Wet Prep: NONE SEEN
Yeast Wet Prep HPF POC: NONE SEEN

## 2016-05-26 MED ORDER — HYDROMORPHONE HCL 1 MG/ML IJ SOLN
1.0000 mg | Freq: Once | INTRAMUSCULAR | Status: AC
  Administered 2016-05-26: 1 mg via INTRAVENOUS
  Filled 2016-05-26: qty 1

## 2016-05-26 MED ORDER — CYCLOBENZAPRINE HCL 10 MG PO TABS: 10.0000 mg | ORAL_TABLET | Freq: Two times a day (BID) | ORAL | Status: DC | PRN

## 2016-05-26 MED ORDER — LACTATED RINGERS IV BOLUS (SEPSIS)
1000.0000 mL | Freq: Once | INTRAVENOUS | Status: AC
  Administered 2016-05-26: 1000 mL via INTRAVENOUS

## 2016-05-26 NOTE — MAU Provider Note (Signed)
History     CSN: [id number]  Arrival date and time: 05/26/16 [id number]   First Provider Initiated Contact with Patient 05/26/16 1921      Chief Complaint  Patient presents with  . Abdominal Pain   HPI  [name] is a 35 y.o. female [phone] @ 6361w6d here with LLQ pain that started 2 hours prior to her arrival here. The symptoms came on abruptly. The pain is constant, the pain is shooting. The pain does not radiate anywhere.   She is also complaining of vaginal bleeding; pink in color, small amount. She feels it is coming from her vagina and not her urine. Patient currently rates her pain 8/10.    OB History    Gravida Para Term Preterm AB TAB SAB Ectopic Multiple Living   5 4 2 2  0 0 0 0 0 4      Past Medical History  Diagnosis Date  . Asthma   . Asthma   . Preterm labor     1st pregnancy d/t pre-e  . Pregnancy induced hypertension     previous pregnancy  . Pyelonephritis     June 2013  . Varicose veins   . Vaginal pessary in situ   . Anxiety     Past Surgical History  Procedure Laterality Date  . No past surgeries      Family History  Problem Relation Age of Onset  . Anesthesia problems Neg Hx   . Hypotension Neg Hx   . Malignant hyperthermia Neg Hx   . Pseudochol deficiency Neg Hx   . Other Neg Hx   . Diabetes Mother   . Asthma Father     Social History  Substance Use Topics  . Smoking status: Former Games developermoker  . Smokeless tobacco: Never Used     Comment: yrs ago  . Alcohol Use: No    Allergies:  Allergies  Allergen Reactions  . Iohexol Hives and Shortness Of Breath     Code: SOB, Desc: IMMEDIATELY SOB FOLLOWING IV INJECTION, SPOTTY HIVES, PT GIVEN BENADRYL AND EPI-ARS 08/22/09, Onset Date: [id number]     Prescriptions prior to admission  Medication Sig Dispense Refill Last Dose  . diphenhydramine-acetaminophen (TYLENOL PM) 25-500 MG TABS tablet Take 1 tablet by mouth at bedtime as needed (pain).   04/12/2016 at Unknown time  .  metoCLOPramide (REGLAN) 10 MG tablet Take 1 tablet (10 mg total) by mouth every 8 (eight) hours as needed for nausea. (Patient not taking: Reported on 04/13/2016) 15 tablet 0 Past Week at Unknown time  . Prenatal Vit-Fe Fumarate-FA (MULTIVITAMIN-PRENATAL) 27-0.8 MG TABS tablet Take 1 tablet by mouth daily at 12 noon.   04/12/2016 at Unknown time  . promethazine (PHENERGAN) 25 MG tablet Take 1 tablet (25 mg total) by mouth every 6 (six) hours as needed for nausea or vomiting. (Patient not taking: Reported on 04/13/2016) 30 tablet 0    Results for orders placed or performed during the hospital encounter of 05/26/16 (from the past 48 hour(s))  Urinalysis, Routine w reflex microscopic ([facility])     Status: Abnormal   Collection Time: 05/26/16  7:00 PM  Result Value Ref Range   Color, Urine YELLOW YELLOW   APPearance CLEAR CLEAR   Specific Gravity, Urine 1.020 1.005 - 1.030   pH 6.0 5.0 - 8.0   Glucose, UA 250 (A) NEGATIVE mg/dL   Hgb urine dipstick MODERATE (A) NEGATIVE   Bilirubin Urine NEGATIVE NEGATIVE   Ketones, ur 15 (A) NEGATIVE  mg/dL   Protein, ur NEGATIVE NEGATIVE mg/dL   Nitrite NEGATIVE NEGATIVE   Leukocytes, UA SMALL (A) NEGATIVE  Urine microscopic-add on     Status: Abnormal   Collection Time: 05/26/16  7:00 PM  Result Value Ref Range   Squamous Epithelial / LPF 0-5 (A) NONE SEEN   WBC, UA 0-5 0 - 5 WBC/hpf   RBC / HPF 0-5 0 - 5 RBC/hpf   Bacteria, UA FEW (A) NONE SEEN   Urine-Other MUCOUS PRESENT   Wet prep, genital     Status: Abnormal   Collection Time: 05/26/16  7:33 PM  Result Value Ref Range   Yeast Wet Prep HPF POC NONE SEEN NONE SEEN   Trich, Wet Prep NONE SEEN NONE SEEN   Clue Cells Wet Prep HPF POC PRESENT (A) NONE SEEN   WBC, Wet Prep HPF POC MODERATE (A) NONE SEEN    Comment: MANY BACTERIA SEEN   Sperm NONE SEEN   CBC     Status: Abnormal   Collection Time: 05/26/16  7:38 PM  Result Value Ref Range   WBC 6.7 4.0 - 10.5 K/uL   RBC 3.50 (L) 3.87 - 5.11 MIL/uL    Hemoglobin 10.7 (L) 12.0 - 15.0 g/dL   HCT 16.1 (L) 09.6 - 04.5 %   MCV 87.1 78.0 - 100.0 fL   MCH 30.6 26.0 - 34.0 pg   MCHC 35.1 30.0 - 36.0 g/dL   RDW 40.9 81.1 - 91.4 %   Platelets 261 150 - 400 K/uL    Review of Systems  Constitutional: Positive for chills. Negative for fever.  Gastrointestinal: Positive for vomiting. Negative for nausea, diarrhea and constipation.  Genitourinary: Positive for dysuria (A little ). Negative for urgency, frequency, hematuria and flank pain.       + odorous discharge.   Musculoskeletal: Negative for back pain.   Physical Exam   Blood pressure 97/52, pulse 93, temperature 97.3 F (36.3 C), temperature source Oral, resp. rate 18, height 5\' 2"  (1.575 m), weight 133 lb (60.328 kg), last menstrual period 01/18/2016, unknown if currently breastfeeding.  Physical Exam  Constitutional: She is oriented to person, place, and time. She appears well-developed and well-nourished. She appears distressed.  HENT:  Head: Normocephalic.  Eyes: Pupils are equal, round, and reactive to light.  Respiratory: Effort normal.  GI: Soft. Normal appearance. There is tenderness in the left lower quadrant. There is rigidity, guarding and CVA tenderness (Left sided only ). There is no rebound.  Genitourinary:  Speculum exam: Vagina - Small amount of creamy discharge, no odor Cervix - No contact bleeding, no active bleeding noted.  Bimanual exam: Cervix closed, soft. Uterus non tender, enlarged  Significant Left Adnexal tenderness, no masses bilaterally GC/Chlam, wet prep done Chaperone present for exam.  Musculoskeletal: Normal range of motion.  Neurological: She is alert and oriented to person, place, and time.  Skin: Skin is warm. She is not diaphoretic.   Results for orders placed or performed during the hospital encounter of 05/26/16 (from the past 24 hour(s))  Urinalysis, Routine w reflex microscopic (not at [facility])     Status: Abnormal   Collection Time:  05/26/16  7:00 PM  Result Value Ref Range   Color, Urine YELLOW YELLOW   APPearance CLEAR CLEAR   Specific Gravity, Urine 1.020 1.005 - 1.030   pH 6.0 5.0 - 8.0   Glucose, UA 250 (A) NEGATIVE mg/dL   Hgb urine dipstick MODERATE (A) NEGATIVE   Bilirubin Urine NEGATIVE NEGATIVE  Ketones, ur 15 (A) NEGATIVE mg/dL   Protein, ur NEGATIVE NEGATIVE mg/dL   Nitrite NEGATIVE NEGATIVE   Leukocytes, UA SMALL (A) NEGATIVE  Urine microscopic-add on     Status: Abnormal   Collection Time: 05/26/16  7:00 PM  Result Value Ref Range   Squamous Epithelial / LPF 0-5 (A) NONE SEEN   WBC, UA 0-5 0 - 5 WBC/hpf   RBC / HPF 0-5 0 - 5 RBC/hpf   Bacteria, UA FEW (A) NONE SEEN   Urine-Other MUCOUS PRESENT   Wet prep, genital     Status: Abnormal   Collection Time: 05/26/16  7:33 PM  Result Value Ref Range   Yeast Wet Prep HPF POC NONE SEEN NONE SEEN   Trich, Wet Prep NONE SEEN NONE SEEN   Clue Cells Wet Prep HPF POC PRESENT (A) NONE SEEN   WBC, Wet Prep HPF POC MODERATE (A) NONE SEEN   Sperm NONE SEEN   CBC     Status: Abnormal   Collection Time: 05/26/16  7:38 PM  Result Value Ref Range   WBC 6.7 4.0 - 10.5 K/uL   RBC 3.50 (L) 3.87 - 5.11 MIL/uL   Hemoglobin 10.7 (L) 12.0 - 15.0 g/dL   HCT 16.1 (L) 09.6 - 04.5 %   MCV 87.1 78.0 - 100.0 fL   MCH 30.6 26.0 - 34.0 pg   MCHC 35.1 30.0 - 36.0 g/dL   RDW 40.9 81.1 - 91.4 %   Platelets 261 150 - 400 K/uL  Comprehensive metabolic panel     Status: Abnormal   Collection Time: 05/26/16  7:38 PM  Result Value Ref Range   Sodium 134 (L) 135 - 145 mmol/L   Potassium 3.2 (L) 3.5 - 5.1 mmol/L   Chloride 106 101 - 111 mmol/L   CO2 22 22 - 32 mmol/L   Glucose, Bld 102 (H) 65 - 99 mg/dL   BUN 7 6 - 20 mg/dL   Creatinine, Ser 7.82 (L) 0.44 - 1.00 mg/dL   Calcium 8.5 (L) 8.9 - 10.3 mg/dL   Total Protein 6.4 (L) 6.5 - 8.1 g/dL   Albumin 3.2 (L) 3.5 - 5.0 g/dL   AST 16 15 - 41 U/L   ALT 11 (L) 14 - 54 U/L   Alkaline Phosphatase 56 38 - 126 U/L   Total  Bilirubin 0.4 0.3 - 1.2 mg/dL   GFR calc non Af Amer >60 >60 mL/min   GFR calc Af Amer >60 >60 mL/min   Anion gap 6 5 - 15   US Pelvis Limited  05/26/2016  CLINICAL DATA:  Left lower quadrant pain for 2 hours. Hematuria. Patient is [redacted] weeks pregnant. EXAM: LIMITED ULTRASOUND OF PELVIS TECHNIQUE: Limited transabdominal ultrasound examination of the pelvis was performed. COMPARISON:  03/24/2016 FINDINGS: Images of the pregnancy, uterus, and endometrium are not obtained. These structures are not well visualized and are not commented upon for this report. The left ovary measures 1.5 x 1.4 x 2 cm. Flow is demonstrated within the left ovary on color flow Doppler imaging. No abnormal adnexal masses. The right ovary measures 1.8 x 1.1 x 1.2 cm. Flow is demonstrated within the right ovary on color flow Doppler images. No abnormal adnexal masses. No free fluid demonstrated in the pelvis. IMPRESSION: Limited transabdominal images of the ovaries demonstrate normal sized ovaries bilaterally. No abnormal adnexal masses are seen. Electronically Signed   By: Burman Nieves M.D.   On: 05/26/2016 21:12   US Renal  05/26/2016  CLINICAL DATA:  Acute onset of severe left lower quadrant abdominal pain and hematuria. Initial encounter. EXAM: RENAL / URINARY TRACT ULTRASOUND COMPLETE COMPARISON:  CT of the abdomen and pelvis performed 06/26/2014 FINDINGS: Right Kidney: Length: 10.7 cm. Echogenicity within normal limits. No mass or hydronephrosis visualized. Left Kidney: Length: 12.1 cm. Echogenicity within normal limits. No mass or hydronephrosis visualized. Bladder: Appears normal for degree of bladder distention. Bilateral ureteral jets are visualized. IMPRESSION: Unremarkable renal ultrasound.  No evidence of hydronephrosis. Electronically Signed   By: Roanna Raider M.D.   On: 05/26/2016 21:12     MAU Course  Procedures  None  MDM  + fetal heart tones via doppler; significant pain with doppler  CBC CMP  Lr  bolus Dilaudid 1 mg IV + hematuria, no blood noted on vaginal exam.  Pelvic US and renal US  Urine culture pending   Report given to Thressa Sheller CNM who resumes care of the patient.   Duane Lope, NP   Assessment and Plan   1. Round ligament pain   2. [redacted] weeks gestation of pregnancy    DC home Comfort measures reviewed  2nd Trimester precautions  Bleeding precautions PTL precautions  Fetal kick counts RX: flexeril PRN #20  Return to MAU as needed FU with OB as planned  Follow-up Information    Follow up with [facility].   Specialty:  Obstetrics and Gynecology   Why:  As scheduled   Contact information:   [address] [place] [address] [phone]

## 2016-05-26 NOTE — MAU Note (Signed)
Pt C/O LLQ pain for the past 2 hours, hurts the worst with walking.  Noted blood with wiping this afternoon, unsure if it is vaginal or in her urine.  States she has noted watery discharge with an odor the past 2 days.

## 2016-05-26 NOTE — Discharge Instructions (Signed)

## 2016-05-27 LAB — GC/CHLAMYDIA PROBE AMP (~~LOC~~) NOT AT ARMC
CHLAMYDIA, DNA PROBE: NEGATIVE
Neisseria Gonorrhea: NEGATIVE

## 2016-05-28 LAB — CULTURE, OB URINE: SPECIAL REQUESTS: NORMAL

## 2016-05-31 ENCOUNTER — Emergency Department (HOSPITAL_COMMUNITY)
Admission: EM | Admit: 2016-05-31 | Discharge: 2016-05-31 | Disposition: A | Discharge status: Home / Self Care | Payer: Medicaid Other | Attending: Emergency Medicine | Admitting: Emergency Medicine

## 2016-05-31 ENCOUNTER — Encounter (HOSPITAL_COMMUNITY): Payer: Self-pay | Admitting: *Deleted

## 2016-05-31 DIAGNOSIS — Z87891 Personal history of nicotine dependence: Secondary | ICD-10-CM | POA: Insufficient documentation

## 2016-05-31 DIAGNOSIS — N390 Urinary tract infection, site not specified: Secondary | ICD-10-CM

## 2016-05-31 DIAGNOSIS — Z91040 Latex allergy status: Secondary | ICD-10-CM | POA: Insufficient documentation

## 2016-05-31 DIAGNOSIS — Z3A16 16 weeks gestation of pregnancy: Secondary | ICD-10-CM | POA: Insufficient documentation

## 2016-05-31 DIAGNOSIS — J45909 Unspecified asthma, uncomplicated: Secondary | ICD-10-CM | POA: Insufficient documentation

## 2016-05-31 DIAGNOSIS — O2342 Unspecified infection of urinary tract in pregnancy, second trimester: Secondary | ICD-10-CM | POA: Insufficient documentation

## 2016-05-31 LAB — LIPASE, BLOOD: Lipase: 26 U/L (ref 11–51)

## 2016-05-31 LAB — URINE MICROSCOPIC-ADD ON

## 2016-05-31 LAB — URINALYSIS, ROUTINE W REFLEX MICROSCOPIC
BILIRUBIN URINE: NEGATIVE
Glucose, UA: NEGATIVE mg/dL
Ketones, ur: 40 mg/dL — AB
NITRITE: POSITIVE — AB
PROTEIN: NEGATIVE mg/dL
SPECIFIC GRAVITY, URINE: 1.033 — AB (ref 1.005–1.030)
pH: 6.5 (ref 5.0–8.0)

## 2016-05-31 LAB — COMPREHENSIVE METABOLIC PANEL
ALK PHOS: 57 U/L (ref 38–126)
ALT: 11 U/L — ABNORMAL LOW (ref 14–54)
ANION GAP: 8 (ref 5–15)
AST: 16 U/L (ref 15–41)
Albumin: 3.3 g/dL — ABNORMAL LOW (ref 3.5–5.0)
BILIRUBIN TOTAL: 0.4 mg/dL (ref 0.3–1.2)
BUN: 7 mg/dL (ref 6–20)
CALCIUM: 8.8 mg/dL — AB (ref 8.9–10.3)
CO2: 20 mmol/L — ABNORMAL LOW (ref 22–32)
Chloride: 108 mmol/L (ref 101–111)
Creatinine, Ser: 0.58 mg/dL (ref 0.44–1.00)
GFR calc non Af Amer: >60 mL/min (ref >60)
Glucose, Bld: 121 mg/dL — ABNORMAL HIGH (ref 65–99)
Potassium: 3.3 mmol/L — ABNORMAL LOW (ref 3.5–5.1)
Sodium: 136 mmol/L (ref 135–145)
TOTAL PROTEIN: 6.3 g/dL — AB (ref 6.5–8.1)

## 2016-05-31 LAB — CBC
HCT: 34.6 % — ABNORMAL LOW (ref 36.0–46.0)
HEMOGLOBIN: 11.6 g/dL — AB (ref 12.0–15.0)
MCH: 30.4 pg (ref 26.0–34.0)
MCHC: 33.5 g/dL (ref 30.0–36.0)
MCV: 90.6 fL (ref 78.0–100.0)
PLATELETS: 287 10*3/uL (ref 150–400)
RBC: 3.82 MIL/uL — AB (ref 3.87–5.11)
RDW: 13.6 % (ref 11.5–15.5)
WBC: 7.7 10*3/uL (ref 4.0–10.5)

## 2016-05-31 LAB — I-STAT BETA HCG BLOOD, ED (MC, WL, AP ONLY)

## 2016-05-31 MED ORDER — LIDOCAINE HCL (PF) 1 % IJ SOLN
INTRAMUSCULAR | Status: AC
  Filled 2016-05-31: qty 5

## 2016-05-31 MED ORDER — CEPHALEXIN 500 MG PO CAPS: 1000.0000 mg | ORAL_CAPSULE | Freq: Two times a day (BID) | ORAL | Status: DC

## 2016-05-31 MED ORDER — CEFTRIAXONE SODIUM 1 G IJ SOLR
1.0000 g | Freq: Once | INTRAMUSCULAR | Status: AC
  Administered 2016-05-31: 1 g via INTRAMUSCULAR
  Filled 2016-05-31: qty 10

## 2016-05-31 MED ORDER — OXYCODONE-ACETAMINOPHEN 5-325 MG PO TABS
1.0000 | ORAL_TABLET | Freq: Once | ORAL | Status: AC
  Administered 2016-05-31: 1 via ORAL
  Filled 2016-05-31: qty 1

## 2016-05-31 NOTE — ED Provider Notes (Signed)
CSN: [id number]     Arrival date & time 05/31/16  0125 History  By signing my name below, I, Emmanuella Mensah, attest that this documentation has been prepared under the direction and in the presence of Gilda Crease, MD. Electronically Signed: Angelene Giovanni, ED Scribe. 05/31/2016. 2:16 AM.     Chief Complaint  Patient presents with  . Abdominal Pain   Patient is a 35 y.o. female presenting with abdominal pain. The history is provided by the patient. No language interpreter was used.  Abdominal Pain Pain location:  LLQ Pain radiates to:  Does not radiate Pain severity:  Moderate Onset quality:  Gradual Duration:  3 days Timing:  Constant Progression:  Worsening Chronicity:  New Relieved by:  None tried Worsened by:  Nothing tried Ineffective treatments:  None tried Associated symptoms: no diarrhea, no fever, no nausea, no vaginal bleeding and no vomiting   Risk factors: pregnancy    HPI Comments: Cathy Nash is a 35 y.o. female who is 4 months pregnant who presents to the Emergency Department complaining of gradually worsening left lower abdominal pain onset 3 days ago. She reports that she was seen at [facility] 3 days ago for the same abdominal pain where she had vaginal bleeding and discharged with Flexeril PRN. She denies any current vaginal bleeding. No alleviating factors noted. Pt has not tried any medications PTA. No n/v/d or any other complaints at this time.    Past Medical History  Diagnosis Date  . Asthma   . Asthma   . Preterm labor     1st pregnancy d/t pre-e  . Pregnancy induced hypertension     previous pregnancy  . Pyelonephritis     June 2013  . Varicose veins   . Vaginal pessary in situ   . Anxiety    Past Surgical History  Procedure Laterality Date  . No past surgeries     Family History  Problem Relation Age of Onset  . Anesthesia problems Neg Hx   . Hypotension Neg Hx   . Malignant hyperthermia Neg Hx   . Pseudochol  deficiency Neg Hx   . Other Neg Hx   . Diabetes Mother   . Asthma Father    Social History  Substance Use Topics  . Smoking status: Former Games developer  . Smokeless tobacco: Never Used     Comment: yrs ago  . Alcohol Use: No   OB History    Gravida Para Term Preterm AB TAB SAB Ectopic Multiple Living   0 0 0 0 0 4     Review of Systems  Constitutional: Negative for fever.  Gastrointestinal: Positive for abdominal pain. Negative for nausea, vomiting and diarrhea.  Genitourinary: Negative for vaginal bleeding.  All other systems reviewed and are negative.     Allergies  Iohexol and Latex  Home Medications   Prior to Admission medications   Medication Sig Start Date End Date Taking? Authorizing Provider  cyclobenzaprine (FLEXERIL) 10 MG tablet Take 1 tablet (10 mg total) by mouth 2 (two) times daily as needed for muscle spasms. 05/26/16  Yes Armando Reichert, CNM  diphenhydramine-acetaminophen (TYLENOL PM) 25-500 MG TABS tablet Take 2 tablets by mouth at bedtime as needed (pain).    Yes Historical Provider, MD  metoCLOPramide (REGLAN) 10 MG tablet Take 1 tablet (10 mg total) by mouth every 8 (eight) hours as needed for nausea. 03/17/16  Yes Hope Orlene Och, NP  Prenatal Vit-Fe Fumarate-FA (PRENATAL MULTIVITAMIN) TABS  tablet Take 1 tablet by mouth daily at 12 noon.   Yes Historical Provider, MD  cephALEXin (KEFLEX) 500 MG capsule Take 2 capsules (1,000 mg total) by mouth 2 (two) times daily. 05/31/16   Gilda Creasehristopher J Pollina, MD  promethazine (PHENERGAN) 25 MG tablet Take 1 tablet (25 mg total) by mouth every 6 (six) hours as needed for nausea or vomiting. Patient not taking: Reported on 04/13/2016 03/24/16   Jean RosenthalSusan P Lineberry, NP   BP 101/70 mmHg  Pulse 67  Temp(Src) 98.1 F (36.7 C) (Oral)  Resp 20  SpO2 100%  LMP 01/18/2016 (Approximate) Physical Exam  Constitutional: She is oriented to person, place, and time. She appears well-developed and well-nourished. No distress.  HENT:   Head: Normocephalic and atraumatic.  Right Ear: Hearing normal.  Left Ear: Hearing normal.  Nose: Nose normal.  Mouth/Throat: Oropharynx is clear and moist and mucous membranes are normal.  Eyes: Conjunctivae and EOM are normal. Pupils are equal, round, and reactive to light.  Neck: Normal range of motion. Neck supple.  Cardiovascular: Regular rhythm, S1 normal and S2 normal.  Exam reveals no gallop and no friction rub.   No murmur heard. Pulmonary/Chest: Effort normal and breath sounds normal. No respiratory distress. She exhibits no tenderness.  Abdominal: Soft. Normal appearance and bowel sounds are normal. There is no hepatosplenomegaly. There is tenderness. There is no rebound, no guarding, no tenderness at McBurney's point and negative Murphy's sign. No hernia.  Diffuse left sided tenderness  Musculoskeletal: Normal range of motion.  Neurological: She is alert and oriented to person, place, and time. She has normal strength. No cranial nerve deficit or sensory deficit. Coordination normal. GCS eye subscore is 4. GCS verbal subscore is 5. GCS motor subscore is 6.  Skin: Skin is warm, dry and intact. No rash noted. No cyanosis.  Psychiatric: She has a normal mood and affect. Her speech is normal and behavior is normal. Thought content normal.  Nursing note and vitals reviewed.   ED Course  Procedures (including critical care time) DIAGNOSTIC STUDIES: Oxygen Saturation is 98% on RA, normal by my interpretation.    COORDINATION OF CARE: 2:12 AM- Pt advised of plan for treatment and pt agrees. Pt will receive lab work for further evaluation.   Labs Review Labs Reviewed  COMPREHENSIVE METABOLIC PANEL - Abnormal; Notable for the following:    Potassium 3.3 (*)    CO2 20 (*)    Glucose, Bld 121 (*)    Calcium 8.8 (*)    Total Protein 6.3 (*)    Albumin 3.3 (*)    ALT 11 (*)    All other components within normal limits  CBC - Abnormal; Notable for the following:    RBC 3.82 (*)     Hemoglobin 11.6 (*)    HCT 34.6 (*)    All other components within normal limits  URINALYSIS, ROUTINE W REFLEX MICROSCOPIC (NOT AT [facility]) - Abnormal; Notable for the following:    APPearance CLOUDY (*)    Specific Gravity, Urine 1.033 (*)    Hgb urine dipstick MODERATE (*)    Ketones, ur 40 (*)    Nitrite POSITIVE (*)    Leukocytes, UA TRACE (*)    All other components within normal limits  URINE MICROSCOPIC-ADD ON - Abnormal; Notable for the following:    Squamous Epithelial / LPF 6-30 (*)    Bacteria, UA MANY (*)    All other components within normal limits  I-STAT BETA HCG BLOOD, ED ([facility],  [facility], [facility] ONLY) - Abnormal; Notable for the following:    I-stat hCG, quantitative >2000.0 (*)    All other components within normal limits  URINE CULTURE  LIPASE, BLOOD    Imaging Review No results found.   Gilda Crease, MD has personally reviewed and evaluated these images and lab results as part of his medical decision-making.   EKG Interpretation None      MDM   Final diagnoses:  UTI (lower urinary tract infection)    Patient presents to the ER for evaluation of lower abdominal pain. Patient also expressed and painful urination. Patient was seen at [facility] 3 days ago for similar symptoms and diagnosed with round ligament pain. She did apparently have blood in her urine at that time. Pregnancy ultrasound and renal ultrasound were negative. No sign of obstruction in the ureter or hydronephrosis. Repeat urinalysis today, however, shows obvious infection. Symptoms likely secondary to lower urinary tract infection without signs of pyelonephritis or sepsis. Patient treated with Rocephin here in the ER will continue Keflex as an outpatient. Follow-up with OB/GYN. Patient had easily identified fetal heart tones here and does not have any obvious signs of obstruction or complication.  I personally performed the services described in this documentation, which was scribed in my  presence. The recorded information has been reviewed and is accurate.   Gilda Crease, MD 05/31/16 [phone]

## 2016-05-31 NOTE — ED Notes (Signed)
Pt departed in NAD.  

## 2016-05-31 NOTE — ED Notes (Signed)
OBRR RN dopplered FHT = 150-160.

## 2016-05-31 NOTE — Discharge Instructions (Signed)
  Infeccin urinaria  (Urinary Tract Infection)  La infeccin urinaria puede ocurrir en cualquier lugar del tracto urinario. El tracto urinario es un sistema de drenaje del cuerpo por el que se eliminan los desechos y el exceso de agua. El tracto urinario est formado por dos riones, dos urteres, la vejiga y la uretra. Los riones son rganos que tienen forma de frijol. Cada rin tiene aproximadamente el tamao del puo. Estn situados debajo de las costillas, uno a cada lado de la columna vertebral CAUSAS  La causa de la infeccin son los microbios, que son organismos microscpicos, que incluyen hongos, virus, y bacterias. Estos organismos son tan pequeos que slo pueden verse a travs del microscopio. Las bacterias son los microorganismos que ms comnmente causan infecciones urinarias.  SNTOMAS  Los sntomas pueden variar segn la edad y el sexo del paciente y por la ubicacin de la infeccin. Los sntomas en las mujeres jvenes incluyen la necesidad frecuente e intensa de orinar y una sensacin dolorosa de ardor en la vejiga o en la uretra durante la miccin. Las mujeres y los hombres mayores podrn sentir cansancio, temblores y debilidad y sentir dolores musculares y dolor abdominal. Si tiene fiebre, puede significar que la infeccin est en los riones. Otros sntomas son dolor en la espalda o en los lados debajo de las costillas, nuseas y vmitos.  DIAGNSTICO  Para diagnosticar una infeccin urinaria, el mdico le preguntar acerca de sus sntomas. Tambin le solicitar una muestra de orina. La muestra de orina se analiza para detectar bacterias y glbulos blancos de la sangre. Los glbulos blancos se forman en el organismo para ayudar a combatir las infecciones.  TRATAMIENTO  Por lo general, las infecciones urinarias pueden tratarse con medicamentos. Debido a que la mayora de las infecciones son causadas por bacterias, por lo general pueden tratarse con antibiticos. La eleccin del  antibitico y la duracin del tratamiento depender de sus sntomas y el tipo de bacteria causante de la infeccin.  INSTRUCCIONES PARA EL CUIDADO EN EL HOGAR   Si le recetaron antibiticos, tmelos exactamente como su mdico le indique. Termine el medicamento aunque se sienta mejor despus de haber tomado slo algunos.  Beba gran cantidad de lquido para mantener la orina de tono claro o color amarillo plido.  Evite la cafena, el t y las bebidas gaseosas. Estas sustancias irritan la vejiga.  Vaciar la vejiga con frecuencia. Evite retener la orina durante largos perodos.  Vace la vejiga antes y despus de tener relaciones sexuales.  Despus de mover el intestino, las mujeres deben higienizarse la regin perineal desde adelante hacia atrs. Use slo un papel tissue por vez. SOLICITE ATENCIN MDICA SI:   Siente dolor en la espalda.  Le sube la fiebre.  Los sntomas no mejoran luego de 3 das. SOLICITE ATENCIN MDICA DE INMEDIATO SI:   Siente dolor intenso en la espalda o en la zona inferior del abdomen.  Comienza a sentir escalofros.  Tiene nuseas o vmitos.  Tiene una sensacin continua de quemazn o molestias al orinar. ASEGRESE DE QUE:   Comprende estas instrucciones.  Controlar su enfermedad.  Solicitar ayuda de inmediato si no mejora o empeora.   Esta informacin no tiene como fin reemplazar el consejo del mdico. Asegrese de hacerle al mdico cualquier pregunta que tenga.   Document Released: 09/07/2005 Document Revised: 08/22/2012 Elsevier Interactive Patient Education 2016 Elsevier Inc.  

## 2016-05-31 NOTE — ED Notes (Signed)
Pt c/o lower abd pain and pain with urination. Pt states she is 4 months pregnant.

## 2016-06-01 LAB — URINE CULTURE

## 2016-06-15 ENCOUNTER — Encounter: Payer: Medicaid Other | Admitting: Obstetrics & Gynecology

## 2016-07-11 ENCOUNTER — Ambulatory Visit (INDEPENDENT_AMBULATORY_CARE_PROVIDER_SITE_OTHER): Payer: Self-pay | Admitting: Obstetrics and Gynecology

## 2016-07-11 ENCOUNTER — Encounter: Payer: Self-pay | Admitting: Obstetrics and Gynecology

## 2016-07-11 VITALS — BP 102/75 | HR 75 | Temp 98.2°F | Wt 134.2 lb

## 2016-07-11 DIAGNOSIS — O09899 Supervision of other high risk pregnancies, unspecified trimester: Secondary | ICD-10-CM

## 2016-07-11 DIAGNOSIS — Z789 Other specified health status: Secondary | ICD-10-CM

## 2016-07-11 DIAGNOSIS — O09299 Supervision of pregnancy with other poor reproductive or obstetric history, unspecified trimester: Secondary | ICD-10-CM | POA: Insufficient documentation

## 2016-07-11 DIAGNOSIS — O0992 Supervision of high risk pregnancy, unspecified, second trimester: Secondary | ICD-10-CM

## 2016-07-11 DIAGNOSIS — O0932 Supervision of pregnancy with insufficient antenatal care, second trimester: Secondary | ICD-10-CM

## 2016-07-11 DIAGNOSIS — O09529 Supervision of elderly multigravida, unspecified trimester: Secondary | ICD-10-CM | POA: Insufficient documentation

## 2016-07-11 DIAGNOSIS — O09292 Supervision of pregnancy with other poor reproductive or obstetric history, second trimester: Secondary | ICD-10-CM

## 2016-07-11 DIAGNOSIS — O99519 Diseases of the respiratory system complicating pregnancy, unspecified trimester: Secondary | ICD-10-CM

## 2016-07-11 DIAGNOSIS — O9989 Other specified diseases and conditions complicating pregnancy, childbirth and the puerperium: Secondary | ICD-10-CM

## 2016-07-11 DIAGNOSIS — Z113 Encounter for screening for infections with a predominantly sexual mode of transmission: Secondary | ICD-10-CM

## 2016-07-11 DIAGNOSIS — J45909 Unspecified asthma, uncomplicated: Secondary | ICD-10-CM

## 2016-07-11 DIAGNOSIS — O09522 Supervision of elderly multigravida, second trimester: Secondary | ICD-10-CM

## 2016-07-11 DIAGNOSIS — O0930 Supervision of pregnancy with insufficient antenatal care, unspecified trimester: Secondary | ICD-10-CM | POA: Insufficient documentation

## 2016-07-11 LAB — POCT URINALYSIS DIP (DEVICE)
Bilirubin Urine: NEGATIVE
Glucose, UA: 100 mg/dL — AB
KETONES UR: NEGATIVE mg/dL
Leukocytes, UA: NEGATIVE
Nitrite: NEGATIVE
PH: 6 (ref 5.0–8.0)
PROTEIN: NEGATIVE mg/dL
Specific Gravity, Urine: 1.015 (ref 1.005–1.030)
Urobilinogen, UA: 1 mg/dL (ref 0.0–1.0)

## 2016-07-11 LAB — COMPLETE METABOLIC PANEL WITH GFR
ALT: 7 U/L (ref 6–29)
AST: 12 U/L (ref 10–30)
Albumin: 3.5 g/dL — ABNORMAL LOW (ref 3.6–5.1)
Alkaline Phosphatase: 66 U/L (ref 33–115)
BUN: 7 mg/dL (ref 7–25)
CHLORIDE: 104 mmol/L (ref 98–110)
CO2: 22 mmol/L (ref 20–31)
CREATININE: 0.53 mg/dL (ref 0.50–1.10)
Calcium: 8.4 mg/dL — ABNORMAL LOW (ref 8.6–10.2)
GFR, Est African American: >89 mL/min (ref >=60)
GFR, Est Non African American: >89 mL/min (ref >=60)
Glucose, Bld: 93 mg/dL (ref 65–99)
Potassium: 3.5 mmol/L (ref 3.5–5.3)
Sodium: 135 mmol/L (ref 135–146)
Total Bilirubin: 0.4 mg/dL (ref 0.2–1.2)
Total Protein: 5.9 g/dL — ABNORMAL LOW (ref 6.1–8.1)

## 2016-07-11 LAB — TSH: TSH: 1.24 mIU/L

## 2016-07-11 LAB — T4, FREE: FREE T4: 0.8 ng/dL (ref 0.8–1.8)

## 2016-07-11 MED ORDER — ASPIRIN EC 81 MG PO TBEC: 81.0000 mg | DELAYED_RELEASE_TABLET | Freq: Every day | ORAL | 3 refills | Status: DC

## 2016-07-11 NOTE — Addendum Note (Signed)
Addended by: Garret Reddish on: 07/11/2016 02:36 PM   Modules accepted: Orders

## 2016-07-11 NOTE — Progress Notes (Signed)
Pt c/o varicosities in vulvar area and legs, thinks she may have been running fever the last 2 days Initial prenatal labs today Schedule u/s today 17-p application?

## 2016-07-11 NOTE — Progress Notes (Addendum)
New OB Note  07/11/2016   Clinic[facility]  Chief Complaint: New OB  Transfer of Care Patient: no  History of Present Illness: Cathy Nash is a 35 y.o. [id number] @ 2/3 weeks (EDC 11/24, based on 6wk u/s)  Patient's last menstrual period was 01/18/2016 (approximate). Preg complicated by has Rectocele, grade 3; Supervision of low-risk pregnancy; Asthma affecting pregnancy, antepartum; Short interval between pregnancies affecting pregnancy, antepartum; History of preterm premature rupture of membranes (PROM) in previous pregnancy, currently pregnant; and Insufficient prenatal care on her problem list. and a h/o pre-eclampsia.   Her periods were: regular, qmonth She was using no method when she conceived.  She has Negative signs or symptoms of nausea/vomiting of pregnancy. She has Negative signs or symptoms of miscarriage or preterm labor  ROS: A 12-point review of systems was performed and negative, except as stated in the above HPI.  OBGYN History: As per HPI. OB History  Gravida Para Term Preterm AB Living  0 4  SAB TAB Ectopic Multiple Live Births  0 0 0 0 4    # Outcome Date GA Lbr Len/2nd Weight Sex Delivery Anes PTL Lv  5 Current           4 Preterm 10/15/15 [redacted]w[redacted]d 03:17 / 00:09 4 lb 14.3 oz (2.22 kg) M Vag-Spont EPI  LIV  3 Term 02/21/14 [redacted]w[redacted]d 06:35 / 01:00 5 lb 10.8 oz (2.575 kg) M Vag-Spont Other  LIV  2 Term 08/17/12 [redacted]w[redacted]d 05:51 / 00:06 5 lb 8 oz (2.495 kg) M Vag-Spont None  LIV  1 Preterm 07/04/06 [redacted]w[redacted]d  5 lb (2.268 kg) M Vag-Spont  N LIV      Any prior children are healthy, doing well, without any problems or issues: yes History of pap smears: Yes. Last pap smear 09/2013 and it was normal  History of STIs: No   Past Medical History: Past Medical History:  Diagnosis Date  . Anxiety   . Asthma   . Asthma   . Pregnancy induced hypertension    previous pregnancy  . Preterm labor    1st pregnancy d/t pre-e  . Pyelonephritis    June 2013  .  Vaginal pessary in situ   . Varicose veins     Past Surgical History: Past Surgical History:  Procedure Laterality Date  . NO PAST SURGERIES      Family History:  Family History  Problem Relation Age of Onset  . Anesthesia problems Neg Hx   . Hypotension Neg Hx   . Malignant hyperthermia Neg Hx   . Pseudochol deficiency Neg Hx   . Other Neg Hx   . Diabetes Mother   . Asthma Father    She denies any history of mental retardation, birth defects or genetic disorders in her or the FOB's history  Social History:  Social History   Social History  . Marital status: Single    Spouse name: N/A  . Number of children: N/A  . Years of education: N/A   Occupational History  . Not on file.   Social History Main Topics  . Smoking status: Former Games developer  . Smokeless tobacco: Never Used     Comment: yrs ago  . Alcohol use No  . Drug use: No  . Sexual activity: Yes    Partners: Male    Birth control/ protection: None   Other Topics Concern  . Not on file   Social History Narrative   ** Merged History Encounter **       **  Merged History Encounter **        Allergy: Allergies  Allergen Reactions  . Iohexol Hives and Shortness Of Breath     Code: SOB, Desc: IMMEDIATELY SOB FOLLOWING IV INJECTION, SPOTTY HIVES, PT GIVEN BENADRYL AND EPI-ARS 08/22/09, Onset Date: [id number]   . Latex Itching and Rash    Health Maintenance:  Mammogram Up to Date: not applicable  Current Outpatient Medications: PNV  Physical Exam:   BP 102/75   Pulse 75   Temp 98.2 F (36.8 C)   Wt 134 lb 3.2 oz (60.9 kg)   LMP 01/18/2016 (Approximate)   BMI 24.55 kg/m  Body mass index is 24.55 kg/m. Fundal height: 22 FHTs: 140s  General appearance: Well nourished, well developed female in no acute distress.  Neck:  Supple, normal appearance, and no thyromegaly  Cardiovascular: S1, S2 normal, no murmur, rub or gallop, regular rate and rhythm Respiratory:  Clear to auscultation bilateral.  Normal respiratory effort Abdomen: positive bowel sounds and no masses, hernias; diffusely non tender to palpation, non distended Breasts: breasts appear normal, no suspicious masses, no skin or nipple changes or axillary nodes, and inspection nofmral. Neuro/Psych:  Normal mood and affect.  Skin:  Warm and dry.  Lymphatic:  No inguinal lymphadenopathy.  Ext: varicosities seen in LEs. No induration or tenderness in  Pelvic exam: is not limited by body habitus EGBUS: within normal limits, Vagina: within normal limits and with no blood in the vault, Cervix: normal appearing cervix without discharge or lesions, closed/long/high (1cm externally), Uterus:  enlarged, c/w 22 week size, and Adnexa:  normal adnexa  Laboratory: As above. Normal CBC, UCx, CMP (except glucose 121 but not fasting), GC/CT in June 2017  Imaging:  As above. SLIUP seen on ultrasounds  Assessment: Pt stable  Plan: 1. Supervision of High-risk pregnancy, second trimester Routine PNC. Prenatal panal, anatomy ultrasound  2. History of preterm premature rupture of membranes (PROM) in previous pregnancy, currently pregnant, second trimester Too late to start 17p since over 20wks. Follow up cervical length at anatomy u/s  3. Insufficient prenatal care, second trimester UDS today. Will do 1hr today given her history and may not come back to clinic. Repeat in one month D/w pt that unable to do certain screening interventions to hopefully prevent high risk pregnancies due to [facility] starting  4. Short interval between pregnancies affecting pregnancy, antepartum 10/2015. Increases risk for PTL/PTB  5. Asthma affecting pregnancy, antepartum No issues  6. AMA (advanced maternal age) multigravida 35+, second trimester Refer to genetics (pt amenable). Follow up anatomy u/s. Baseline pre-eclampsia labs, TFTs  7. H/o pre-eclampsia Start baby ASA  Problem list reviewed and updated.  Follow up in 2 weeks.  >50% of 25 min visit  spent on counseling and coordination of care.    Spanish interpreter used    Cornelia Copa MD Attending Center for [facility])

## 2016-07-12 LAB — PRENATAL PROFILE (SOLSTAS)
Antibody Screen: NEGATIVE
Basophils Absolute: 0 cells/uL (ref 0–200)
Basophils Relative: 0 %
EOS PCT: 1 %
Eosinophils Absolute: 72 cells/uL (ref 15–500)
HEMATOCRIT: 33.8 % — AB (ref 35.0–45.0)
HEMOGLOBIN: 11.1 g/dL — AB (ref 11.7–15.5)
HEP B S AG: NEGATIVE
HIV 1&2 Ab, 4th Generation: NONREACTIVE
LYMPHS ABS: 1512 {cells}/uL (ref 850–3900)
Lymphocytes Relative: 21 %
MCH: 31 pg (ref 27.0–33.0)
MCHC: 32.8 g/dL (ref 32.0–36.0)
MCV: 94.4 fL (ref 80.0–100.0)
MPV: 11.4 fL (ref 7.5–12.5)
Monocytes Absolute: 360 cells/uL (ref 200–950)
Monocytes Relative: 5 %
NEUTROS ABS: 5256 {cells}/uL (ref 1500–7800)
Neutrophils Relative %: 73 %
Platelets: 287 10*3/uL (ref 140–400)
RBC: 3.58 MIL/uL — AB (ref 3.80–5.10)
RDW: 13.7 % (ref 11.0–15.0)
Rh Type: POSITIVE
Rubella: 15.7 Index — ABNORMAL HIGH (ref <0.90)
WBC: 7.2 10*3/uL (ref 3.8–10.8)

## 2016-07-12 LAB — GLUCOSE TOLERANCE, 1 HOUR (50G) W/O FASTING: Glucose, 1 Hr, gestational: 85 mg/dL

## 2016-07-12 LAB — GC/CHLAMYDIA PROBE AMP (~~LOC~~) NOT AT ARMC
Chlamydia: NEGATIVE
Neisseria Gonorrhea: NEGATIVE

## 2016-07-14 LAB — HEMOGLOBINOPATHY EVALUATION
HCT: 33.8 % — ABNORMAL LOW (ref 35.0–45.0)
HGB A: 96.7 % (ref >96.0)
Hemoglobin: 11.1 g/dL — ABNORMAL LOW (ref 11.7–15.5)
Hgb A2 Quant: 2.3 % (ref 1.8–3.5)
MCH: 31 pg (ref 27.0–33.0)
MCV: 94.4 fL (ref 80.0–100.0)
RBC: 3.58 MIL/uL — ABNORMAL LOW (ref 3.80–5.10)
RDW: 13.7 % (ref 11.0–15.0)

## 2016-07-14 LAB — CYSTIC FIBROSIS DIAGNOSTIC STUDY

## 2016-07-22 ENCOUNTER — Encounter (HOSPITAL_COMMUNITY): Payer: Self-pay | Admitting: Certified Nurse Midwife

## 2016-07-25 ENCOUNTER — Ambulatory Visit (INDEPENDENT_AMBULATORY_CARE_PROVIDER_SITE_OTHER): Payer: Self-pay | Admitting: Family Medicine

## 2016-07-25 VITALS — BP 101/67 | HR 78 | Wt 135.0 lb

## 2016-07-25 DIAGNOSIS — O09522 Supervision of elderly multigravida, second trimester: Secondary | ICD-10-CM

## 2016-07-25 DIAGNOSIS — Z789 Other specified health status: Secondary | ICD-10-CM

## 2016-07-25 DIAGNOSIS — O09899 Supervision of other high risk pregnancies, unspecified trimester: Secondary | ICD-10-CM

## 2016-07-25 DIAGNOSIS — J45909 Unspecified asthma, uncomplicated: Secondary | ICD-10-CM

## 2016-07-25 DIAGNOSIS — O9989 Other specified diseases and conditions complicating pregnancy, childbirth and the puerperium: Secondary | ICD-10-CM

## 2016-07-25 DIAGNOSIS — O0932 Supervision of pregnancy with insufficient antenatal care, second trimester: Secondary | ICD-10-CM

## 2016-07-25 DIAGNOSIS — O0992 Supervision of high risk pregnancy, unspecified, second trimester: Secondary | ICD-10-CM

## 2016-07-25 DIAGNOSIS — IMO0001 Reserved for inherently not codable concepts without codable children: Secondary | ICD-10-CM

## 2016-07-25 DIAGNOSIS — O09292 Supervision of pregnancy with other poor reproductive or obstetric history, second trimester: Secondary | ICD-10-CM

## 2016-07-25 DIAGNOSIS — N816 Rectocele: Secondary | ICD-10-CM

## 2016-07-25 DIAGNOSIS — O99519 Diseases of the respiratory system complicating pregnancy, unspecified trimester: Secondary | ICD-10-CM

## 2016-07-25 NOTE — Progress Notes (Signed)
Subjective:  Cathy Nash is a 35 y.o. [phone] at [redacted]w[redacted]d being seen today for ongoing prenatal care.  She is currently monitored for the following issues for this high-risk pregnancy and has Rectocele, grade 3; Supervision of high-risk pregnancy; Asthma affecting pregnancy, antepartum; Short interval between pregnancies affecting pregnancy, antepartum; History of preterm premature rupture of membranes (PROM) in previous pregnancy, currently pregnant; Insufficient prenatal care; AMA (advanced maternal age) multigravida 35+; History of pre-eclampsia in prior pregnancy, currently pregnant; and Language barrier on her problem list.  Patient reports no bleeding, no contractions and no leaking.  Contractions: Not present. Vag. Bleeding: None.  Movement: Present. Denies leaking of fluid.   The following portions of the patient's history were reviewed and updated as appropriate: allergies, current medications, past family history, past medical history, past social history, past surgical history and problem list. Problem list updated.  Objective:   Vitals:   07/25/16 0851  BP: 101/67  Pulse: 78  Weight: 135 lb (61.2 kg)    Fetal Status: Fetal Heart Rate (bpm): 141   Movement: Present     General:  Alert, oriented and cooperative. Patient is in no acute distress.  Skin: Skin is warm and dry. No rash noted.   Cardiovascular: Normal heart rate noted  Respiratory: Normal respiratory effort, no problems with respiration noted  Abdomen: Soft, gravid, appropriate for gestational age. Pain/Pressure: Present     Pelvic:  Cervical exam deferred        Extremities: Normal range of motion.  Edema: None  Mental Status: Normal mood and affect. Normal behavior. Normal judgment and thought content.   Urinalysis:      Assessment and Plan:  Pregnancy: [id number] at [redacted]w[redacted]d  1. Supervision of high-risk pregnancy, second trimester - Anatomy US 8/18 scheduled   2. AMA (advanced maternal age) multigravida  35+, second trimester -desires to speak to MFM about risks and get NIPS performed  3. History of preterm premature rupture of membranes (PROM) in previous pregnancy, currently pregnant, second trimester  4. Asthma affecting pregnancy, antepartum - controlled  5. Short interval between pregnancies affecting pregnancy, antepartum - Delivered Nov 2016  6. History of pre-eclampsia in prior pregnancy, currently pregnant, second trimester  7. Language barrier  8. Rectocele, grade 3  9. Insufficient prenatal care, second trimester     There are no diagnoses linked to this encounter. Preterm labor symptoms and general obstetric precautions including but not limited to vaginal bleeding, contractions, leaking of fluid and fetal movement were reviewed in detail with the patient. Please refer to After Visit Summary for other counseling recommendations.  Return in about 4 weeks (around 08/22/2016) for Routine OB visit.   [facility]lizabeth Woodland [place] Nash, [place]

## 2016-07-25 NOTE — Patient Instructions (Signed)
Second Trimester of Pregnancy  The second trimester is from week 13 through week 28, month 4 through 6. This is often the time in pregnancy that you feel your best. Often times, morning sickness has lessened or quit. You may have more energy, and you may get hungry more often. Your unborn baby (fetus) is growing rapidly. At the end of the sixth month, he or she is about 9 inches long and weighs about 1½ pounds. You will likely feel the baby move (quickening) between 18 and 20 weeks of pregnancy.  HOME CARE   · Avoid all smoking, herbs, and alcohol. Avoid drugs not approved by your doctor.  · Do not use any tobacco products, including cigarettes, chewing tobacco, and electronic cigarettes. If you need help quitting, ask your doctor. You may get counseling or other support to help you quit.  · Only take medicine as told by your doctor. Some medicines are safe and some are not during pregnancy.  · Exercise only as told by your doctor. Stop exercising if you start having cramps.  · Eat regular, healthy meals.  · Wear a good support bra if your breasts are tender.  · Do not use hot tubs, steam rooms, or saunas.  · Wear your seat belt when driving.  · Avoid raw meat, uncooked cheese, and liter boxes and soil used by cats.  · Take your prenatal vitamins.  · Take 1500-2000 milligrams of calcium daily starting at the 20th week of pregnancy until you deliver your baby.  · Try taking medicine that helps you poop (stool softener) as needed, and if your doctor approves. Eat more fiber by eating fresh fruit, vegetables, and whole grains. Drink enough fluids to keep your pee (urine) clear or pale yellow.  · Take warm water baths (sitz baths) to soothe pain or discomfort caused by hemorrhoids. Use hemorrhoid cream if your doctor approves.  · If you have puffy, bulging veins (varicose veins), wear support hose. Raise (elevate) your feet for 15 minutes, 3-4 times a day. Limit salt in your diet.  · Avoid heavy lifting, wear low heals,  and sit up straight.  · Rest with your legs raised if you have leg cramps or low back pain.  · Visit your dentist if you have not gone during your pregnancy. Use a soft toothbrush to brush your teeth. Be gentle when you floss.  · You can have sex (intercourse) unless your doctor tells you not to.  · Go to your doctor visits.  GET HELP IF:   · You feel dizzy.  · You have mild cramps or pressure in your lower belly (abdomen).  · You have a nagging pain in your belly area.  · You continue to feel sick to your stomach (nauseous), throw up (vomit), or have watery poop (diarrhea).  · You have bad smelling fluid coming from your vagina.  · You have pain with peeing (urination).  GET HELP RIGHT AWAY IF:   · You have a fever.  · You are leaking fluid from your vagina.  · You have spotting or bleeding from your vagina.  · You have severe belly cramping or pain.  · You lose or gain weight rapidly.  · You have trouble catching your breath and have chest pain.  · You notice sudden or extreme puffiness (swelling) of your face, hands, ankles, feet, or legs.  · You have not felt the baby move in over an hour.  · You have severe headaches that do   not go away with medicine.  · You have vision changes.     This information is not intended to replace advice given to you by your health care provider. Make sure you discuss any questions you have with your health care provider.     Document Released: 02/22/2010 Document Revised: 12/19/2014 Document Reviewed: 01/29/2013  Elsevier Interactive Patient Education ©2016 Elsevier Inc.

## 2016-07-29 ENCOUNTER — Other Ambulatory Visit: Payer: Self-pay | Admitting: Obstetrics and Gynecology

## 2016-07-29 ENCOUNTER — Encounter (HOSPITAL_COMMUNITY): Payer: Self-pay

## 2016-07-29 ENCOUNTER — Ambulatory Visit (HOSPITAL_COMMUNITY)
Admission: RE | Admit: 2016-07-29 | Discharge: 2016-07-29 | Disposition: A | Discharge status: Home / Self Care | Payer: Self-pay | Source: Ambulatory Visit | Attending: Obstetrics and Gynecology | Admitting: Obstetrics and Gynecology

## 2016-07-29 DIAGNOSIS — Z3A26 26 weeks gestation of pregnancy: Secondary | ICD-10-CM | POA: Insufficient documentation

## 2016-07-29 DIAGNOSIS — O0992 Supervision of high risk pregnancy, unspecified, second trimester: Secondary | ICD-10-CM

## 2016-07-29 DIAGNOSIS — O09212 Supervision of pregnancy with history of pre-term labor, second trimester: Secondary | ICD-10-CM

## 2016-07-29 DIAGNOSIS — Z1389 Encounter for screening for other disorder: Secondary | ICD-10-CM

## 2016-07-29 DIAGNOSIS — O09522 Supervision of elderly multigravida, second trimester: Secondary | ICD-10-CM | POA: Insufficient documentation

## 2016-07-29 DIAGNOSIS — Z315 Encounter for genetic counseling: Secondary | ICD-10-CM | POA: Insufficient documentation

## 2016-07-29 DIAGNOSIS — O09292 Supervision of pregnancy with other poor reproductive or obstetric history, second trimester: Secondary | ICD-10-CM

## 2016-07-29 DIAGNOSIS — O09892 Supervision of other high risk pregnancies, second trimester: Secondary | ICD-10-CM

## 2016-07-29 NOTE — Progress Notes (Signed)
Genetic Counseling  High-Risk Gestation Note  Appointment Date:  07/29/2016 Referred By: Harriston BingPickens, Charlie, MD Date of Birth:  07/21/1981 Partner:  Fredrich RomansAdelfo   Pregnancy History: [id number] Estimated Date of Delivery: 11/04/16 Estimated Gestational Age: 3529w0d Attending: Particia NearingMartha Decker, MD  Ms. Rush BarerLizzeth Lopez-Jarquin and her partner were seen for genetic counseling because of a maternal age of 35 y.o..   [employer] English/Spanish interpreter, LakelandMariel, provided interpretation today.   In summary:  Discussed AMA and associated risk for fetal aneuploidy  Discussed options for screening  NIPS-declined  Ultrasound-performed today; complete results under separate cover  Discussed diagnostic testing options  Amniocentesis-declined  Reviewed family history concerns  They were counseled regarding maternal age and the association with risk for chromosome conditions due to nondisjunction with aging of the ova.   We reviewed chromosomes, nondisjunction, and the associated 1 in 141 risk for fetal aneuploidy related to a maternal age of 35 y.o. at 4929w0d gestation.  They were counseled that the risk for aneuploidy decreases as gestational age increases, accounting for those pregnancies which spontaneously abort.  We specifically discussed Down syndrome (trisomy 3521), trisomies 4013 and 9018, and sex chromosome aneuploidies (47,XXX and 47,XXY) including the common features and prognoses of each.   We reviewed available screening options including noninvasive prenatal screening (NIPS)/cell free DNA (cfDNA) screening and detailed ultrasound. They were counseled that screening tests are used to modify a patient's a priori risk for aneuploidy, typically based on age. This estimate provides a pregnancy specific risk assessment. We reviewed the benefits and limitations of each option. Specifically, we discussed the conditions for which each test screens, the detection rates, and false positive rates of each. They were also  counseled regarding diagnostic testing via amniocentesis. We reviewed the approximate 1 in 300-500 risk for complications from amniocentesis, including spontaneous pregnancy loss. We discussed the possible results that the tests might provide including: positive, negative, unanticipated, and no result. Finally, they were counseled regarding the cost of each option and potential out of pocket expenses.  Detailed ultrasound was performed today. Visualized fetal anatomy was within normal range. Complete ultrasound results reported under separate cover.  After consideration of all the options, they declined NIPS and amniocentesis.  They understand that screening tests cannot rule out all birth defects or genetic syndromes. The patient was advised of this limitation and states she still does not want additional testing at this time.   Ms. Rush BarerLizzeth Lopez-Jarquin was provided with written information regarding cystic fibrosis (CF), spinal muscular atrophy (SMA) and hemoglobinopathies including the carrier frequency, availability of carrier screening and prenatal diagnosis if indicated.  In addition, we discussed that CF and hemoglobinopathies are routinely screened for as part of the [place] newborn screening panel.  After further discussion, she declined screening for SMA. Screening was performed for CF and hemoglobinopathies through her OB and were within normal range.   Both family histories were reviewed and found to be noncontributory for birth defects, intellectual disability, and known genetic conditions. Without further information regarding the provided family history, an accurate genetic risk cannot be calculated. Further genetic counseling is warranted if more information is obtained.  Ms. Rush BarerLizzeth Lopez-Jarquin denied exposure to environmental toxins or chemical agents. She denied the use of alcohol, tobacco or street drugs. She denied significant viral illnesses during the course of her pregnancy. Her medical  and surgical histories were noncontributory.   I counseled this couple regarding the above risks and available options.  The approximate face-to-face time with the genetic counselor was 35 minutes.  Quinn PlowmanKaren Mina Babula, MS,  Certified Genetic Counselor 07/29/2016

## 2016-08-02 ENCOUNTER — Inpatient Hospital Stay (HOSPITAL_COMMUNITY)
Admission: AD | Admit: 2016-08-02 | Discharge: 2016-08-02 | Disposition: A | Discharge status: Home / Self Care | Payer: Self-pay | Source: Ambulatory Visit | Attending: Family Medicine | Admitting: Family Medicine

## 2016-08-02 ENCOUNTER — Encounter (HOSPITAL_COMMUNITY): Payer: Self-pay | Admitting: *Deleted

## 2016-08-02 ENCOUNTER — Other Ambulatory Visit (HOSPITAL_COMMUNITY): Payer: Self-pay | Admitting: *Deleted

## 2016-08-02 DIAGNOSIS — O26872 Cervical shortening, second trimester: Secondary | ICD-10-CM | POA: Insufficient documentation

## 2016-08-02 DIAGNOSIS — O09522 Supervision of elderly multigravida, second trimester: Secondary | ICD-10-CM | POA: Insufficient documentation

## 2016-08-02 DIAGNOSIS — Z833 Family history of diabetes mellitus: Secondary | ICD-10-CM | POA: Insufficient documentation

## 2016-08-02 DIAGNOSIS — O26879 Cervical shortening, unspecified trimester: Secondary | ICD-10-CM

## 2016-08-02 DIAGNOSIS — Z91041 Radiographic dye allergy status: Secondary | ICD-10-CM | POA: Insufficient documentation

## 2016-08-02 DIAGNOSIS — IMO0002 Reserved for concepts with insufficient information to code with codable children: Secondary | ICD-10-CM

## 2016-08-02 DIAGNOSIS — Z7982 Long term (current) use of aspirin: Secondary | ICD-10-CM | POA: Insufficient documentation

## 2016-08-02 DIAGNOSIS — O9882 Other maternal infectious and parasitic diseases complicating childbirth: Secondary | ICD-10-CM | POA: Insufficient documentation

## 2016-08-02 DIAGNOSIS — Z87891 Personal history of nicotine dependence: Secondary | ICD-10-CM | POA: Insufficient documentation

## 2016-08-02 DIAGNOSIS — Z91040 Latex allergy status: Secondary | ICD-10-CM | POA: Insufficient documentation

## 2016-08-02 DIAGNOSIS — B3731 Acute candidiasis of vulva and vagina: Secondary | ICD-10-CM

## 2016-08-02 DIAGNOSIS — Z3A26 26 weeks gestation of pregnancy: Secondary | ICD-10-CM | POA: Insufficient documentation

## 2016-08-02 DIAGNOSIS — Z825 Family history of asthma and other chronic lower respiratory diseases: Secondary | ICD-10-CM | POA: Insufficient documentation

## 2016-08-02 DIAGNOSIS — B373 Candidiasis of vulva and vagina: Secondary | ICD-10-CM

## 2016-08-02 LAB — URINALYSIS, ROUTINE W REFLEX MICROSCOPIC
BILIRUBIN URINE: NEGATIVE
Glucose, UA: 500 mg/dL — AB
Ketones, ur: NEGATIVE mg/dL
Nitrite: NEGATIVE
PH: 6 (ref 5.0–8.0)
Protein, ur: NEGATIVE mg/dL

## 2016-08-02 LAB — WET PREP, GENITAL
SPERM: NONE SEEN
TRICH WET PREP: NONE SEEN
YEAST WET PREP: NONE SEEN

## 2016-08-02 LAB — URINE MICROSCOPIC-ADD ON

## 2016-08-02 MED ORDER — TERCONAZOLE 0.8 % VA CREA: 1.0000 | TOPICAL_CREAM | Freq: Every day | VAGINAL | 0 refills | Status: DC

## 2016-08-02 NOTE — Discharge Instructions (Signed)
Vaginitis monilisica (Monilial Vaginitis) La vaginitis es una inflamacin (irritacin, hinchazn) de la vagina y la vulva. Esta no es una enfermedad de transmisin sexual.  CAUSAS Este tipo de vaginitis lo causa un hongo (candida) que normalmente se encuentra en la vagina. El hongo candida se ha desarrollado hasta el punto de ocasionar problemas en el equilibrio qumico. SNTOMAS  Secrecin vaginal espesa y blanca.  Hinchazn, picazn, enrojecimiento e inflamacin de la vagina y en algunos casos de los labios vaginales (vulva).  Ardor o dolor al ConocoPhillipsorinar.  Dolor en las relaciones sexuales. DIAGNSTICO Los factores que favorecen la vaginitis moniliasica son:  Everlean Pattersontapas de virginidad y postmenopusicas.  Embarazo.  Infecciones.  Sentir cansancio, estar enferma o estresada, especialmente si ya ha sufrido este problema en el pasado.  Diabetes Buen control ayudar a disminur la probabilidad.  Pldoras anticonceptivas  Ropa interior [place] Islandsmuy ajustada.  El uso de espumas de bao, aerosoles femeninos duchas vaginales o tampones con desodorante.  Algunos antibiticos (medicamentos que destruyen grmenes).  Si contrae alguna enfermedad puede sufrir recurrencias espordicas. TRATAMIENTO El profesional que lo asiste prescribir medicamentos.  Hay diferentes tipos de cremas y supositorios vaginales que tratan especficamente la vaginitis monilisica. Para infecciones por hongos recurrentes, utilice un supositorio o crema en la vagina dos veces por semana, o segn se le indique.  Tambin podrn utilizarse cremas con corticoides o anti monilisicas para la picazn o la irritacin de la vulva. Consulte con el profesional que la asiste.  Si la crema no da resultado, podr aplicarse en la vagina una solucin con azul de metileno.  El consumo de yogur puede prevenir este tipo de vaginitis. INSTRUCCIONES PARA EL CUIDADO DOMICILIARIO  Tome todos los medicamentos tal como se le indic.  No  mantenga relaciones sexuales hasta que el tratamiento se haya completado, o segn las indicaciones del profesional que la asiste.  Tome baos de asiento tibios.  No se aplique duchas vaginales.  No utilice tampones, especialmente los perfumados.  Use ropa interior de algodn  [employer] los pantalones ajustados y las medias tipo panty.  Comunique a sus compaeros sexuales que sufre una infeccin por hongos. Ellos deben concurrir para un control mdico si tienen sntomas como una urticaria leve o picazn.  Sus compaeros sexuales deben tratarse tambin si la infeccin es difcil de Pharmacologisteliminar.  Practique el sexo seguro - use condones  Algunos medicamentos vaginales ocasionan fallas en los condones de ltex. Los medicamentos vaginales que pueden daar los condones son:  ChiropodistCrema cleocina  Butoconazole (Femstat)  Terconazole (Terazol) supositorios vaginales  Miconazole (Monistat) (es un medicamento de venta libre) SOLICITE ATENCIN MDICA SI:  Daphane ShepherdUsted tiene una temperatura oral de ms de 38,9 C (102 F).  Si la infeccin empeora luego de 2 [address] de tratamiento.  Si la infeccin no mejora luego de 3 [address] de tratamiento.  Aparecen ampollas en o alrededor de la vagina.  Si aparece una hemorragia vaginal y no es el momento del perodo.  Siente dolor al ConocoPhillipsorinar.  Presenta problemas intestinales.  Tiene dolor durante las The St. Paul Travelersrelaciones sexuales.   Esta informacin no tiene Theme park managercomo fin reemplazar el consejo del mdico. Asegrese de hacerle al mdico cualquier pregunta que tenga.   Document Released: 09/07/2005 Document Revised: 02/20/2012 Elsevier Interactive Patient Education 2016 [employer]vier Inc.  Insuficiencia cervical (Cervical Insufficiency)  Se llama insuficiencia cervical cuando el cuello del tero se debilita y comienza a abrirse (dilatarse) y a Geographical information systems officerhacerse ms delgado (borrarse) antes de que Firefighterel embarazo llegue a trmino y sin que se inicie el [place]rabajo  de parto. Tambin se llama crvix  incompetente. Puede ocurrir en el segundo o tercer trimestre del Pin[place]embarazo, cuando el feto comienza a presionar sobre el cuello del tero. Puede ser causa de aborto, de rotura prematura de las membranas pretrmino (RPMP), o un nacimiento prematuro (nacimiento pretrmino).  FACTORES DE RIESGO  Tendr ms riesgo de sufrir insuficiencia cervical si:   El cuello del tero es ms corto que lo normal.  Ha sufrido un dao o lesin en el cuello del tero en un embarazo o ciruga anteriores.  Ha nacido con un defecto en el cuello del tero.  Le han realizado algn procedimiento en el cuello del tero, como una biopsia.  Tiene una historia de insuficiencia cervical.  Tiene una historia de RPMP.  Ha interrumpido varios embarazos anteriores con un aborto.  Ha estado expuesta a la droga dietiletilbestrol (DES). SNTOMAS  Muchas veces la mujer no presenta sntomas. Otras veces hay sntomas leves que generalmente comienzan entre la semana 14 y la 20 del Psychiatristembarazo. Los sntomas pueden durar [address]arios das o [address]emanas. Estos sntomas son:   Victorio PalmLigero manchado o sangrado por la vagina.  Presin en la pelvis.  Un cambio en el aspecto en el flujo vaginal, como una secrecin que cambia de ser clara, blanca o amarillo claro a rosada o marrn.  Dolor de espalda.  Dolor o clicos abdominales. DIAGNSTICO  La insuficiencia cervical no puede diagnosticarse antes de quedar embarazada. Cuando est embarazada, el mdico le har una historia clnica y preguntar si ha sufrido problemas en embarazos anteriores. Informe a su mdico acerca de todo procedimiento que le hayan practicado en el cuello del tero, o si tiene historia de abortos espontneos o de insuficiencia cervical. Si el mdico considera que tiene alto riesgo de sufrir insuficiencia cervical o presenta sntomas de insuficiencia cervical, podr indicarle:   Realizar un examen plvico Durante el mismo verificar:  Si hay membranas (saco amnitico) saliendo por  el cuello del tero.  Anormalidades en el cuello del tero.  Lesiones en el cuello del tero.  La presencia de contracciones.  Realizar una ecografa para medir el largo y el grosor del cuello del tero. TRATAMIENTO  Si ha sido diagnosticada con insuficiencia cervical, el mdico podr indicar:   Limitar la actividad fsica.  Hacer reposo en cama, en su casa o en el hospital.  Hacer reposo plvico, lo que significa no tener relaciones sexuales no colocar nada dentro de la vagina.  Un cerclaje en el que se sutura el cuello del tero para cerrarlo e impedir que se abra anticipadamente. Los puntos (suturas) se retiran entre la semana 36 y la 38 para evitar problemas durante el Donahuetrabajo de Bal[place] Springsparto. El cerclaje puede recomendarse durante el embarazo si usted tiene una historia de abortos espontneos o partos prematuros sin causa aparente. Tambin puede indicarse si tiene un cuello uterino corto, diagnosticado con una ecografa o si el mdico ha hallado que su cuello se ha dilatado antes de las 24 semanas del [place]embarazo. Limitar la actividad fsica y Radio producerhacer reposo en cama puede ayudar o no a impedir un Sport and exercise psychologistparto prematuro.  CUNDO DEBE BUSCAR ASISTENCIA MDICA INMEDIATA?  Solicite atencin mdica de inmediato si tiene sntomas de insuficiencia cervical. Debe concurrir al hospital para ser controlada inmediatamente.    Esta informacin no tiene Theme park managercomo fin reemplazar el consejo del mdico. Asegrese de hacerle al mdico cualquier pregunta que tenga.   Document Released: 11/28/2005 Document Revised: 12/19/2014 Elsevier Interactive Patient Education [employer]2016 Elsevier Inc.

## 2016-08-02 NOTE — MAU Note (Signed)
PT  SAYS     WITH INTERPRETER- VIRIA-      SAYS VAGINA  STARTED ITCHING   ON Sunday-      SO YESTERDAY  WENT  TO B-ROOM - SAW PINK   D/C  WHEN  SHE WIPED.    THEN AS SHE WALKED  AROUND - FELT WATER COME  OUT     AND  AGAIN THIS  AM.         ON Friday- WENT  TO CLINIC -  SAID  CERVIX OPEN   -  RX  FOR  MED IN VAGINA   - HAS  USED.       LAST SEX-    2 WEEKS  AGO.      NOW CRAMPING - STARTED  THIS  AM-     THE SAME  NOW.

## 2016-08-02 NOTE — MAU Provider Note (Signed)
Obstetric Attending MAU Note  Chief Complaint: vaginal itching   HPI: Cathy Nash is a 35 y.o. [id number] at [redacted]w[redacted]d who presents to maternity admissions reporting 3 day h/o vaginal itching. Placed on vaginal prometrium a few days ago for shortened cervix. H/o PPROM at 34 wks.. Denies contractions, leakage of fluid or vaginal bleeding. Good fetal movement. Feels pelvic pressure.  Pregnancy Course: Receives care at [facility] Patient Active Problem List   Diagnosis Date Noted  . History of preterm premature rupture of membranes (PROM) in previous pregnancy, currently pregnant 07/11/2016  . Insufficient prenatal care 07/11/2016  . AMA (advanced maternal age) multigravida 35+ 07/11/2016  . History of pre-eclampsia in prior pregnancy, currently pregnant 07/11/2016  . Language barrier 07/11/2016  . Short interval between pregnancies affecting pregnancy, antepartum 08/31/2015  . Supervision of high-risk pregnancy 06/01/2015  . Asthma affecting pregnancy, antepartum 06/01/2015  . Rectocele, grade 3 03/05/2015    Past Medical History:  Diagnosis Date  . Anxiety   . Asthma   . Asthma   . Pregnancy induced hypertension    previous pregnancy  . Preterm labor    1st pregnancy d/t pre-e  . Pyelonephritis    June 2013  . Vaginal pessary in situ   . Varicose veins     OB History  Gravida Para Term Preterm AB Living  5 4 2 2  0 4  SAB TAB Ectopic Multiple Live Births  0 0 0 0 4    # Outcome Date GA Lbr Len/2nd Weight Sex Delivery Anes PTL Lv  5 Current           4 Preterm 10/15/15 [redacted]w[redacted]d 03:17 / 00:09 4 lb 14.3 oz (2.22 kg) M Vag-Spont EPI  LIV  3 Term 02/21/14 [redacted]w[redacted]d 06:35 / 01:00 5 lb 10.8 oz (2.575 kg) M Vag-Spont Other  LIV  2 Term 08/17/12 [redacted]w[redacted]d 05:51 / 00:06 5 lb 8 oz (2.495 kg) M Vag-Spont None  LIV  1 Preterm 07/04/06 [redacted]w[redacted]d  5 lb (2.268 kg) M Vag-Spont  N LIV      Past Surgical History:  Procedure Laterality Date  . NO PAST SURGERIES      Family History: Family History   Problem Relation Age of Onset  . Diabetes Mother   . Asthma Father   . Anesthesia problems Neg Hx   . Hypotension Neg Hx   . Malignant hyperthermia Neg Hx   . Pseudochol deficiency Neg Hx   . Other Neg Hx     Social History: Social History  Substance Use Topics  . Smoking status: Former Games developer  . Smokeless tobacco: Never Used     Comment: yrs ago  . Alcohol use No    Allergies:  Allergies  Allergen Reactions  . Iohexol Hives and Shortness Of Breath     Code: SOB, Desc: IMMEDIATELY SOB FOLLOWING IV INJECTION, SPOTTY HIVES, PT GIVEN BENADRYL AND EPI-ARS 08/22/09, Onset Date: [id number]   . Latex Itching and Rash    Prescriptions Prior to Admission  Medication Sig Dispense Refill Last Dose  . aspirin EC 81 MG tablet Take 1 tablet (81 mg total) by mouth daily. 60 tablet 3 Taking  . cephALEXin (KEFLEX) 500 MG capsule Take 2 capsules (1,000 mg total) by mouth 2 (two) times daily. (Patient not taking: Reported on 07/29/2016) 40 capsule 0 Not Taking  . cyclobenzaprine (FLEXERIL) 10 MG tablet Take 1 tablet (10 mg total) by mouth 2 (two) times daily as needed for muscle spasms. (Patient not taking: Reported on 07/11/2016)  20 tablet 0 Not Taking  . diphenhydramine-acetaminophen (TYLENOL PM) 25-500 MG TABS tablet Take 2 tablets by mouth at bedtime as needed (pain).    Taking  . metoCLOPramide (REGLAN) 10 MG tablet Take 1 tablet (10 mg total) by mouth every 8 (eight) hours as needed for nausea. (Patient not taking: Reported on 07/11/2016) 15 tablet 0 Not Taking  . Prenatal Vit-Fe Fumarate-FA (PRENATAL MULTIVITAMIN) TABS tablet Take 1 tablet by mouth daily at 12 noon.   Taking  . promethazine (PHENERGAN) 25 MG tablet Take 1 tablet (25 mg total) by mouth every 6 (six) hours as needed for nausea or vomiting. (Patient not taking: Reported on 04/13/2016) 30 tablet 0 Not Taking    ROS: Pertinent findings in history of present illness.  Physical Exam  Blood pressure 114/64, pulse 90, temperature 97.4  F (36.3 C), temperature source Oral, resp. rate 20, last menstrual period 01/18/2016, unknown if currently breastfeeding. CONSTITUTIONAL: Well-developed, well-nourished female in no acute distress.  HENT:  Normocephalic, atraumatic EYES: Conjunctivae and EOM are normal. No scleral icterus.  NECK: Normal range of motion, supple SKIN: Skin is warm and dry. No rash noted. Not diaphoretic. No erythema. No pallor. NEUROLGIC: Alert and oriented to person, place, and time. Normal reflexes, muscle tone coordination. No cranial nerve deficit noted. PSYCHIATRIC: Normal mood and affect. Normal behavior. Normal judgment and thought content. CARDIOVASCULAR: Normal heart rate noted, regular rhythm RESPIRATORY: Effort normal, no problems with respiration noted ABDOMEN: Soft, nontender, nondistended, gravid appropriate for gestational age MUSCULOSKELETAL: Normal range of motion. No edema and no tenderness. 2+ distal pulses.  SPECULUM EXAM: NEFG, adherent whitish-yellow discharge, no blood, cervix clean Dilation: 1 Effacement (%): Thick Cervical Position: Posterior Exam by:: Shawnie PonsPratt MD  FHT:  Baseline 150 , moderate variability, accelerations present, no decelerations Contractions: none   Labs: Results for orders placed or performed during the hospital encounter of 08/02/16 (from the past 24 hour(s))  Urinalysis, Routine w reflex microscopic (not [facility])     Status: Abnormal   Collection Time: 08/02/16  8:58 PM  Result Value Ref Range   Color, Urine YELLOW YELLOW   APPearance CLEAR CLEAR   Specific Gravity, Urine >1.030 (H) 1.005 - 1.030   pH 6.0 5.0 - 8.0   Glucose, UA 500 (A) NEGATIVE mg/dL   Hgb urine dipstick MODERATE (A) NEGATIVE   Bilirubin Urine NEGATIVE NEGATIVE   Ketones, ur NEGATIVE NEGATIVE mg/dL   Protein, ur NEGATIVE NEGATIVE mg/dL   Nitrite NEGATIVE NEGATIVE   Leukocytes, UA SMALL (A) NEGATIVE  Urine microscopic-add on     Status: Abnormal   Collection Time: 08/02/16  8:58 PM   Result Value Ref Range   Squamous Epithelial / LPF 0-5 (A) NONE SEEN   WBC, UA 0-5 0 - 5 WBC/hpf   RBC / HPF 6-30 0 - 5 RBC/hpf   Bacteria, UA FEW (A) NONE SEEN      Assessment: 1. Yeast vaginitis   2. Short cervix in second trimester, antepartum     Plan: Discharge home Terconazole Continue Prometrium Labor precautions and fetal kick counts reviewed Follow up with OB provider  Follow-up Information    [facility]   Specialty:  Obstetrics and Gynecology Contact information: [address] [place] [phone]            Medication List    TAKE these medications   aspirin EC 81 MG tablet Take 1 tablet (81 mg total) by mouth daily.   cephALEXin  500 MG capsule Commonly known as:  KEFLEX Take 2 capsules (1,000 mg total) by mouth 2 (two) times daily.   cyclobenzaprine 10 MG tablet Commonly known as:  FLEXERIL Take 1 tablet (10 mg total) by mouth 2 (two) times daily as needed for muscle spasms.   diphenhydramine-acetaminophen 25-500 MG Tabs tablet Commonly known as:  TYLENOL PM Take 2 tablets by mouth at bedtime as needed (pain).   metoCLOPramide 10 MG tablet Commonly known as:  REGLAN Take 1 tablet (10 mg total) by mouth every 8 (eight) hours as needed for nausea.   prenatal multivitamin Tabs tablet Take 1 tablet by mouth daily at 12 noon.   promethazine 25 MG tablet Commonly known as:  PHENERGAN Take 1 tablet (25 mg total) by mouth every 6 (six) hours as needed for nausea or vomiting.   terconazole 0.8 % vaginal cream Commonly known as:  TERAZOL 3 Place 1 applicator vaginally at bedtime.       Reva Boresanya S Kessie Croston, MD 08/02/2016 10:51 PM

## 2016-08-12 ENCOUNTER — Encounter (HOSPITAL_COMMUNITY): Payer: Self-pay

## 2016-08-12 ENCOUNTER — Ambulatory Visit (HOSPITAL_COMMUNITY)
Admission: RE | Admit: 2016-08-12 | Discharge: 2016-08-12 | Disposition: A | Discharge status: Home / Self Care | Payer: Self-pay | Source: Ambulatory Visit | Attending: Obstetrics and Gynecology | Admitting: Obstetrics and Gynecology

## 2016-08-12 DIAGNOSIS — O09213 Supervision of pregnancy with history of pre-term labor, third trimester: Secondary | ICD-10-CM | POA: Insufficient documentation

## 2016-08-12 DIAGNOSIS — O26879 Cervical shortening, unspecified trimester: Secondary | ICD-10-CM

## 2016-08-12 DIAGNOSIS — O26873 Cervical shortening, third trimester: Secondary | ICD-10-CM | POA: Insufficient documentation

## 2016-08-12 DIAGNOSIS — O09293 Supervision of pregnancy with other poor reproductive or obstetric history, third trimester: Secondary | ICD-10-CM | POA: Insufficient documentation

## 2016-08-12 DIAGNOSIS — Z3A28 28 weeks gestation of pregnancy: Secondary | ICD-10-CM | POA: Insufficient documentation

## 2016-08-12 DIAGNOSIS — O09523 Supervision of elderly multigravida, third trimester: Secondary | ICD-10-CM | POA: Insufficient documentation

## 2016-08-12 DIAGNOSIS — O09892 Supervision of other high risk pregnancies, second trimester: Secondary | ICD-10-CM | POA: Insufficient documentation

## 2016-08-12 NOTE — ED Notes (Signed)
Pt reports small amt of clear, watery fluid with a little blood and odor since yesterday.  Pt denies pain.

## 2016-08-19 ENCOUNTER — Encounter (HOSPITAL_COMMUNITY): Payer: Self-pay

## 2016-08-19 ENCOUNTER — Other Ambulatory Visit (HOSPITAL_COMMUNITY): Payer: Self-pay | Admitting: Maternal and Fetal Medicine

## 2016-08-19 ENCOUNTER — Ambulatory Visit (HOSPITAL_COMMUNITY)
Admission: RE | Admit: 2016-08-19 | Discharge: 2016-08-19 | Disposition: A | Discharge status: Home / Self Care | Payer: Self-pay | Source: Ambulatory Visit | Attending: Obstetrics and Gynecology | Admitting: Obstetrics and Gynecology

## 2016-08-19 DIAGNOSIS — O09523 Supervision of elderly multigravida, third trimester: Secondary | ICD-10-CM | POA: Insufficient documentation

## 2016-08-19 DIAGNOSIS — O26873 Cervical shortening, third trimester: Secondary | ICD-10-CM | POA: Insufficient documentation

## 2016-08-19 DIAGNOSIS — IMO0002 Reserved for concepts with insufficient information to code with codable children: Secondary | ICD-10-CM

## 2016-08-19 DIAGNOSIS — O09213 Supervision of pregnancy with history of pre-term labor, third trimester: Secondary | ICD-10-CM

## 2016-08-19 DIAGNOSIS — Z36 Encounter for antenatal screening of mother: Secondary | ICD-10-CM | POA: Insufficient documentation

## 2016-08-19 DIAGNOSIS — O09293 Supervision of pregnancy with other poor reproductive or obstetric history, third trimester: Secondary | ICD-10-CM

## 2016-08-19 DIAGNOSIS — Z3A29 29 weeks gestation of pregnancy: Secondary | ICD-10-CM | POA: Insufficient documentation

## 2016-08-19 DIAGNOSIS — Z0489 Encounter for examination and observation for other specified reasons: Secondary | ICD-10-CM

## 2016-08-22 ENCOUNTER — Ambulatory Visit (HOSPITAL_COMMUNITY)
Admission: RE | Admit: 2016-08-22 | Discharge: 2016-08-22 | Disposition: A | Discharge status: Home / Self Care | Payer: Self-pay | Source: Ambulatory Visit | Attending: Obstetrics and Gynecology | Admitting: Obstetrics and Gynecology

## 2016-08-22 ENCOUNTER — Other Ambulatory Visit (HOSPITAL_COMMUNITY): Payer: Self-pay | Admitting: *Deleted

## 2016-08-22 ENCOUNTER — Ambulatory Visit (INDEPENDENT_AMBULATORY_CARE_PROVIDER_SITE_OTHER): Payer: Self-pay | Admitting: Obstetrics and Gynecology

## 2016-08-22 ENCOUNTER — Ambulatory Visit (INDEPENDENT_AMBULATORY_CARE_PROVIDER_SITE_OTHER): Payer: Self-pay | Admitting: Clinical

## 2016-08-22 VITALS — BP 97/53 | HR 69 | Wt 141.0 lb

## 2016-08-22 DIAGNOSIS — Z658 Other specified problems related to psychosocial circumstances: Secondary | ICD-10-CM

## 2016-08-22 DIAGNOSIS — O26879 Cervical shortening, unspecified trimester: Secondary | ICD-10-CM | POA: Insufficient documentation

## 2016-08-22 DIAGNOSIS — O368130 Decreased fetal movements, third trimester, not applicable or unspecified: Secondary | ICD-10-CM | POA: Insufficient documentation

## 2016-08-22 DIAGNOSIS — Z3A29 29 weeks gestation of pregnancy: Secondary | ICD-10-CM

## 2016-08-22 DIAGNOSIS — R42 Dizziness and giddiness: Secondary | ICD-10-CM

## 2016-08-22 DIAGNOSIS — O0993 Supervision of high risk pregnancy, unspecified, third trimester: Secondary | ICD-10-CM

## 2016-08-22 DIAGNOSIS — Z23 Encounter for immunization: Secondary | ICD-10-CM

## 2016-08-22 LAB — GLUCOSE, CAPILLARY: GLUCOSE-CAPILLARY: 143 mg/dL — AB (ref 65–99)

## 2016-08-22 LAB — CBC
HEMATOCRIT: 30.1 % — AB (ref 35.0–45.0)
HEMOGLOBIN: 10.2 g/dL — AB (ref 11.7–15.5)
MCH: 31.2 pg (ref 27.0–33.0)
MCHC: 33.9 g/dL (ref 32.0–36.0)
MCV: 92 fL (ref 80.0–100.0)
MPV: 11.4 fL (ref 7.5–12.5)
Platelets: 282 10*3/uL (ref 140–400)
RBC: 3.27 MIL/uL — AB (ref 3.80–5.10)
RDW: 12.9 % (ref 11.0–15.0)
WBC: 6.7 10*3/uL (ref 3.8–10.8)

## 2016-08-22 MED ORDER — TETANUS-DIPHTH-ACELL PERTUSSIS 5-2.5-18.5 LF-MCG/0.5 IM SUSP
0.5000 mL | Freq: Once | INTRAMUSCULAR | Status: AC
  Administered 2016-08-22: 0.5 mL via INTRAMUSCULAR
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
## 2016-08-22 NOTE — Patient Instructions (Signed)
Please go to MAU for further evaluation of your dizziness and decreased fetal movement.

## 2016-08-22 NOTE — Progress Notes (Signed)
Okey RegalCarol used for interpreter for check in  Patient reports not feeling baby move since yesterday afternoon, patient reports feeling very anxious and upset in regards to her mother in [place]

## 2016-08-23 LAB — GLUCOSE TOLERANCE, 1 HOUR (50G) W/O FASTING: Glucose, 1 Hr, gestational: 129 mg/dL (ref <140)

## 2016-08-23 LAB — RPR

## 2016-08-23 LAB — HIV ANTIBODY (ROUTINE TESTING W REFLEX): HIV 1&2 Ab, 4th Generation: NONREACTIVE

## 2016-09-10 ENCOUNTER — Encounter (HOSPITAL_COMMUNITY): Payer: Self-pay | Admitting: Certified Nurse Midwife

## 2016-09-10 ENCOUNTER — Inpatient Hospital Stay (HOSPITAL_COMMUNITY)
Admission: AD | Admit: 2016-09-10 | Discharge: 2016-09-10 | Disposition: A | Discharge status: Home / Self Care | Payer: Self-pay | Source: Ambulatory Visit | Attending: Obstetrics & Gynecology | Admitting: Obstetrics & Gynecology

## 2016-09-10 DIAGNOSIS — Z3A32 32 weeks gestation of pregnancy: Secondary | ICD-10-CM | POA: Insufficient documentation

## 2016-09-10 DIAGNOSIS — Z7982 Long term (current) use of aspirin: Secondary | ICD-10-CM | POA: Insufficient documentation

## 2016-09-10 DIAGNOSIS — O26873 Cervical shortening, third trimester: Secondary | ICD-10-CM

## 2016-09-10 DIAGNOSIS — Z87891 Personal history of nicotine dependence: Secondary | ICD-10-CM | POA: Insufficient documentation

## 2016-09-10 DIAGNOSIS — O4703 False labor before 37 completed weeks of gestation, third trimester: Secondary | ICD-10-CM

## 2016-09-10 LAB — URINALYSIS, ROUTINE W REFLEX MICROSCOPIC
Bilirubin Urine: NEGATIVE
Glucose, UA: NEGATIVE mg/dL
Ketones, ur: NEGATIVE mg/dL
Nitrite: NEGATIVE
PH: 6.5 (ref 5.0–8.0)
Protein, ur: NEGATIVE mg/dL
SPECIFIC GRAVITY, URINE: 1.025 (ref 1.005–1.030)

## 2016-09-10 LAB — URINE MICROSCOPIC-ADD ON

## 2016-09-10 LAB — FETAL FIBRONECTIN: FETAL FIBRONECTIN: POSITIVE — AB

## 2016-09-10 MED ORDER — ACETAMINOPHEN 325 MG PO TABS
650.0000 mg | ORAL_TABLET | Freq: Once | ORAL | Status: AC
  Administered 2016-09-10: 650 mg via ORAL
  Filled 2016-09-10: qty 2

## 2016-09-10 MED ORDER — BETAMETHASONE SOD PHOS & ACET 6 (3-3) MG/ML IJ SUSP
12.0000 mg | Freq: Once | INTRAMUSCULAR | Status: AC
  Administered 2016-09-10: 12 mg via INTRAMUSCULAR
  Filled 2016-09-10: qty 2

## 2016-09-10 NOTE — MAU Provider Note (Signed)
Chief Complaint:  Contractions   First Provider Initiated Contact with Patient 09/10/16 0945     HPI: Cathy Nash is a 35 y.o. [id number] at 50w1dwho presents to maternity admissions reporting contractions all night and morning long.  States are stronger now. . She reports good fetal movement, denies LOF, vaginal bleeding, vaginal itching/burning, urinary symptoms, h/a, dizziness, n/v, diarrhea, constipation or fever/chills.  She denies headache, visual changes or RUQ abdominal pain.  Abdominal Pain  This is a new problem. The current episode started yesterday. The onset quality is gradual. The problem occurs intermittently. The problem has been waxing and waning. The pain is located in the suprapubic region, LLQ and RLQ. The pain is moderate. The quality of the pain is cramping. The abdominal pain does not radiate. Pertinent negatives include no constipation, diarrhea, dysuria, flatus, frequency, headaches, myalgias, nausea or vomiting. Nothing aggravates the pain. The pain is relieved by nothing. She has tried nothing for the symptoms.   RN Note: Pt states she had ctxs all night and this AM that was increasingly painful. Pt denies LOF or vaginal bleeding. Fetus is active.   Past Medical History: Past Medical History:  Diagnosis Date  . Anxiety   . Asthma   . Asthma   . Pregnancy induced hypertension    previous pregnancy  . Preterm labor    1st pregnancy d/t pre-e  . Pyelonephritis    June 2013  . Vaginal pessary in situ   . Varicose veins     Past obstetric history: OB History  Gravida Para Term Preterm AB Living  5 4 2 2  0 4  SAB TAB Ectopic Multiple Live Births  0 0 0 0 4    # Outcome Date GA Lbr Len/2nd Weight Sex Delivery Anes PTL Lv  5 Current           4 Preterm 10/15/15 [redacted]w[redacted]d 03:17 / 00:09 4 lb 14.3 oz (2.22 kg) M Vag-Spont EPI  LIV  3 Term 02/21/14 [redacted]w[redacted]d 06:35 / 01:00 5 lb 10.8 oz (2.575 kg) M Vag-Spont Other  LIV  2 Term 08/17/12 [redacted]w[redacted]d 05:51 / 00:06 5 lb 8  oz (2.495 kg) M Vag-Spont None  LIV  1 Preterm 07/04/06 [redacted]w[redacted]d  5 lb (2.268 kg) M Vag-Spont  N LIV      Past Surgical History: Past Surgical History:  Procedure Laterality Date  . NO PAST SURGERIES      Family History: Family History  Problem Relation Age of Onset  . Diabetes Mother   . Asthma Father   . Anesthesia problems Neg Hx   . Hypotension Neg Hx   . Malignant hyperthermia Neg Hx   . Pseudochol deficiency Neg Hx   . Other Neg Hx     Social History: Social History  Substance Use Topics  . Smoking status: Former Games developer  . Smokeless tobacco: Never Used     Comment: yrs ago  . Alcohol use No    Allergies:  Allergies  Allergen Reactions  . Iohexol Hives and Shortness Of Breath     Code: SOB, Desc: IMMEDIATELY SOB FOLLOWING IV INJECTION, SPOTTY HIVES, PT GIVEN BENADRYL AND EPI-ARS 08/22/09, Onset Date: [id number]   . Latex Itching and Rash    Meds:  Prescriptions Prior to Admission  Medication Sig Dispense Refill Last Dose  . Prenatal Vit-Fe Fumarate-FA (PRENATAL MULTIVITAMIN) TABS tablet Take 1 tablet by mouth daily at 12 noon.   Past Week at Unknown time  . aspirin EC 81 MG tablet Take 1  tablet (81 mg total) by mouth daily. (Patient not taking: Reported on 09/10/2016) 60 tablet 3 Not Taking at Unknown time    I have reviewed patient's Past Medical Hx, Surgical Hx, Family Hx, Social Hx, medications and allergies.   ROS:  Review of Systems  Gastrointestinal: Positive for abdominal pain. Negative for constipation, diarrhea, flatus, nausea and vomiting.  Genitourinary: Negative for dysuria and frequency.  Musculoskeletal: Negative for myalgias.  Neurological: Negative for headaches.   Other systems negative  Physical Exam  Patient Vitals for the past 24 hrs:  BP Temp Temp src Pulse Resp  09/10/16 0926 112/58 97.7 F (36.5 C) Oral 87 20   Constitutional: Well-developed, well-nourished female in no acute distress.  Cardiovascular: normal rate and  rhythm Respiratory: normal effort, clear to auscultation bilaterally GI: Abd soft, non-tender, gravid appropriate for gestational age.   No rebound or guarding. MS: Extremities nontender, no edema, normal ROM Neurologic: Alert and oriented x 4.  GU: Neg CVAT.  PELVIC EXAM: Cervix pink, visually closed, without lesion, scant white creamy discharge, vaginal walls and external genitalia normal Bimanual exam: Cervix firm, posterior, neg CMT, uterus nontender, Fundal Height consistent with dates, adnexa without tenderness, enlargement, or mass  Cervix FT/70%/-3/vertex  FHT:  Baseline 140 , moderate variability, accelerations present, no decelerations Contractions:   Rare   Labs: No results found for this or any previous visit (from the past 24 hour(s)). O/POS/-- (07/31 [id number])  Imaging:  [place] Mfm Ob Transvaginal  Result Date: 08/12/2016 OBSTETRICAL ULTRASOUND: This exam was performed within a [facility] Ultras[facility] Department. The OB US report was generated in the AS system, and faxed to the ordering physician.  This report is available in the [employer] PACS. See the AS Obstetric US report via the Image Link.  [place] Mfm Fetal Bpp Wo Non Stress  Result Date: 08/23/2016 OBSTETRICAL ULTRASOUND: This exam was performed within a [facility] Ultrasound Department. The OB US report was generated in the AS system, and faxed to the ordering physician.  This report is available in the [employer] PACS. See the AS Obstetric US report via the Image Link.  [place] Mfm Ob Follow Up  Result Date: 08/19/2016 OBSTETRICAL ULTRASOUND: This exam was performed within a [facility] Ultrasound Department. The OB US report was generated in the AS system, and faxed to the ordering physician.  This report is available in the [employer] PACS. See the AS Obstetric US report via the Image Link.   MAU Course/MDM: I have ordered labs and reviewed results. Fetal fibronectin collected prior to exam.  This came back positive, even though she is  not having contractions now. Cervix is shortened.  Has not used Prometrium in 2 days. NST reviewed Consult Dr Penne LashLeggett with presentation, exam findings and test results.  Treatments in MAU included Betamethasone.  Will do second dose tomorrow..    Assessment: SIUP at 9147w1d Preterm contractions at home, none here Cervical effacement in preterm period Positive Fetal Fibronectin  Plan: Discharge home Return tomorrow for Second Betamethasone Preterm Labor precautions and fetal kick counts Follow up in Office for prenatal visits and recheck of cervix  Encouraged to return here or to other Urgent Care/ED if she develops worsening of symptoms, increase in pain, fever, or other concerning symptoms.   Pt stable at time of discharge.  Wynelle BourgeoisMarie Jenson Beedle CNM, MSN Certified Nurse-Midwife 09/10/2016 10:07 AM

## 2016-09-10 NOTE — MAU Note (Signed)
Pt states she had ctxs all night and this AM that was increasingly painful. Pt denies LOF or vaginal bleeding. Fetus is active.

## 2016-09-10 NOTE — Discharge Instructions (Signed)
Reposo plvico  (Pelvic Rest) El reposo plvico se recomienda a las mujeres cuando:   La placenta cubre parcial o completamente la abertura del cuello del tero (placenta previa).  Hay sangrado entre la pared del tero y el saco amnitico en el primer trimestre (hemorragia subcorinica).  El cuello uterino comienza a abrirse sin iniciarse el trabajo de parto (cuello uterino incompetente, insuficiencia cervical).  El [place] Hu[place]ajo de parto se inicia muy pronto (parto prematuro). INSTRUCCIONES PARA EL CUIDADO EN EL HOGAR   No tenga relaciones sexuales, estimulacin, ni orgasmos.  No use tampones, no se haga duchas vaginales ni coloque ningn objeto en la vagina.  No levante objetos que pesen ms de 10 libras (4,5 kg).  Evite las actividades extenuantes o tensionar los msculos de la pelvis. SOLICITE ATENCIN MDICA SI:   Tiene un sangrado vaginal durante el embarazo. Considrelo como una posible emergencia.  Siente clicos en la zona baja del estmago (ms fuertes que los clicos menstruales).  Nota flujo vaginal (acuoso, con moco o [place]re).  Siente un dolor en la espalda leve y sordo.  Tiene contracciones regulares o endurecimiento del tero. SOLICITE ATENCIN MDICA DE INMEDIATO SI:  Observa sangrado vaginal y tiene placenta previa.    Esta informacin no tiene Theme park managercomo fin reemplazar el consejo del mdico. Asegrese de hacerle al mdico cualquier pregunta que tenga.   Document Released: 08/22/2012 Elsevier Interactive Patient Education 2016 [employer].  Informacin sobre el parto prematuro  (Preterm Labor Information) El parto prematuro comienza antes de la semana 37 de Kenaiembarazo. La duracin de un embarazo normal es de 39 a 41 semanas.  CAUSAS  Generalmente no hay una causa que pueda identificarse del motivo por el que una mujer comienza un trabajo de parto prematuro. Sin embargo, una de las causas conocidas ms frecuentes son las infecciones. Las infecciones del tero, el  cuello, la vagina, el lquido [place]nitico, la vejiga, los riones y Teacher, adult educationhasta de los pulmones (neumona) pueden hacer que el trabajo de parto se inicie. Otras causas que pueden sospecharse son:   Infecciones urogenitales, como infecciones por hongos y vaginosis bacteriana.   Anormalidades uterinas (forma del tero, sptum uterino, fibromas, hemorragias en la placenta).   Un cuello que ha sido operado (puede ser que no permanezca cerrado).   Malformaciones del feto.   Gestaciones mltiples (mellizos, trillizos y ms).   Ruptura del saco amnitico.  FACTORES DE RIESGO   Historia previa de parto prematuro.   Tener ruptura prematura de las membranas (RPM).   La placenta cubre la abertura del cuello (placenta previa).   La placenta se separa del tero (abrupcin placentaria).   El cuello es demasiado dbil para contener al beb en el tero (cuello incompetente).   Hay mucho lquido en el saco amnitico (polihidramnios).   Consumo de drogas o hbito de fumar durante Firefighterel embarazo.   No aumentar de peso lo suficiente durante el [place]arazo.   Mujeres menores de 18 aos o mayores de [address] [place] aos.   Nivel socioeconmico bajo.   Pertenecer a Engineer, productionla raza afroamericana. SNTOMAS  Los signos y sntomas del trabajo de parto prematuro son:   Public librarianClicos similares a los Designer, jewellerymenstruales, dolor abdominal o dolor de espalda.  Contracciones uterinas regulares, tan frecuentes como seis por hora, sin importar su intensidad (pueden ser suaves o dolorosas).  Contracciones que comienzan en la parte superior del tero y se expanden hacia abajo, a la zona inferior del abdomen y la espalda.   Sensacin de aumento de presin en la pelvis.  Aparece una secrecin acuosa o sanguinolenta por la vagina.  TRATAMIENTO  Segn el tiempo del embarazo y otras [place], el mdico puede indicar reposo en cama. Si es necesario, le indicarn medicamentos para TEFL teacher las contracciones y para Customer service manager los pulmones  del feto. Si el trabajo de parto se inicia antes de las 34 semanas de [place], se recomienda la hospitalizacin. El tratamiento depende de las condiciones en que se encuentren usted y el feto.  QU DEBE HACER SI PIENSA QUE EST EN TRABAJO DE PARTO PREMATURO?  Comunquese con su mdico inmediatamente. Debe concurrir al hospital para ser controlada inmediatamente.  CMO PUEDE EVITAR EL TRABAJO DE PARTO PREMATURO EN FUTUROS EMBARAZOS?  Usted debe:   Si fuma, abandonar el hbito.  Mantener un peso saludable y evitar sustancias qumicas y drogas innecesarias.  Controlar todo tipo de infeccin.  Informe a su mdico si tiene una historia conocida de trabajo de parto prematuro.   Esta informacin no tiene Theme park manager el consejo del mdico. Asegrese de hacerle al mdico cualquier pregunta que tenga.   Document Released: 03/06/2008 Document Revised: 07/31/2013 Elsevier Interactive Patient Education 2016 [employer].  Fibronectina fetal (Fetal Fibronectin) La fibronectina fetal (FnF) es una protena que el cuerpo produce durante el [place]. Esta protena normalmente se encuentra en la secrecin vaginal a comienzos del Psychiatrist y antes del parto. No debe estar presente entre la semana22 y 35de embarazo. La presencia de FnF en la vagina entre la semana22 y 35de embarazo podra ser una advertencia de que el beb nacer antes de tiempo (prematuro). Los bebs prematuros, o que nacen antes de la semana37, pueden tener problemas para respirar o alimentarse. Un estudio de FnF con resultado negativo entre la semana22 y 35de embarazo significa que no es probable que tenga un parto prematuro en las 2semanas siguientes. Pueden hacerle este estudio si tiene sntomas de trabajo de [place]. Estos incluyen los siguientes:  Contracciones.  Aumento de la secrecin vaginal.  Dolor de espalda. Si hay alguna probabilidad de que tenga un Aleen Campi de parto y un parto prematuro, el mdico la  monitorear de cerca y, si es necesario, tomar medidas para que el nacimiento no se adelante.  Este estudio requiere una muestra de la secrecin de la parte interna de la vagina. El mdico obtendr esta muestra con un hisopo de algodn.  PREPARACIN PARA EL ESTUDIO   Pregntele al mdico si:  Debe evitar usar lubricantes o hacerse un lavado vaginal antes del examen.  Debe evitar tener relaciones sexuales 24horas antes del examen.  Dgale al mdico si tiene una infeccin llamada candidosis vaginal o algn sntoma de dicha infeccin:  Picazn.  Dolor.  Secrecin. RESULTADOS Es su responsabilidad retirar el resultado del [place]. Consulte en el laboratorio o en el departamento en el que fue realizado el estudio cundo y cmo podr [employer]. Comunquese con el mdico si tiene [employer].  Los resultados de este estudio sern positivos o Audiological scientist.  Significado de los [employer] del estudio El resultado negativo indica que no se detect FnF en la secrecin vaginal y, por lo [place], hay muy pocas probabilidades de que entre en [place] de parto en las prximas [place]. Si todava tiene sntomas de [place] de [address], pueden hacerle este estudio de [employer].  Significado de [employer] positivos del estudio El resultado positivo indica que se detect FnF en la secrecin vaginal. El resultado positivo no significa que entrar en trabajo de [place],  sino que su riesgo es ms alto. El mdico puede hacerle otros estudios y exmenes para supervisar de cerca su Psychiatrist.   Esta informacin no tiene Theme park manager el consejo del mdico. Asegrese de hacerle al mdico cualquier pregunta que tenga.   Document Released: 09/06/2008 Document Revised: 12/19/2014 Elsevier Interactive Patient Education [employer] Inc.

## 2016-09-11 ENCOUNTER — Inpatient Hospital Stay (HOSPITAL_COMMUNITY)
Admission: AD | Admit: 2016-09-11 | Discharge: 2016-09-11 | Disposition: A | Discharge status: Home / Self Care | Payer: Self-pay | Source: Ambulatory Visit | Attending: Obstetrics & Gynecology | Admitting: Obstetrics & Gynecology

## 2016-09-11 DIAGNOSIS — Z3493 Encounter for supervision of normal pregnancy, unspecified, third trimester: Secondary | ICD-10-CM | POA: Insufficient documentation

## 2016-09-11 DIAGNOSIS — Z3A33 33 weeks gestation of pregnancy: Secondary | ICD-10-CM | POA: Insufficient documentation

## 2016-09-11 MED ORDER — BETAMETHASONE SOD PHOS & ACET 6 (3-3) MG/ML IJ SUSP
12.0000 mg | Freq: Once | INTRAMUSCULAR | Status: AC
  Administered 2016-09-11: 12 mg via INTRAMUSCULAR
  Filled 2016-09-11: qty 2

## 2016-09-12 ENCOUNTER — Encounter: Payer: Self-pay | Admitting: Obstetrics & Gynecology

## 2016-09-16 ENCOUNTER — Ambulatory Visit (HOSPITAL_COMMUNITY)
Admission: RE | Admit: 2016-09-16 | Discharge: 2016-09-16 | Disposition: A | Discharge status: Home / Self Care | Payer: Self-pay | Source: Ambulatory Visit | Attending: Obstetrics and Gynecology | Admitting: Obstetrics and Gynecology

## 2016-09-16 ENCOUNTER — Encounter (HOSPITAL_COMMUNITY): Payer: Self-pay

## 2016-09-16 ENCOUNTER — Encounter (HOSPITAL_COMMUNITY): Payer: Self-pay | Admitting: Obstetrics & Gynecology

## 2016-09-16 ENCOUNTER — Inpatient Hospital Stay (HOSPITAL_COMMUNITY)
Admission: AD | Admit: 2016-09-16 | Discharge: 2016-09-16 | Disposition: A | Discharge status: Home / Self Care | Payer: Self-pay | Source: Ambulatory Visit | Attending: Obstetrics & Gynecology | Admitting: Obstetrics & Gynecology

## 2016-09-16 DIAGNOSIS — Z79899 Other long term (current) drug therapy: Secondary | ICD-10-CM | POA: Insufficient documentation

## 2016-09-16 DIAGNOSIS — Z87891 Personal history of nicotine dependence: Secondary | ICD-10-CM | POA: Insufficient documentation

## 2016-09-16 DIAGNOSIS — Z888 Allergy status to other drugs, medicaments and biological substances status: Secondary | ICD-10-CM | POA: Insufficient documentation

## 2016-09-16 DIAGNOSIS — O99343 Other mental disorders complicating pregnancy, third trimester: Secondary | ICD-10-CM | POA: Insufficient documentation

## 2016-09-16 DIAGNOSIS — O99513 Diseases of the respiratory system complicating pregnancy, third trimester: Secondary | ICD-10-CM | POA: Insufficient documentation

## 2016-09-16 DIAGNOSIS — O322XX1 Maternal care for transverse and oblique lie, fetus 1: Secondary | ICD-10-CM

## 2016-09-16 DIAGNOSIS — Z3A33 33 weeks gestation of pregnancy: Secondary | ICD-10-CM | POA: Insufficient documentation

## 2016-09-16 LAB — CBC WITH DIFFERENTIAL/PLATELET
Basophils Absolute: 0 10*3/uL (ref 0.0–0.1)
Basophils Relative: 0 %
EOS PCT: 2 %
Eosinophils Absolute: 0.1 10*3/uL (ref 0.0–0.7)
HEMATOCRIT: 29.4 % — AB (ref 36.0–46.0)
Hemoglobin: 10.2 g/dL — ABNORMAL LOW (ref 12.0–15.0)
LYMPHS ABS: 1.8 10*3/uL (ref 0.7–4.0)
LYMPHS PCT: 22 %
MCH: 30.8 pg (ref 26.0–34.0)
MCHC: 34.7 g/dL (ref 30.0–36.0)
MCV: 88.8 fL (ref 78.0–100.0)
MONO ABS: 0.3 10*3/uL (ref 0.1–1.0)
MONOS PCT: 3 %
Neutro Abs: 6.2 10*3/uL (ref 1.7–7.7)
Neutrophils Relative %: 73 %
PLATELETS: 298 10*3/uL (ref 150–400)
RBC: 3.31 MIL/uL — ABNORMAL LOW (ref 3.87–5.11)
RDW: 13.1 % (ref 11.5–15.5)
WBC: 8.4 10*3/uL (ref 4.0–10.5)

## 2016-09-16 LAB — URINALYSIS, ROUTINE W REFLEX MICROSCOPIC
Bilirubin Urine: NEGATIVE
GLUCOSE, UA: NEGATIVE mg/dL
Ketones, ur: NEGATIVE mg/dL
Nitrite: NEGATIVE
PH: 5.5 (ref 5.0–8.0)
Protein, ur: NEGATIVE mg/dL
SPECIFIC GRAVITY, URINE: 1.02 (ref 1.005–1.030)

## 2016-09-16 LAB — URINE MICROSCOPIC-ADD ON: BACTERIA UA: NONE SEEN

## 2016-09-16 MED ORDER — MAGNESIUM SULFATE BOLUS VIA INFUSION
4.0000 g | Freq: Once | INTRAVENOUS | Status: AC
  Administered 2016-09-16: 4 g via INTRAVENOUS
  Filled 2016-09-16: qty 500

## 2016-09-16 MED ORDER — TERBUTALINE SULFATE 1 MG/ML IJ SOLN
0.2500 mg | INTRAMUSCULAR | Status: AC
  Administered 2016-09-16: 0.25 mg via SUBCUTANEOUS
  Filled 2016-09-16: qty 1

## 2016-09-16 MED ORDER — MAGNESIUM SULFATE 50 % IJ SOLN
2.0000 g/h | INTRAVENOUS | Status: DC
  Administered 2016-09-16: 2 g/h via INTRAVENOUS
  Filled 2016-09-16: qty 80

## 2016-09-16 NOTE — MAU Note (Signed)
[facility] arrived to transport [facility] to [facility].

## 2016-09-16 NOTE — MAU Note (Signed)
Magnesium bolus started.
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
## 2016-09-16 NOTE — MAU Note (Signed)
Pt could not void.

## 2016-09-16 NOTE — MAU Note (Signed)
2 attempts at IVs.

## 2016-09-27 ENCOUNTER — Inpatient Hospital Stay (HOSPITAL_COMMUNITY)
Admission: AD | Admit: 2016-09-27 | Discharge: 2016-09-28 | Disposition: A | Discharge status: Home / Self Care | Payer: Self-pay | Source: Ambulatory Visit | Attending: Obstetrics & Gynecology | Admitting: Obstetrics & Gynecology

## 2016-09-27 ENCOUNTER — Encounter (HOSPITAL_COMMUNITY): Payer: Self-pay

## 2016-09-27 DIAGNOSIS — O4700 False labor before 37 completed weeks of gestation, unspecified trimester: Secondary | ICD-10-CM

## 2016-09-27 DIAGNOSIS — O9989 Other specified diseases and conditions complicating pregnancy, childbirth and the puerperium: Secondary | ICD-10-CM

## 2016-09-27 DIAGNOSIS — Z3A34 34 weeks gestation of pregnancy: Secondary | ICD-10-CM | POA: Insufficient documentation

## 2016-09-27 DIAGNOSIS — Z87891 Personal history of nicotine dependence: Secondary | ICD-10-CM | POA: Insufficient documentation

## 2016-09-27 DIAGNOSIS — O26893 Other specified pregnancy related conditions, third trimester: Secondary | ICD-10-CM

## 2016-09-27 DIAGNOSIS — O479 False labor, unspecified: Secondary | ICD-10-CM

## 2016-09-27 DIAGNOSIS — M549 Dorsalgia, unspecified: Secondary | ICD-10-CM | POA: Insufficient documentation

## 2016-09-27 DIAGNOSIS — O99891 Other specified diseases and conditions complicating pregnancy: Secondary | ICD-10-CM

## 2016-09-27 LAB — URINALYSIS, ROUTINE W REFLEX MICROSCOPIC
Bilirubin Urine: NEGATIVE
GLUCOSE, UA: NEGATIVE mg/dL
Ketones, ur: NEGATIVE mg/dL
LEUKOCYTES UA: NEGATIVE
Nitrite: NEGATIVE
PROTEIN: NEGATIVE mg/dL
SPECIFIC GRAVITY, URINE: 1.01 (ref 1.005–1.030)
pH: 6 (ref 5.0–8.0)

## 2016-09-27 LAB — URINE MICROSCOPIC-ADD ON: WBC, UA: NONE SEEN WBC/hpf (ref 0–5)

## 2016-09-27 MED ORDER — CYCLOBENZAPRINE HCL 5 MG PO TABS
5.0000 mg | ORAL_TABLET | Freq: Once | ORAL | Status: AC
  Administered 2016-09-27: 5 mg via ORAL
  Filled 2016-09-27: qty 1

## 2016-09-27 MED ORDER — ACETAMINOPHEN 325 MG PO TABS
650.0000 mg | ORAL_TABLET | Freq: Four times a day (QID) | ORAL | Status: DC | PRN
  Administered 2016-09-27: 650 mg via ORAL
  Filled 2016-09-27: qty 2

## 2016-09-27 MED ORDER — NIFEDIPINE 10 MG PO CAPS
10.0000 mg | ORAL_CAPSULE | Freq: Once | ORAL | Status: AC
  Administered 2016-09-27: 10 mg via ORAL
  Filled 2016-09-27: qty 1

## 2016-09-27 NOTE — MAU Note (Signed)
Patient presents with c/o ctx [id number]. Patient also states she has a head ache and back pain. Patient also states that she is having vaginal spotting since yesterday.

## 2016-09-27 NOTE — MAU Provider Note (Signed)
History     CSN: [id number]  Arrival date and time: 09/27/16 2130     Chief Complaint  Patient presents with  . Contractions   HPI   35 y/o G5P4 @ 34 and 4 weeks who presents with concerns for preterm labor. She was seen two wks ago and transferred to [facility] for pre-term labor. There she was evaluated and found to not have a changing cervix. She has gotten a course of betamethasone. She is also complaining of headache that resolved with tylenol yesterday. She has headache today but has not taken tylenol. She does have low back pain that she thinks is muscular.  She cannot identify specific times when she is having contractions.   She denies vaginal bleeding and reports regular fetal movement.  OB History    Gravida Para Term Preterm AB Living   5 4 2 2  0 4   SAB TAB Ectopic Multiple Live Births   0 0 0 0 4      Past Medical History:  Diagnosis Date  . Anxiety   . Asthma   . Pregnancy induced hypertension    previous pregnancy  . Preterm labor    1st pregnancy d/t pre-e  . Pyelonephritis    June 2013  . Vaginal pessary in situ   . Varicose veins     Past Surgical History:  Procedure Laterality Date  . NO PAST SURGERIES      Family History  Problem Relation Age of Onset  . Diabetes Mother   . Asthma Father   . Anesthesia problems Neg Hx   . Hypotension Neg Hx   . Malignant hyperthermia Neg Hx   . Pseudochol deficiency Neg Hx   . Other Neg Hx     Social History  Substance Use Topics  . Smoking status: Former Games developer  . Smokeless tobacco: Never Used     Comment: yrs ago  . Alcohol use No    Allergies:  Allergies  Allergen Reactions  . Iohexol Hives and Shortness Of Breath     Code: SOB, Desc: IMMEDIATELY SOB FOLLOWING IV INJECTION, SPOTTY HIVES, PT GIVEN BENADRYL AND EPI-ARS 08/22/09, Onset Date: [id number]   . Latex Itching and Rash    Prescriptions Prior to Admission  Medication Sig Dispense Refill Last Dose  . acetaminophen (TYLENOL) 325 MG  tablet Take 650 mg by mouth every 6 (six) hours as needed for mild pain, moderate pain or headache.   09/15/2016 at 2300  . Prenatal Vit-Fe Fumarate-FA (PRENATAL MULTIVITAMIN) TABS tablet Take 1 tablet by mouth daily.    09/15/2016 at Unknown time    Review of Systems  Constitutional: Negative for chills and fever.  HENT: Negative for congestion and hearing loss.   Eyes: Negative for blurred vision and double vision.  Respiratory: Negative for cough and sputum production.   Cardiovascular: Negative for chest pain and palpitations.  Gastrointestinal: Negative for abdominal pain, heartburn, nausea and vomiting.  Genitourinary: Negative for dysuria and urgency.  Musculoskeletal: Positive for back pain. Negative for myalgias.  Skin: Negative for itching and rash.  Neurological: Positive for headaches. Negative for dizziness, tingling and tremors.  Endo/Heme/Allergies: Negative for environmental allergies. Does not bruise/bleed easily.   Physical Exam   Blood pressure 109/61, pulse 85, temperature 97.6 F (36.4 C), temperature source Oral, resp. rate 16, last menstrual period 01/18/2016, unknown if currently breastfeeding.  Physical Exam  Constitutional: She is oriented to person, place, and time. She appears well-developed and well-nourished.  Cardiovascular: Normal rate and  intact distal pulses.   Respiratory: Effort normal. No respiratory distress.  Genitourinary:  Genitourinary Comments: Cervix 3/50/ballotable  Neurological: She is alert and oriented to person, place, and time.  Skin: Skin is warm and dry.  Psychiatric: She has a normal mood and affect. Her behavior is normal.   Bed side ultrasound showed beech presentation  MAU Course  Procedures  MDM  In the MAU pt underwent evaluation. She had reactive NST and mild uterine irritability tocometer. Irritability improved with procardia. She reported no contractions during her stay. She was given 10mg  of nifedipine and 650mg  of  tylenol. Her headache improved and she reported no more contractions. Her back pain was persistent. She was given a flexeril and back pain improved. Cervix was rechecked and unchanged. Pt was discharged home.  Assessment and Plan  #1. Preterm Contractions: did not note significant contractions in MAU. No cervical change. Pt can be D/C home at this time. #2. Back pain in pregnacy: given flexeril, improved sent home with limited supply advised to use heating pad and warm baths to help improve pain.  Ernestina Pennaicholas Graciana Sessa 09/27/2016, 10:08 PM

## 2016-09-27 NOTE — Discharge Instructions (Signed)
Informacin sobre Government social research officer  (Preterm Labor Information) El parto prematuro comienza antes de la semana 37 de [place]. La duracin de un embarazo normal es de 39 a 41 semanas.  CAUSAS  Generalmente no hay una causa que pueda identificarse del motivo por el que una mujer comienza un trabajo de parto prematuro. Sin embargo, una de las causas conocidas ms frecuentes son las infecciones. Las infecciones del tero, el cuello, la vagina, el lquido [place], la vejiga, los riones y Teacher, adult education de los pulmones (neumona) pueden hacer que el trabajo de parto se inicie. Otras causas que pueden sospecharse son:   Infecciones urogenitales, como infecciones por hongos y vaginosis bacteriana.   Anormalidades uterinas (forma del tero, sptum uterino, fibromas, hemorragias en la placenta).   Un cuello que ha sido operado (puede ser que no permanezca cerrado).   Malformaciones del feto.   Gestaciones mltiples (mellizos, trillizos y ms).   Ruptura del saco amnitico.  FACTORES DE RIESGO   Historia previa de parto prematuro.   Tener ruptura prematura de las membranas (RPM).   La placenta cubre la abertura del cuello (placenta previa).   La placenta se separa del tero (abrupcin placentaria).   El cuello es demasiado dbil para contener al beb en el tero (cuello incompetente).   Hay mucho lquido en el saco amnitico (polihidramnios).   Consumo de drogas o hbito de fumar durante Firefighter.   No aumentar de peso lo suficiente durante el [employer].   Mujeres menores de 18 aos o mayores de [address] [place].   Nivel socioeconmico bajo.   Pertenecer a Engineer, production. SNTOMAS  Los signos y sntomas del trabajo de parto prematuro son:   Public librarian similares a los Designer, jewellery, dolor abdominal o dolor de espalda.  Contracciones uterinas regulares, tan frecuentes como seis por hora, sin importar su intensidad (pueden ser suaves o dolorosas).  Contracciones que comienzan  en la parte superior del tero y se expanden hacia abajo, a la zona inferior del abdomen y la espalda.   Sensacin de aumento de presin en la pelvis.   Aparece una secrecin acuosa o sanguinolenta por la vagina.  TRATAMIENTO  Segn el tiempo del embarazo y otras [place], el mdico puede indicar reposo en cama. Si es necesario, le indicarn medicamentos para TEFL teacher las contracciones y para Customer service manager los pulmones del feto. Si el trabajo de parto se inicia antes de las 34 semanas de [place], se recomienda la hospitalizacin. El tratamiento depende de las condiciones en que se encuentren usted y el feto.  QU DEBE HACER SI PIENSA QUE EST EN TRABAJO DE PARTO PREMATURO?  Comunquese con su mdico inmediatamente. Debe concurrir al hospital para ser controlada inmediatamente.  CMO PUEDE EVITAR EL TRABAJO DE PARTO PREMATURO EN FUTUROS EMBARAZOS?  Usted debe:   Si fuma, abandonar el hbito.  Mantener un peso saludable y evitar sustancias qumicas y drogas innecesarias.  Controlar todo tipo de infeccin.  Informe a su mdico si tiene una historia conocida de trabajo de parto prematuro.   Esta informacin no tiene Theme park manager el consejo del mdico. Asegrese de hacerle al mdico cualquier pregunta que tenga.   Document Released: 03/06/2008 Document Revised: 07/31/2013 Elsevier Interactive Patient Education 2016 [employer].   Dolor de espalda durante el embarazo  (Back Pain in Pregnancy)  El dolor de espalda es habitual durante el [place]. Ocurre en aproximadamente la mitad de todos los [place]. Es importante para usted y su beb que permanezca activa durante el [place].Si siente que el  dolor de espalda es lo que no le permite mantenerse activa o dormir bien, Scientist, clinical (histocompatibility and immunogenetics) a su mdico. La causa del dolor de espalda puede deberse a varios factores relacionados con los cambios durante el [place].Afortunadamente, excepto que haya tenido problemas de espalda antes del  [place], es probable que el dolor mejore despus del [place]. El dolor lumbar por lo general ocurre entre el quinto y sptimo mes del Psychiatrist. Sin embargo, puede ocurrir [employer] primeros meses. Otros factores que aumentan el riesgo son:   Problemas previos en la espalda.  Lesiones en la espalda.  Tener gemelos o embarazos mltiples.  Tos persistente.  El estrs.  Movimientos repetitivos relacionados con Kathie Dike.  Enfermedad muscular o de la columna vertebral en la espalda.  Antecedentes familiares de problemas de espalda, rotura (hernia) de discos u osteoporosis.  Depresin, ansiedad y crisis de [place]. CAUSAS   En las embarazadas, el cuerpo produce una hormona llamada relaxina. Esta hormona hace que los ligamentos que conectan la zona lumbar y los huesos del pubis sean ms flexibles. Esta flexibilidad permite que el beb nazca con ms facilidad. Cuando los ligamentos estn relajados, los msculos tienen que trabajar ms para apoyar la espalda. El dolor en la espalda puede deberse al cansancio muscular. El dolor tambin puede tener su causa en la irritacin de los tejidos de a espalda que se irritan ya que estn recibiendo menos apoyo.  A medida que el beb crece, ejerce presin [employer] nervios y los vasos sanguneos de la pelvis. Esto causa dolor de espalda.  A medida que el beb crece y [address] durante el [place], el tero presiona los msculos del estmago hacia adelante y [place] su centro de gravedad. Esto hace que los msculos de la espalda deban trabajar ms para mantener una buena Homer C Jones. SNTOMAS  Dolor lumbar durante el embarazo Generalmente se produce en la zona o por arriba de la cintura en el centro de la espalda. Puede haber dolor y entumecimiento que se irradia hacia la pierna o el pie. Es similar al dolor de espalda baja experimentada por las mujeres no embarazadas. Por lo general, aumenta al [employer] de pie o sentada por largos perodos de [place], o  con levantamientos repetitivos Tambin puede haber sensibilidad en los msculos en la zona superior de la espalda .  Dolor plvico posterior Environmental consultant en la parte posterior de la pelvis es ms frecuente que el dolor lumbar en el embarazo. Se trata de un dolor profundo que se siente a un lado en la cintura, o a travs del cxis (sacro), o en ambos lugares. Puede sentir dolor en uno o ambos lados Este dolor tambin puede sentirse en las nalgas y el dorso de los muslos Tambin puede haber dolor pbico y en la ingle. El dolor no se mejora rpidamente con el reposo, y [place] puede haber rigidez matutina. Muchas actividades pueden causarlo. Un buen estado fsico antes y [address] puede o no prevenir este problema. Las contracciones del parto suelen aparecer cada 1 a 2 minutos, tienen una duracin de aproximadamente 1 minuto, e implica una sensacin de empujar o presin en la pelvis. Sin embargo, si usted est a trmino con Firefighter, Chief Technology Officer constante en la zona lumbar puede indicar el comienzo de un parto prematuro, y usted debe ser consciente de ello.  DIAGNSTICO  No se deben tomar radiografas de la [employer] las primeras 12 a 14 semanas del Psychiatrist y durante el resto del [place],  slo cuando sea absolutamente necesario. La resonancia magntica no emite radiacin y es un estudio seguro durante el Psychiatristembarazo. Pero tambin se deben hacer solamente cuando sea absolutamente necesario.  INSTRUCCIONES PARA EL CUIDADO EN EL HOGAR   Realice actividad fsica segn las indicaciones del mdico. El ejercicio es la manera ms eficaz para prevenir o tratar Chief Technology Officerel dolor de espalda. Si tiene un problema en la espalda, es especialmente importante evitar los deportes que requieran de movimientos corporales rpidos. La natacin y las caminatas son las mejores [address] Placeactividades.  No permanezca sentada o de pie en el mismo lugar durante largos perodos.  No use tacos altos.  Sintese en la silla con  una buena postura. Use una almohada en su espalda baja si es necesario. Asegrese de que su cabeza descansa sobre sus hombros y no est colgando hacia delante.  Trate de dormir de lado, de preferencia el lado izquierdo, con una o The PNC Financialdos almohadas entre las piernas. Si est dolorida despus de una noche de descanso, la cama puede ser OGE Energydemasiado blanda.Trate de colocar una tabla entre el colchn y el somier.  Prstele atencin a su cuerpo cuando se levante.Si siente dolor,pida ayuda o trate de doblar las rodillas ms para Coventry Health Careutilizar los msculos de las piernas en lugar de los msculos de la espalda. Pngase en cuclillas al levantar algo del suelo. No se doble.  Consuma una dieta saludable. Trate de aumentar de peso dentro de las recomendaciones de su mdico.  Utilice compresas de calor o fro de 3 a 4 veces al da durante 15 minutos para Primary school teachercalmar el dolor.  Solo tome medicamentos que se pueden comprar sin receta o recetados para Chief Technology Officerel dolor, Dentistmalestar o fiebre, como le indica el mdico. Dolor de espaldas repentino (agudo).  Haga reposo en cama slo en caso de los episodios ms extremos y agudos de Engineer, miningdolor. El reposo prolongado en cama de ms de 48 horas agravar su trastorno.  El hielo es muy efectivo en los problemas agudos.  Ponga el hielo en una bolsa plstica.  Colquese una toalla entre la piel y la bolsa de hielo.  Deje el hielo durante 10 a 20 minutos cada 2 horas o segn lo nesecite, mientras se encuentre despierta.  Las compresas de calor durante 30 minutos antes de las actividades tambin puede ayudar. Dolor crnico en la espalda. Consulte a su mdico si el dolor es continuo. El mdico podr ayudarla o derivarla para que realice los ejercicios y trabajos de fortalecimiento apropiados. Con un buen entrenamiento fsico, podr evitar la mayor parte de los [place]problemas. En algunos casos, la causa es un problema ms grave. Debe ser controlada inmediatamente si aparecen nuevos problemas. El mdico  tambin podr recomendar:   Una faja de maternidad.  Un arns elstico.  Un cors para la espalda.  Un masajista o acupuntura. SOLICITE ATENCIN MDICA SI:   No puede Careers information officerrealizar la mayor parte de sus actividades diarias, an tomando los medicamentos para Psychologist, occupationalcalmar el dolor que le recetaron.  Beverlee NimsQuiere ser derivada a un fisioterapeuta o quiroprxico.  Beverlee NimsQuiere intentar con acupuntura. SOLICITE ATENCIN MDICA DE INMEDIATO SI:   Siente entumecimiento, hormigueo, debilidad o problemas con el uso de los brazos o las piernas.  Siente un dolor de espalda muy intenso que no se alivia con medicamentos.  Tiene modificaciones repentinas en el control de la vejiga o el intestino.  Aumenta el dolor en otras partes del cuerpo.  Siente que le falta el aire, se marea o sufre un [place]esmayo.  Tiene nuseas, vmitos o  sudoracin.  Siente un dolor en la espalda similar al del [place].  Cuando aparece [employer], rompe la bolsa de aguas o tiene un sangrado vaginal.  El dolor o el adormecimiento se extienden hacia la pierna.  El dolor aparece despus de una cada.  Siente dolor de un solo lado. Podra tener clculos renales.  Observa sangre en la orina. Podra tener una infeccin en la vejiga o clculos renales.  Siente dolor y aparecen ronchas. Podra tener culebrilla. El dolor de espalda es bastante frecuente durante el embarazo pero no debe aceptarse slo como parte del [place]. Siempre debe tratarse lo ms rpidamente posible. Har que su embarazo sea lo ms placentero posible.    Esta informacin no tiene Theme park manager el consejo del mdico. Asegrese de hacerle al mdico cualquier pregunta que tenga.   Document Released: 08/10/2011 Document Revised: 02/20/2012 Elsevier Interactive Patient Education [employer]

## 2016-09-28 MED ORDER — CYCLOBENZAPRINE HCL 5 MG PO TABS: 5.0000 mg | ORAL_TABLET | Freq: Three times a day (TID) | ORAL | 0 refills | Status: DC | PRN

## 2016-09-30 ENCOUNTER — Ambulatory Visit (INDEPENDENT_AMBULATORY_CARE_PROVIDER_SITE_OTHER): Payer: Self-pay | Admitting: Obstetrics and Gynecology

## 2016-09-30 VITALS — BP 113/67 | HR 79 | Wt 146.0 lb

## 2016-09-30 DIAGNOSIS — Z113 Encounter for screening for infections with a predominantly sexual mode of transmission: Secondary | ICD-10-CM

## 2016-09-30 DIAGNOSIS — O0993 Supervision of high risk pregnancy, unspecified, third trimester: Secondary | ICD-10-CM

## 2016-09-30 DIAGNOSIS — O09523 Supervision of elderly multigravida, third trimester: Secondary | ICD-10-CM

## 2016-09-30 DIAGNOSIS — Z789 Other specified health status: Secondary | ICD-10-CM

## 2016-09-30 LAB — OB RESULTS CONSOLE GBS: STREP GROUP B AG: NEGATIVE

## 2016-09-30 LAB — OB RESULTS CONSOLE GC/CHLAMYDIA: GC PROBE AMP, GENITAL: NEGATIVE

## 2016-09-30 NOTE — Addendum Note (Signed)
Addended by: Cheree DittoGRAHAM, Lekha Dancer A on: 09/30/2016 11:03 AM   Modules accepted: Orders

## 2016-09-30 NOTE — Patient Instructions (Signed)
Parto vaginal (Vaginal Delivery) Durante el parto, el mdico la ayudar a dar a luz a su beb. En elparto vaginal, deber pujar para que el beb salga por la vagina. Sin embargo, antes de que pueda sacar al beb, es necesario que ocurran ciertas cosas. La abertura del tero (cuello del tero) tiene que ablandarse, hacerse ms delgado y abrirse (dilatar) hasta que llegue a 10 cm. Adems, el beb tiene que bajar desde el tero a la vagina. SIGNOS DE TRABAJO DE PARTO  El mdico tendr primero que asegurarse de que usted est en trabajo de parto. Algunos signos son:   Eliminar lo que se llama tapn mucoso antes del inicio del trabajo de parto. Este es una pequea cantidad de mucosidad teida con sangre.  Tener contracciones uterinas regulares y dolorosas.   El tiempo entre las contracciones debe acortarse  Las molestias y el dolor se harn ms intensos gradualmente.  El dolor de las contracciones empeora al caminar y no se alivia con el reposo.   El cuello del tero se hace mas delgado (se borra) y se dilata. ANTES DEL PARTO Una vez que se inicie el trabajo de parto y sea admitida en el hospital o sanatorio, el mdico podr hacer lo siguiente:   Realizar un examen fsico.  Controlar si hay complicaciones relacionadas con el trabajo de parto.  Verificar su presin arterial, temperatura y pulso y la frecuencia cardaca (signos vitales).   Determinar si se ha roto el saco amnitico y cundo ha ocurrido.  Realizar un examen vaginal (utilizando un guante estril y un lubricante) para determinar:  La posicin (presentacin) del beb. El beb se presenta con la cabeza primero (vertex) en el canal de parto (vagina), o estn los pies o las nalgas primero (de nalgas)?  El nivel (estacin) de la cabeza del beb dentro del canal de parto.  El borramiento y la dilatacin del cuello uterino  El monitor fetal electrnico generalmente se coloca sobre el abdomen al llegar. Se utiliza para  controlar las contracciones y la frecuencia cardaca del beb.  Cuando el monitor est en el abdomen (monitor fetal externo), slo toma la frecuencia y la duracin de las contracciones. No informa acerca de la intensidad de las contracciones.  Si el mdico necesita saber exactamente la intensidad de las contracciones o cul es la frecuencia cardaca del beb, colocar un monitor interno en la vagina y el tero. El mdico comentar los riesgos y los beneficios de usar un monitor interno y le pedir autorizacin antes de colocar el dispositivo.  El monitoreo fetal continuo ser necesario si le han aplicado una epidural, si le administran ciertos medicamentos (como oxitocina) y si tiene complicaciones del embarazo o del trabajo de parto.  Podrn colocarle una va intravenosa en una vena del brazo para suministrarle lquidos y medicamentos, si es necesario. TRES ETAPAS DEL TRABAJO DE PARTO Y EL PARTO El trabajo de parto y el parto normales se dividen en tres etapas. Primera etapa Esta etapa comienza cuando comienzan las contracciones regulares y el cuello comienza a borrarse y dilatarse. Finaliza cuando el cuello est completamente abierto (completamente dilatado). La primera etapa es la etapa ms larga del trabajo de parto y puede durar desde 3 horas a 15 horas.  Algunos mtodos estn disponibles para ayudar con el dolor del parto. Usted y su mdico decidirn qu opcin es la mejor para usted. Las opciones incluyen:   Medicamentos narcticos. Estos son medicamentos fuertes que usted puede recibir a travs de una va intravenosa o   como inyeccin en el msculo. Estos medicamentos alivian el dolor pero no hacen que desaparezca completamente.  Epidural. Se administra un medicamento a travs de un tubo delgado que se inserta en la espalda. El medicamento adormece la parte inferior del cuerpo y evita el dolor en esa zona.  Bloqueo paracervical Es una inyeccin de un anestsico en cada lado del cuello  uterino.  Usted podr pedir un parto natural, que implica que no se usen analgsicos ni epidural durante el parto y el trabajo de parto. En cambio, podr tener otro tipo de ayuda como ejercicios respiratorios para hacer frente al dolor. Segunda etapa La segunda etapa del trabajo de parto comienza cuando el cuello se ha dilatado completamente a 10 cm. Contina hasta que usted puja al beb hacia abajo, por el canal de parto, y el beb nace. Esta etapa puede durar slo algunos minutos o algunas horas.  La posicin del la cabeza del beb a medida que pasa por el canal de parto, es informada como un nmero, llamado estacin. Si la cabeza del beb no ha iniciado su descenso, la estacin se describe como que est en menos 3 (-3). Cuando la cabeza del beb est en la estacin cero, est en el medio del canal de parto y se encaja en la pelvis. La estacin en la que se encuentra el beb indica el progreso de la segunda etapa del trabajo de parto.  Cuando el beb nace, el mdico lo sostendr con la cabeza hacia abajo para evitar que el lquido amnitico, el moco y la sangre entren en los pulmones del beb. La boca y la nariz del beb podrn ser succionadas con un pequeo bulbo para retirar todo lquido adicional.  El mdico podr colocar al beb sobre su estmago. Es importante evitar que el beb tome fro. Para hacerlo, el mdico secar al beb, lo colocar directamente sobre su piel, (sin mantas entre usted y el beb) y lo cubrir con mantas secas y tibias.  Se corta el cordn umbilical. Tercera etapa Durante la tercera etapa del trabajo de parto, el mdico sacar la placenta (alumbramiento) y se asegurar de que el sangrado est controlado. La salida de la placenta generalmente demora 5 minutos pero puede tardar hasta 30 minutos. Luego de la salida de la placenta, le darn un medicamento por va intravenosa o inyectable para ayudar a contraer el tero y controlar el sangrado. Si planea amamantar al beb,  puede intentar en este momento Luego de la salida de la placenta, el tero debe contraerse y quedar muy firme. Si el tero no queda firme, el mdico lo masajear. Esto es importante debido a que la contraccin del tero ayuda a cortar el sangrado en el sitio en que la placenta estaba unida al tero. Si el tero no se contrae adecuadamente ni permanece firme, podr causar un sangrado abundante. Si hay mucho sangrado, podrn darle medicamentos para contraer el tero y detener el sangrado.    Esta informacin no tiene como fin reemplazar el consejo del mdico. Asegrese de hacerle al mdico cualquier pregunta que tenga.   Document Released: 11/10/2008 Document Revised: 12/19/2014 Elsevier Interactive Patient Education 2016 Elsevier Inc.  

## 2016-09-30 NOTE — Progress Notes (Signed)
Subjective:  Cathy Nash is a 35 y.o. [id number] at 3877w0d being seen today for ongoing prenatal care.  She is currently monitored for the following issues for this high-risk pregnancy and has Rectocele, grade 3; Supervision of high-risk pregnancy; Asthma affecting pregnancy, antepartum; Short interval between pregnancies affecting pregnancy, antepartum; History of preterm premature rupture of membranes (PROM) in previous pregnancy, currently pregnant; Insufficient prenatal care; AMA (advanced maternal age) multigravida 35+; History of pre-eclampsia in prior pregnancy, currently pregnant; Language barrier; Cervical shortening affecting pregnancy; Preterm labor in third trimester; and Transverse presentation, antepartum, fetus 1 on her problem list.  Patient reports occasional contractions. Pt was seen in MAU on 10/17 for ut ctx. Given Procardia with resolution. Has has only occasional ut ctx since. [facility] and was given a course of BMZ Contractions: Irritability. Vag. Bleeding: Small.  Movement: Present. Denies leaking of fluid.   The following portions of the patient's history were reviewed and updated as appropriate: allergies, current medications, past family history, past medical history, past social history, past surgical history and problem list. Problem list updated.  Objective:   Vitals:   09/30/16 0952  BP: 113/67  Pulse: 79  Weight: 146 lb (66.2 kg)    Fetal Status: Fetal Heart Rate (bpm): 151   Movement: Present     General:  Alert, oriented and cooperative. Patient is in no acute distress.  Skin: Skin is warm and dry. No rash noted.   Cardiovascular: Normal heart rate noted  Respiratory: Normal respiratory effort, no problems with respiration noted  Abdomen: Soft, gravid, appropriate for gestational age. Pain/Pressure: Present     Pelvic:  Cervical exam performed        Extremities: Normal range of motion.  Edema: Trace   Mental Status: Normal mood and affect. Normal behavior. Normal judgment and thought content.   Urinalysis:      Assessment and Plan:  Pregnancy: [id number] at 1577w0d  1. Supervision of high risk pregnancy in third trimester Will check U/S for growth Desires BTL but pt has no insurance - Culture, beta strep (group b only) - GC/Chlamydia probe amp ([place])not at [facility]  2. Preterm labor in third trimester without delivery NO cervical chnage  3. Language barrier   4. Elderly multigravida in third trimester  Preterm labor symptoms and general obstetric precautions including but not limited to vaginal bleeding, contractions, leaking of fluid and fetal movement were reviewed in detail with the patient. Please refer to After Visit Summary for other counseling recommendations.  No Follow-up on file.   Hermina StaggersMichael L Ervin, MD

## 2016-09-30 NOTE — Progress Notes (Signed)
[employer] interpreter [phone] Patient reports a significant amount of pelvic pressure causing a lot of discomfort.

## 2016-10-02 LAB — CULTURE, BETA STREP (GROUP B ONLY)

## 2016-10-03 LAB — GC/CHLAMYDIA PROBE AMP (~~LOC~~) NOT AT ARMC
Chlamydia: NEGATIVE
NEISSERIA GONORRHEA: NEGATIVE

## 2016-10-10 ENCOUNTER — Ambulatory Visit (HOSPITAL_COMMUNITY)
Admission: RE | Admit: 2016-10-10 | Discharge: 2016-10-10 | Disposition: A | Discharge status: Home / Self Care | Payer: Self-pay | Source: Ambulatory Visit | Attending: Obstetrics and Gynecology | Admitting: Obstetrics and Gynecology

## 2016-10-10 ENCOUNTER — Other Ambulatory Visit: Payer: Self-pay | Admitting: Obstetrics and Gynecology

## 2016-10-10 ENCOUNTER — Encounter: Payer: Self-pay | Admitting: Family Medicine

## 2016-10-10 DIAGNOSIS — Z3A36 36 weeks gestation of pregnancy: Secondary | ICD-10-CM

## 2016-10-10 DIAGNOSIS — O0993 Supervision of high risk pregnancy, unspecified, third trimester: Secondary | ICD-10-CM

## 2016-10-10 DIAGNOSIS — O09893 Supervision of other high risk pregnancies, third trimester: Secondary | ICD-10-CM | POA: Insufficient documentation

## 2016-10-10 DIAGNOSIS — O365990 Maternal care for other known or suspected poor fetal growth, unspecified trimester, not applicable or unspecified: Secondary | ICD-10-CM | POA: Insufficient documentation

## 2016-10-10 DIAGNOSIS — O09299 Supervision of pregnancy with other poor reproductive or obstetric history, unspecified trimester: Secondary | ICD-10-CM | POA: Insufficient documentation

## 2016-10-10 DIAGNOSIS — J45909 Unspecified asthma, uncomplicated: Secondary | ICD-10-CM | POA: Insufficient documentation

## 2016-10-10 DIAGNOSIS — O9989 Other specified diseases and conditions complicating pregnancy, childbirth and the puerperium: Secondary | ICD-10-CM | POA: Insufficient documentation

## 2016-10-10 DIAGNOSIS — O09523 Supervision of elderly multigravida, third trimester: Secondary | ICD-10-CM | POA: Insufficient documentation

## 2016-10-10 DIAGNOSIS — O09219 Supervision of pregnancy with history of pre-term labor, unspecified trimester: Secondary | ICD-10-CM | POA: Insufficient documentation

## 2016-10-12 ENCOUNTER — Other Ambulatory Visit (HOSPITAL_COMMUNITY): Payer: Self-pay | Admitting: *Deleted

## 2016-10-15 ENCOUNTER — Inpatient Hospital Stay (HOSPITAL_COMMUNITY)
Admission: AD | Admit: 2016-10-15 | Discharge: 2016-10-19 | DRG: 766 | Disposition: A | Discharge status: Home / Self Care | Payer: Medicaid Other | Source: Ambulatory Visit | Attending: Obstetrics and Gynecology | Admitting: Obstetrics and Gynecology

## 2016-10-15 DIAGNOSIS — O4202 Full-term premature rupture of membranes, onset of labor within 24 hours of rupture: Secondary | ICD-10-CM | POA: Diagnosis present

## 2016-10-15 DIAGNOSIS — O9902 Anemia complicating childbirth: Secondary | ICD-10-CM | POA: Diagnosis present

## 2016-10-15 DIAGNOSIS — Z3A37 37 weeks gestation of pregnancy: Secondary | ICD-10-CM

## 2016-10-15 DIAGNOSIS — O321XX1 Maternal care for breech presentation, fetus 1: Secondary | ICD-10-CM

## 2016-10-15 DIAGNOSIS — O321XX0 Maternal care for breech presentation, not applicable or unspecified: Principal | ICD-10-CM | POA: Diagnosis present

## 2016-10-15 DIAGNOSIS — D509 Iron deficiency anemia, unspecified: Secondary | ICD-10-CM | POA: Diagnosis present

## 2016-10-15 DIAGNOSIS — Z87891 Personal history of nicotine dependence: Secondary | ICD-10-CM

## 2016-10-15 DIAGNOSIS — Z833 Family history of diabetes mellitus: Secondary | ICD-10-CM

## 2016-10-15 DIAGNOSIS — O329XX0 Maternal care for malpresentation of fetus, unspecified, not applicable or unspecified: Secondary | ICD-10-CM | POA: Diagnosis present

## 2016-10-15 DIAGNOSIS — R51 Headache: Secondary | ICD-10-CM | POA: Diagnosis not present

## 2016-10-15 DIAGNOSIS — O322XX1 Maternal care for transverse and oblique lie, fetus 1: Secondary | ICD-10-CM

## 2016-10-15 NOTE — MAU Note (Signed)
Pt reports having ctx on and offsince yestereday. Got stronger tonight had been having some vaginal bleeding as well no leaing. Good fetal movement reported

## 2016-10-16 ENCOUNTER — Inpatient Hospital Stay (HOSPITAL_COMMUNITY): Payer: Medicaid Other | Admitting: Anesthesiology

## 2016-10-16 ENCOUNTER — Encounter (HOSPITAL_COMMUNITY)
Admission: AD | Disposition: A | Discharge status: Home / Self Care | Payer: Self-pay | Source: Ambulatory Visit | Attending: Obstetrics and Gynecology

## 2016-10-16 ENCOUNTER — Encounter (HOSPITAL_COMMUNITY): Payer: Self-pay | Admitting: *Deleted

## 2016-10-16 DIAGNOSIS — O4292 Full-term premature rupture of membranes, unspecified as to length of time between rupture and onset of labor: Secondary | ICD-10-CM | POA: Diagnosis present

## 2016-10-16 DIAGNOSIS — D509 Iron deficiency anemia, unspecified: Secondary | ICD-10-CM | POA: Diagnosis present

## 2016-10-16 DIAGNOSIS — O329XX0 Maternal care for malpresentation of fetus, unspecified, not applicable or unspecified: Secondary | ICD-10-CM | POA: Diagnosis present

## 2016-10-16 DIAGNOSIS — R51 Headache: Secondary | ICD-10-CM | POA: Diagnosis not present

## 2016-10-16 DIAGNOSIS — O321XX0 Maternal care for breech presentation, not applicable or unspecified: Secondary | ICD-10-CM | POA: Diagnosis present

## 2016-10-16 DIAGNOSIS — O9902 Anemia complicating childbirth: Secondary | ICD-10-CM | POA: Diagnosis present

## 2016-10-16 DIAGNOSIS — Z3A37 37 weeks gestation of pregnancy: Secondary | ICD-10-CM

## 2016-10-16 DIAGNOSIS — O4202 Full-term premature rupture of membranes, onset of labor within 24 hours of rupture: Secondary | ICD-10-CM | POA: Diagnosis present

## 2016-10-16 DIAGNOSIS — Z833 Family history of diabetes mellitus: Secondary | ICD-10-CM | POA: Diagnosis not present

## 2016-10-16 DIAGNOSIS — O365930 Maternal care for other known or suspected poor fetal growth, third trimester, not applicable or unspecified: Secondary | ICD-10-CM

## 2016-10-16 DIAGNOSIS — Z87891 Personal history of nicotine dependence: Secondary | ICD-10-CM | POA: Diagnosis not present

## 2016-10-16 LAB — CBC
HCT: 28.5 % — ABNORMAL LOW (ref 36.0–46.0)
HEMATOCRIT: 25.4 % — AB (ref 36.0–46.0)
HEMOGLOBIN: 8.9 g/dL — AB (ref 12.0–15.0)
Hemoglobin: 10 g/dL — ABNORMAL LOW (ref 12.0–15.0)
MCH: 30 pg (ref 26.0–34.0)
MCH: 30.7 pg (ref 26.0–34.0)
MCHC: 35 g/dL (ref 30.0–36.0)
MCHC: 35.1 g/dL (ref 30.0–36.0)
MCV: 85.6 fL (ref 78.0–100.0)
MCV: 87.6 fL (ref 78.0–100.0)
PLATELETS: 298 10*3/uL (ref 150–400)
Platelets: 230 10*3/uL (ref 150–400)
RBC: 2.9 MIL/uL — AB (ref 3.87–5.11)
RBC: 3.33 MIL/uL — AB (ref 3.87–5.11)
RDW: 13.6 % (ref 11.5–15.5)
RDW: 13.7 % (ref 11.5–15.5)
WBC: 9.4 10*3/uL (ref 4.0–10.5)
WBC: 8.6 10*3/uL (ref 4.0–10.5)

## 2016-10-16 LAB — RPR: RPR: NONREACTIVE

## 2016-10-16 LAB — TYPE AND SCREEN
ABO/RH(D): O POS
ANTIBODY SCREEN: NEGATIVE

## 2016-10-16 SURGERY — Surgical Case
Anesthesia: Regional

## 2016-10-16 MED ORDER — SCOPOLAMINE 1 MG/3DAYS TD PT72
MEDICATED_PATCH | TRANSDERMAL | Status: DC | PRN
  Administered 2016-10-16: 1 via TRANSDERMAL

## 2016-10-16 MED ORDER — TERBUTALINE SULFATE 1 MG/ML IJ SOLN
0.2500 mg | Freq: Once | INTRAMUSCULAR | Status: AC
  Administered 2016-10-16: 0.25 mg via SUBCUTANEOUS

## 2016-10-16 MED ORDER — KETOROLAC TROMETHAMINE 30 MG/ML IJ SOLN
INTRAMUSCULAR | Status: AC
  Filled 2016-10-16: qty 1

## 2016-10-16 MED ORDER — LACTATED RINGERS IV SOLN
INTRAVENOUS | Status: DC | PRN
  Administered 2016-10-16 (×2): via INTRAVENOUS

## 2016-10-16 MED ORDER — OXYTOCIN 10 UNIT/ML IJ SOLN
INTRAMUSCULAR | Status: AC
  Filled 2016-10-16: qty 4

## 2016-10-16 MED ORDER — SCOPOLAMINE 1 MG/3DAYS TD PT72
1.0000 | MEDICATED_PATCH | Freq: Once | TRANSDERMAL | Status: DC
  Filled 2016-10-16: qty 1

## 2016-10-16 MED ORDER — SENNOSIDES-DOCUSATE SODIUM 8.6-50 MG PO TABS
2.0000 | ORAL_TABLET | Freq: Every evening | ORAL | Status: DC | PRN
  Administered 2016-10-17 – 2016-10-19 (×2): 2 via ORAL
  Filled 2016-10-16 (×2): qty 2

## 2016-10-16 MED ORDER — OXYCODONE HCL 5 MG PO TABS
5.0000 mg | ORAL_TABLET | ORAL | Status: DC | PRN
  Administered 2016-10-16 – 2016-10-17 (×3): 5 mg via ORAL
  Filled 2016-10-16 (×4): qty 1

## 2016-10-16 MED ORDER — MORPHINE SULFATE-NACL 0.5-0.9 MG/ML-% IV SOSY
PREFILLED_SYRINGE | INTRAVENOUS | Status: DC | PRN
  Administered 2016-10-16: .2 mg via INTRATHECAL

## 2016-10-16 MED ORDER — SODIUM CHLORIDE 0.9% FLUSH: 3.0000 mL | INTRAVENOUS | Status: DC | PRN

## 2016-10-16 MED ORDER — ACETAMINOPHEN 325 MG PO TABS
650.0000 mg | ORAL_TABLET | ORAL | Status: DC | PRN
  Administered 2016-10-16 – 2016-10-19 (×4): 650 mg via ORAL
  Filled 2016-10-16 (×4): qty 2

## 2016-10-16 MED ORDER — DIPHENHYDRAMINE HCL 25 MG PO CAPS
25.0000 mg | ORAL_CAPSULE | ORAL | Status: DC | PRN
Start: 2016-10-16 — End: 2016-10-19

## 2016-10-16 MED ORDER — TETANUS-DIPHTH-ACELL PERTUSSIS 5-2.5-18.5 LF-MCG/0.5 IM SUSP: 0.5000 mL | Freq: Once | INTRAMUSCULAR | Status: DC

## 2016-10-16 MED ORDER — NALOXONE HCL 2 MG/2ML IJ SOSY
1.0000 ug/kg/h | PREFILLED_SYRINGE | INTRAVENOUS | Status: DC | PRN
  Filled 2016-10-16: qty 2

## 2016-10-16 MED ORDER — ONDANSETRON HCL 4 MG/2ML IJ SOLN
INTRAMUSCULAR | Status: AC
  Filled 2016-10-16: qty 2

## 2016-10-16 MED ORDER — NALOXONE HCL 0.4 MG/ML IJ SOLN: 0.4000 mg | INTRAMUSCULAR | Status: DC | PRN

## 2016-10-16 MED ORDER — DIPHENHYDRAMINE HCL 25 MG PO CAPS: 25.0000 mg | ORAL_CAPSULE | Freq: Four times a day (QID) | ORAL | Status: DC | PRN

## 2016-10-16 MED ORDER — LACTATED RINGERS IV BOLUS (SEPSIS)
1000.0000 mL | Freq: Once | INTRAVENOUS | Status: AC
  Administered 2016-10-16: 1000 mL via INTRAVENOUS

## 2016-10-16 MED ORDER — PROPOFOL 10 MG/ML IV BOLUS
INTRAVENOUS | Status: AC
  Filled 2016-10-16: qty 20

## 2016-10-16 MED ORDER — SIMETHICONE 80 MG PO CHEW
80.0000 mg | CHEWABLE_TABLET | Freq: Three times a day (TID) | ORAL | Status: DC
  Administered 2016-10-16 – 2016-10-19 (×10): 80 mg via ORAL
  Filled 2016-10-16 (×10): qty 1

## 2016-10-16 MED ORDER — BUPIVACAINE IN DEXTROSE 0.75-8.25 % IT SOLN
INTRATHECAL | Status: DC | PRN
  Administered 2016-10-16: 12 mg via INTRATHECAL

## 2016-10-16 MED ORDER — FAMOTIDINE IN NACL 20-0.9 MG/50ML-% IV SOLN
INTRAVENOUS | Status: AC
  Filled 2016-10-16: qty 50

## 2016-10-16 MED ORDER — HYDROMORPHONE HCL 1 MG/ML IJ SOLN: 0.2500 mg | INTRAMUSCULAR | Status: DC | PRN

## 2016-10-16 MED ORDER — MORPHINE SULFATE (PF) 0.5 MG/ML IJ SOLN
INTRAMUSCULAR | Status: AC
  Filled 2016-10-16: qty 10

## 2016-10-16 MED ORDER — PROMETHAZINE HCL 25 MG/ML IJ SOLN: 6.2500 mg | INTRAMUSCULAR | Status: DC | PRN

## 2016-10-16 MED ORDER — NALBUPHINE HCL 10 MG/ML IJ SOLN: 5.0000 mg | Freq: Once | INTRAMUSCULAR | Status: DC | PRN

## 2016-10-16 MED ORDER — LACTATED RINGERS IV SOLN
INTRAVENOUS | Status: DC
  Administered 2016-10-16 (×2): via INTRAVENOUS

## 2016-10-16 MED ORDER — LIDOCAINE-EPINEPHRINE (PF) 2 %-1:200000 IJ SOLN
INTRAMUSCULAR | Status: DC | PRN
  Administered 2016-10-16: 3 mL
  Administered 2016-10-16: 5 mL via INTRADERMAL
  Administered 2016-10-16: 2 mL

## 2016-10-16 MED ORDER — DIPHENHYDRAMINE HCL 50 MG/ML IJ SOLN
12.5000 mg | INTRAMUSCULAR | Status: DC | PRN
  Administered 2016-10-16: 12.5 mg via INTRAVENOUS
  Filled 2016-10-16: qty 1

## 2016-10-16 MED ORDER — OXYCODONE HCL 5 MG PO TABS
10.0000 mg | ORAL_TABLET | ORAL | Status: DC | PRN
  Administered 2016-10-16 – 2016-10-19 (×9): 10 mg via ORAL
  Filled 2016-10-16 (×9): qty 2

## 2016-10-16 MED ORDER — FENTANYL CITRATE (PF) 100 MCG/2ML IJ SOLN
INTRAMUSCULAR | Status: DC | PRN
  Administered 2016-10-16: 10 ug via INTRATHECAL

## 2016-10-16 MED ORDER — KETOROLAC TROMETHAMINE 30 MG/ML IJ SOLN: 30.0000 mg | Freq: Four times a day (QID) | INTRAMUSCULAR | Status: AC | PRN

## 2016-10-16 MED ORDER — COCONUT OIL OIL
1.0000 "application " | TOPICAL_OIL | Status: DC | PRN
  Administered 2016-10-18: 1 via TOPICAL
  Filled 2016-10-16: qty 120

## 2016-10-16 MED ORDER — DIBUCAINE 1 % RE OINT
1.0000 "application " | TOPICAL_OINTMENT | RECTAL | Status: DC | PRN
  Administered 2016-10-16: 1 via RECTAL
  Filled 2016-10-16: qty 28

## 2016-10-16 MED ORDER — FAMOTIDINE IN NACL 20-0.9 MG/50ML-% IV SOLN
20.0000 mg | Freq: Once | INTRAVENOUS | Status: AC
  Administered 2016-10-16: 20 mg via INTRAVENOUS
  Filled 2016-10-16: qty 50

## 2016-10-16 MED ORDER — IBUPROFEN 600 MG PO TABS
600.0000 mg | ORAL_TABLET | Freq: Four times a day (QID) | ORAL | Status: DC | PRN
  Administered 2016-10-16 – 2016-10-17 (×4): 600 mg via ORAL
  Filled 2016-10-16 (×4): qty 1

## 2016-10-16 MED ORDER — ONDANSETRON HCL 4 MG/2ML IJ SOLN
4.0000 mg | Freq: Three times a day (TID) | INTRAMUSCULAR | Status: DC | PRN
Start: 2016-10-16 — End: 2016-10-19

## 2016-10-16 MED ORDER — WITCH HAZEL-GLYCERIN EX PADS
1.0000 "application " | MEDICATED_PAD | CUTANEOUS | Status: DC | PRN
  Administered 2016-10-16: 1 via TOPICAL

## 2016-10-16 MED ORDER — SCOPOLAMINE 1 MG/3DAYS TD PT72
MEDICATED_PATCH | TRANSDERMAL | Status: AC
  Filled 2016-10-16: qty 1

## 2016-10-16 MED ORDER — CEFAZOLIN SODIUM-DEXTROSE 2-4 GM/100ML-% IV SOLN
2.0000 g | INTRAVENOUS | Status: AC
Start: 2016-10-16 — End: 2016-10-16
  Administered 2016-10-16: 2 g via INTRAVENOUS
  Filled 2016-10-16: qty 100

## 2016-10-16 MED ORDER — NALBUPHINE HCL 10 MG/ML IJ SOLN: 5.0000 mg | INTRAMUSCULAR | Status: DC | PRN

## 2016-10-16 MED ORDER — PRENATAL MULTIVITAMIN CH
1.0000 | ORAL_TABLET | Freq: Every day | ORAL | Status: DC
  Administered 2016-10-16 – 2016-10-19 (×4): 1 via ORAL
  Filled 2016-10-16 (×4): qty 1

## 2016-10-16 MED ORDER — SIMETHICONE 80 MG PO CHEW
80.0000 mg | CHEWABLE_TABLET | ORAL | Status: DC
  Administered 2016-10-17 – 2016-10-19 (×3): 80 mg via ORAL
  Filled 2016-10-16 (×3): qty 1

## 2016-10-16 MED ORDER — PHENYLEPHRINE 8 MG IN D5W 100 ML (0.08MG/ML) PREMIX OPTIME
INJECTION | INTRAVENOUS | Status: DC | PRN
  Administered 2016-10-16: 50 ug/min via INTRAVENOUS

## 2016-10-16 MED ORDER — MORPHINE SULFATE-NACL 0.5-0.9 MG/ML-% IV SOSY
PREFILLED_SYRINGE | INTRAVENOUS | Status: AC
  Filled 2016-10-16: qty 1

## 2016-10-16 MED ORDER — SOD CITRATE-CITRIC ACID 500-334 MG/5ML PO SOLN
ORAL | Status: AC
  Filled 2016-10-16: qty 15

## 2016-10-16 MED ORDER — MEPERIDINE HCL 25 MG/ML IJ SOLN: 6.2500 mg | INTRAMUSCULAR | Status: DC | PRN

## 2016-10-16 MED ORDER — SIMETHICONE 80 MG PO CHEW: 80.0000 mg | CHEWABLE_TABLET | ORAL | Status: DC | PRN

## 2016-10-16 MED ORDER — SOD CITRATE-CITRIC ACID 500-334 MG/5ML PO SOLN
30.0000 mL | Freq: Once | ORAL | Status: AC
  Administered 2016-10-16: 30 mL via ORAL

## 2016-10-16 MED ORDER — PHENYLEPHRINE 8 MG IN D5W 100 ML (0.08MG/ML) PREMIX OPTIME
INJECTION | INTRAVENOUS | Status: AC
  Filled 2016-10-16: qty 100

## 2016-10-16 MED ORDER — ONDANSETRON HCL 4 MG/2ML IJ SOLN
INTRAMUSCULAR | Status: DC | PRN
  Administered 2016-10-16: 4 mg via INTRAVENOUS

## 2016-10-16 MED ORDER — FENTANYL CITRATE (PF) 100 MCG/2ML IJ SOLN
INTRAMUSCULAR | Status: AC
  Filled 2016-10-16: qty 2

## 2016-10-16 MED ORDER — OXYTOCIN 10 UNIT/ML IJ SOLN
INTRAVENOUS | Status: DC | PRN
  Administered 2016-10-16: 40 [IU] via INTRAVENOUS

## 2016-10-16 MED ORDER — LACTATED RINGERS IV SOLN: INTRAVENOUS | Status: DC

## 2016-10-16 MED ORDER — OXYTOCIN 40 UNITS IN LACTATED RINGERS INFUSION - SIMPLE MED: 2.5000 [IU]/h | INTRAVENOUS | Status: AC

## 2016-10-16 MED ORDER — MENTHOL 3 MG MT LOZG: 1.0000 | LOZENGE | OROMUCOSAL | Status: DC | PRN

## 2016-10-16 MED ORDER — LACTATED RINGERS IV SOLN
INTRAVENOUS | Status: DC
  Administered 2016-10-16: 01:00:00 via INTRAVENOUS

## 2016-10-16 MED ORDER — KETOROLAC TROMETHAMINE 30 MG/ML IJ SOLN
30.0000 mg | Freq: Four times a day (QID) | INTRAMUSCULAR | Status: AC | PRN
  Administered 2016-10-16: 30 mg via INTRAMUSCULAR

## 2016-10-16 MED ORDER — TERBUTALINE SULFATE 1 MG/ML IJ SOLN
INTRAMUSCULAR | Status: AC
  Filled 2016-10-16: qty 1

## 2016-10-16 SURGICAL SUPPLY — 27 items
CANISTER SUCT 3000ML PPV (MISCELLANEOUS) ×3 IMPLANT
CHLORAPREP W/TINT 26ML (MISCELLANEOUS) ×3 IMPLANT
DRSG OPSITE POSTOP 4X10 (GAUZE/BANDAGES/DRESSINGS) ×3 IMPLANT
ELECT REM PT RETURN 9FT ADLT (ELECTROSURGICAL) ×3
ELECTRODE REM PT RTRN 9FT ADLT (ELECTROSURGICAL) ×1 IMPLANT
GLOVE BIOGEL PI IND STRL 7.0 (GLOVE) ×2 IMPLANT
GLOVE BIOGEL PI IND STRL 7.5 (GLOVE) ×1 IMPLANT
GLOVE BIOGEL PI INDICATOR 7.0 (GLOVE) ×4
GLOVE BIOGEL PI INDICATOR 7.5 (GLOVE) ×2
GLOVE SKINSENSE NS SZ7.0 (GLOVE) ×2
GLOVE SKINSENSE STRL SZ7.0 (GLOVE) ×1 IMPLANT
GOWN STRL REUS W/ TWL LRG LVL3 (GOWN DISPOSABLE) ×2 IMPLANT
GOWN STRL REUS W/ TWL XL LVL3 (GOWN DISPOSABLE) ×1 IMPLANT
GOWN STRL REUS W/TWL LRG LVL3 (GOWN DISPOSABLE) ×6
GOWN STRL REUS W/TWL XL LVL3 (GOWN DISPOSABLE) ×3
LIQUID BAND (GAUZE/BANDAGES/DRESSINGS) ×3 IMPLANT
NS IRRIG 1000ML POUR BTL (IV SOLUTION) ×3 IMPLANT
PACK C SECTION WH (CUSTOM PROCEDURE TRAY) ×3 IMPLANT
PAD OB MATERNITY 4.3X12.25 (PERSONAL CARE ITEMS) ×3 IMPLANT
PAD PREP 24X48 CUFFED NSTRL (MISCELLANEOUS) ×3 IMPLANT
SUT MON AB 4-0 PS1 27 (SUTURE) ×3 IMPLANT
SUT MON AB-0 CT1 36 (SUTURE) ×6 IMPLANT
SUT PLAIN 2 0 (SUTURE) ×3
SUT PLAIN ABS 2-0 CT1 27XMFL (SUTURE) ×1 IMPLANT
SUT VIC AB 0 CT1 36 (SUTURE) ×6 IMPLANT
SUT VIC AB 3-0 CT1 27 (SUTURE) ×3
SUT VIC AB 3-0 CT1 TAPERPNT 27 (SUTURE) ×1 IMPLANT

## 2016-10-16 NOTE — Lactation Note (Addendum)
This note was copied from a baby's chart. Lactation Consultation Note  Patient Name: Cathy Nash ZOXWR'UToday's Date: 10/16/2016 Reason for consult: Initial assessment;Infant < 6lbs  Baby 16 hours old, 3849w2d GA and <5lbs. Mom declined interpreter. Mom nursing baby when this LC entered the room. Baby latched to tip of nipple and mom complaining of nipple pain. Discussed with mom that baby not latched deeply. Demonstrated how wide baby's mouth should be on breast, and attempted to latch baby deeper, but mom stated that she doesn't think the baby is getting anything and she wants to give formula. Mom reports that the baby nursed well right after delivery, but then has been sleepy at the breast. Discussed baby's weight and gestation with parents and reviewed LPI guidelines--mom preferred Spanish guidelines for FOB, but declined an interpreter stating that English is fine for her.   Set mom up with DEBP, mom states that she has used before. Reviewed assembly, disassembly and cleaning. Mom pumped and had drops on flanges, which were given to the baby with this LC's gloved finger. Assisted parents to supplement with Alimentum using finger and syringe. Plan is for mom to put baby to breast with cues and at least every 3 hours, then supplement with EBM/formula according to supplementation guidelines--which were given with review. Baby had nursed prior to supplementation, and would only tolerate 4 ml this first time. So enc increasing by offering in 5 ml increments. Enc mom to post-pump after each feeding followed by hand expression. Discussed progression of milk coming to volume and supply and demand. Patient's bedside RN, Shanda BumpsJessica, at bedside during supplementation and aware of the feeding plan--following LPI guidelines. Enc mom to limit total feeding time to 30 minutes and explained rationale.   [facility] and gave [facility] for BF [facility] to be [facility]    Maternal Data Has patient been taught Hand Expression?: Yes Does the patient have breastfeeding experience prior to this delivery?: Yes  Feeding Feeding Type: Formula  LATCH Score/Interventions Latch: Repeated attempts needed to sustain latch, nipple held in mouth throughout feeding, stimulation needed to elicit sucking reflex. Intervention(s): Adjust position;Assist with latch;Breast compression  Audible Swallowing: None  Type of Nipple: Everted at rest and after stimulation  Comfort (Breast/Nipple): Soft / non-tender     Hold (Positioning): Assistance needed to correctly position infant at breast and maintain latch. Intervention(s): Breastfeeding basics reviewed  LATCH Score: 6  Lactation Tools Discussed/Used Tools: Pump Breast pump type: Double-Electric Breast Pump WIC Program: Yes Pump Review: Setup, frequency, and cleaning;Milk Storage Initiated by:: JW Date initiated:: 10/16/16   Consult Status Consult Status: Follow-up Date: 10/17/16 Follow-up type: In-patient    Sherlyn HayJennifer D Annel Zunker 10/16/2016, 5:40 PM

## 2016-10-16 NOTE — Progress Notes (Addendum)
OB note  Bedside u/s shows breech with normal FHR. Fetus category I with accels and hard to tell UCs but pt appeared comfortable in room during 7716m d/w her and partner. SVE 6cm per CNM. With interpreter, d/w them recommendation for c-section for breech and they are amenable to this. Will give dose of terbutaline and proceed to OR for urgent c-section. Anesthesia aware. D/w pt re: BTL not done and long term contraceptive goals can be d/w her at a later date as she is understandably emotional, and patient has not signed the proper BTL papers and has had insufficient prenatal care for d/w patient in the outpatient setting.   Cathy Nash, Jr MD Attending Center for [facility](Faculty Practice)

## 2016-10-16 NOTE — Anesthesia Postprocedure Evaluation (Signed)
Anesthesia Post Note  Patient: Cathy Nash, Cathy Nash  Procedure(s) Performed: Procedure(s) (LRB): CESAREAN SECTION (N/A)  Patient location during evaluation: PACU Anesthesia Type: MAC and Combined Spinal/Epidural Level of consciousness: awake and alert Pain management: pain level controlled Vital Signs Assessment: post-procedure vital signs reviewed and stable Respiratory status: spontaneous breathing and respiratory function stable Cardiovascular status: blood pressure returned to baseline and stable Postop Assessment: spinal receding Anesthetic complications: no     Last Vitals:  Vitals:   10/16/16 0400 10/16/16 0500  BP: 95/60 (!) 98/54  Pulse: 65 70  Resp: 17 18  Temp: 36.7 C 36.8 C    Last Pain:  Vitals:   10/16/16 0500  TempSrc: Oral  PainSc: 3    Pain Goal: Patients Stated Pain Goal: 2 (10/16/16 0500)               Cathy Nash,Cathy Nash

## 2016-10-16 NOTE — Op Note (Signed)
Operative Note   SURGERY DATE: 10/16/2016  PRE-OP DIAGNOSIS:  *Pregnancy @ 37/2 *Active labor *Breech presentation *Fetal growth restriction (<10%, 2122gm on 10/30 with normal UA dopplers) *Limited prenatal care (four prenatal care visits)  POST-OP DIAGNOSIS: Same. Delivered   PROCEDURE: Urgent primary low transverse cesarean section via pfannenstiel skin incision with double layer uterine closure  SURGEON: Surgeon(s) and Role:    * Salamonia Bingharlie Brooklynne Pereida, MD - Primary  ASSISTANT: None  ANESTHESIA: spinal  ESTIMATED BLOOD LOSS: 500mL  DRAINS: 600mL UOP via indwelling foley  TOTAL IV FLUIDS: 2600mL crystalloid  VTE PROPHYLAXIS: SCDs to bilateral lower extremities  ANTIBIOTICS: Two grams of Cefazolin were given., within 1 hour of skin incision; unable to give azithromycin due to the urgent nature of the case  SPECIMENS: Placenta to pathology  COMPLICATIONS: none  FINDINGS: No intra-abdominal adhesions were noted. Grossly normal uterus, tubes and ovaries. clear amniotic fluid, frank breech female infant, weight 2165gm, APGARs 7/8, intact placenta.  PROCEDURE IN DETAIL: The patient was taken to the operating room where anesthesia was administered and normal fetal heart tones were confirmed. She was then prepped and draped in the normal fashion in the dorsal supine position with a leftward tilt.  After a time out was performed, a pfannensteil skin incision was made with the scalpel and carried through to the underlying layer of fascia. The fascia was then incised at the midline and this incision was extended laterally with the mayo scissors. Attention was turned to the superior aspect of the fascial incision which was grasped with the kocher clamps x 2, tented up and the rectus muscles were dissected off with the bovie. In a similar fashion the inferior aspect of the fascial incision was grasped with the kocher clamps, tented up and the rectus muscles dissected off with the mayo scissors.  The rectus muscles were then separated in the midline and the peritoneum was entered bluntly. The bladder blade was inserted and the vesicouterine peritoneum was identified, tented up and entered with the metzenbaum scissors. This incision was extended laterally and the bladder flap was created digitally. The bladder blade was reinserted.  A low transverse hysterotomy was made with the scalpel until the endometrial cavity was breached and the amniotic sac ruptured with the Allis clamp, yielding clear amniotic fluid. This incision was extended bluntly and the infant was easily delivered in breech position using the standard maneuvers. The cord was clamped x 2 and cut, and the infant was handed to the awaiting pediatricians, after delayed cord clamping was done.  The placenta was then gradually expressed from the uterus and then the uterus was exteriorized and cleared of all clots and debris. The hysterotomy was repaired with a running suture of 1-0 monocryl. A second imbricating layer of 1-0 monocryl suture was then placed. Several figure-of-eight sutures of monocryl were added to achieve excellent hemostasis.   The uterus and adnexa were then returned to the abdomen, and the hysterotomy and all operative sites were reinspected and excellent hemostasis was noted after irrigation and suction of the abdomen with warm saline.  The peritoneum was closed with a running stitch of 3-0 Vicryl. The fascia was reapproximated with 0 Vicryl in a simple running fashion bilaterally. The subcutaneous layer was then reapproximated with interrupted sutures of 2-0 plain gut, and the skin was then closed with 4-0 monocryl, in a subcuticular fashion.  The patient  tolerated the procedure well. Sponge, lap, needle, and instrument counts were correct x 2. The patient was transferred to the  recovery room awake, alert and breathing independently in stable condition.  Cathy Nash, Jr. MD Attending Center for [facility](Faculty Practice)

## 2016-10-16 NOTE — Addendum Note (Signed)
Addendum  created 10/16/16 1230 by Algis GreenhouseLinda A Jaquanda Wickersham, CRNA   Sign clinical note

## 2016-10-16 NOTE — H&P (Signed)
LABOR AND DELIVERY ADMISSION HISTORY AND PHYSICAL NOTE  Rush BarerLizzeth Lopez-Jarquin is a 35 y.o. female [phone] with IUP at 702w2d by 6 wk us presenting for SROM.   She reports positive fetal movement. She states that blood tinged vaginal discharge started around 6pm this evening with increasing frequency  Past Medical History: Past Medical History:  Diagnosis Date  . Anxiety   . Asthma   . Pregnancy induced hypertension    previous pregnancy  . Preterm labor    1st pregnancy d/t pre-e  . Pyelonephritis    June 2013  . Vaginal pessary in situ   . Varicose veins     Past Surgical History: Past Surgical History:  Procedure Laterality Date  . NO PAST SURGERIES      Obstetrical History: OB History    Gravida Para Term Preterm AB Living   5 4 2 2  0 4   SAB TAB Ectopic Multiple Live Births   0 0 0 0 4      Social History: Social History   Social History  . Marital status: Single    Spouse name: N/A  . Number of children: N/A  . Years of education: N/A   Social History Main Topics  . Smoking status: Former Games developermoker  . Smokeless tobacco: Never Used     Comment: yrs ago  . Alcohol use No  . Drug use: No  . Sexual activity: Not Currently    Partners: Male    Birth control/ protection: None   Other Topics Concern  . Not on file   Social History Narrative   ** Merged History Encounter **       ** Merged History Encounter **        Family History: Family History  Problem Relation Age of Onset  . Diabetes Mother   . Asthma Father   . Anesthesia problems Neg Hx   . Hypotension Neg Hx   . Malignant hyperthermia Neg Hx   . Pseudochol deficiency Neg Hx   . Other Neg Hx     Allergies: Allergies  Allergen Reactions  . Iohexol Hives and Shortness Of Breath     Code: SOB, Desc: IMMEDIATELY SOB FOLLOWING IV INJECTION, SPOTTY HIVES, PT GIVEN BENADRYL AND EPI-ARS 08/22/09, Onset Date: [id number]   . Latex Itching and Rash    Prescriptions Prior to Admission   Medication Sig Dispense Refill Last Dose  . acetaminophen (TYLENOL) 325 MG tablet Take 650 mg by mouth every 6 (six) hours as needed for mild pain, moderate pain or headache.   Taking  . cyclobenzaprine (FLEXERIL) 5 MG tablet Take 1 tablet (5 mg total) by mouth 3 (three) times daily as needed for muscle spasms. 10 tablet 0 Taking  . Prenatal Vit-Fe Fumarate-FA (PRENATAL MULTIVITAMIN) TABS tablet Take 1 tablet by mouth daily.    Taking     Review of Systems   All systems reviewed and negative except as stated in HPI  Blood pressure 122/62, pulse 81, temperature 98.7 F (37.1 C), resp. rate 18, height 5\' 2"  (1.575 m), weight 151 lb (68.5 kg), last menstrual period 01/18/2016, unknown if currently breastfeeding. General appearance: alert, cooperative, appears stated age and mild distress Heart: regular rate and rhythm with intact distal pulses Abdomen: soft, non-tender; bowel sounds normal Extremities: No calf swelling or tenderness Presentation: Breech Fetal monitoring: Category 1  Uterine activity: Q 5-6 minutes Dilation: 6 Exam by:: V.Smith,CNM   Prenatal labs: ABO, Rh: O/POS/-- (07/31 [id number]) Antibody: NEG (07/31 [id number]) Rubella: !Error!  RPR: NON REAC (09/11 0001)  HBsAg: NEGATIVE (07/31 0923)  HIV: NONREACTIVE (09/11 0001)  GBS: Negative (10/20 0000)  1 hr Glucola: Negative @ 23 4/7 Genetic screening:  declines Anatomy [place]: Normal, EFW <10% and AC <3%  Prenatal Transfer Tool  Maternal Diabetes: No Genetic Screening: Declined Maternal Ultrasounds/Referrals: Normal Fetal Ultrasounds or other Referrals:  Referred to Materal Fetal Medicine  Maternal Substance Abuse:  No Significant Maternal Medications:  None Significant Maternal Lab Results: None  No results found for this or any previous visit (from the past 24 hour(s)).  Patient Active Problem List   Diagnosis Date Noted  . Preterm labor in third trimester 09/16/2016  . Transverse presentation, antepartum, fetus 1  09/16/2016  . Cervical shortening affecting pregnancy 08/22/2016  . History of preterm premature rupture of membranes (PROM) in previous pregnancy, currently pregnant 07/11/2016  . Insufficient prenatal care 07/11/2016  . AMA (advanced maternal age) multigravida 35+ 07/11/2016  . History of pre-eclampsia in prior pregnancy, currently pregnant 07/11/2016  . Language barrier 07/11/2016  . Short interval between pregnancies affecting pregnancy, antepartum 08/31/2015  . Supervision of high-risk pregnancy 06/01/2015  . Asthma affecting pregnancy, antepartum 06/01/2015  . Rectocele, grade 3 03/05/2015    Assessment: Rush BarerLizzeth Lopez-Jarquin is a 35 y.o. [id number] at 4459w2d here for SROM with Breech presentation  #labor: Patient presents with SROM in the setting of breech presentation. Patient is being evaluated for Cesarean section #Pain: Anesthesia #FWB: Category 1  #ID:  GBS negative #MOF: Breast and bottle #MOC: Patient would like BTL #Circ:  NA  Kain Milosevic D Allysha Tryon 10/16/2016, 12:30 AM

## 2016-10-16 NOTE — Anesthesia Procedure Notes (Signed)
Combined SAB Epidural Patient location during procedure: OR Start time: 10/16/2016 12:07 AM End time: 10/16/2016 1:06 AM  Staffing Anesthesiologist: Heather RobertsSINGER, Devonda Pequignot Performed: anesthesiologist   Preanesthetic Checklist Completed: patient identified, site marked, surgical consent, pre-op evaluation, timeout performed, IV checked, risks and benefits discussed and monitors and equipment checked  Epidural Patient position: sitting Prep: DuraPrep Patient monitoring: heart rate, cardiac monitor, continuous pulse ox and blood pressure Approach: midline Location: L2-L3 Injection technique: LOR saline  Needle:  Needle type: Tuohy  Needle gauge: 17 G Needle length: 9 cm Needle insertion depth: 6 cm Catheter size: 20 Guage Catheter at skin depth: 11 cm Test dose: negative and Other  Assessment Events: blood not aspirated, injection not painful, no injection resistance and negative IV test  Additional Notes Informed consent obtained prior to proceeding including risk of failure, 1% risk of PDPH, risk of minor discomfort and bruising.  Discussed rare but serious complications including epidural abscess, permanent nerve injury, epidural hematoma.  Discussed alternatives to epidural analgesia and patient desires to proceed.  Timeout performed pre-procedure verifying patient name, procedure, and platelet count.  Patient tolerated procedure well. Reason for block:surgical anesthesia

## 2016-10-16 NOTE — Anesthesia Preprocedure Evaluation (Addendum)
Anesthesia Evaluation  Patient identified by MRN, date of birth, ID band Patient awake    Reviewed: Allergy & Precautions, NPO status , Patient's Chart, lab work & pertinent test results  Airway Mallampati: III  TM Distance: >3 FB Neck ROM: Full    Dental  (+) Teeth Intact, Dental Advisory Given   Pulmonary asthma , former smoker,    Pulmonary exam normal breath sounds clear to auscultation       Cardiovascular hypertension, negative cardio ROS Normal cardiovascular exam Rhythm:Regular Rate:Normal     Neuro/Psych PSYCHIATRIC DISORDERS Anxiety negative neurological ROS  negative psych ROS   GI/Hepatic negative GI ROS, Neg liver ROS,   Endo/Other  negative endocrine ROS  Renal/GU negative Renal ROS     Musculoskeletal negative musculoskeletal ROS (+)   Abdominal   Peds  Hematology  (+) Blood dyscrasia, anemia ,   Anesthesia Other Findings Day of surgery medications reviewed with the patient.  Reproductive/Obstetrics (+) Pregnancy Preterm labor at 34wks; PIH with previous pregnancy                             Anesthesia Physical  Anesthesia Plan  ASA: II  Anesthesia Plan: Combined Spinal and Epidural   Post-op Pain Management:    Induction:   Airway Management Planned:   Additional Equipment:   Intra-op Plan:   Post-operative Plan:   Informed Consent: I have reviewed the patients History and Physical, chart, labs and discussed the procedure including the risks, benefits and alternatives for the proposed anesthesia with the patient or authorized representative who has indicated his/her understanding and acceptance.   Dental advisory given  Plan Discussed with: CRNA, Surgeon and Anesthesiologist  Anesthesia Plan Comments: (Interpreter was used for the interview. )      Anesthesia Quick Evaluation

## 2016-10-16 NOTE — Transfer of Care (Signed)
Immediate Anesthesia Transfer of Care Note  Patient: Cathy Nash  Procedure(s) Performed: Procedure(s): CESAREAN SECTION (N/A)  Patient Location: PACU  Anesthesia Type:Spinal and Epidural  Level of Consciousness: awake  Airway & Oxygen Therapy: Patient Spontanous Breathing  Post-op Assessment: Report given to RN and Post -op Vital signs reviewed and stable  Post vital signs: stable  Last Vitals:  Vitals:   10/16/16 0400 10/16/16 0500  BP: 95/60 (!) 98/54  Pulse: 65 70  Resp: 17 18  Temp: 36.7 C 36.8 C    Last Pain:  Vitals:   10/16/16 0500  TempSrc: Oral  PainSc: 3       Patients Stated Pain Goal: 2 (10/16/16 0500)  Complications: No apparent anesthesia complications

## 2016-10-16 NOTE — Anesthesia Postprocedure Evaluation (Signed)
Anesthesia Post Note  Patient: Cathy Nash, Cathy Nash  Procedure(s) Performed: Procedure(s) (LRB): CESAREAN SECTION (N/A)  Patient location during evaluation: Mother Baby Anesthesia Type: Spinal Level of consciousness: awake Pain management: satisfactory to patient Vital Signs Assessment: post-procedure vital signs reviewed and stable Respiratory status: spontaneous breathing Cardiovascular status: stable Anesthetic complications: no     Last Vitals:  Vitals:   10/16/16 0720 10/16/16 0945  BP: (!) 90/58 (!) 90/51  Pulse: (!) 59 80  Resp: 18 18  Temp: 36.3 C 36.7 C    Last Pain:  Vitals:   10/16/16 1050  TempSrc:   PainSc: 4    Pain Goal: Patients Stated Pain Goal: 2 (10/16/16 0500)               Cephus ShellingBURGER,Minnie Shi

## 2016-10-16 NOTE — Addendum Note (Signed)
Addendum  created 10/16/16 0710 by Algis GreenhouseLinda A Terrisha Lopata, CRNA   Charge Capture section accepted

## 2016-10-17 ENCOUNTER — Ambulatory Visit (HOSPITAL_COMMUNITY)
Admission: RE | Admit: 2016-10-17 | Discharge: 2016-10-17 | Disposition: A | Discharge status: Home / Self Care | Payer: Self-pay | Source: Ambulatory Visit | Attending: Obstetrics and Gynecology | Admitting: Obstetrics and Gynecology

## 2016-10-17 ENCOUNTER — Encounter (HOSPITAL_COMMUNITY): Payer: Self-pay

## 2016-10-17 ENCOUNTER — Encounter (HOSPITAL_COMMUNITY): Payer: Self-pay | Admitting: *Deleted

## 2016-10-17 ENCOUNTER — Encounter: Payer: Self-pay | Admitting: Family Medicine

## 2016-10-17 MED ORDER — IBUPROFEN 600 MG PO TABS
600.0000 mg | ORAL_TABLET | Freq: Four times a day (QID) | ORAL | Status: DC
Start: 2016-10-17 — End: 2016-10-19
  Administered 2016-10-17 – 2016-10-19 (×9): 600 mg via ORAL
  Filled 2016-10-17 (×9): qty 1

## 2016-10-17 MED ORDER — POLYSACCHARIDE IRON COMPLEX 150 MG PO CAPS
150.0000 mg | ORAL_CAPSULE | Freq: Every day | ORAL | Status: DC
  Administered 2016-10-17 – 2016-10-19 (×3): 150 mg via ORAL
  Filled 2016-10-17 (×3): qty 1

## 2016-10-17 NOTE — Lactation Note (Addendum)
This note was copied from a baby's chart. Lactation Consultation Note  Patient Name: Cathy Rush BarerLizzeth Lopez-Jarquin WUJWJ'XToday's Date: 10/17/2016 Reason for consult: Follow-up assessment;Infant < 6lbs;Other (Comment) (37 2/7 LC ENCOURAGED TO PAGE FOR LATCH CHECK )  39 hours old, LC reviewed doc flow sheets - breastfeeding range 0 10 -30 mins. LS range - 10 - 6- 6-8-8. 5 Void , 1 stool, last fed at 3 pm -  Per mom breast 1st before 3 pm , and supplemented afterwards 11 ml. Wet diaper. Per mom has used the DEBP x 2 today with no results, LC  Discussed supply and demand, Early term infant, < 5 pounds , and the importance Of feeding only 15 mins at the breast and supplement afterwards - gradually increasing volume according to the LPT guidelines. LC showed mom the hand put.  LC also discussed with mom if the baby isn't latching at a feeding try supplementing 1st and then latch. LC stressed the importance of being consistent with post  Pumping after every feeding to enhance milk coming in, and if yield - save milk for the next feeding. LC reviewed cleaning and set up of pump . Per mom #24 flange  Is comfortable when pumping. LC also washed all mom's pump pieces.  Mom aware to call for Latch and feeding assessment on the nurses light for [facility] or [facility] RN.    Maternal Data    Feeding Feeding Type: Formula Nipple Type: Slow - flow Length of feed:  (reminded to feed every 3 hours and supplement per LPTI, pump)  LATCH Score/Interventions                Intervention(s): Breastfeeding basics reviewed     Lactation Tools Discussed/Used Tools: Pump (per mom #24 FLange feels comfortabLE ) Breast pump type: Double-Electric Breast Pump (per mom has only pumped x2 today and still not getting any thing )   Consult Status Consult Status: Follow-up Date: 10/17/16 Follow-up type: In-patient    Cathy Nash 10/17/2016, 5:06 PM

## 2016-10-17 NOTE — Progress Notes (Addendum)
POSTPARTUM PROGRESS NOTE  Post Operative Day 1 Subjective:  Cathy Nash is a 35 y.o. [id number] 1671w2d s/p pLTCS for breech.  No acute events overnight.  Pt denies problems with ambulating, voiding or po intake.  She denies nausea or vomiting.  Pain is moderately controlled.  She has not had flatus. She has not had bowel movement.  Lochia Minimal.   Objective: Blood pressure 107/63, pulse 67, temperature 97.9 F (36.6 C), temperature source Oral, resp. rate 18, height 5\' 2"  (1.575 m), weight 68.5 kg (151 lb), last menstrual period 01/18/2016, SpO2 97 %, unknown if currently breastfeeding.  Physical Exam:  General: alert, cooperative and no distress Lochia:normal flow Chest: no respiratory distress Heart:regular rate, distal pulses intact Incision: clean/dry/intact Abdomen: soft, nontender,  Uterine Fundus: firm, appropriately tender DVT Evaluation: No calf swelling or tenderness Extremities: no edema   Recent Labs  10/16/16 0015 10/16/16 1429  HGB 10.0* 8.9*  HCT 28.5* 25.4*    Assessment/Plan:  ASSESSMENT: Cathy Nash is a 35 y.o. [id number] 2771w2d s/p pLTCS for breech. Doing well overall. Continue post-partum care.   Iron-deficiency Anemia - Hgb 8.9, down from 10 pre-op. Start ferrous sulfate upon discharge.   Plan for discharge tomorrow   LOS: 1 day   Leeroy Bockndrew C PickensMD 10/17/2016, 10:55 AM   Midwife attestation Post Partum Day 1 I have seen and examined this patient and agree with above documentation in the student's note.   Cathy Nash is a 35 y.o. [id number] s/p PCS.  Pt denies problems with ambulating, voiding or po intake. Pain is well controlled. Method of Feeding: breast and formula  PE:  Heart: reg rate Lungs: normal WOB Incision: honeycomb dsg c/d/i, no edema or erythema Fundus firm Ext: soft, no pain, no edema  Plan for discharge: tomorrow  Donette LarryMelanie Kyro Joswick, CNM 10:39 AM

## 2016-10-18 MED ORDER — BUTALBITAL-APAP-CAFFEINE 50-325-40 MG PO TABS
1.0000 | ORAL_TABLET | Freq: Four times a day (QID) | ORAL | Status: DC | PRN
  Administered 2016-10-18 – 2016-10-19 (×3): 1 via ORAL
  Filled 2016-10-18 (×3): qty 1

## 2016-10-18 NOTE — Progress Notes (Addendum)
Subjective: Postpartum Day 2: Cesarean Delivery Patient reports: Headache, incisional pain, back pain at site of spinal, tolerating PO, + flatus and no problems voiding.   Headache is primary concern. Describes it as frontal, throbbing, 8/10 before pain meds. 6/10 after Motrin Percocet and Tylenol. No improvement w/ lying down. No weakness or difficulties w/ speech or gait.   Baby's birth weight was 4-12. Breast/bottle feeding.   Objective: Vital signs in last 24 hours: Temp:  [97.6 F (36.4 C)-98.5 F (36.9 C)] 98.3 F (36.8 C) (11/07 0616) Pulse Rate:  [59-83] 59 (11/07 0616) Resp:  [17-18] 17 (11/07 0616) BP: (99-112)/(57-65) 99/61 (11/07 0616) SpO2:  [99 %] 99 % (11/06 1948)  Physical Exam:  General: alert, cooperative, appears stated age and no distress Lochia: appropriate Uterine Fundus: firm Incision: no significant drainage. Honeycomb dressing in place. DVT Evaluation: No evidence of DVT seen on physical exam.   Recent Labs  10/16/16 0015 10/16/16 1429  HGB 10.0* 8.9*  HCT 28.5* 25.4*    Assessment/Plan: - Status post Cesarean section. Postoperative course complicated by Headache. Otherwise normal postoperatve pain well  controlled w/ pain meds. Doubt that this is a spinal HA and any emergent intracranial process.   - Add 1 Fioricet tab Q6 PRN. Encourage Caffeine intake and PO hydration - Anemia (may be contributing to HA)  - Start FeSO4   Plan D/C tomorrow  Cathy KinsmanVirginia Lilith Nash 10/18/2016, 10:38 AM  Interpreter used

## 2016-10-19 MED ORDER — IBUPROFEN 600 MG PO TABS: 600.0000 mg | ORAL_TABLET | Freq: Four times a day (QID) | ORAL | 0 refills | Status: DC

## 2016-10-19 MED ORDER — SENNOSIDES-DOCUSATE SODIUM 8.6-50 MG PO TABS: 2.0000 | ORAL_TABLET | Freq: Every evening | ORAL | 1 refills | Status: DC | PRN

## 2016-10-19 MED ORDER — OXYCODONE HCL 5 MG PO TABS
5.0000 mg | ORAL_TABLET | ORAL | 0 refills | Status: DC | PRN
Start: 2016-10-19 — End: 2016-10-19

## 2016-10-19 MED ORDER — SODIUM CHLORIDE 0.9 % IV SOLN
1000.0000 ug | Freq: Once | INTRAVENOUS | Status: DC
  Filled 2016-10-19: qty 1

## 2016-10-19 MED ORDER — OXYCODONE HCL 5 MG PO TABS: 5.0000 mg | ORAL_TABLET | ORAL | 0 refills | Status: DC | PRN

## 2016-10-19 NOTE — Progress Notes (Signed)
Pt c/o frontal headache again. No S&S PIH, BP 108/53. Baby fussy, pt not getting much sleep. Had tylenol and motrin at 0030, 10mg  oxy IR 0230 all for back and incisional pain. Dr. Evelene CroonSantos called and states okay to give fioricet now.

## 2016-10-19 NOTE — Progress Notes (Signed)
POSTPARTUM PROGRESS NOTE  Post Op Day 3 Subjective:  Cathy Nash is a 35 y.o. [id number] 8862w2d s/p pLTCS for breech.  No acute events overnight.  Pt denies problems with ambulating, voiding or po intake.  She denies nausea or vomiting.  Pain is moderately controlled.  She has had flatus. She has not had bowel movement.  Lochia Small.   Patient with complaints of bifrontal headache that is constant and only relieved with Fioricet.  Objective: Blood pressure (!) 109/56, pulse 70, temperature 98.6 F (37 C), temperature source Oral, resp. rate 16, height 5\' 2"  (1.575 m), weight 151 lb (68.5 kg), last menstrual period 01/18/2016, SpO2 99 %, unknown if currently breastfeeding.  Physical Exam:  General: alert, cooperative and no distress Lochia:normal flow Heart: RRR with intact distal pulses Abdomen: +BS, soft, nontender,  Uterine Fundus: firm,  DVT Evaluation: No calf swelling or tenderness   Recent Labs  10/16/16 1429  HGB 8.9*  HCT 25.4*    Assessment/Plan:  ASSESSMENT: Cathy Nash is a 35 y.o. [id number] 6662w2d s/p pLTCS  Plan for anesthesia evaluation to assess for headache 2/2 spinal anesthesia Plan to discharge if assessment returns normal and no new symptoms arise   LOS: 3 days   Cathy Legrand Rams Santos, MD 10/19/2016, 9:08 AM    OB FELLOW POSTPARTUM PROGRESS NOTE ATTESTATION  I have seen and examined this patient and agree with above documentation in the resident's note.   Jen MowElizabeth Zohra Clavel, DO OB Fellow

## 2016-10-19 NOTE — Discharge Instructions (Signed)

## 2016-10-19 NOTE — Addendum Note (Signed)
Addendum  created 10/19/16 0946 by Lewie LoronJohn Sheamus Hasting, MD   Order list changed, Sign clinical note

## 2016-10-19 NOTE — Lactation Note (Signed)
This note was copied from a baby's chart. Lactation Consultation Note  Patient Name: Cathy Nash Reason for consult: Follow-up assessment;Infant weight loss;Infant < 6lbs;Other (Comment) (early term infant ) [facility]. [facility]  [facility] DEBP ( Medela )  This afternoon. Mom already familiar with how to use the DEBP . Per mom pumped once today and got 20 ml  Around 12n and was so excited ( also mentioned fed it back to baby) . LC reviewed the plan and stressed the importance  Of making sure the baby's supplement increased today up to 30 ml after breast feeding 1st for 20 mins.  LC packed up all pump pieces and placed in a plastic bag for mom.  LC discussed mom returning appt. Next week Thursday 11/16 at 9 am , appt sheet was given to mom with explanation .  Mom and dad receptive to teaching. Mother informed of post-discharge support and given phone number to the lactation  department, including services for phone call assistance; out-patient appointments; and breastfeeding support group. List  of other breastfeeding resources in the community given in the handout. Encouraged mother to call for problems or concerns  related to breastfeeding.  Maternal Data Has patient been taught Hand Expression?:  (breast are fuller )  Feeding    LATCH Score/Interventions                Intervention(s): Breastfeeding basics reviewed     Lactation Tools Discussed/Used WIC Program: Yes (per mom [place] )   Consult Status Consult Status: Follow-up Date: 10/19/16    Cathy Nash Nash, 2:19 PM

## 2016-10-19 NOTE — Lactation Note (Signed)
This note was copied from a baby's chart. Lactation Consultation Note  Patient Name: Cathy Nash ZOXWR'UToday's Date: 10/19/2016 Reason for consult: Follow-up assessment;Infant weight loss;Infant < 6lbs;Other (Comment) (early term infant )  Baby is 6582 hours old, breast and formula , following the LPT policy .  Per mom has only pumped x2 in the last 24 hours with drops.  Per mom breast milk comes out easy , and LC assessed breast tissue with moms permission and noted the  Breast to be full and warm, not engorged. Sore nipple and engorgement prevention and tx reviewed.  Per mom active with WIC - [place] - [facility] , also called and left a message for the need for a DEBP.    Maternal Data Has patient been taught Hand Expression?:  (breast are fuller )  Feeding Feeding Type: Formula Nipple Type: Slow - flow Length of feed: 15 min (per mom )  LATCH Score/Interventions                Intervention(s): Breastfeeding basics reviewed     Lactation Tools Discussed/Used WIC Program: Yes (per mom [place] )   Consult Status Consult Status: Follow-up Date: 10/19/16    Kathrin GreathouseMargaret Ann Dyland Panuco 10/19/2016, 11:37 AM

## 2016-10-19 NOTE — Discharge Summary (Signed)
OB Discharge Summary     Patient Name: Cathy Nash DOB: 05/05/1981 MRN: [id number]  Date of admission: 10/15/2016 Delivering MD: [place] BingPICKENS, CHARLIE   Date of discharge: 10/19/2016  Admitting diagnosis: 37 wks pain and bleeding Intrauterine pregnancy: 3745w2d     Secondary diagnosis:  Active Problems:   * No active hospital problems. *  Additional problems: Breech     Discharge diagnosis: Term Pregnancy Delivered                                                                                                Post partum procedures:None  Augmentation: None  Complications: None  Hospital course:  Onset of Labor With Unplanned C/S  35 y.o. yo [id number] at 2845w2d was admitted in Active Labor on 10/15/2016. Patient had a labor course significant for Breech presentation. Membrane Rupture Time/Date: 1:22 AM ,10/16/2016   The patient went for cesarean section due to Malpresentation, and delivered a Viable infant,10/16/2016  Details of operation can be found in separate operative note. Patient had an uncomplicated postpartum course.  She is ambulating,tolerating a regular diet, passing flatus, and urinating well.  Patient is discharged home in stable condition 10/19/16.   Physical exam  Vitals:   10/18/16 0616 10/18/16 1817 10/19/16 0345 10/19/16 0640  BP: 99/61 113/62 (!) 108/53 (!) 109/56  Pulse: (!) 59 77 61 70  Resp: 17 18  16   Temp: 98.3 F (36.8 C)   98.6 F (37 C)  TempSrc: Oral   Oral  SpO2:      Weight:      Height:       General: alert, cooperative and no distress Lochia: appropriate Uterine Fundus: firm Incision: Healing well with no significant drainage, No significant erythema, Dressing is clean, dry, and intact DVT Evaluation: No evidence of DVT seen on physical exam. Labs: Lab Results  Component Value Date   WBC 8.6 10/16/2016   HGB 8.9 (L) 10/16/2016   HCT 25.4 (L) 10/16/2016   MCV 87.6 10/16/2016   PLT 230 10/16/2016   CMP Latest Ref Rng & Units  07/11/2016  Glucose 65 - 99 mg/dL 93  BUN 7 - 25 mg/dL 7  Creatinine 1.470.50 - 8.291.10 mg/dL 5.620.53  Sodium 130135 - 865146 mmol/L 135  Potassium 3.5 - 5.3 mmol/L 3.5  Chloride 98 - 110 mmol/L 104  CO2 20 - 31 mmol/L 22  Calcium 8.6 - 10.2 mg/dL 7.8(I8.4(L)  Total Protein 6.1 - 8.1 g/dL 5.9(L)  Total Bilirubin 0.2 - 1.2 mg/dL 0.4  Alkaline Phos 33 - 115 U/L 66  AST 10 - 30 U/L 12  ALT 6 - 29 U/L 7    Discharge instruction: per After Visit Summary and "Baby and Me Booklet".  After visit meds:    Medication List    STOP taking these medications   progesterone 200 MG capsule Commonly known as:  PROMETRIUM     TAKE these medications   acetaminophen 325 MG tablet Commonly known as:  TYLENOL Take 650 mg by mouth every 6 (six) hours as needed for mild pain, moderate pain or headache.   ibuprofen  600 MG tablet Commonly known as:  ADVIL,MOTRIN Take 1 tablet (600 mg total) by mouth every 6 (six) hours.   prenatal multivitamin Tabs tablet Take 1 tablet by mouth daily.   senna-docusate 8.6-50 MG tablet Commonly known as:  Senokot-S Take 2 tablets by mouth at bedtime as needed for mild constipation.       Diet: routine diet  Activity: Advance as tolerated. Pelvic rest for 6 weeks.   Outpatient follow up:6 weeks Follow up Appt: Future Appointments Date Time Provider Department Center  11/22/2016 1:20 PM Marlis EdelsonWalidah N Karim, CNM [facility]   Follow up Visit:No Follow-up on file.  Postpartum contraception: IUD vs Nexplanon: Patient would like a BTL  Newborn Data: Live born female  Birth Weight: 4 lb 12.4 oz (2165 g) APGAR: 7, 8  Baby Feeding: Breast Disposition:home with mother   10/19/2016 Deniece ReeJosue D Santos, MD    OB FELLOW DISCHARGE ATTESTATION  I have seen and examined this patient and agree with above documentation in the resident's note.   Jen MowElizabeth Jessi Jessop, DO OB Fellow

## 2016-10-19 NOTE — Progress Notes (Signed)
Headache eval Pt apparently complaining headache. No interpreter in the room and pt speaks very little english. She was sitting up on the side of the bed talking with her husband when I entered. She seemed comfortable. I'll order conservative measures (cosyntropin and fioricet) and will re-eval with interpreter later.

## 2016-10-21 ENCOUNTER — Encounter (HOSPITAL_COMMUNITY): Payer: Self-pay | Admitting: *Deleted

## 2016-10-21 ENCOUNTER — Emergency Department (HOSPITAL_COMMUNITY)
Admission: EM | Admit: 2016-10-21 | Discharge: 2016-10-21 | Disposition: A | Discharge status: Home / Self Care | Payer: Medicaid Other | Attending: Emergency Medicine | Admitting: Emergency Medicine

## 2016-10-21 ENCOUNTER — Emergency Department (HOSPITAL_COMMUNITY): Payer: Medicaid Other

## 2016-10-21 DIAGNOSIS — Z87891 Personal history of nicotine dependence: Secondary | ICD-10-CM | POA: Insufficient documentation

## 2016-10-21 DIAGNOSIS — J45909 Unspecified asthma, uncomplicated: Secondary | ICD-10-CM | POA: Insufficient documentation

## 2016-10-21 DIAGNOSIS — Y999 Unspecified external cause status: Secondary | ICD-10-CM | POA: Insufficient documentation

## 2016-10-21 DIAGNOSIS — S91312A Laceration without foreign body, left foot, initial encounter: Secondary | ICD-10-CM | POA: Insufficient documentation

## 2016-10-21 DIAGNOSIS — Y929 Unspecified place or not applicable: Secondary | ICD-10-CM | POA: Diagnosis not present

## 2016-10-21 DIAGNOSIS — Z91040 Latex allergy status: Secondary | ICD-10-CM | POA: Insufficient documentation

## 2016-10-21 DIAGNOSIS — Y939 Activity, unspecified: Secondary | ICD-10-CM | POA: Insufficient documentation

## 2016-10-21 DIAGNOSIS — W208XXA Other cause of strike by thrown, projected or falling object, initial encounter: Secondary | ICD-10-CM | POA: Insufficient documentation

## 2016-10-21 MED ORDER — LIDOCAINE HCL (PF) 1 % IJ SOLN
5.0000 mL | Freq: Once | INTRAMUSCULAR | Status: AC
  Administered 2016-10-21: 5 mL
  Filled 2016-10-21: qty 5

## 2016-10-21 NOTE — Discharge Instructions (Signed)
Follow up with your doctor, urgent care or here for suture removal in 7 to 10 days. Return sooner for signs of infection.

## 2016-10-21 NOTE — ED Provider Notes (Signed)
[facility]-EMER[facility]Y DEPT Provider Note   CSN: [id number] Arrival date & time: 10/21/16  1810  By signing my name below, I, Ether GriffinsKayla Croutch, attest that this documentation has been prepared under the direction and in the presence of  [facility] M. Damian LeavellNeese, NP. Electronically Signed:Kayla Croutch, ED Scribe. 10/21/16. 9:33 PM.   History   Chief Complaint Chief Complaint  Patient presents with  . Extremity Laceration    HPI Cathy Nash is a 35 y.o. female who presents to the Emergency Department complaining of a laceration with controlled bleeding to the top of the left foot that occurred earlier this afternoon. Relative states that pts kids were playing with a wooden dresser and caused the dresser to fall onto the pts left foot. Pt denies fall, LOC, head injury. Pt denies additional injuries. Tdap UTD.   The history is provided by the patient and a relative. No language interpreter was used.  Laceration   The incident occurred 3 to 5 hours ago. The laceration is located on the left foot. The laceration is 5 cm in size. Injury mechanism: wood dresser. The pain is moderate. The pain has been constant since onset. Possible foreign bodies include wood. Her tetanus status is UTD.    Past Medical History:  Diagnosis Date  . Anxiety   . Asthma   . Pregnancy induced hypertension    previous pregnancy  . Preterm labor    1st pregnancy d/t pre-e  . Pyelonephritis    June 2013  . Vaginal pessary in situ   . Varicose veins     Patient Active Problem List   Diagnosis Date Noted  . History of preterm premature rupture of membranes (PROM) in previous pregnancy, currently pregnant 07/11/2016  . AMA (advanced maternal age) multigravida 35+ 07/11/2016  . History of pre-eclampsia in prior pregnancy, currently pregnant 07/11/2016  . Language barrier 07/11/2016  . Supervision of high-risk pregnancy 06/01/2015  . Asthma affecting pregnancy, antepartum 06/01/2015  . Rectocele, grade 3 03/05/2015     Past Surgical History:  Procedure Laterality Date  . CESAREAN SECTION N/A 10/16/2016   Procedure: CESAREAN SECTION;  Surgeon: [place] Bingharlie Pickens, MD;  [facility];  Service: Obstetrics;  Laterality: N/A;  . NO PAST SURGERIES      OB History    Gravida Para Term Preterm AB Living   5 5 3 2  0 5   SAB TAB Ectopic Multiple Live Births   0 0 0 0 5       Home Medications    Prior to Admission medications   Medication Sig Start Date End Date Taking? Authorizing Provider  acetaminophen (TYLENOL) 325 MG tablet Take 650 mg by mouth every 6 (six) hours as needed for mild pain, moderate pain or headache.    Historical Provider, MD  ibuprofen (ADVIL,MOTRIN) 600 MG tablet Take 1 tablet (600 mg total) by mouth every 6 (six) hours. 10/19/16   Josue Legrand Rams Santos, MD  oxyCODONE (OXY IR/ROXICODONE) 5 MG immediate release tablet Take 1 tablet (5 mg total) by mouth every 4 (four) hours as needed (pain scale 4-7). 10/19/16   Tillman SersAngela C Riccio, DO  Prenatal Vit-Fe Fumarate-FA (PRENATAL MULTIVITAMIN) TABS tablet Take 1 tablet by mouth daily.     Historical Provider, MD  senna-docusate (SENOKOT-S) 8.6-50 MG tablet Take 2 tablets by mouth at bedtime as needed for mild constipation. 10/19/16   Deniece ReeJosue D Santos, MD    Family History Family History  Problem Relation Age of Onset  . Diabetes Mother   .  Asthma Father   . Anesthesia problems Neg Hx   . Hypotension Neg Hx   . Malignant hyperthermia Neg Hx   . Pseudochol deficiency Neg Hx   . Other Neg Hx     Social History Social History  Substance Use Topics  . Smoking status: Former Games developer  . Smokeless tobacco: Never Used     Comment: yrs ago  . Alcohol use No     Allergies   Iohexol and Latex   Review of Systems Review of Systems  Skin: Positive for wound.  Neurological: Negative for syncope.  All other systems reviewed and are negative.   Physical Exam Updated Vital Signs BP 112/59 (BP Location: Left Arm)   Pulse 81   Temp  98.2 F (36.8 C) (Oral)   Resp 16   Ht 5\' 2"  (1.575 m)   LMP 01/18/2016 (Approximate)   SpO2 100%   Physical Exam  Constitutional: She appears well-developed and well-nourished. No distress.  HENT:  Head: Normocephalic and atraumatic.  Eyes: EOM are normal.  Neck: Neck supple.  Cardiovascular: Normal rate.   Pulmonary/Chest: Effort normal.  Musculoskeletal: She exhibits no edema.  Dorsiflexion and plantarflexion without difficulty. 5 cm laceration to the dorsum of the left foot near the ankle.  Neurological: She is alert.  Skin: Skin is warm and dry.  Psychiatric: She has a normal mood and affect. Her behavior is normal.  Nursing note and vitals reviewed.    ED Treatments / Results  DIAGNOSTIC STUDIES: Oxygen Saturation is 100% on RA, normal by my interpretation.   COORDINATION OF CARE: 7:40 PM-Discussed next steps with pt which includes laceration repair, XR. Pt verbalized understanding and is agreeable   Radiology Dg Foot Complete Left  Result Date: 10/21/2016 CLINICAL DATA:  Drawer fell on left foot. Laceration with bleeding. Initial encounter. EXAM: LEFT FOOT - COMPLETE 3+ VIEW COMPARISON:  None. FINDINGS: Soft tissue laceration along the dorsal aspect of the midfoot. Normal anatomic alignment. No evidence for acute fracture or dislocation. IMPRESSION: Soft tissue laceration along the dorsal aspect of the midfoot. No evidence for acute osseous abnormality. Electronically Signed   By: Annia Belt M.D.   On: 10/21/2016 20:20    Procedures .Marland KitchenLaceration Repair Date/Time: 10/21/2016 9:24 PM Performed by: Janne Napoleon Authorized by: Janne Napoleon   Consent:    Consent obtained:  Verbal   Consent given by:  Patient   Risks discussed:  Infection, pain, poor cosmetic result and poor wound healing   Alternatives discussed:  No treatment Universal protocol:    Procedure explained and questions answered to patient or proxy's satisfaction: yes     Imaging studies  available: yes     Immediately prior to procedure, a time out was called: yes     Patient identity confirmed:  Verbally with patient Anesthesia (see MAR for exact dosages):    Anesthesia method:  Local infiltration   Local anesthetic:  Lidocaine 1% w/o epi Laceration details:    Location:  Foot   Foot location:  Top of L foot   Length (cm):  5 Repair type:    Repair type:  Simple Pre-procedure details:    Preparation:  Patient was prepped and draped in usual sterile fashion and imaging obtained to evaluate for foreign bodies Exploration:    Wound exploration: wound explored through full range of motion   Treatment:    Area cleansed with:  Betadine and saline   Amount of cleaning:  Standard Skin repair:  Repair method:  Sutures   Suture size:  4-0   Suture material:  Prolene   Suture technique:  Simple interrupted   Number of sutures:  10 Approximation:    Approximation:  Close   Vermilion border: well-aligned   Post-procedure details:    Dressing:  Sterile dressing and antibiotic ointment   Patient tolerance of procedure:  Tolerated well, no immediate complications      Medications Ordered in ED Medications  lidocaine (PF) (XYLOCAINE) 1 % injection 5 mL (5 mLs Infiltration Given 10/21/16 2054)     Initial Impression / Assessment and Plan / ED Course  I have reviewed the triage vital signs and the nursing notes.  Pertinent imaging results that were available during my care of the patient were reviewed by me and considered in my medical decision making (see chart for details).  Clinical Course     Tetanus UTD. Laceration occurred < 12 hours prior to repair. Discussed laceration care with pt and answered questions. Pt to f-u for suture removal in 7-10 days and wound check sooner should there be signs of dehiscence or infection. Pt is hemodynamically stable with no complaints prior to dc.      Final Clinical Impressions(s) / ED Diagnoses   Final diagnoses:    Laceration of left foot, initial encounter    New Prescriptions Discharge Medication List as of 10/21/2016  9:45 PM      I personally performed the services described in this documentation, which was scribed in my presence. The recorded information has been reviewed and is accurate.    ClydeHope M Neese, NP 10/22/16 0104    Charlynne Panderavid Hsienta Yao, MD 10/22/16 1046

## 2016-10-21 NOTE — ED Notes (Signed)
Pt stated she had a c-section 4 days ago and took pain meds in the room while waiting on provider. They were ibuprofen 600mg  1 tab. And oxycodone 5mg  I tab.

## 2016-10-21 NOTE — ED Triage Notes (Signed)
Pt states a drawer fell on her L foot.  2 inch lac with active bleeding.  4 days post c-section.  No complaints in that regard.

## 2016-10-25 ENCOUNTER — Encounter: Payer: Self-pay | Admitting: Obstetrics & Gynecology

## 2016-10-25 ENCOUNTER — Other Ambulatory Visit (HOSPITAL_COMMUNITY): Payer: Self-pay

## 2016-10-25 ENCOUNTER — Emergency Department (HOSPITAL_COMMUNITY)
Admission: EM | Admit: 2016-10-25 | Discharge: 2016-10-26 | Disposition: A | Discharge status: Home / Self Care | Payer: Self-pay | Attending: Emergency Medicine | Admitting: Emergency Medicine

## 2016-10-25 ENCOUNTER — Encounter (HOSPITAL_COMMUNITY): Payer: Self-pay | Admitting: Emergency Medicine

## 2016-10-25 DIAGNOSIS — Z87891 Personal history of nicotine dependence: Secondary | ICD-10-CM | POA: Insufficient documentation

## 2016-10-25 DIAGNOSIS — O9089 Other complications of the puerperium, not elsewhere classified: Secondary | ICD-10-CM | POA: Insufficient documentation

## 2016-10-25 DIAGNOSIS — Z79899 Other long term (current) drug therapy: Secondary | ICD-10-CM | POA: Insufficient documentation

## 2016-10-25 DIAGNOSIS — T148XXA Other injury of unspecified body region, initial encounter: Secondary | ICD-10-CM

## 2016-10-25 DIAGNOSIS — L089 Local infection of the skin and subcutaneous tissue, unspecified: Secondary | ICD-10-CM

## 2016-10-25 DIAGNOSIS — L03116 Cellulitis of left lower limb: Secondary | ICD-10-CM | POA: Insufficient documentation

## 2016-10-25 DIAGNOSIS — J45909 Unspecified asthma, uncomplicated: Secondary | ICD-10-CM | POA: Insufficient documentation

## 2016-10-25 DIAGNOSIS — Z91040 Latex allergy status: Secondary | ICD-10-CM | POA: Insufficient documentation

## 2016-10-25 NOTE — ED Triage Notes (Addendum)
Pt states she dropped a piece of furniture on her left foot on Friday night and had stitches placed; pt is concerned the area is infected; swelling present and minor redness noted to area  Pt is also 2 weeks post-partum and is c/o lower abdominal pain associated with location of C-section; no redness or swelling noted to lower abdomen; pt states she took pain medicine around 10 pm

## 2016-10-26 ENCOUNTER — Emergency Department (HOSPITAL_COMMUNITY): Payer: Self-pay

## 2016-10-26 MED ORDER — CEPHALEXIN 500 MG PO CAPS
500.0000 mg | ORAL_CAPSULE | Freq: Once | ORAL | Status: AC
  Administered 2016-10-26: 500 mg via ORAL
  Filled 2016-10-26: qty 1

## 2016-10-26 MED ORDER — CEPHALEXIN 500 MG PO CAPS: 500.0000 mg | ORAL_CAPSULE | Freq: Four times a day (QID) | ORAL | 0 refills | Status: DC

## 2016-10-26 MED ORDER — IBUPROFEN 600 MG PO TABS: 600.0000 mg | ORAL_TABLET | Freq: Three times a day (TID) | ORAL | 0 refills | Status: DC

## 2016-10-26 MED ORDER — IBUPROFEN 200 MG PO TABS
600.0000 mg | ORAL_TABLET | Freq: Once | ORAL | Status: AC
  Administered 2016-10-26: 600 mg via ORAL
  Filled 2016-10-26: qty 3

## 2016-10-26 NOTE — Discharge Instructions (Signed)
You were seen tonight for a wound infection on your foot. You will need to start antibiotics. You will take these until they are finished. You may take tylenol or ibuprofen, or the pain medications you were previously prescribed, to help your pain. Follow up with a primary care provider or return to the ED in two days to assure improvement. Return to the ED should symptoms worsen.

## 2016-10-26 NOTE — ED Notes (Signed)
Used Will, [phone] for interpreter to admin medication .

## 2016-10-26 NOTE — ED Notes (Signed)
Introduction and assessment performed by Maurine Ministerennis, [phone] interpreter.

## 2016-10-26 NOTE — ED Provider Notes (Signed)
[facility] Provider Note   CSN: [id number] Arrival date & time: 10/25/16  2313  By signing my name below, I, Cathy Nash, attest that this documentation has been prepared under the direction and in the presence of Gianlucas Evenson, PA-C Electronically Signed: Soijett Nash, ED Scribe. 10/26/16. 1:18 AM.  History   Chief Complaint Chief Complaint  Patient presents with  . Wound Check    HPI Cathy Nash is a 35 y.o. female who presents to the ED today complaining of a suspected wound infection beginning yesterday. Patient was originally injured on November 10 when the dresser fell on her left foot. She reports she was seen in the ED on 10/21/2016 for laceration of her left foot that was repaired while in the ED. She states that she is having associated symptoms of left foot swelling, possibly purulent drainage from left foot wound, and tingling to left foot. She states that she has tried Rx pain medications for the relief for her symptoms. She denies fever, numbness, or any other complaints.  Pt denies hx of MRSA.   Pt secondarily complains of wanting her C-section wound checked due to increased pain onset 4 days ago. Pt notes that she had a C-section completed 8 days ago and she is currently breastfeeding. Pt notes that she has an appointment with her OB-GYN in 2 days. Pt has tried Rx pain medications for the relief of her symptoms. Patient denies swelling, redness, or discharge from this wound.  The history is provided by the patient. A language interpreter was used ([place]).    Past Medical History:  Diagnosis Date  . Anxiety   . Asthma   . Pregnancy induced hypertension    previous pregnancy  . Preterm labor    1st pregnancy d/t pre-e  . Pyelonephritis    June 2013  . Vaginal pessary in situ   . Varicose veins     Patient Active Problem List   Diagnosis Date Noted  . History of preterm premature rupture of membranes (PROM) in previous pregnancy, currently  pregnant 07/11/2016  . AMA (advanced maternal age) multigravida 35+ 07/11/2016  . History of pre-eclampsia in prior pregnancy, currently pregnant 07/11/2016  . Language barrier 07/11/2016  . Supervision of high-risk pregnancy 06/01/2015  . Asthma affecting pregnancy, antepartum 06/01/2015  . Rectocele, grade 3 03/05/2015    Past Surgical History:  Procedure Laterality Date  . CESAREAN SECTION N/A 10/16/2016   Procedure: CESAREAN SECTION;  Surgeon: Sledge Bing, MD;  Location: [facility] BIRTHING SUITES;  Service: Obstetrics;  Laterality: N/A;  . NO PAST SURGERIES      OB History    Gravida Para Term Preterm AB Living   5 5 3 2  0 5   SAB TAB Ectopic Multiple Live Births   0 0 0 0 5       Home Medications    Prior to Admission medications   Medication Sig Start Date End Date Taking? Authorizing Provider  acetaminophen (TYLENOL) 325 MG tablet Take 650 mg by mouth every 6 (six) hours as needed for mild pain, moderate pain or headache.   Yes Historical Provider, MD  ibuprofen (ADVIL,MOTRIN) 600 MG tablet Take 1 tablet (600 mg total) by mouth every 6 (six) hours. 10/19/16  Yes Josue Legrand Rams, MD  oxyCODONE (OXY IR/ROXICODONE) 5 MG immediate release tablet Take 1 tablet (5 mg total) by mouth every 4 (four) hours as needed (pain scale 4-7). 10/19/16  Yes Tillman Sers, DO  cephALEXin (KEFLEX) 500 MG capsule Take  1 capsule (500 mg total) by mouth 4 (four) times daily. 10/26/16   Lorelee Mclaurin C Tasmin Exantus, PA-C  ibuprofen (ADVIL,MOTRIN) 600 MG tablet Take 1 tablet (600 mg total) by mouth 3 (three) times daily. 10/26/16   Nai Borromeo C Coleman Kalas, PA-C  senna-docusate (SENOKOT-S) 8.6-50 MG tablet Take 2 tablets by mouth at bedtime as needed for mild constipation. Patient not taking: Reported on 10/26/2016 10/19/16   Deniece ReeJosue D Santos, MD    Family History Family History  Problem Relation Age of Onset  . Diabetes Mother   . Asthma Father   . Anesthesia problems Neg Hx   . Hypotension Neg Hx   . Malignant hyperthermia  Neg Hx   . Pseudochol deficiency Neg Hx   . Other Neg Hx     Social History Social History  Substance Use Topics  . Smoking status: Former Games developermoker  . Smokeless tobacco: Never Used     Comment: yrs ago  . Alcohol use No     Allergies   Iohexol and Latex   Review of Systems Review of Systems  Constitutional: Negative for chills and fever.  Gastrointestinal: Negative for abdominal pain, blood in stool, nausea and vomiting.  Musculoskeletal: Positive for arthralgias (left foot) and joint swelling (left foot).  Skin: Positive for wound (healing laceration to left foot).       +Watery drainage from left foot  Neurological: Negative for weakness and numbness.  All other systems reviewed and are negative.    Physical Exam Updated Vital Signs BP 113/86 (BP Location: Right Arm)   Pulse 91   Temp 98 F (36.7 C) (Oral)   Resp 18   Ht 5\' 2"  (1.575 m)   LMP 01/18/2016 (Approximate)   SpO2 97%   Physical Exam  Constitutional: She appears well-developed and well-nourished. No distress.  HENT:  Head: Normocephalic and atraumatic.  Eyes: Conjunctivae are normal.  Neck: Neck supple.  Cardiovascular: Normal rate, regular rhythm, normal heart sounds and intact distal pulses.   Pulmonary/Chest: Effort normal and breath sounds normal. No respiratory distress.  Abdominal: Soft. There is no tenderness. There is no guarding.  No tenderness out proportion with pt recent surgical procedure. Wound is clean, dry, non-erythematous. No signs of infection or dehiscence. No increased warmth.   Musculoskeletal: She exhibits no edema.  Motor intact to left foot and ankle. Strength 5/5. Questionable paresthesias in the left toes.   Lymphadenopathy:    She has no cervical adenopathy.  Neurological: She is alert.  Skin: Skin is warm and dry. Laceration noted. She is not diaphoretic.  Laceration to dorsum of left foot with surrounding swelling and erythema. Tenderness and increased warmth to the  area. Evidence of previous, possibly purulent, discharge. No active discharge. 10 simple interrupted sutures are in place. No dehiscence of the wound noted.  Psychiatric: She has a normal mood and affect. Her behavior is normal.  Nursing note and vitals reviewed.   ED Treatments / Results  DIAGNOSTIC STUDIES: Oxygen Saturation is 97% on RA, nl by my interpretation.    COORDINATION OF CARE: 1:14 AM Discussed treatment plan with pt at bedside which includes left foot xray, abx Rx, and pt agreed to plan.   Labs (all labs ordered are listed, but only abnormal results are displayed) Labs Reviewed - No data to display  EKG  EKG Interpretation None       Radiology Dg Foot Complete Left  Result Date: 10/26/2016 CLINICAL DATA:  Stitches placed in left foot 5 days ago, with worsening  inflammation and pain. Oozing from the incision site. Initial encounter. EXAM: LEFT FOOT - COMPLETE 3+ VIEW COMPARISON:  Left foot radiographs performed 10/21/2016 FINDINGS: There is no evidence of fracture or dislocation. The joint spaces are preserved. There is no evidence of talar subluxation; the subtalar joint is unremarkable in appearance. A bipartite medial sesamoid of the first toe is noted. Mild soft tissue swelling is noted about the forefoot. IMPRESSION: 1. No evidence of fracture or dislocation. 2. Bipartite medial sesamoid of the first toe. 3. Mild soft tissue swelling about the forefoot. Electronically Signed   By: Roanna RaiderJeffery  Chang M.D.   On: 10/26/2016 02:14    Procedures Procedures (including critical care time)  Medications Ordered in ED Medications  ibuprofen (ADVIL,MOTRIN) tablet 600 mg (600 mg Oral Given 10/26/16 0144)  cephALEXin (KEFLEX) capsule 500 mg (500 mg Oral Given 10/26/16 0144)     Initial Impression / Assessment and Plan / ED Course  I have reviewed the triage vital signs and the nursing notes.  Pertinent labs & imaging results that were available during my care of the  patient were reviewed by me and considered in my medical decision making (see chart for details).  Clinical Course     Patient presents with complaint of possible wound infection to her left foot as well as a request to check her C-section incision. C-section incision appears clean without discharge, dehiscence, or signs of infection. Foot laceration shows signs of wound infection with surrounding cellulitis. No signs of deeper infection or abscess on x-ray. No noted fluid collection needing to be drained. Patient is nontoxic appearing with no signs of sepsis. Sutures to remain in place for the time being. Antibiotic therapy started. The patient was given instructions for home care as well as strict return precautions. Patient voices understanding of these instructions, accepts the plan, and is comfortable with discharge.   Vitals:   10/25/16 2350 10/25/16 2353 10/26/16 0144 10/26/16 0320  BP: 113/86  123/78 110/67  Pulse: 91  68 60  Resp: 18  18 16   Temp: 98 F (36.7 C)  98.5 F (36.9 C) 97.2 F (36.2 C)  TempSrc: Oral  Oral Oral  SpO2: 97%  96% 95%  Height:  5\' 2"  (1.575 m)       Final Clinical Impressions(s) / ED Diagnoses   Final diagnoses:  Wound infection    New Prescriptions Discharge Medication List as of 10/26/2016  2:56 AM    START taking these medications   Details  cephALEXin (KEFLEX) 500 MG capsule Take 1 capsule (500 mg total) by mouth 4 (four) times daily., Starting Wed 10/26/2016, Print    !! ibuprofen (ADVIL,MOTRIN) 600 MG tablet Take 1 tablet (600 mg total) by mouth 3 (three) times daily., Starting Wed 10/26/2016, Print     !! - Potential duplicate medications found. Please discuss with provider.     I personally performed the services described in this documentation, which was scribed in my presence. The recorded information has been reviewed and is accurate.    Anselm PancoastShawn C Jocilynn Grade, PA-C 10/27/16 [id number]    Devoria AlbeIva Knapp, MD 10/27/16 [phone]

## 2016-10-27 ENCOUNTER — Ambulatory Visit (HOSPITAL_COMMUNITY): Payer: Self-pay

## 2016-10-31 ENCOUNTER — Ambulatory Visit: Payer: Self-pay | Admitting: *Deleted

## 2016-10-31 VITALS — BP 112/54 | HR 63 | Temp 98.3°F | Wt 140.8 lb

## 2016-10-31 DIAGNOSIS — Z5189 Encounter for other specified aftercare: Secondary | ICD-10-CM
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
## 2016-11-01 ENCOUNTER — Other Ambulatory Visit (HOSPITAL_COMMUNITY): Payer: Self-pay

## 2016-11-01 ENCOUNTER — Encounter: Payer: Self-pay | Admitting: Family Medicine

## 2016-11-22 ENCOUNTER — Ambulatory Visit (INDEPENDENT_AMBULATORY_CARE_PROVIDER_SITE_OTHER): Payer: Self-pay | Admitting: Family

## 2016-11-22 DIAGNOSIS — O09292 Supervision of pregnancy with other poor reproductive or obstetric history, second trimester: Secondary | ICD-10-CM

## 2016-11-22 DIAGNOSIS — O09299 Supervision of pregnancy with other poor reproductive or obstetric history, unspecified trimester: Secondary | ICD-10-CM

## 2016-11-22 NOTE — Progress Notes (Signed)
Subjective:     Cathy Nash is a 35 y.o. female who presents for a postpartum visit. She is six weeks postpartum following a csection due to breech presentation 10/16/2016. I have fully reviewed the prenatal and intrapartum course. The delivery was at 37 gestational weeks. Outcome: . Anesthesia: Epidual. Postpartum course has been uncomplicated.  Pt reports pain in area of past rectocele.  Used pessary in past.  Baby's course has been uncomplicated. Baby is feeding by bottle. Bleeding not at this time. Bowel function is normal. Bladder function is normal. Patient is sexually active. Contraception method is nothing but desires IUD. Postpartum depression screening: positive patient was  referred to our behavioral health clinican.  The following portions of the patient's history were reviewed and updated as appropriate: allergies, current medications, past family history, past medical history, past social history, past surgical history and problem list.  Review of Systems Pertinent items are noted in HPI.   Objective:    BP 135/70   Pulse 62   Wt 132 lb (59.9 kg)   BMI 24.14 kg/m         General:  alert, cooperative and appears stated age   Breasts:  inspection negative, no nipple discharge or bleeding, no masses or nodularity palpable  Lungs: clear to auscultation bilaterally  Heart:  regular rate and rhythm, S1, S2 normal, no murmur, click, rub or gallop  Abdomen: soft, non-tender; bowel sounds normal; no masses,  no organomegaly; incision site without signs of infection and well approximated   Vulva:  normal  Vagina: normal vagina, no discharge, exudate, lesion, or erythema; +rectocele (tender with palpation)  Corpus: normal size, contour, position, consistency, mobility, non-tender  Adnexa:  normal adnexa  Rectal Exam: Not performed.   Assessment:   Postpartum exam Rectocele Postpartum Depression  Plan:    1. Contraception: Desires IUD, will [facility] with [facility]  for IUD 2. [facility] 3.  [facility]  Marlis EdelsonWalidah N Karim, CNM

## 2016-12-09 ENCOUNTER — Ambulatory Visit: Payer: Self-pay | Admitting: Obstetrics & Gynecology

## 2017-01-17 IMAGING — US US OB COMP LESS 14 WK
1 series · 14 of 28 positions shown · non-contrast
Comparison: None of this gestation. Gynecologic ultrasound
06/26/2014.

CLINICAL DATA: Pelvic pain in first trimester pregnancy.
Gestational age by dates 3 weeks 5 days. Beta HCG 105.

EXAM:
OBSTETRIC <14 WK US AND TRANSVAGINAL OB US
TECHNIQUE: Both transabdominal and transvaginal ultrasound examinations were
performed for complete evaluation of the gestation as well as the
maternal uterus, adnexal regions, and pelvic cul-de-sac.
Transvaginal technique was performed to assess early pregnancy.

[Series 1: us ob comp less 14 wk · 0.20mm/px · 14 of 30 slices shown]
[im 2/30]
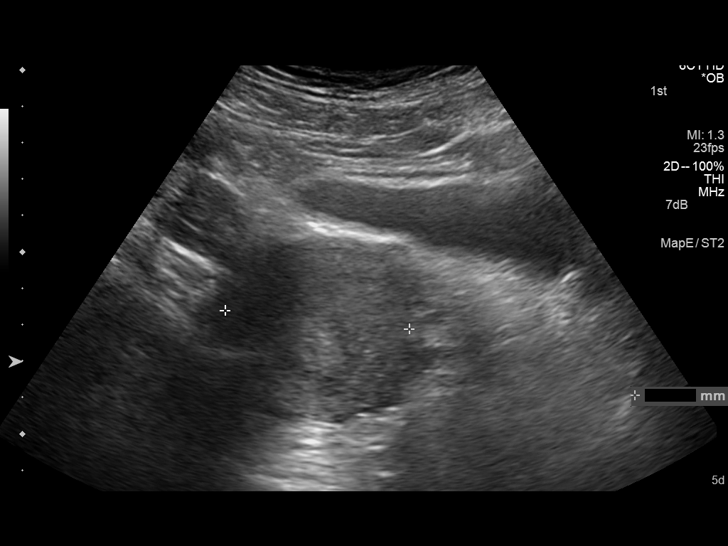
[im 4/30]
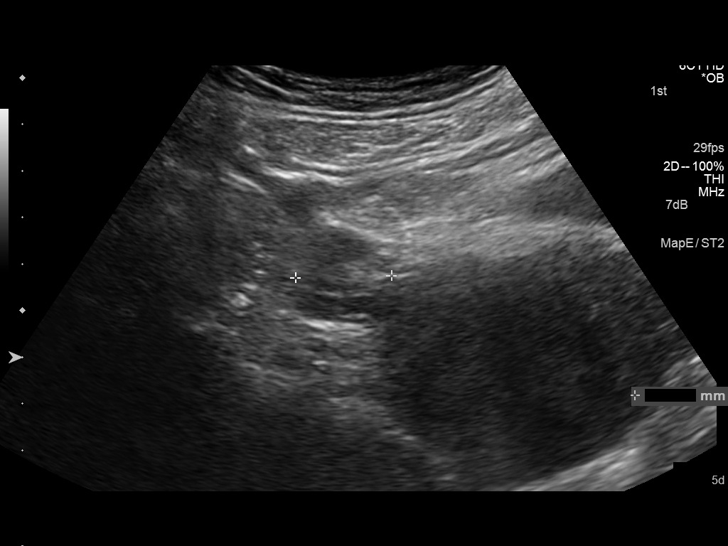
[im 6/30]
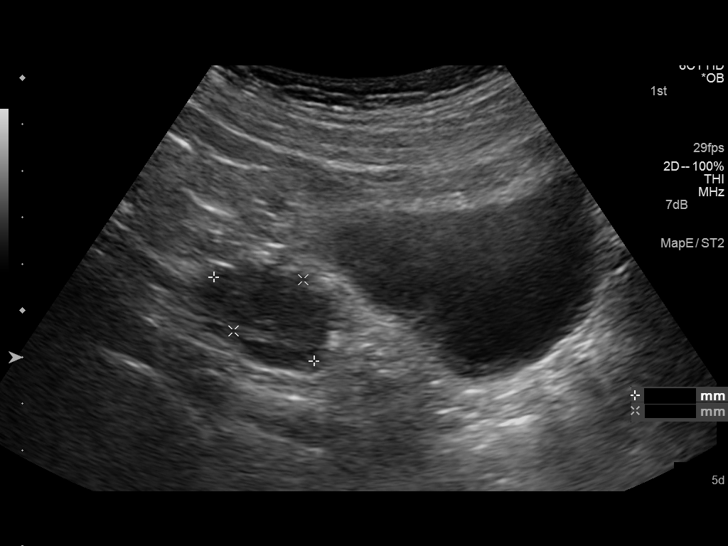
[im 8/30]
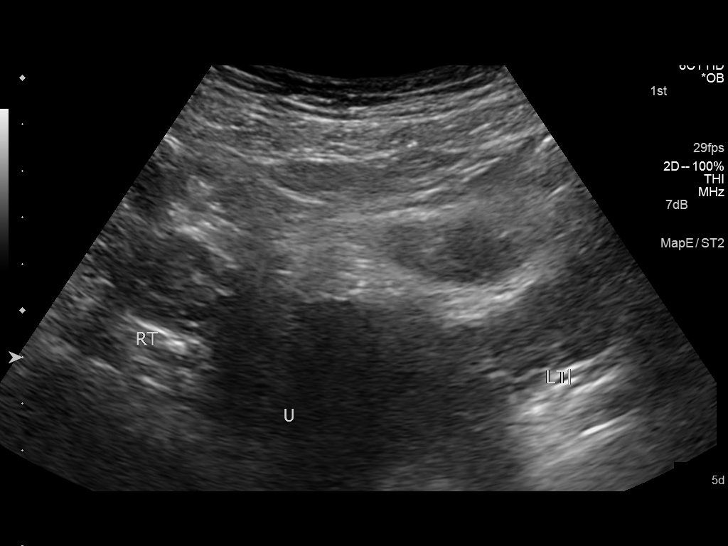
[im 10/30]
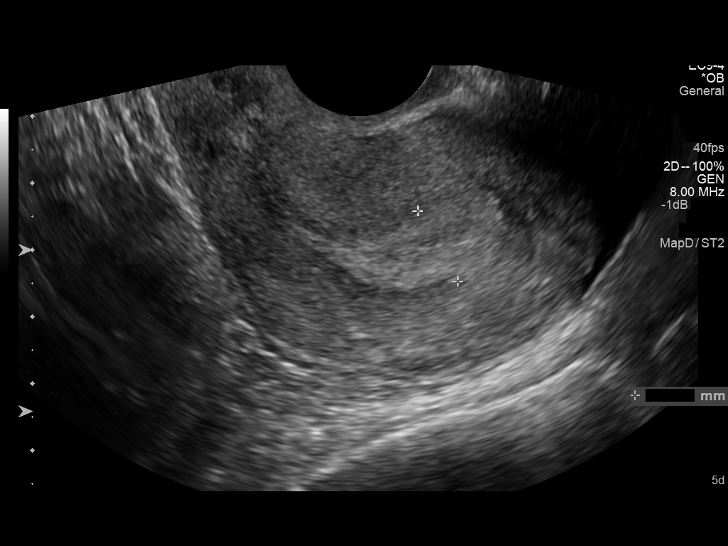
[im 12/30]
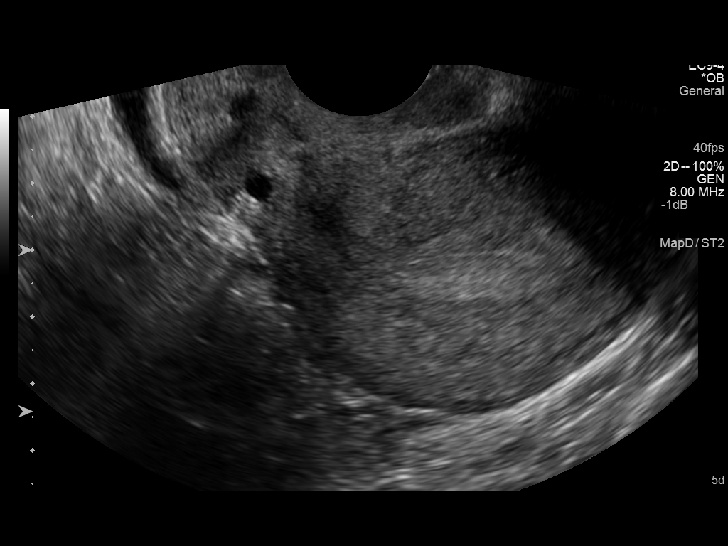
[im 14/30]
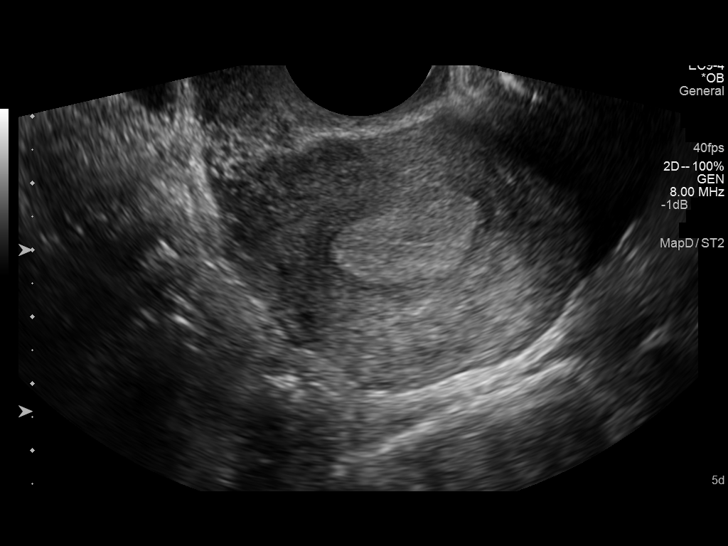
[im 17/30]
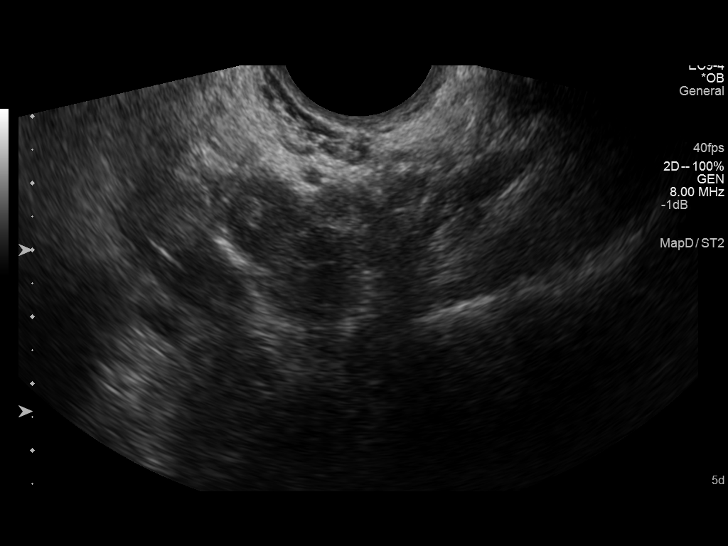
[im 19/30]
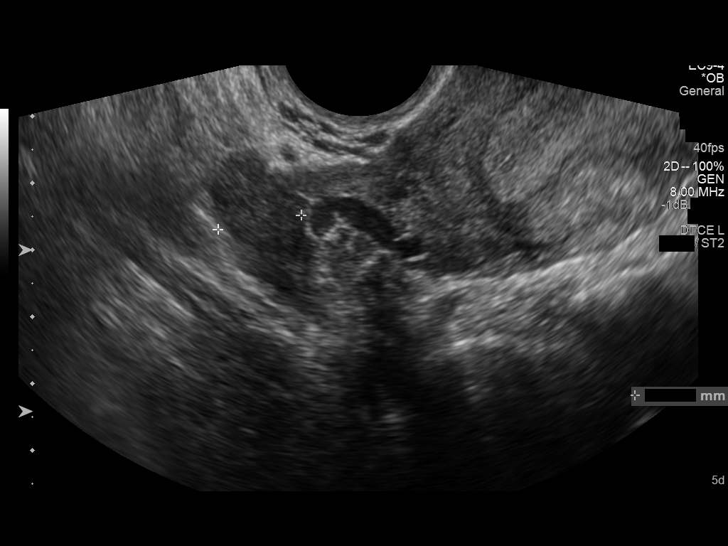
[im 21/30]
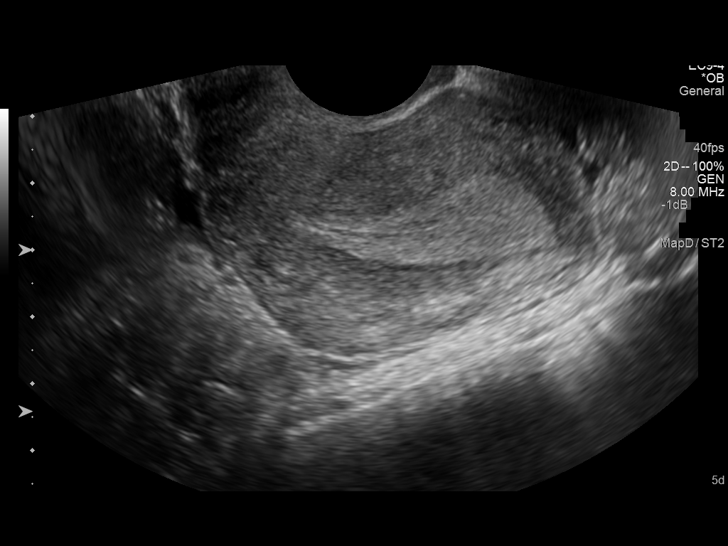
[im 23/30]
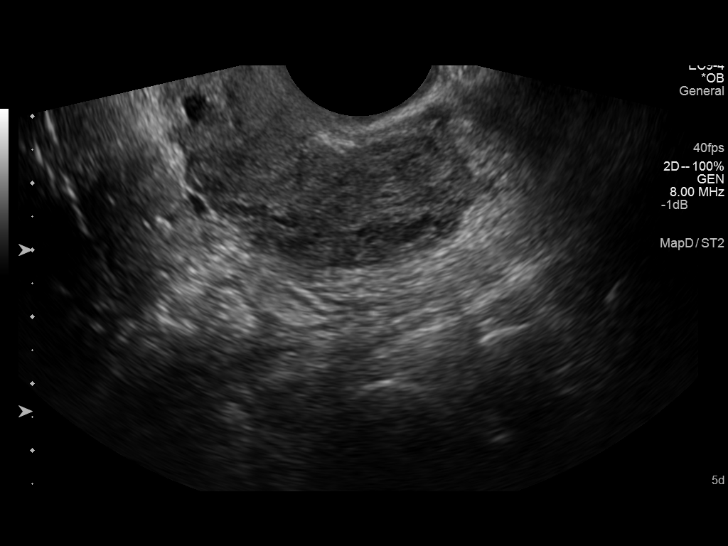
[im 25/30]
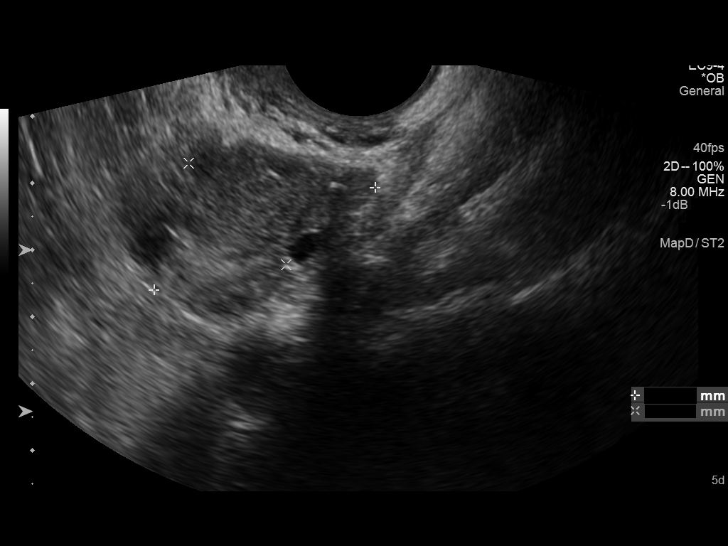
[im 27/30]
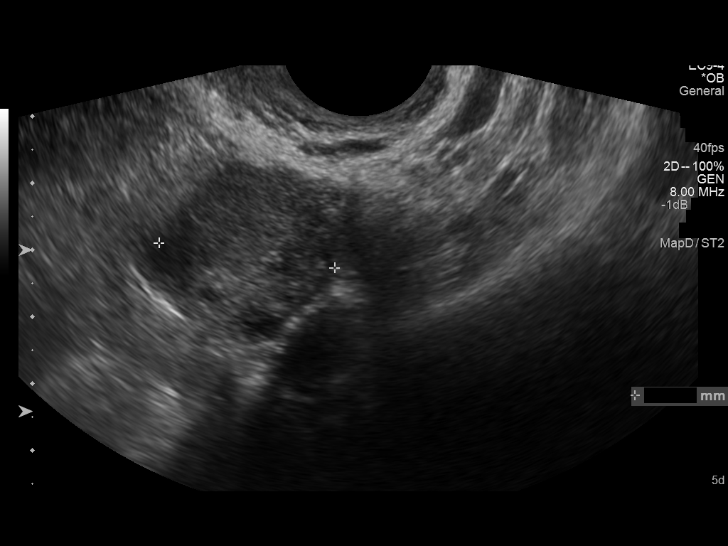
[im 30/30]
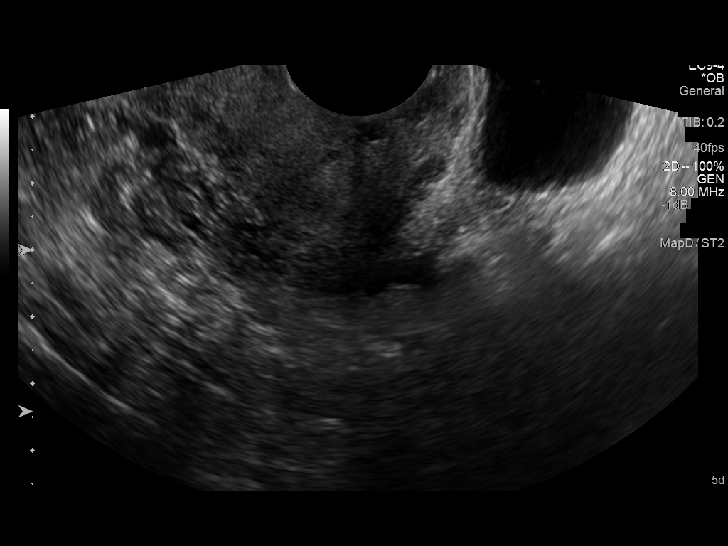

[14 of 28 positions shown; findings below may reference images not displayed]

FINDINGS: No intra or extrauterine gestational sac. There is no ovarian
enlargement or adnexal mass. The corpus luteum is likely present on
the left. No significant or complex pelvic fluid.
IMPRESSION: Pregnancy of unknown location with normal pelvic ultrasound.
Differential considerations include intrauterine gestation too early
to be sonographically visualized, spontaneous abortion, or ectopic
pregnancy. Consider follow-up ultrasound in 10 days and serial
quantitative beta HCG follow-up.

## 2017-01-18 ENCOUNTER — Ambulatory Visit: Payer: Self-pay | Admitting: Obstetrics & Gynecology

## 2017-01-22 IMAGING — US US OB TRANSVAGINAL
1 series · 14 of 23 positions shown · non-contrast
Comparison: 03/19/2015

CLINICAL DATA: Midline lower abdominal pain for 3 days.
Quantitative beta HCG on 03/24/2015 is [DATE]. 03/19/2015,
quantitative beta HCG was 105.

EXAM:
TRANSVAGINAL OB ULTRASOUND
TECHNIQUE: Transvaginal ultrasound was performed for complete evaluation of the
gestation as well as the maternal uterus, adnexal regions, and
pelvic cul-de-sac.

[Series 1: us ob transvaginal · 14 of 23 slices shown]
[im 1/23]
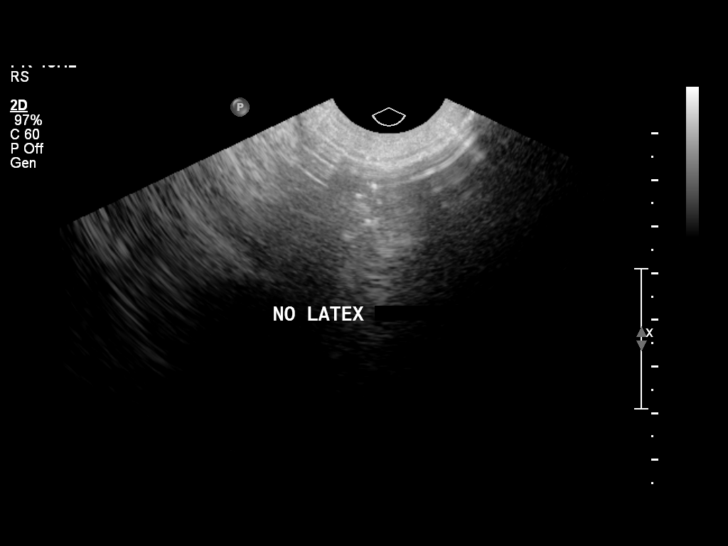
[im 3/23]
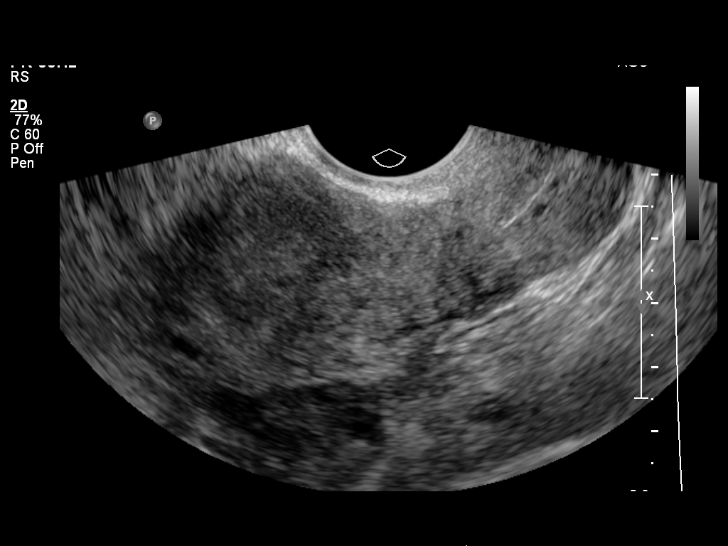
[im 5/23]
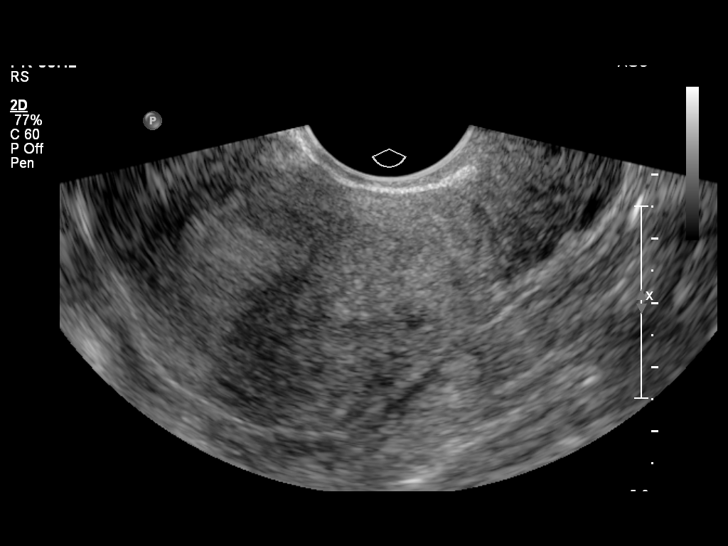
[im 6/23]
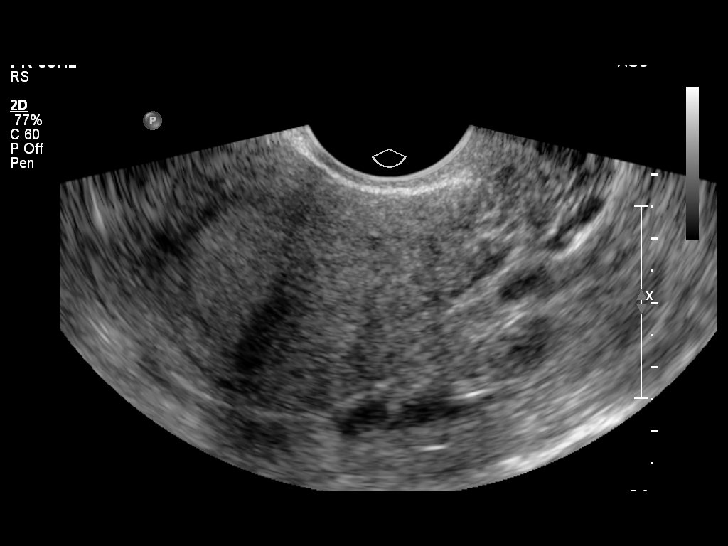
[im 8/23]
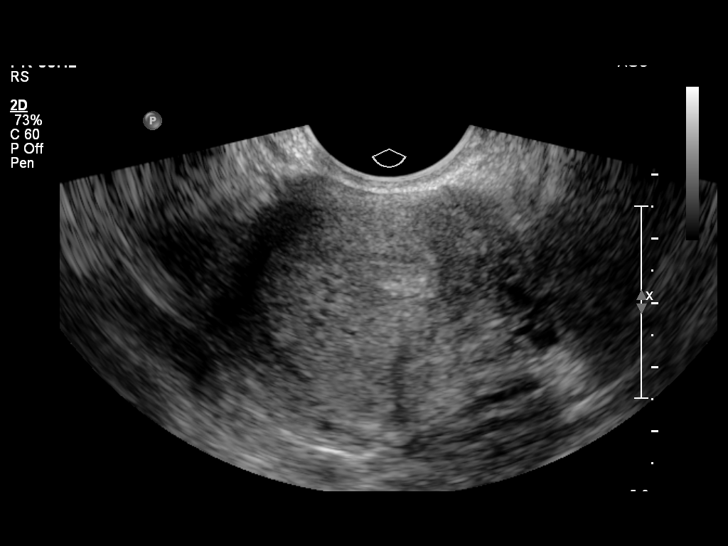
[im 10/23]
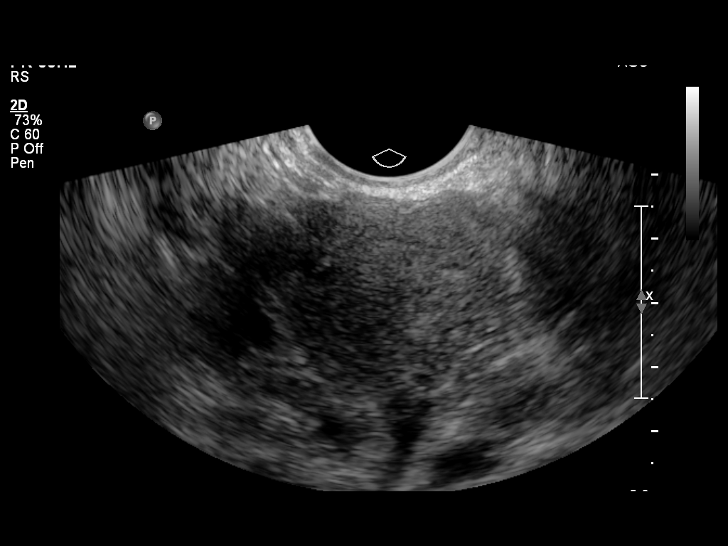
[im 11/23]
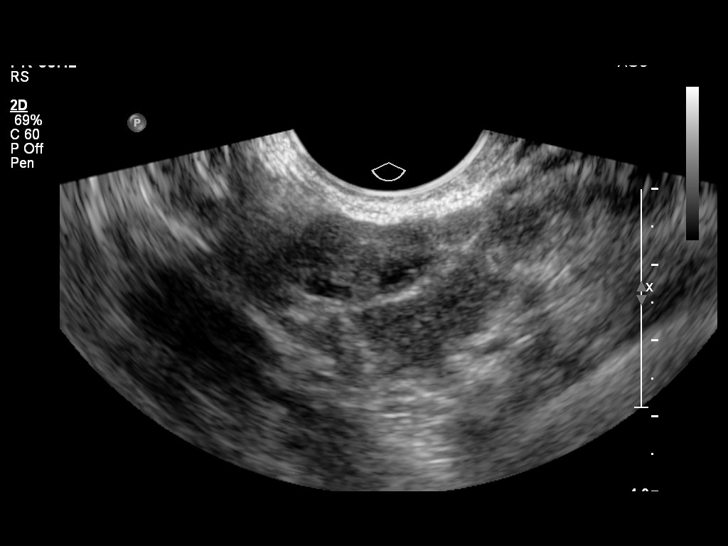
[im 13/23]
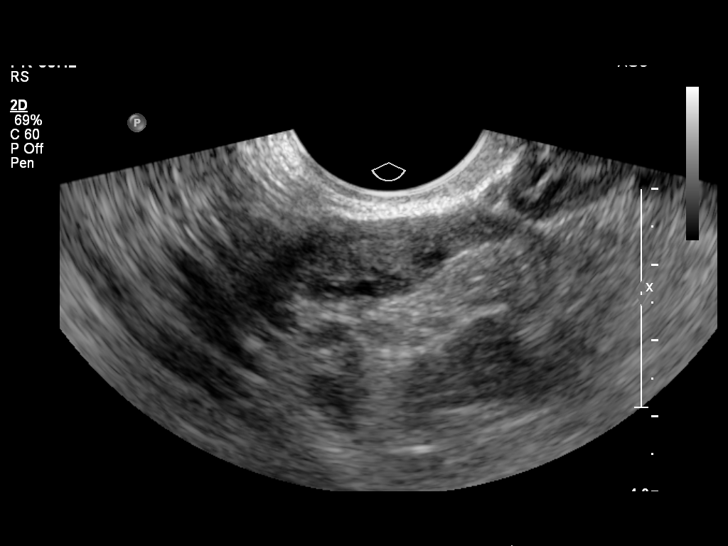
[im 14/23]
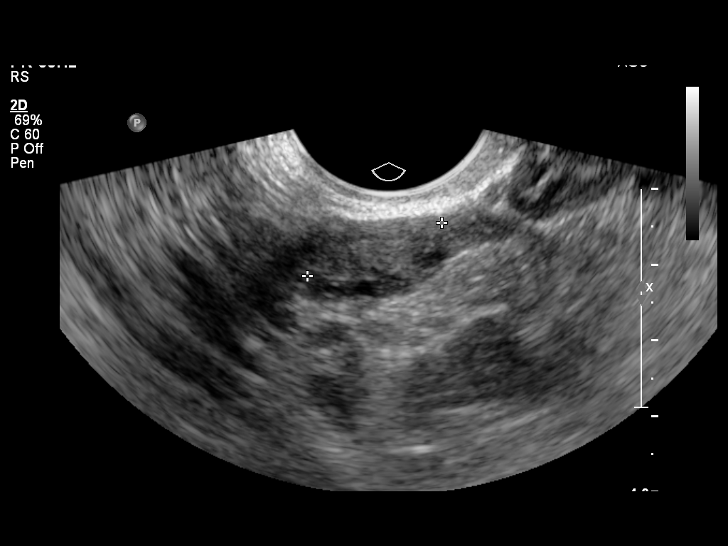
[im 16/23]
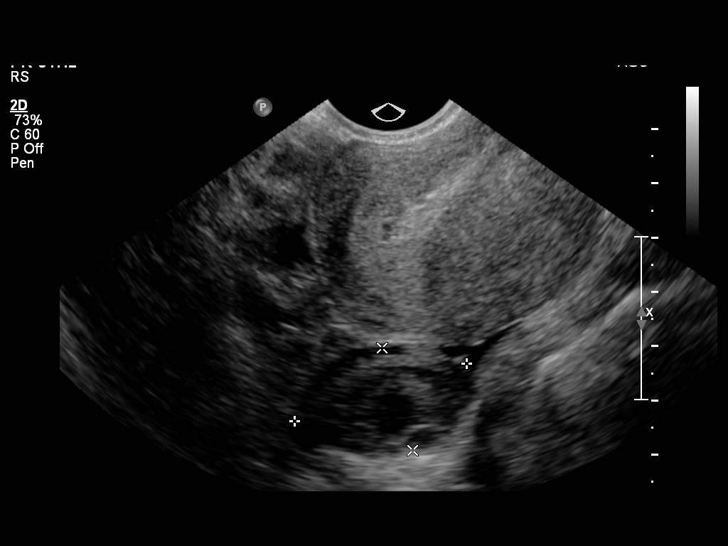
[im 18/23]
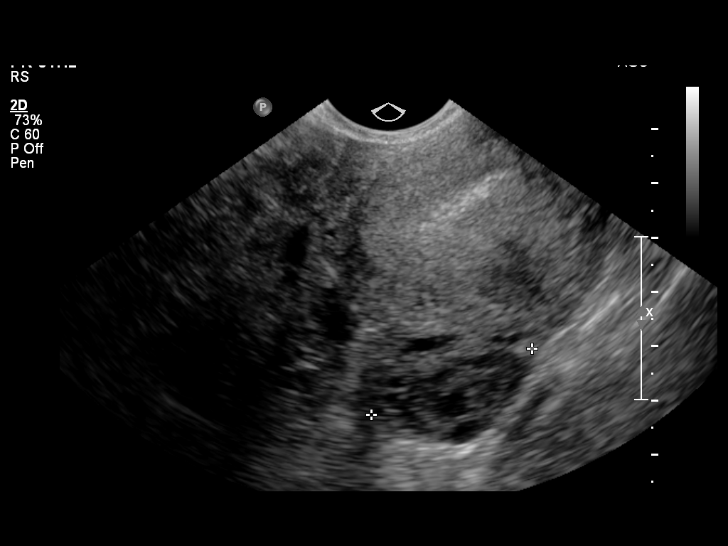
[im 19/23]
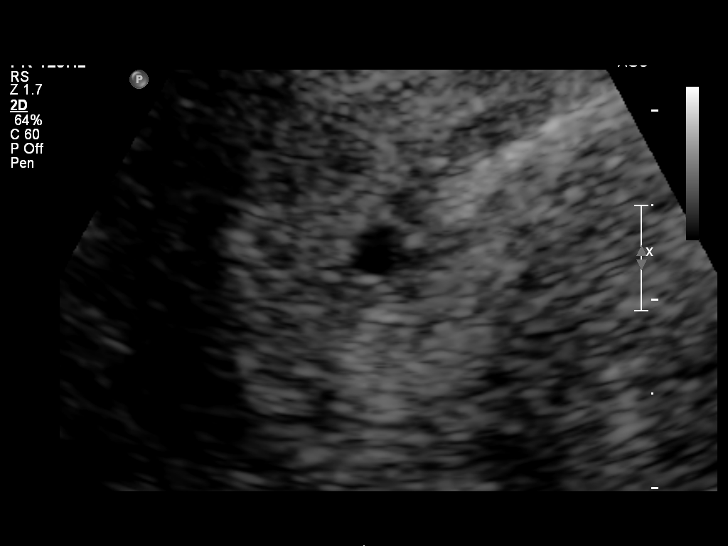
[im 21/23]
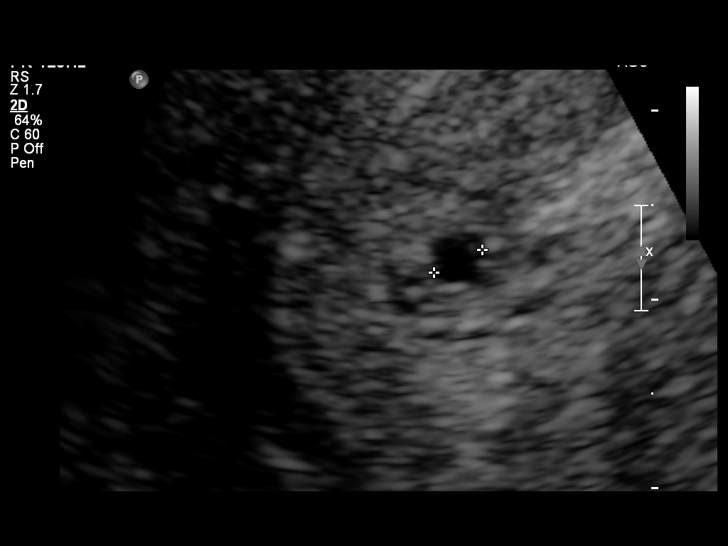
[im 23/23]
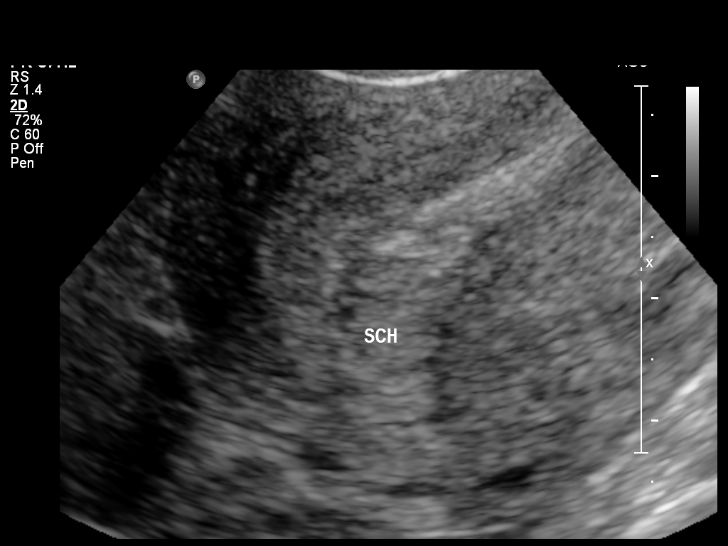

[14 of 23 positions shown; findings below may reference images not displayed]

FINDINGS: Intrauterine gestational sac: Present

Yolk sac:  Not seen

Embryo:  Not seen

Cardiac Activity: Not seen

MSD: 2.5  mm   4 w   5  d

Maternal uterus/adnexae: Small subchorionic hemorrhage identified.
The ovaries have a normal appearance. No free pelvic fluid.
IMPRESSION: 1. Interval development of a probable intrauterine gestational sac.
The yolk sac and embryo are not yet visible.
2. Follow-up ultrasound is recommended in 14 days to document
presence of fetal pole and for dating purposes.
3. Small subchorionic hemorrhage.

## 2017-04-18 ENCOUNTER — Encounter: Payer: Self-pay | Admitting: Obstetrics & Gynecology

## 2017-04-20 ENCOUNTER — Encounter: Payer: Self-pay | Admitting: Obstetrics & Gynecology

## 2017-06-18 ENCOUNTER — Encounter (HOSPITAL_COMMUNITY): Payer: Self-pay | Admitting: Emergency Medicine

## 2017-06-18 ENCOUNTER — Emergency Department (HOSPITAL_COMMUNITY)
Admission: EM | Admit: 2017-06-18 | Discharge: 2017-06-18 | Disposition: A | Discharge status: Home / Self Care | Payer: Medicaid Other | Attending: Emergency Medicine | Admitting: Emergency Medicine

## 2017-06-18 DIAGNOSIS — J45909 Unspecified asthma, uncomplicated: Secondary | ICD-10-CM | POA: Insufficient documentation

## 2017-06-18 DIAGNOSIS — Z87891 Personal history of nicotine dependence: Secondary | ICD-10-CM | POA: Insufficient documentation

## 2017-06-18 DIAGNOSIS — K644 Residual hemorrhoidal skin tags: Secondary | ICD-10-CM

## 2017-06-18 DIAGNOSIS — K645 Perianal venous thrombosis: Secondary | ICD-10-CM | POA: Insufficient documentation

## 2017-06-18 DIAGNOSIS — K5900 Constipation, unspecified: Secondary | ICD-10-CM | POA: Insufficient documentation

## 2017-06-18 HISTORY — DX: Unspecified hemorrhoids: K64.9

## 2017-06-18 LAB — CBC WITH DIFFERENTIAL/PLATELET
Basophils Absolute: 0 10*3/uL (ref 0.0–0.1)
Basophils Relative: 0 %
Eosinophils Absolute: 0.2 10*3/uL (ref 0.0–0.7)
Eosinophils Relative: 3 %
HEMATOCRIT: 36.9 % (ref 36.0–46.0)
HEMOGLOBIN: 12.3 g/dL (ref 12.0–15.0)
LYMPHS ABS: 2.5 10*3/uL (ref 0.7–4.0)
Lymphocytes Relative: 33 %
MCH: 29.1 pg (ref 26.0–34.0)
MCHC: 33.3 g/dL (ref 30.0–36.0)
MCV: 87.2 fL (ref 78.0–100.0)
Monocytes Absolute: 0.5 10*3/uL (ref 0.1–1.0)
Monocytes Relative: 7 %
NEUTROS ABS: 4.3 10*3/uL (ref 1.7–7.7)
NEUTROS PCT: 57 %
Platelets: 314 10*3/uL (ref 150–400)
RBC: 4.23 MIL/uL (ref 3.87–5.11)
RDW: 13.8 % (ref 11.5–15.5)
WBC: 7.6 10*3/uL (ref 4.0–10.5)

## 2017-06-18 LAB — BASIC METABOLIC PANEL
Anion gap: 8 (ref 5–15)
BUN: 14 mg/dL (ref 6–20)
CHLORIDE: 104 mmol/L (ref 101–111)
CO2: 23 mmol/L (ref 22–32)
Calcium: 9.2 mg/dL (ref 8.9–10.3)
Creatinine, Ser: 0.56 mg/dL (ref 0.44–1.00)
GFR calc Af Amer: >60 mL/min (ref >60)
GFR calc non Af Amer: >60 mL/min (ref >60)
Glucose, Bld: 110 mg/dL — ABNORMAL HIGH (ref 65–99)
Potassium: 3.6 mmol/L (ref 3.5–5.1)
Sodium: 135 mmol/L (ref 135–145)

## 2017-06-18 LAB — I-STAT BETA HCG BLOOD, ED (MC, WL, AP ONLY): I-stat hCG, quantitative: <5 m[IU]/mL (ref <5)

## 2017-06-18 LAB — URINALYSIS, ROUTINE W REFLEX MICROSCOPIC
BILIRUBIN URINE: NEGATIVE
Bacteria, UA: NONE SEEN
GLUCOSE, UA: NEGATIVE mg/dL
KETONES UR: NEGATIVE mg/dL
LEUKOCYTES UA: NEGATIVE
Nitrite: NEGATIVE
PROTEIN: NEGATIVE mg/dL
Specific Gravity, Urine: 1.012 (ref 1.005–1.030)
pH: 5 (ref 5.0–8.0)

## 2017-06-18 MED ORDER — OXYCODONE-ACETAMINOPHEN 5-325 MG PO TABS
ORAL_TABLET | ORAL | Status: AC
  Filled 2017-06-18: qty 1

## 2017-06-18 MED ORDER — LIDOCAINE HCL 2 % EX GEL
1.0000 "application " | Freq: Once | CUTANEOUS | Status: AC
  Administered 2017-06-18: 1 via TOPICAL
  Filled 2017-06-18: qty 20

## 2017-06-18 MED ORDER — LIDOCAINE (ANORECTAL) 5 % EX GEL: CUTANEOUS | 0 refills | Status: DC

## 2017-06-18 MED ORDER — LIDOCAINE-EPINEPHRINE (PF) 2 %-1:200000 IJ SOLN
10.0000 mL | Freq: Once | INTRAMUSCULAR | Status: AC
  Administered 2017-06-18: 10 mL
  Filled 2017-06-18: qty 20

## 2017-06-18 MED ORDER — OXYCODONE-ACETAMINOPHEN 5-325 MG PO TABS
1.0000 | ORAL_TABLET | Freq: Once | ORAL | Status: AC
  Administered 2017-06-18: 1 via ORAL

## 2017-06-18 NOTE — Discharge Instructions (Addendum)
Please keep the area clean with warm soap and water. You can also take Sitz baths. You make take ibuprofen or Tylenol as needed for pain. Please do not strain when you could've the bathroom to have a bowel movement this can increase the chance of recurrence. Please try to eat a lot of fiber in your diet and drink plenty of water to help bulk up your stools to avoid straining. You can also try MiraLAX, which is available over-the-counter, to help soften your stools.  If he develop new or worsening symptoms including fever, chills, worsening redness, warmth, or swelling to the area, please return to the emergency department for evaluation. I have also provided a referral to [facility] and [facility] for follow-up. They conservative measure primary care provider.

## 2017-06-18 NOTE — ED Triage Notes (Signed)
Patient reports hemorrhoid pain with mild bleeding onset this week .

## 2017-06-18 NOTE — ED Provider Notes (Signed)
[facility]-EMERGENCY DEPT Provider Note   CSN: [id number] Arrival date & time: 06/18/17  0211     History   Chief Complaint Chief Complaint  Patient presents with  . Hemorrhoids    HPI Cathy Nash is a 36 y.o. female with a h/o of external hemorrhoids who presents to the emergency department with a chief complaint of rectal bleeding x3 that began 3 days ago. She reports a h/o of similar symptoms related to external hemorrhoids, which previously resolved spontaneously. She states that she has not noticed the the toilet bowl turning red from blood. She has treated her current symptoms at home with three different OTC creams without relief.   The history is provided by the patient. No language interpreter was used.    Past Medical History:  Diagnosis Date  . Anxiety   . Asthma   . Hemorrhoid   . Pregnancy induced hypertension    previous pregnancy  . Preterm labor    1st pregnancy d/t pre-e  . Pyelonephritis    June 2013  . Vaginal pessary in situ   . Varicose veins     Patient Active Problem List   Diagnosis Date Noted  . History of preterm premature rupture of membranes (PROM) in previous pregnancy 07/11/2016  . History of pre-eclampsia  07/11/2016  . Language barrier 07/11/2016  . Asthma affecting pregnancy, antepartum 06/01/2015  . Rectocele, grade 3 03/05/2015    Past Surgical History:  Procedure Laterality Date  . CESAREAN SECTION N/A 10/16/2016   Procedure: CESAREAN SECTION;  Surgeon: [place] Bing, MD;  Location: [facility];  Service: Obstetrics;  Laterality: N/A;  . NO PAST SURGERIES      OB History    Gravida Para Term Preterm AB Living   5 5 3 2  0 5   SAB TAB Ectopic Multiple Live Births   0 0 0 0 5       Home Medications    Prior to Admission medications   Medication Sig Start Date End Date Taking? Authorizing Provider  acetaminophen (TYLENOL) 325 MG tablet Take 650 mg by mouth every 6 (six) hours as needed for mild pain, moderate  pain or headache.    [provider]  ibuprofen (ADVIL,MOTRIN) 600 MG tablet Take 1 tablet (600 mg total) by mouth every 6 (six) hours. Patient not taking: Reported on 11/22/2016 10/19/16   Youlanda Mighty D, MD  ibuprofen (ADVIL,MOTRIN) 600 MG tablet Take 1 tablet (600 mg total) by mouth 3 (three) times daily. Patient not taking: Reported on 11/22/2016 10/26/16   Harolyn Rutherford C, PA-C  Lidocaine, Anorectal, 5 % GEL Apply to affected area up to 4 times daily as needed 06/18/17   Shakoya Gilmore A, PA-C  oxyCODONE (OXY IR/ROXICODONE) 5 MG immediate release tablet Take 1 tablet (5 mg total) by mouth every 4 (four) hours as needed (pain scale 4-7). Patient not taking: Reported on 11/22/2016 10/19/16   Tillman Sers, DO    Family History Family History  Problem Relation Age of Onset  . Diabetes Mother   . Asthma Father   . Anesthesia problems Neg Hx   . Hypotension Neg Hx   . Malignant hyperthermia Neg Hx   . Pseudochol deficiency Neg Hx   . Other Neg Hx     Social History Social History  Substance Use Topics  . Smoking status: Former Games developer  . Smokeless tobacco: Never Used     Comment: yrs ago  . Alcohol use No     Allergies  Iohexol and Latex   Review of Systems Review of Systems  Constitutional: Negative for activity change, chills and fever.  Respiratory: Negative for cough and shortness of breath.   Cardiovascular: Negative for chest pain and leg swelling.  Gastrointestinal: Positive for anal bleeding, blood in stool, constipation and rectal pain. Negative for abdominal distention, abdominal pain, diarrhea, nausea and vomiting.  Genitourinary: Negative for dysuria, vaginal discharge and vaginal pain.  Musculoskeletal: Negative for back pain.  Skin: Negative for rash.  Allergic/Immunologic: Negative for immunocompromised state.  Neurological: Negative for light-headedness and headaches.   Physical Exam Updated Vital Signs BP (!) 108/58   Pulse 63   Temp 97.9 F  (36.6 C) (Oral)   Resp 16   LMP 06/07/2017 (Approximate)   SpO2 100%   Physical Exam  Constitutional: No distress.  HENT:  Head: Normocephalic.  Eyes: Conjunctivae are normal.  Neck: Neck supple.  Cardiovascular: Normal rate, regular rhythm, normal heart sounds and intact distal pulses.  Exam reveals no gallop and no friction rub.   No murmur heard. Pulmonary/Chest: Effort normal. No respiratory distress.  Abdominal: Soft. She exhibits no distension.  Genitourinary:  Genitourinary Comments: Chaperoned exam. There is a 3 by 1 cm thrombosed external hemorrhoid to the perianal area that is exquisitely tender to palpation. No warmth or redness.   Neurological: She is alert.  Skin: Skin is warm. No rash noted.  Psychiatric: Her behavior is normal.  Nursing note and vitals reviewed.  ED Treatments / Results  Labs (all labs ordered are listed, but only abnormal results are displayed) Labs Reviewed  BASIC METABOLIC PANEL - Abnormal; Notable for the following:       Result Value   Glucose, Bld 110 (*)    All other components within normal limits  URINALYSIS, ROUTINE W REFLEX MICROSCOPIC - Abnormal; Notable for the following:    Color, Urine STRAW (*)    Hgb urine dipstick MODERATE (*)    Squamous Epithelial / LPF 0-5 (*)    All other components within normal limits  CBC WITH DIFFERENTIAL/PLATELET  I-STAT BETA HCG BLOOD, ED ([facility], [facility], [facility] ONLY)    EKG  EKG Interpretation None       Radiology No results found.  Procedures Procedures (including critical care time)  Medications Ordered in ED Medications  oxyCODONE-acetaminophen (PERCOCET/ROXICET) 5-325 MG per tablet 1 tablet (1 tablet Oral Given 06/18/17 0235)  lidocaine-EPINEPHrine (XYLOCAINE W/EPI) 2 %-1:200000 (PF) injection 10 mL (10 mLs Infiltration Given by Other 06/18/17 0736)  lidocaine (XYLOCAINE) 2 % jelly 1 application (1 application Topical Given 06/18/17 0853)     Initial Impression / Assessment and Plan / ED  Course  I have reviewed the triage vital signs and the nursing notes.  Pertinent labs & imaging results that were available during my care of the patient were reviewed by me and considered in my medical decision making (see chart for details).     Patient with external thrombosed hemorrhoid. No h/o of inflammatory bowel disease or DM. Hemorrhoidectomy performed in the ED with 2% lidocaine. The patient initially tolerated the procedure without difficulty, but the complained of pain. Pain managed with lidocaine jelly, which the patient will also be d/ced with a Rx for. VSS. NAD. The patient is stable for d/c at this time with a referral for PCP follow up.   Final Clinical Impressions(s) / ED Diagnoses   Final diagnoses:  External hemorrhoids    New Prescriptions Discharge Medication List as of 06/18/2017  8:25 AM  START taking these medications   Details  Lidocaine, Anorectal, 5 % GEL Apply to affected area up to 4 times daily as needed, Print         Garvis Downum A, PA-C 06/20/17 0051

## 2017-06-18 NOTE — ED Provider Notes (Signed)
Medical screening examination/treatment/procedure(s) were conducted as a shared visit with non-physician practitioner(s) and myself.  I personally evaluated the patient during the encounter. Briefly, the patient is a 36 y.o. female who presents with pain from a thrombosed external hemorrhoid. I&D was performed by Frederik PearMia McDonald, PA. Patient provided with topical anesthetic. We'll discharge with lidocaine ointment. Safe for discharge with strict return precautions.    Nira Connardama, Pedro Eduardo, MD 06/18/17 [phone]

## 2017-08-05 IMAGING — US US MFM OB FOLLOW-UP
1 series · 14 of 28 positions shown · non-contrast
Comparison: none

[Series 1: us mfm ob follow-up · 14 of 68 slices shown]
[im 3/68]
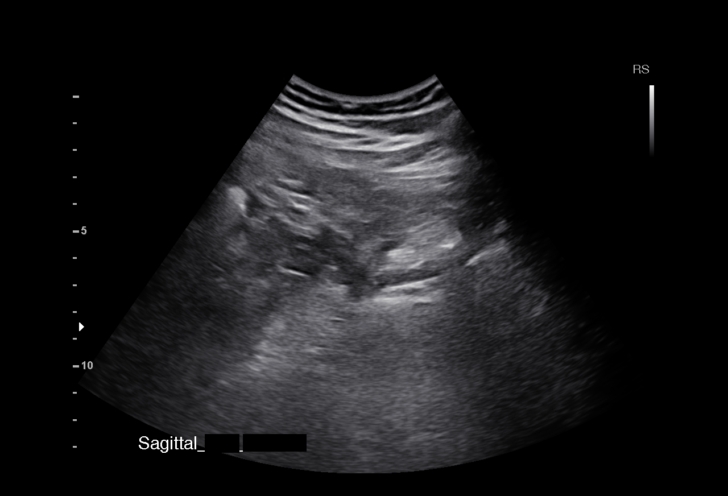
[im 8/68]
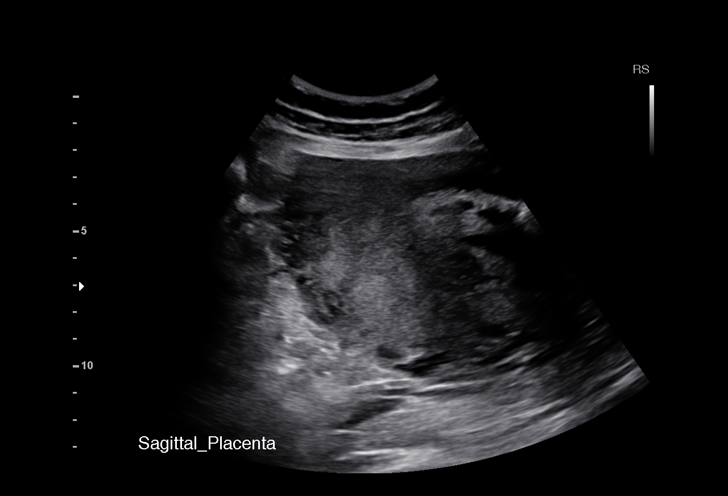
[im 13/68]
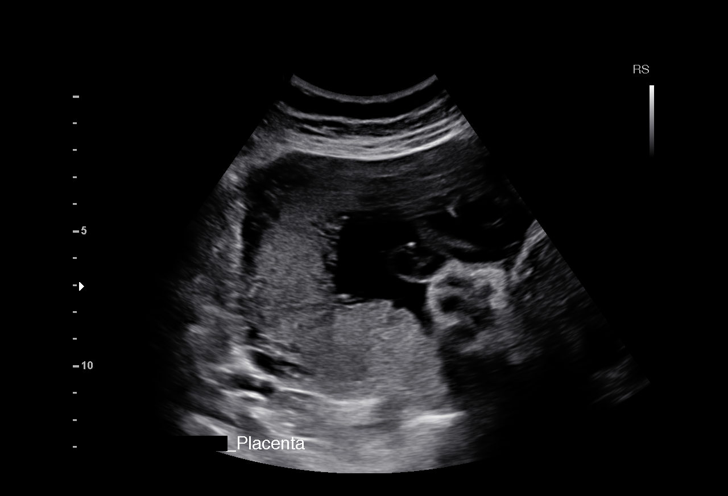
[im 18/68]
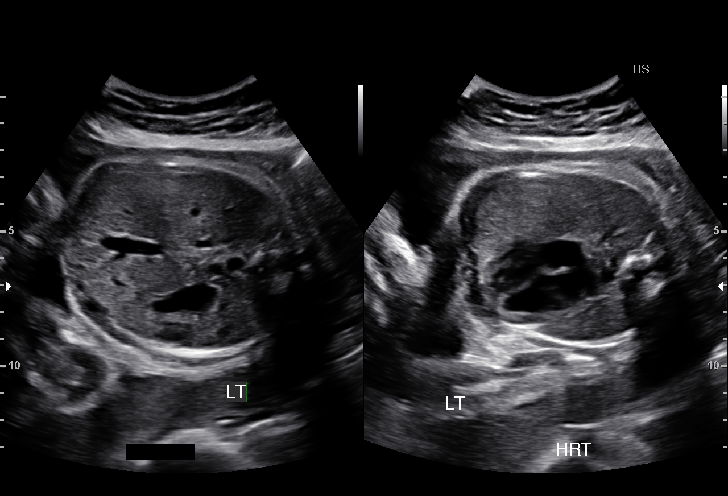
[im 23/68]
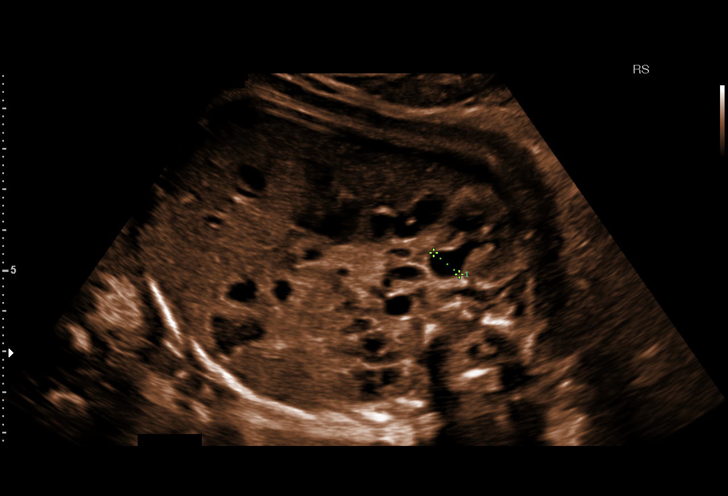
[im 28/68]
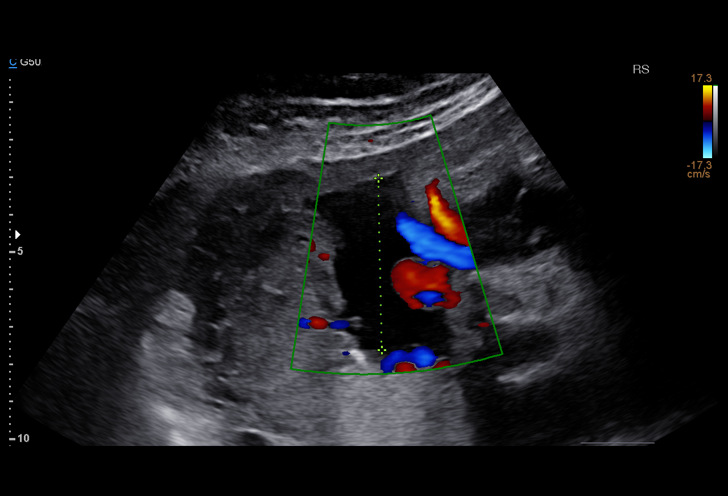
[im 33/68]
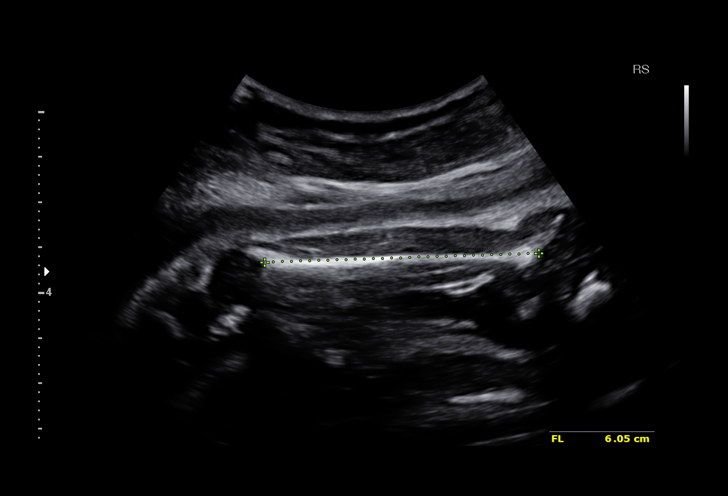
[im 38/68]
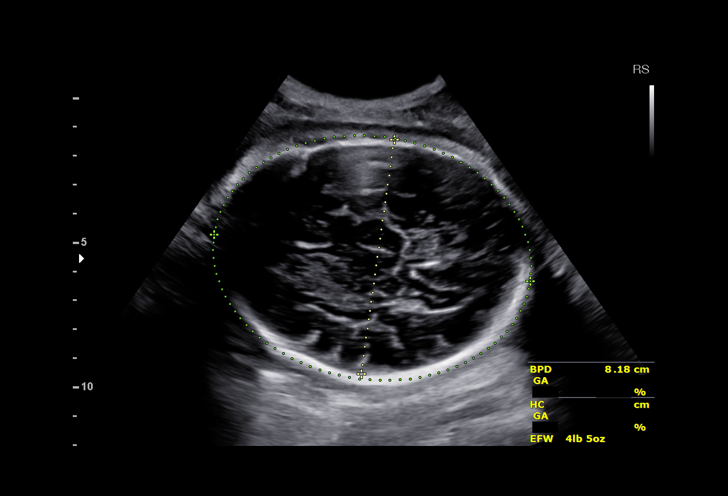
[im 43/68]
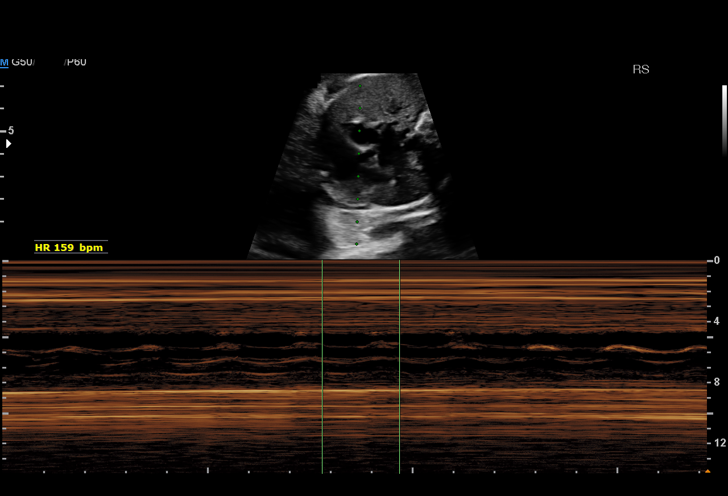
[im 48/68]
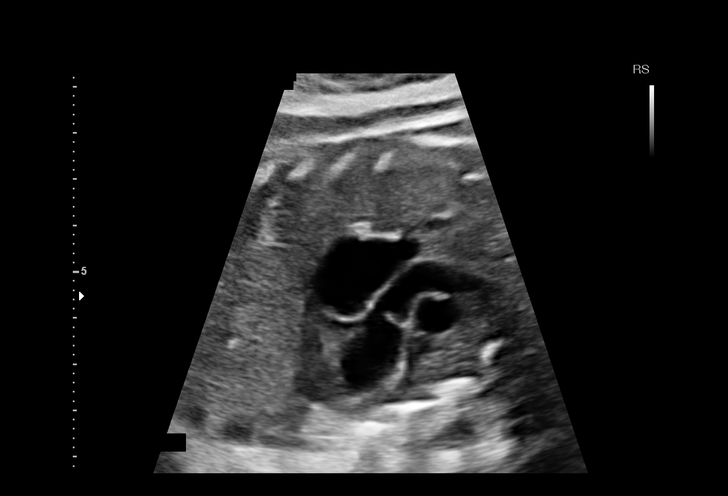
[im 53/68]
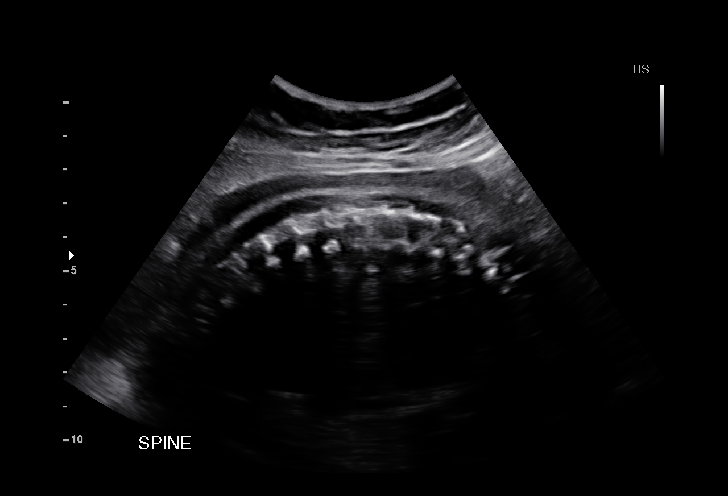
[im 58/68]
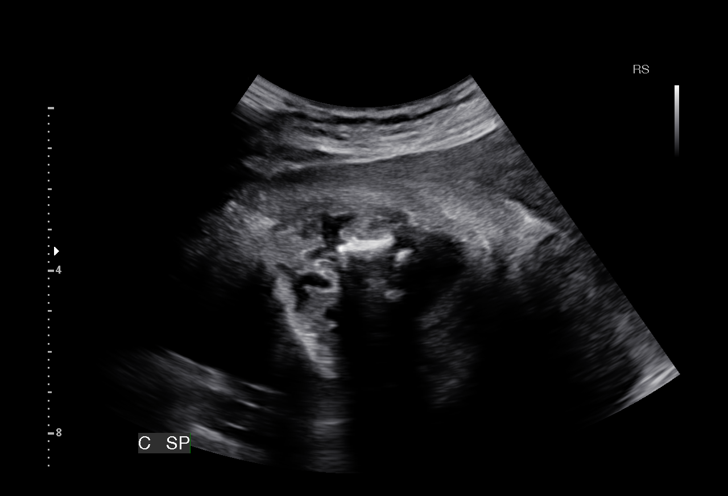
[im 63/68]
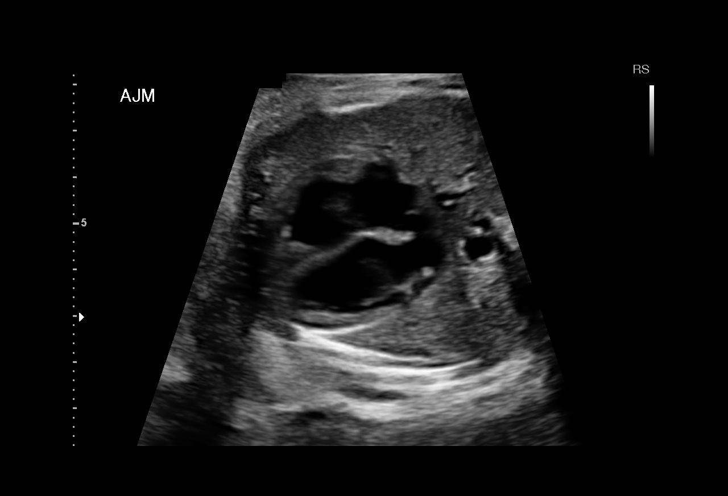
[im 68/68]
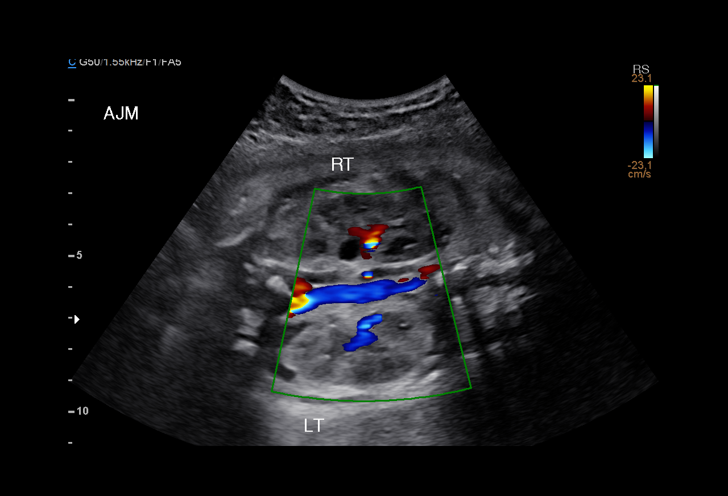

[14 of 28 positions shown; findings below may reference images not displayed]

OBSTETRICS REPORT
(Signed Final 10/06/2015 [DATE])

Name:       EDWIN JORGE FIORELLA                  Visit  10/05/2015 [DATE]
Date:

Service(s) Provided

Indications

32 weeks gestation of pregnancy
Follow-up incomplete fetal anatomic evaluation         Z36
History of pre-term delivery due to pre-eclampsia
Fetal Evaluation

Num Of             1
Fetuses:
Fetal Heart        159                          bpm
Rate:
Cardiac Activity:  Observed
Presentation:      Cephalic
Placenta:          Posterior, above cervical
os
P. Cord            Previously Visualized
Insertion:

Amniotic Fluid
AFI FV:      Subjectively within normal limits
AFI Sum:     13.2     cm      41  %Tile     Larg Pckt:    4.85   cm
RUQ:   4.59    cm    RLQ:   4.85    cm   LUQ:    1.56    cm   LLQ:    2.2    cm
Biometry

BPD:     82.2   m    G. Age:   33w 1d                 CI:        70.74   70 - 86
m
FL/HC:      19.6   19.9 -
21.5
HC:     311.5   m    G. Age:   34w 6d        73  %    HC/AC:      1.11   0.96 -
m
AC:     279.6   m    G. Age:   32w 0d        34  %    FL/BPD      74.3   71 - 87
m                                     :
FL:      61.1   m    G. Age:   31w 5d        19  %    FL/AC:      21.9   20 - 24
m
HUM:     50.4   m    G. Age:   29w 4d       < 5  %
m
Est.        2669   gm    4 lb 5 oz      52   %
FW:
Gestational Age

LMP:           32w 4d        Date:  02/19/15                  EDD:   11/26/15
U/S Today:     32w 6d                                         EDD:   11/24/15
Best:          32w 4d    Det. By:   LMP  (02/19/15)           EDD:   11/26/15
Anatomy

Cranium:          Previously seen        Aortic Arch:       Previously seen
Fetal Cavum:      Previously seen        Ductal Arch:       Previously seen
Ventricles:       Appears normal         Diaphragm:         Previously seen
Choroid Plexus:   Previously seen        Stomach:           Appears normal,
left sided
Cerebellum:       Previously seen        Abdomen:           Previously seen
Posterior         Previously seen        Abdominal          Not well visualized
Fossa:                                   Wall:
Nuchal Fold:      Previously seen        Cord Vessels:      Previously seen
Face:             Orbits and profile     Kidneys:           Right sided
previously seen                           pyelectasis,
mm
Lips:             Previously seen        Bladder:           Appears normal
Heart:            Appears normal         Spine:             Limited views
(4CH, axis, and                           appear normal
situs)
RVOT:             Previously seen        Lower              Appears normal
Extremities:
LVOT:             Appears normal         Upper              Appears normal
Extremities:

Other:   Fetus appears to be a male. Technically difficult due to advanced
GA and fetal position.
Targeted Anatomy

Fetal Central Nervous System
Lat. Ventricles:
Cervix Uterus Adnexa

Cervix:       Not visualized (advanced GA >90wks)
Uterus:       No abnormality visualized.

Left Ovary:    Not visualized.
Right Ovary:   Not visualized.

Adnexa:     No abnormality visualized. No adnexal mass visualized.
Impression

SIUP at 32+4 weeks
Normal interval anatomy; limited views of CI; right UTD A1
(mild pyelectasis)
Normal amniotic fluid volume
Appropriate interval growth with EFW at the 52nd %tile
Recommendations

Inform newborn providers of the pyelectasis
Otherwise normal prenatal care and delivery

## 2018-06-13 IMAGING — US US MFM OB TRANSVAGINAL
1 series · 15 of 28 positions shown · non-contrast
Comparison: none

[Series 1: us mfm ob transvaginal · 41 acquisitions, 15 frames shown]
[im 1/41]
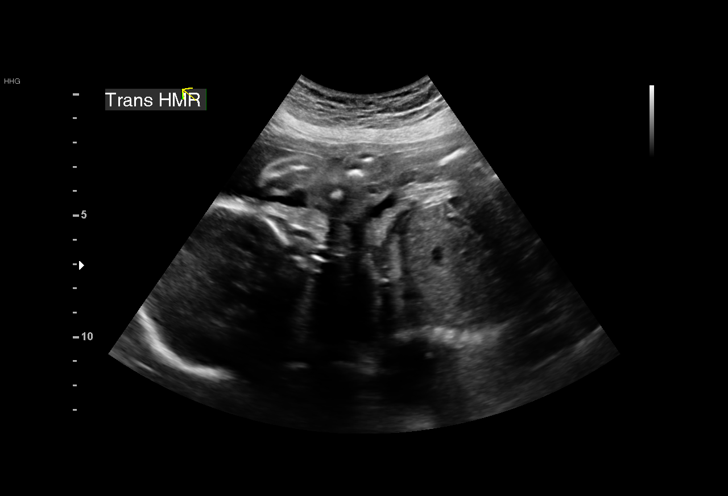
[im 3/41]
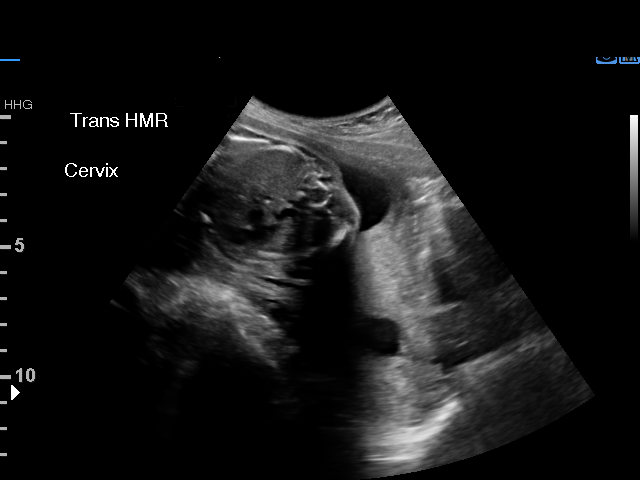
[im 6/41]
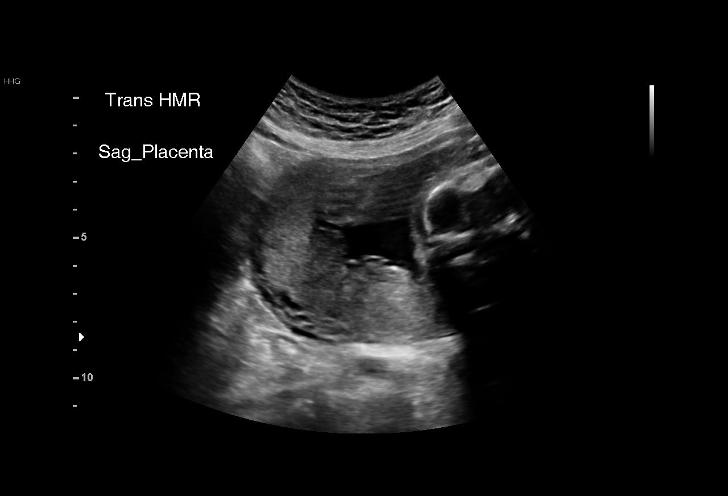
[im 9/41]
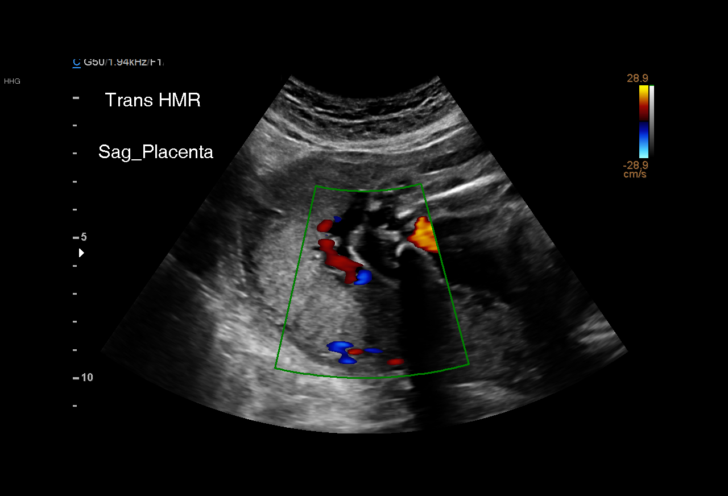
[im 12/41]
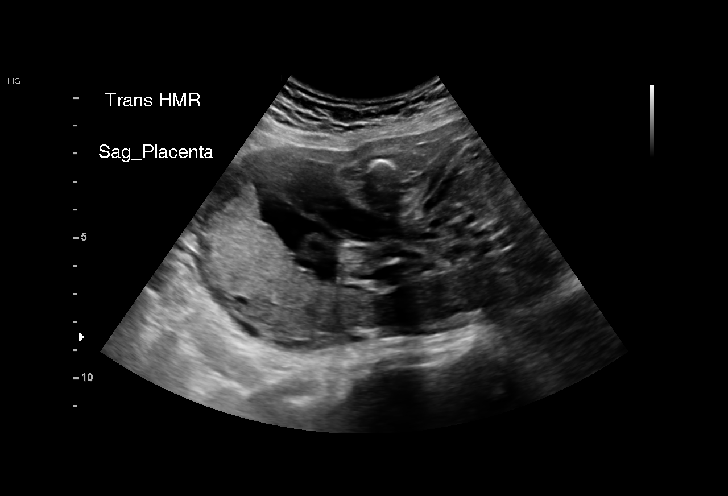
[im 15/41]
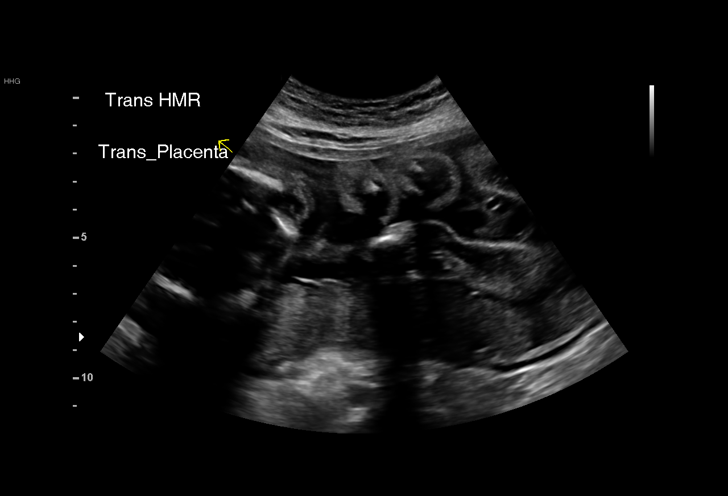
[im 18/41]
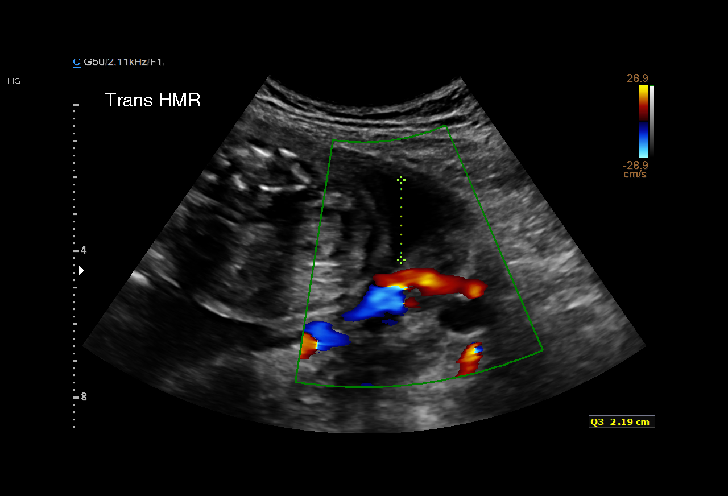
[im 21/41]
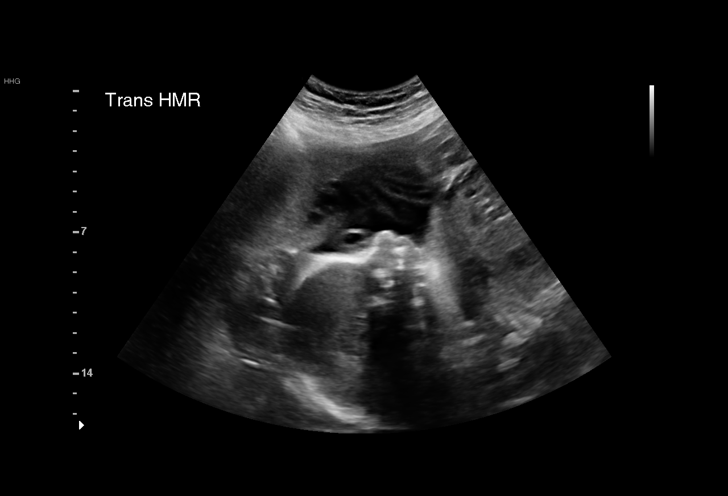
[im 23/41]
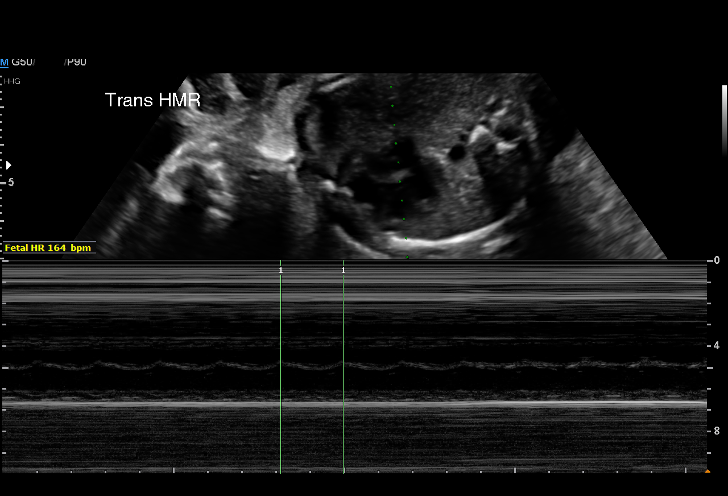
[im 26/41]
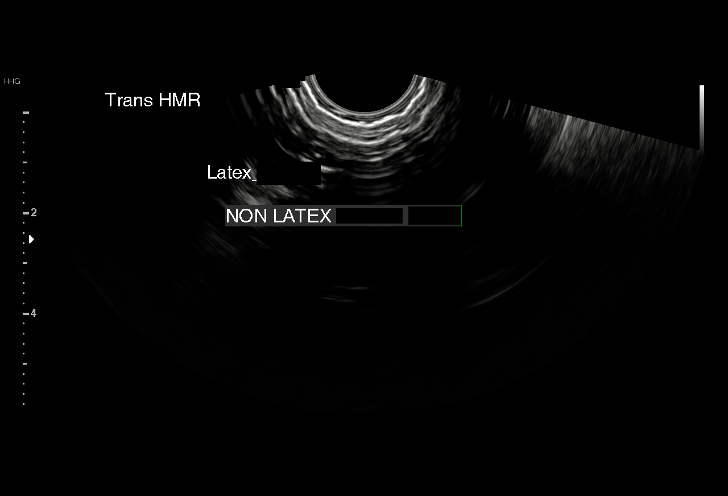
[im 29/41]
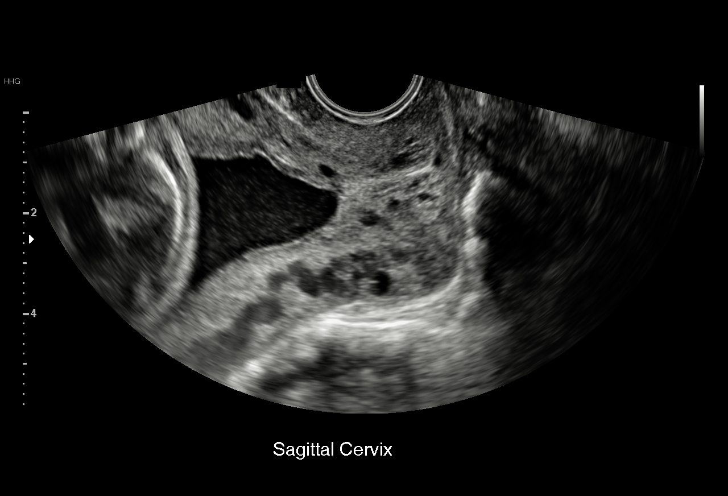
[im 32/41]
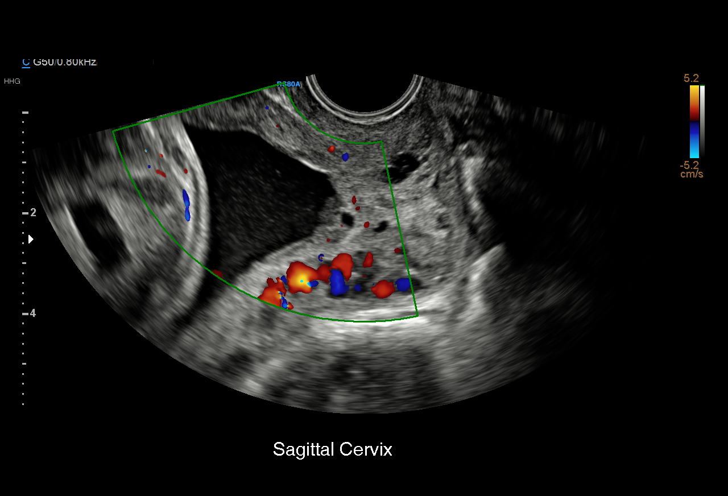
[im 35/41]
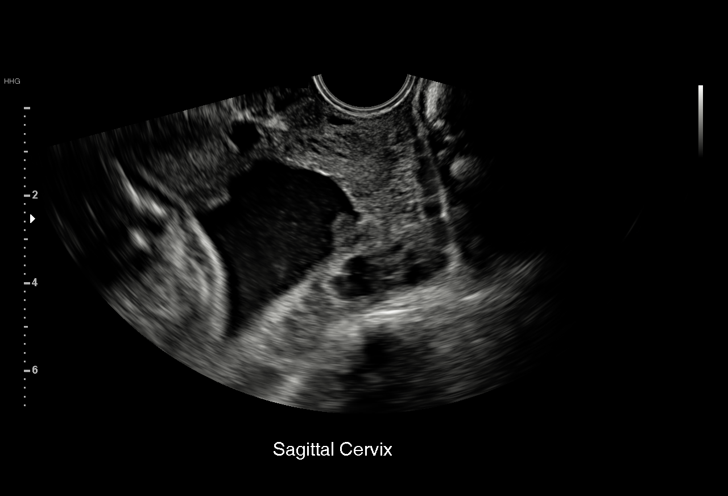
[im 38/41]
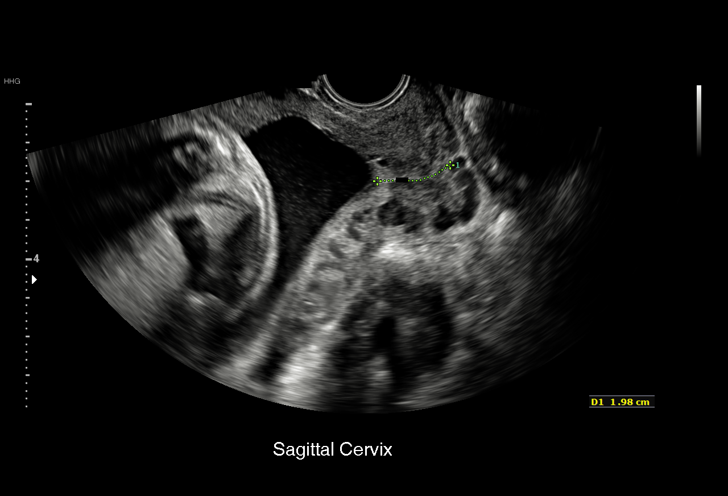
[im 41/41]
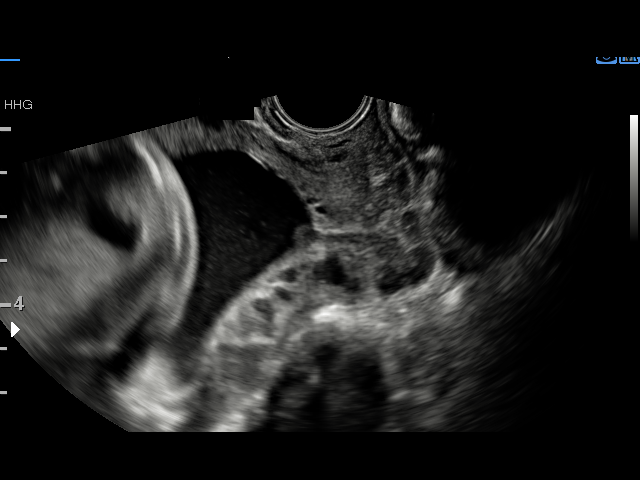

[15 of 28 positions shown; findings below may reference images not displayed]

1  JOHAN FERNANDO LISCANO            [id number]      [id number]     [id number]
Indications

28 weeks gestation of pregnancy
Asthma                                         [id number].[id number]
Short interval between pregancies, 2nd
trimester
Advanced maternal age multigravida 35+,
second trimester
Poor obstetric history: Previous
preeclampsia / eclampsia/gestational HTN
Poor obstetric history: Previous preterm
delivery, antepartum x2 (34 weeks -
spontaneous labor, 71wks - induced for preE)
Cervical shortening, third trimester
OB History

Blood Type:            Height:  5'2"   Weight (lb):  135       BMI:
Gravidity:    5         Term:   2        Prem:   2        SAB:   0
TOP:          0       Ectopic:  0        Living: 4
Fetal Evaluation

Num Of Fetuses:     1
Fetal Heart         164
Rate(bpm):
Cardiac Activity:   Observed
Presentation:       Transverse, head to maternal right
Placenta:           Fundal, above cervical os
P. Cord Insertion:  Visualized, central
Amniotic Fluid
AFI FV:      Subjectively within normal limits

AFI Sum(cm)     %Tile       Largest Pocket(cm)
10.51           15

RUQ(cm)       RLQ(cm)       LUQ(cm)        LLQ(cm)
2.76
Gestational Age

LMP:           29w 4d        Date:  01/18/16                 EDD:   10/24/16
Best:          28w 0d     Det. By:  Early Ultrasound         EDD:   11/04/16
(03/17/16)
Cervix Uterus Adnexa

Cervix
Length:            1.9  cm.
Appears funnelled, see comments.

Uterus
No abnormality visualized.

Left Ovary
Not visualized.

Right Ovary
Not visualized.

Adnexa:       No adnexal mass visualized.
Impression

SIUP at 28+0 weeks
active singleton fetus
Normal amniotic fluid volume
EV views of cervix: mild funneling with distal closed portion
measuring 1.9 cms

The US findings were shared with Ms. Labelle Salha Brack Tiger. She is
taking Ganguly.
Recommendations

Growth in 1 week as scheduled previously.
continue Skirman until 36 weeks.

## 2018-10-08 ENCOUNTER — Encounter: Payer: Self-pay | Admitting: *Deleted

## 2018-11-04 ENCOUNTER — Encounter (HOSPITAL_COMMUNITY): Payer: Self-pay | Admitting: *Deleted

## 2018-11-04 ENCOUNTER — Emergency Department (HOSPITAL_COMMUNITY)
Admission: EM | Admit: 2018-11-04 | Discharge: 2018-11-04 | Disposition: A | Discharge status: Home / Self Care | Payer: Self-pay | Attending: Emergency Medicine | Admitting: Emergency Medicine

## 2018-11-04 ENCOUNTER — Other Ambulatory Visit: Payer: Self-pay

## 2018-11-04 ENCOUNTER — Emergency Department (HOSPITAL_COMMUNITY): Payer: Self-pay

## 2018-11-04 DIAGNOSIS — Z87891 Personal history of nicotine dependence: Secondary | ICD-10-CM | POA: Insufficient documentation

## 2018-11-04 DIAGNOSIS — Z79899 Other long term (current) drug therapy: Secondary | ICD-10-CM | POA: Insufficient documentation

## 2018-11-04 DIAGNOSIS — M546 Pain in thoracic spine: Secondary | ICD-10-CM | POA: Insufficient documentation

## 2018-11-04 DIAGNOSIS — M542 Cervicalgia: Secondary | ICD-10-CM | POA: Insufficient documentation

## 2018-11-04 DIAGNOSIS — Z91040 Latex allergy status: Secondary | ICD-10-CM | POA: Insufficient documentation

## 2018-11-04 DIAGNOSIS — J45909 Unspecified asthma, uncomplicated: Secondary | ICD-10-CM | POA: Insufficient documentation

## 2018-11-04 MED ORDER — METHOCARBAMOL 500 MG PO TABS: 500.0000 mg | ORAL_TABLET | Freq: Two times a day (BID) | ORAL | 0 refills | Status: DC

## 2018-11-04 MED ORDER — METHOCARBAMOL 500 MG PO TABS
1000.0000 mg | ORAL_TABLET | Freq: Once | ORAL | Status: AC
  Administered 2018-11-04: 1000 mg via ORAL
  Filled 2018-11-04: qty 2

## 2018-11-04 MED ORDER — NAPROXEN 250 MG PO TABS
500.0000 mg | ORAL_TABLET | Freq: Once | ORAL | Status: AC
Start: 2018-11-04 — End: 2018-11-04
  Administered 2018-11-04: 500 mg via ORAL
  Filled 2018-11-04: qty 2

## 2018-11-04 MED ORDER — NAPROXEN 500 MG PO TABS: 500.0000 mg | ORAL_TABLET | Freq: Two times a day (BID) | ORAL | 0 refills | Status: DC

## 2018-11-04 MED ORDER — LIDOCAINE 5 % EX PTCH
1.0000 | MEDICATED_PATCH | CUTANEOUS | Status: DC
  Administered 2018-11-04: 1 via TRANSDERMAL
  Filled 2018-11-04: qty 1

## 2018-11-04 NOTE — Discharge Instructions (Signed)
The pain you are experiencing is likely due to muscle strain, you may take Naprosyn and Robaxin as needed for pain management. Do not combine with any pain reliever other than tylenol.  You may also use ice and heat, and over-the-counter remedies such as Biofreeze gel or salon pas lidocaine patches. The muscle soreness should improve over the next week. Follow up with your family doctor in the next week for a recheck if you are still having symptoms. Return to ED if pain is worsening, you develop weakness or numbness of extremities, or new or concerning symptoms develop.  

## 2018-11-04 NOTE — ED Notes (Signed)
Declined W/C at D/C and was escorted to lobby by RN. 
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
## 2019-05-07 ENCOUNTER — Encounter: Payer: Self-pay | Admitting: *Deleted

## 2020-01-01 ENCOUNTER — Ambulatory Visit: Payer: Self-pay | Admitting: Family Medicine

## 2020-02-14 ENCOUNTER — Ambulatory Visit: Payer: Self-pay | Admitting: Family Medicine

## 2020-03-02 ENCOUNTER — Ambulatory Visit: Payer: Self-pay | Attending: Family Medicine | Admitting: Family Medicine

## 2020-03-02 ENCOUNTER — Encounter: Payer: Self-pay | Admitting: Family Medicine

## 2020-03-02 ENCOUNTER — Other Ambulatory Visit: Payer: Self-pay

## 2020-03-02 DIAGNOSIS — J4540 Moderate persistent asthma, uncomplicated: Secondary | ICD-10-CM

## 2020-03-02 DIAGNOSIS — J3089 Other allergic rhinitis: Secondary | ICD-10-CM

## 2020-03-02 DIAGNOSIS — F411 Generalized anxiety disorder: Secondary | ICD-10-CM

## 2020-03-02 MED ORDER — ALBUTEROL SULFATE HFA 108 (90 BASE) MCG/ACT IN AERS: 2.0000 | INHALATION_SPRAY | Freq: Four times a day (QID) | RESPIRATORY_TRACT | 11 refills | Status: DC | PRN

## 2020-03-02 MED ORDER — MONTELUKAST SODIUM 10 MG PO TABS: 10.0000 mg | ORAL_TABLET | Freq: Every day | ORAL | 3 refills | Status: DC

## 2020-03-02 NOTE — Progress Notes (Signed)
Virtual Visit via Telephone Note  I connected with Cathy Nash on 03/02/20 at  9:50 AM EDT by telephone and verified that I am speaking with the correct person using two identifiers.   I discussed the limitations, risks, security and privacy concerns of performing an evaluation and management service by telephone and the availability of in person appointments. I also discussed with the patient that there may be a patient responsible charge related to this service. The patient expressed understanding and agreed to proceed.  Patient Location: Home Provider Location: [facility] Others participating in call: call initiated by Emilio Aspen, RMA who obtained an interpreter through [employer]; Seth Bake ID# [id number]   History of Present Illness:       39 year old female new to the practice who reports a history of asthma since childhood.  She reports that recently she has had increased difficulty with breathing/shortness of breath which she attributes to having to wear a mask.  She does get increased shortness of breath with walking.  She reports that her asthma tends to be triggered by having a cold, being exposed to dust or pollen as well as increased shortness of breath if she is in a small/crowded space.  She states that she also has anxiety which causes the increased shortness of breath in small spaces.  She is not currently on any medication for anxiety.  She also does not currently have any medication for asthma as she states that whenever she tries to obtain an inhaler she is told that she needs to have a prescription but she does not have insurance and therefore has been unable to get an inhaler.  She states that when she feels really short of breath, she breathes in vapor from a glass of alcohol and when she was a child in [place], her mother would give her garlic to help with her asthma.  She does have nightly sensation of shortness of breath and chest tightness with  occasional cough.  No current wheezing.  She reports that she has had a mild temperature overnight as well as soreness in her arm after getting the COVID-19 vaccine yesterday.  She otherwise feels well.  She does have issues with increased nasal congestion, increased shortness of breath and chest tightness with exposure to pollen and she states that she has noticed that this has occurred more often recently.   Past Medical History:  Diagnosis Date  . Anxiety   . Asthma   . Hemorrhoid   . Pregnancy induced hypertension    previous pregnancy  . Preterm labor    1st pregnancy d/t pre-e  . Pyelonephritis    June 2013  . Vaginal pessary in situ   . Varicose veins     Past Surgical History:  Procedure Laterality Date  . CESAREAN SECTION N/A 10/16/2016   Procedure: CESAREAN SECTION;  Surgeon: Aletha Halim, MD;  Location: [facility];  Service: Obstetrics;  Laterality: N/A;    Family History  Problem Relation Age of Onset  . Diabetes Mother   . Asthma Father   . Anesthesia problems Neg Hx   . Hypotension Neg Hx   . Malignant hyperthermia Neg Hx   . Pseudochol deficiency Neg Hx   . Other Neg Hx     Social History   Tobacco Use  . Smoking status: Former Research scientist (life sciences)  . Smokeless tobacco: Never Used  . Tobacco comment: yrs ago  Substance Use Topics  . Alcohol use: No    Alcohol/week: 0.0  standard drinks  . Drug use: No     Allergies  Allergen Reactions  . Iohexol Hives and Shortness Of Breath     Code: SOB, Desc: IMMEDIATELY SOB FOLLOWING IV INJECTION, SPOTTY HIVES, PT GIVEN BENADRYL AND EPI-ARS 08/22/09, Onset Date: [id number]   . Latex Itching and Rash       Observations/Objective: No vital signs or physical exam conducted as visit was done via telephone  Assessment and Plan: 1. Moderate persistent asthma without complication Prescription will be sent to this pharmacy for albuterol inhaler that patient can use as needed for shortness of breath and since patient also  has issues with allergic rhinitis, we will have her start Singulair 10 mg at bedtime.  Patient was instructed to make a follow-up appointment in approximately 6 weeks but to call or return sooner if she has any problems with the medication or worsening of her asthma symptoms.  She was also made aware that there is a financial program that she can apply for through this office to help with the cost of future visits and medications and she should stop at the front desk when she comes in to pick up her medications so that she can get information about an appointment with the financial counselor/applying for the financial assistance program. - albuterol (VENTOLIN HFA) 108 (90 Base) MCG/ACT inhaler; Inhale 2 puffs into the lungs every 6 (six) hours as needed for wheezing or shortness of breath.  Dispense: 18 g; Refill: 11 - montelukast (SINGULAIR) 10 MG tablet; Take 1 tablet (10 mg total) by mouth at bedtime.  Dispense: 30 tablet; Refill: 3  2. Non-seasonal allergic rhinitis, unspecified trigger She reports that her asthma is triggered by pollen which causes increased nasal congestion as well as chest tightness and shortness of breath.  We will have patient start Singulair 10 mg at bedtime in addition to using albuterol as needed. - montelukast (SINGULAIR) 10 MG tablet; Take 1 tablet (10 mg total) by mouth at bedtime.  Dispense: 30 tablet; Refill: 3  3. GAD (generalized anxiety disorder) Patient with complaint of generalized anxiety disorder for which she is not currently on medicine.  Will discuss this further with the patient at her 6-week appointment that will hopefully be in person.    Follow Up Instructions:Return in about 6 weeks (around 04/13/2020) for asthma/allergic rhinitis.    I discussed the assessment and treatment plan with the patient. The patient was provided an opportunity to ask questions and all were answered. The patient agreed with the plan and demonstrated an understanding of the  instructions.   The patient was advised to call back or seek an in-person evaluation if the symptoms worsen or if the condition fails to improve as anticipated.  I provided 15 minutes of non-face-to-face time during this encounter.   Cain Saupe, MD

## 2020-03-02 NOTE — Progress Notes (Signed)
Est for Asthma, per pt she's been having asthma since she was little   Interpreter is Sue Lush

## 2021-07-02 ENCOUNTER — Emergency Department (HOSPITAL_COMMUNITY): Payer: Self-pay

## 2021-07-02 ENCOUNTER — Other Ambulatory Visit: Payer: Self-pay

## 2021-07-02 ENCOUNTER — Encounter (HOSPITAL_COMMUNITY): Payer: Self-pay | Admitting: Emergency Medicine

## 2021-07-02 ENCOUNTER — Emergency Department (HOSPITAL_COMMUNITY)
Admission: EM | Admit: 2021-07-02 | Discharge: 2021-07-03 | Disposition: A | Discharge status: Home / Self Care | Payer: Self-pay | Attending: Student | Admitting: Student

## 2021-07-02 DIAGNOSIS — Z5321 Procedure and treatment not carried out due to patient leaving prior to being seen by health care provider: Secondary | ICD-10-CM | POA: Insufficient documentation

## 2021-07-02 DIAGNOSIS — R109 Unspecified abdominal pain: Secondary | ICD-10-CM | POA: Insufficient documentation

## 2021-07-02 LAB — COMPREHENSIVE METABOLIC PANEL
ALT: 22 U/L (ref 0–44)
AST: 46 U/L — ABNORMAL HIGH (ref 15–41)
Albumin: 3.7 g/dL (ref 3.5–5.0)
Alkaline Phosphatase: 161 U/L — ABNORMAL HIGH (ref 38–126)
Anion gap: 12 (ref 5–15)
BUN: 13 mg/dL (ref 6–20)
CO2: 24 mmol/L (ref 22–32)
Calcium: 9.1 mg/dL (ref 8.9–10.3)
Chloride: 99 mmol/L (ref 98–111)
Creatinine, Ser: 0.73 mg/dL (ref 0.44–1.00)
GFR, Estimated: >60 mL/min (ref >60)
Glucose, Bld: 253 mg/dL — ABNORMAL HIGH (ref 70–99)
Potassium: 3.6 mmol/L (ref 3.5–5.1)
Sodium: 135 mmol/L (ref 135–145)
Total Bilirubin: 0.4 mg/dL (ref 0.3–1.2)
Total Protein: 7 g/dL (ref 6.5–8.1)

## 2021-07-02 LAB — URINALYSIS, ROUTINE W REFLEX MICROSCOPIC
Bacteria, UA: NONE SEEN
Bilirubin Urine: NEGATIVE
Glucose, UA: 150 mg/dL — AB
Ketones, ur: NEGATIVE mg/dL
Leukocytes,Ua: NEGATIVE
Nitrite: NEGATIVE
Protein, ur: NEGATIVE mg/dL
Specific Gravity, Urine: 1.024 (ref 1.005–1.030)
pH: 5 (ref 5.0–8.0)

## 2021-07-02 LAB — CBC WITH DIFFERENTIAL/PLATELET
Abs Immature Granulocytes: 0.02 10*3/uL (ref 0.00–0.07)
Basophils Absolute: 0.1 10*3/uL (ref 0.0–0.1)
Basophils Relative: 1 %
Eosinophils Absolute: 0.2 10*3/uL (ref 0.0–0.5)
Eosinophils Relative: 2 %
HCT: 39 % (ref 36.0–46.0)
Hemoglobin: 12.6 g/dL (ref 12.0–15.0)
Immature Granulocytes: 0 %
Lymphocytes Relative: 27 %
Lymphs Abs: 1.8 10*3/uL (ref 0.7–4.0)
MCH: 29.4 pg (ref 26.0–34.0)
MCHC: 32.3 g/dL (ref 30.0–36.0)
MCV: 90.9 fL (ref 80.0–100.0)
Monocytes Absolute: 0.4 10*3/uL (ref 0.1–1.0)
Monocytes Relative: 6 %
Neutro Abs: 4.3 10*3/uL (ref 1.7–7.7)
Neutrophils Relative %: 64 %
Platelets: 359 10*3/uL (ref 150–400)
RBC: 4.29 MIL/uL (ref 3.87–5.11)
RDW: 14 % (ref 11.5–15.5)
WBC: 6.7 10*3/uL (ref 4.0–10.5)
nRBC: 0 % (ref 0.0–0.2)

## 2021-07-02 LAB — LIPASE, BLOOD: Lipase: 47 U/L (ref 11–51)

## 2021-07-02 LAB — PREGNANCY, URINE: Preg Test, Ur: NEGATIVE

## 2021-07-02 MED ORDER — KETOROLAC TROMETHAMINE 30 MG/ML IJ SOLN
30.0000 mg | Freq: Once | INTRAMUSCULAR | Status: AC
  Administered 2021-07-02: 30 mg via INTRAMUSCULAR
  Filled 2021-07-02: qty 1

## 2021-07-02 NOTE — ED Provider Notes (Signed)
Emergency Medicine Provider Triage Evaluation Note  Aranda Bihm , a 40 y.o. female  was evaluated in triage.  Pt complains of upper abdominal pain x1 day.  Associated nausea but no vomiting.  The pain is constant, worse after drinking milk.  There is no dysuria or hematuria.  She had a C-section, but no other abdominal surgeries.  No history of kidney stones.  Nothing makes the pain..  Review of Systems  Positive: ABOVE Negative: ABOVE  Physical Exam  BP (!) 112/93   Pulse 96   Temp 98.6 F (37 C)   Resp 18   SpO2 100%  Gen:   Awake, no distress   Resp:  Normal effort  MSK:   Moves extremities without difficulty  Other:  RUQ AND LUQ TTP, + L CVAT  Medical Decision Making  Medically screening exam initiated at 6:52 PM.  Appropriate orders placed.  Lucillie Lopez-Jarquin was informed that the remainder of the evaluation will be completed by another provider, this initial triage assessment does not replace that evaluation, and the importance of remaining in the ED until their evaluation is complete.     Theron Arista, PA-C 07/02/21 1853    Dione Booze, MD 07/05/21 2239

## 2021-07-02 NOTE — ED Triage Notes (Signed)
Pt having left side abd pain since yesterday.

## 2021-07-04 ENCOUNTER — Other Ambulatory Visit: Payer: Self-pay

## 2021-07-04 ENCOUNTER — Emergency Department (HOSPITAL_COMMUNITY)
Admission: EM | Admit: 2021-07-04 | Discharge: 2021-07-04 | Disposition: A | Discharge status: Home / Self Care | Payer: Self-pay | Attending: Emergency Medicine | Admitting: Emergency Medicine

## 2021-07-04 ENCOUNTER — Emergency Department (HOSPITAL_COMMUNITY): Payer: Self-pay

## 2021-07-04 DIAGNOSIS — R1013 Epigastric pain: Secondary | ICD-10-CM

## 2021-07-04 DIAGNOSIS — N200 Calculus of kidney: Secondary | ICD-10-CM | POA: Insufficient documentation

## 2021-07-04 DIAGNOSIS — N898 Other specified noninflammatory disorders of vagina: Secondary | ICD-10-CM | POA: Insufficient documentation

## 2021-07-04 DIAGNOSIS — Z87891 Personal history of nicotine dependence: Secondary | ICD-10-CM | POA: Insufficient documentation

## 2021-07-04 DIAGNOSIS — K8020 Calculus of gallbladder without cholecystitis without obstruction: Secondary | ICD-10-CM | POA: Insufficient documentation

## 2021-07-04 DIAGNOSIS — J45909 Unspecified asthma, uncomplicated: Secondary | ICD-10-CM | POA: Insufficient documentation

## 2021-07-04 LAB — CBC
HCT: 36.1 % (ref 36.0–46.0)
Hemoglobin: 12 g/dL (ref 12.0–15.0)
MCH: 29.9 pg (ref 26.0–34.0)
MCHC: 33.2 g/dL (ref 30.0–36.0)
MCV: 90 fL (ref 80.0–100.0)
Platelets: 333 10*3/uL (ref 150–400)
RBC: 4.01 MIL/uL (ref 3.87–5.11)
RDW: 14.1 % (ref 11.5–15.5)
WBC: 7.9 10*3/uL (ref 4.0–10.5)
nRBC: 0 % (ref 0.0–0.2)

## 2021-07-04 LAB — COMPREHENSIVE METABOLIC PANEL
ALT: 25 U/L (ref 0–44)
AST: 29 U/L (ref 15–41)
Albumin: 4.1 g/dL (ref 3.5–5.0)
Alkaline Phosphatase: 134 U/L — ABNORMAL HIGH (ref 38–126)
Anion gap: 10 (ref 5–15)
BUN: 15 mg/dL (ref 6–20)
CO2: 24 mmol/L (ref 22–32)
Calcium: 9 mg/dL (ref 8.9–10.3)
Chloride: 104 mmol/L (ref 98–111)
Creatinine, Ser: 0.48 mg/dL (ref 0.44–1.00)
GFR, Estimated: >60 mL/min (ref >60)
Glucose, Bld: 109 mg/dL — ABNORMAL HIGH (ref 70–99)
Potassium: 3.7 mmol/L (ref 3.5–5.1)
Sodium: 138 mmol/L (ref 135–145)
Total Bilirubin: 0.4 mg/dL (ref 0.3–1.2)
Total Protein: 7.5 g/dL (ref 6.5–8.1)

## 2021-07-04 LAB — URINALYSIS, ROUTINE W REFLEX MICROSCOPIC
Bilirubin Urine: NEGATIVE
Glucose, UA: NEGATIVE mg/dL
Ketones, ur: NEGATIVE mg/dL
Nitrite: NEGATIVE
Protein, ur: NEGATIVE mg/dL
Specific Gravity, Urine: 1.013 (ref 1.005–1.030)
pH: 6 (ref 5.0–8.0)

## 2021-07-04 LAB — LIPASE, BLOOD: Lipase: 45 U/L (ref 11–51)

## 2021-07-04 LAB — I-STAT BETA HCG BLOOD, ED (MC, WL, AP ONLY): I-stat hCG, quantitative: <5 m[IU]/mL (ref <5)

## 2021-07-04 MED ORDER — SUCRALFATE 1 G PO TABS
1.0000 g | ORAL_TABLET | Freq: Four times a day (QID) | ORAL | 0 refills | Status: AC | PRN
Start: 2021-07-04 — End: ?

## 2021-07-04 MED ORDER — OMEPRAZOLE 20 MG PO CPDR: 20.0000 mg | DELAYED_RELEASE_CAPSULE | Freq: Every day | ORAL | 1 refills | Status: AC

## 2021-07-04 MED ORDER — ALUM & MAG HYDROXIDE-SIMETH 200-200-20 MG/5ML PO SUSP
30.0000 mL | Freq: Once | ORAL | Status: AC
  Administered 2021-07-04: 30 mL via ORAL
  Filled 2021-07-04: qty 30

## 2021-07-04 MED ORDER — ONDANSETRON 4 MG PO TBDP: 4.0000 mg | ORAL_TABLET | Freq: Once | ORAL | Status: DC | PRN

## 2021-07-04 MED ORDER — ACETAMINOPHEN 500 MG PO TABS
1000.0000 mg | ORAL_TABLET | Freq: Once | ORAL | Status: AC
  Administered 2021-07-04: 1000 mg via ORAL
  Filled 2021-07-04: qty 2

## 2021-07-04 NOTE — ED Triage Notes (Signed)
Pt came in with c/o epigastric pain times three days. Pt went to [facility] yesterday but was never seen. Only abdominal hx is C-section. Pt endorses n/v

## 2021-07-04 NOTE — Discharge Instructions (Addendum)
You were evaluated in the Emergency Department and after careful evaluation, we did not find any emergent condition requiring admission or further testing in the hospital.  Your exam/testing today is overall reassuring.  Symptoms seem to be due to acid reflux.  Please take the omeprazole medication daily to prevent pain.  You can take the Carafate medication up to 4 times daily for immediate relief.  Please call the office of the general surgeons for follow-up regarding the stones in your gallbladder.  Please return to the Emergency Department if you experience any worsening of your condition.   Thank you for allowing [place] to be a part of your care.

## 2021-07-04 NOTE — ED Provider Notes (Addendum)
[facility]Y DEPT [facility] Emergency Department Provider Note MRN:  [id number]  Arrival date & time: 07/04/21     Chief Complaint   Abdominal Pain   History of Present Illness   Cathy Nash is a 40 y.o. year-old female with a history of anxiety presenting to the ED with chief complaint of flank pain.  Location: Epigastric abdominal pain radiating into the right flank Duration: Several days Onset: Suddenly worse this evening Timing: Constant Description: Sharp burning Severity: Moderate Exacerbating/Alleviating Factors: Improved with milk Associated Symptoms: None Pertinent Negatives: Denies nausea vomiting or diarrhea, no chest pain or shortness of breath, no lower abdominal pain   Review of Systems  A complete 10 system review of systems was obtained and all systems are negative except as noted in the HPI and PMH.   Patient's Health History    Past Medical History:  Diagnosis Date   Anxiety    Asthma    Hemorrhoid    Pregnancy induced hypertension    previous pregnancy   Preterm labor    1st pregnancy d/t pre-e   Pyelonephritis    June 2013   Vaginal pessary in situ    Varicose veins     Past Surgical History:  Procedure Laterality Date   CESAREAN SECTION N/A 10/16/2016   Procedure: CESAREAN SECTION;  Surgeon: [place] Bing, MD;  [facility];  Service: Obstetrics;  Laterality: N/A;    Family History  Problem Relation Age of Onset   Diabetes Mother    Asthma Father    Anesthesia problems Neg Hx    Hypotension Neg Hx    Malignant hyperthermia Neg Hx    Pseudochol deficiency Neg Hx    Other Neg Hx     Social History   Socioeconomic History   Marital status: Single    Spouse name: Not on file   Number of children: Not on file   Years of education: Not on file   Highest education level: Not on file  Occupational History   Not on file  Tobacco Use   Smoking status: Former   Smokeless tobacco: Never   Tobacco  comments:    yrs ago  Substance and Sexual Activity   Alcohol use: No    Alcohol/week: 0.0 standard drinks   Drug use: No   Sexual activity: Not Currently    Partners: Male    Birth control/protection: None  Other Topics Concern   Not on file  Social History Narrative   ** Merged History Encounter **       ** Merged History Encounter **       Social Determinants of Health   Financial Resource Strain: Not on file  Food Insecurity: Not on file  Transportation Needs: Not on file  Physical Activity: Not on file  Stress: Not on file  Social Connections: Not on file  Intimate Partner Violence: Not on file     Physical Exam   Vitals:   07/04/21 0401 07/04/21 0500  BP: 122/82 100/67  Pulse: 77 69  Resp: 16 16  Temp:    SpO2: 100% 97%    CONSTITUTIONAL: Well-appearing, NAD NEURO:  Alert and oriented x 3, no focal deficits EYES:  eyes equal and reactive ENT/NECK:  no LAD, no JVD CARDIO: Regular rate, well-perfused, normal S1 and S2 PULM:  CTAB no wheezing or rhonchi GI/GU:  normal bowel sounds, non-distended, non-tender MSK/SPINE:  No gross deformities, no edema SKIN:  no rash, atraumatic PSYCH:  Appropriate speech and behavior  *  Additional and/or pertinent findings included in MDM below  Diagnostic and Interventional Summary    EKG Interpretation  Date/Time:    Ventricular Rate:    PR Interval:    QRS Duration:   QT Interval:    QTC Calculation:   R Axis:     Text Interpretation:         Labs Reviewed  COMPREHENSIVE METABOLIC PANEL - Abnormal; Notable for the following components:      Result Value   Glucose, Bld 109 (*)    Alkaline Phosphatase 134 (*)    All other components within normal limits  URINALYSIS, ROUTINE W REFLEX MICROSCOPIC - Abnormal; Notable for the following components:   Hgb urine dipstick LARGE (*)    Leukocytes,Ua TRACE (*)    Bacteria, UA RARE (*)    All other components within normal limits  LIPASE, BLOOD  CBC  I-STAT BETA  HCG BLOOD, ED ([facility], [facility], [facility] ONLY)    CT RENAL STONE STUDY  Final Result      Medications  ondansetron (ZOFRAN-ODT) disintegrating tablet 4 mg (has no administration in time range)  alum & mag hydroxide-simeth (MAALOX/MYLANTA) 200-200-20 MG/5ML suspension 30 mL (30 mLs Oral Given 07/04/21 0420)  acetaminophen (TYLENOL) tablet 1,000 mg (1,000 mg Oral Given 07/04/21 0420)     Procedures  /  Critical Care Procedures  ED Course and Medical Decision Making  I have reviewed the triage vital signs, the nursing notes, and pertinent available records from the EMR.  Listed above are laboratory and imaging tests that I personally ordered, reviewed, and interpreted and then considered in my medical decision making (see below for details).  Epigastric pain improved with milk intake, suspect acid reflux.  Strange that she also has significant flank pain, also considering kidney stone.  Will obtain CT to further evaluate.  Providing GI cocktail.     Patient's pain is resolved after GI cocktail.  Labs are reassuring, CT is without acute process.  There is evidence of cholelithiasis.  Patient has no right upper quadrant tenderness, no fever, nothing to suggest cholecystitis.  Pain could be due to a biliary colic but I favor this to be more of an incidental finding given the history of improving with milk and in the ED improving with GI cocktail will refer to general surgery, appropriate for discharge.  Elmer Sow. Pilar Plate, MD [facility] Emergency Medicine [facility] [email or web address]  Final Clinical Impressions(s) / ED Diagnoses     ICD-10-CM   1. Epigastric pain  R10.13       ED Discharge Orders          Ordered    omeprazole (PRILOSEC) 20 MG capsule  Daily        07/04/21 0557    sucralfate (CARAFATE) 1 g tablet  4 times daily PRN        07/04/21 0557             Discharge Instructions Discussed with and Provided to Patient:     Discharge Instructions      You  were evaluated in the Emergency Department and after careful evaluation, we did not find any emergent condition requiring admission or further testing in the hospital.  Your exam/testing today is overall reassuring.  Symptoms seem to be due to acid reflux.  Please take the omeprazole medication daily to prevent pain.  You can take the Carafate medication up to 4 times daily for immediate relief.  Please call the office of the general  surgeons for follow-up regarding the stones in your gallbladder.  Please return to the Emergency Department if you experience any worsening of your condition.   Thank you for allowing [place] to be a part of your care.        Sabas Sous, MD 07/04/21 0600    Sabas Sous, MD 07/04/21 [phone]

## 2021-09-23 ENCOUNTER — Encounter (HOSPITAL_COMMUNITY): Payer: Self-pay | Admitting: *Deleted

## 2021-09-23 ENCOUNTER — Observation Stay (HOSPITAL_COMMUNITY)
Admission: EM | Admit: 2021-09-23 | Discharge: 2021-09-24 | Disposition: A | Discharge status: Home / Self Care | Payer: Self-pay | Attending: Emergency Medicine | Admitting: Emergency Medicine

## 2021-09-23 ENCOUNTER — Other Ambulatory Visit: Payer: Self-pay

## 2021-09-23 DIAGNOSIS — K810 Acute cholecystitis: Secondary | ICD-10-CM | POA: Diagnosis present

## 2021-09-23 DIAGNOSIS — K8012 Calculus of gallbladder with acute and chronic cholecystitis without obstruction: Principal | ICD-10-CM | POA: Insufficient documentation

## 2021-09-23 DIAGNOSIS — J45909 Unspecified asthma, uncomplicated: Secondary | ICD-10-CM | POA: Insufficient documentation

## 2021-09-23 DIAGNOSIS — Z91040 Latex allergy status: Secondary | ICD-10-CM | POA: Insufficient documentation

## 2021-09-23 DIAGNOSIS — Z20822 Contact with and (suspected) exposure to covid-19: Secondary | ICD-10-CM | POA: Insufficient documentation

## 2021-09-23 DIAGNOSIS — K8020 Calculus of gallbladder without cholecystitis without obstruction: Secondary | ICD-10-CM | POA: Diagnosis present

## 2021-09-23 DIAGNOSIS — Z87891 Personal history of nicotine dependence: Secondary | ICD-10-CM | POA: Insufficient documentation

## 2021-09-23 DIAGNOSIS — Z79899 Other long term (current) drug therapy: Secondary | ICD-10-CM | POA: Insufficient documentation

## 2021-09-23 DIAGNOSIS — R1013 Epigastric pain: Secondary | ICD-10-CM

## 2021-09-23 LAB — URINALYSIS, ROUTINE W REFLEX MICROSCOPIC
Bilirubin Urine: NEGATIVE
Glucose, UA: NEGATIVE mg/dL
Ketones, ur: 20 mg/dL — AB
Leukocytes,Ua: NEGATIVE
Nitrite: NEGATIVE
Protein, ur: 30 mg/dL — AB
RBC / HPF: >50 RBC/hpf — ABNORMAL HIGH (ref 0–5)
Specific Gravity, Urine: 1.02 (ref 1.005–1.030)
pH: 6 (ref 5.0–8.0)

## 2021-09-23 LAB — COMPREHENSIVE METABOLIC PANEL
ALT: 51 U/L — ABNORMAL HIGH (ref 0–44)
AST: 58 U/L — ABNORMAL HIGH (ref 15–41)
Albumin: 3.6 g/dL (ref 3.5–5.0)
Alkaline Phosphatase: 146 U/L — ABNORMAL HIGH (ref 38–126)
Anion gap: 11 (ref 5–15)
BUN: 11 mg/dL (ref 6–20)
CO2: 26 mmol/L (ref 22–32)
Calcium: 9.3 mg/dL (ref 8.9–10.3)
Chloride: 100 mmol/L (ref 98–111)
Creatinine, Ser: 0.59 mg/dL (ref 0.44–1.00)
GFR, Estimated: >60 mL/min (ref >60)
Glucose, Bld: 138 mg/dL — ABNORMAL HIGH (ref 70–99)
Potassium: 3.3 mmol/L — ABNORMAL LOW (ref 3.5–5.1)
Sodium: 137 mmol/L (ref 135–145)
Total Bilirubin: 0.5 mg/dL (ref 0.3–1.2)
Total Protein: 6.9 g/dL (ref 6.5–8.1)

## 2021-09-23 LAB — CBC
HCT: 35.4 % — ABNORMAL LOW (ref 36.0–46.0)
Hemoglobin: 11.5 g/dL — ABNORMAL LOW (ref 12.0–15.0)
MCH: 29.4 pg (ref 26.0–34.0)
MCHC: 32.5 g/dL (ref 30.0–36.0)
MCV: 90.5 fL (ref 80.0–100.0)
Platelets: 351 10*3/uL (ref 150–400)
RBC: 3.91 MIL/uL (ref 3.87–5.11)
RDW: 14.5 % (ref 11.5–15.5)
WBC: 10.3 10*3/uL (ref 4.0–10.5)
nRBC: 0 % (ref 0.0–0.2)

## 2021-09-23 LAB — I-STAT BETA HCG BLOOD, ED (MC, WL, AP ONLY): I-stat hCG, quantitative: 7.5 m[IU]/mL — ABNORMAL HIGH (ref <5)

## 2021-09-23 LAB — LIPASE, BLOOD: Lipase: 32 U/L (ref 11–51)

## 2021-09-23 MED ORDER — ONDANSETRON 4 MG PO TBDP
4.0000 mg | ORAL_TABLET | Freq: Once | ORAL | Status: AC | PRN
  Administered 2021-09-23: 4 mg via ORAL
  Filled 2021-09-23: qty 1

## 2021-09-23 MED ORDER — ACETAMINOPHEN 325 MG PO TABS: 650.0000 mg | ORAL_TABLET | Freq: Once | ORAL | Status: DC

## 2021-09-23 NOTE — ED Provider Notes (Signed)
Emergency Medicine Provider Triage Evaluation Note  Cathy Nash , a 40 y.o. female  was evaluated in triage.  Pt complains of epigastric abdominal pain over the last 2 hours.  She endorses similar symptoms in the past had a work-up at [facility] and she was found to have GERD.  She rates her epigastric abdominal pain moderate in severity.  She denies any fever, chest pain, shortness of breath, loss of consciousness.  Review of Systems  Positive:  Negative: See above   Physical Exam  BP 135/68 (BP Location: Left Arm)   Pulse 60   Temp 98.3 F (36.8 C) (Oral)   Resp 20   SpO2 100%  Gen:   Awake, no distress   Resp:  Normal effort  MSK:   Moves extremities without difficulty  Other:  Mild epigastric tenderness.  The rest the abdomen is nontender.  Medical Decision Making  Medically screening exam initiated at 7:42 PM.  Appropriate orders placed.  [name] was informed that the remainder of the evaluation will be completed by another provider, this initial triage assessment does not replace that evaluation, and the importance of remaining in the ED until their evaluation is complete.     Honor Loh [place], PA-C 09/23/21 1943    Benjiman Core, MD 09/23/21 2229

## 2021-09-23 NOTE — ED Triage Notes (Signed)
Pt reports mid epigastric pain since around 5pm with n/v. Has back pain with urinary frequency, no pain with urination.

## 2021-09-24 ENCOUNTER — Observation Stay (HOSPITAL_COMMUNITY): Payer: Self-pay | Admitting: Anesthesiology

## 2021-09-24 ENCOUNTER — Encounter (HOSPITAL_COMMUNITY): Payer: Self-pay

## 2021-09-24 ENCOUNTER — Emergency Department (HOSPITAL_COMMUNITY): Payer: Self-pay

## 2021-09-24 ENCOUNTER — Encounter (HOSPITAL_COMMUNITY)
Admission: EM | Disposition: A | Discharge status: Home / Self Care | Payer: Self-pay | Source: Home / Self Care | Attending: Emergency Medicine

## 2021-09-24 DIAGNOSIS — K810 Acute cholecystitis: Secondary | ICD-10-CM | POA: Diagnosis present

## 2021-09-24 DIAGNOSIS — K8020 Calculus of gallbladder without cholecystitis without obstruction: Secondary | ICD-10-CM | POA: Diagnosis present

## 2021-09-24 HISTORY — PX: CHOLECYSTECTOMY: SHX55

## 2021-09-24 LAB — COMPREHENSIVE METABOLIC PANEL
ALT: 56 U/L — ABNORMAL HIGH (ref 0–44)
AST: 52 U/L — ABNORMAL HIGH (ref 15–41)
Albumin: 4.3 g/dL (ref 3.5–5.0)
Alkaline Phosphatase: 174 U/L — ABNORMAL HIGH (ref 38–126)
Anion gap: 12 (ref 5–15)
BUN: 7 mg/dL (ref 6–20)
CO2: 29 mmol/L (ref 22–32)
Calcium: 9.5 mg/dL (ref 8.9–10.3)
Chloride: 97 mmol/L — ABNORMAL LOW (ref 98–111)
Creatinine, Ser: 0.59 mg/dL (ref 0.44–1.00)
GFR, Estimated: >60 mL/min (ref >60)
Glucose, Bld: 105 mg/dL — ABNORMAL HIGH (ref 70–99)
Potassium: 3.2 mmol/L — ABNORMAL LOW (ref 3.5–5.1)
Sodium: 138 mmol/L (ref 135–145)
Total Bilirubin: 0.8 mg/dL (ref 0.3–1.2)
Total Protein: 8.3 g/dL — ABNORMAL HIGH (ref 6.5–8.1)

## 2021-09-24 LAB — RESP PANEL BY RT-PCR (FLU A&B, COVID) ARPGX2
Influenza A by PCR: NEGATIVE
Influenza B by PCR: NEGATIVE
SARS Coronavirus 2 by RT PCR: NEGATIVE

## 2021-09-24 LAB — CBC
HCT: 41.9 % (ref 36.0–46.0)
Hemoglobin: 14 g/dL (ref 12.0–15.0)
MCH: 30 pg (ref 26.0–34.0)
MCHC: 33.4 g/dL (ref 30.0–36.0)
MCV: 89.9 fL (ref 80.0–100.0)
Platelets: 395 10*3/uL (ref 150–400)
RBC: 4.66 MIL/uL (ref 3.87–5.11)
RDW: 14.5 % (ref 11.5–15.5)
WBC: 10.8 10*3/uL — ABNORMAL HIGH (ref 4.0–10.5)
nRBC: 0 % (ref 0.0–0.2)

## 2021-09-24 LAB — HCG, QUANTITATIVE, PREGNANCY: hCG, Beta Chain, Quant, S: <1 m[IU]/mL (ref <5)

## 2021-09-24 SURGERY — LAPAROSCOPIC CHOLECYSTECTOMY
Anesthesia: General | Site: Abdomen

## 2021-09-24 MED ORDER — ONDANSETRON 4 MG PO TBDP: 4.0000 mg | ORAL_TABLET | Freq: Four times a day (QID) | ORAL | Status: DC | PRN

## 2021-09-24 MED ORDER — MORPHINE SULFATE (PF) 4 MG/ML IV SOLN
4.0000 mg | Freq: Once | INTRAVENOUS | Status: DC
Start: 2021-09-24 — End: 2021-09-24

## 2021-09-24 MED ORDER — FENTANYL CITRATE (PF) 100 MCG/2ML IJ SOLN
INTRAMUSCULAR | Status: AC
  Filled 2021-09-24: qty 2

## 2021-09-24 MED ORDER — ONDANSETRON HCL 4 MG/2ML IJ SOLN: 4.0000 mg | Freq: Once | INTRAMUSCULAR | Status: DC | PRN

## 2021-09-24 MED ORDER — LIDOCAINE 2% (20 MG/ML) 5 ML SYRINGE
INTRAMUSCULAR | Status: DC | PRN
  Administered 2021-09-24: 100 mg via INTRAVENOUS

## 2021-09-24 MED ORDER — OXYCODONE HCL 5 MG PO TABS: 5.0000 mg | ORAL_TABLET | Freq: Four times a day (QID) | ORAL | 0 refills | Status: AC | PRN

## 2021-09-24 MED ORDER — OXYCODONE HCL 5 MG PO TABS: 5.0000 mg | ORAL_TABLET | Freq: Four times a day (QID) | ORAL | 0 refills | Status: DC | PRN

## 2021-09-24 MED ORDER — SIMETHICONE 80 MG PO CHEW
40.0000 mg | CHEWABLE_TABLET | Freq: Four times a day (QID) | ORAL | Status: DC | PRN
  Filled 2021-09-24: qty 1

## 2021-09-24 MED ORDER — ORAL CARE MOUTH RINSE: 15.0000 mL | Freq: Once | OROMUCOSAL | Status: AC

## 2021-09-24 MED ORDER — SUGAMMADEX SODIUM 200 MG/2ML IV SOLN
INTRAVENOUS | Status: DC | PRN
  Administered 2021-09-24: 200 mg via INTRAVENOUS

## 2021-09-24 MED ORDER — ENOXAPARIN SODIUM 40 MG/0.4ML IJ SOSY: 40.0000 mg | PREFILLED_SYRINGE | INTRAMUSCULAR | Status: DC

## 2021-09-24 MED ORDER — ONDANSETRON HCL 4 MG/2ML IJ SOLN
4.0000 mg | Freq: Once | INTRAMUSCULAR | Status: AC
  Administered 2021-09-24: 4 mg via INTRAVENOUS
  Filled 2021-09-24: qty 2

## 2021-09-24 MED ORDER — DEXAMETHASONE SODIUM PHOSPHATE 10 MG/ML IJ SOLN
INTRAMUSCULAR | Status: AC
  Filled 2021-09-24: qty 1

## 2021-09-24 MED ORDER — KETOROLAC TROMETHAMINE 30 MG/ML IJ SOLN
INTRAMUSCULAR | Status: DC | PRN
  Administered 2021-09-24: 30 mg via INTRAVENOUS

## 2021-09-24 MED ORDER — FENTANYL CITRATE (PF) 250 MCG/5ML IJ SOLN
INTRAMUSCULAR | Status: DC | PRN
  Administered 2021-09-24: 150 ug via INTRAVENOUS
  Administered 2021-09-24: 100 ug via INTRAVENOUS

## 2021-09-24 MED ORDER — ONDANSETRON HCL 4 MG/2ML IJ SOLN
4.0000 mg | Freq: Four times a day (QID) | INTRAMUSCULAR | Status: DC | PRN
  Filled 2021-09-24: qty 2

## 2021-09-24 MED ORDER — METOPROLOL TARTRATE 5 MG/5ML IV SOLN: 5.0000 mg | Freq: Four times a day (QID) | INTRAVENOUS | Status: DC | PRN

## 2021-09-24 MED ORDER — DEXAMETHASONE SODIUM PHOSPHATE 10 MG/ML IJ SOLN
INTRAMUSCULAR | Status: DC | PRN
  Administered 2021-09-24: 10 mg via INTRAVENOUS

## 2021-09-24 MED ORDER — DOCUSATE SODIUM 100 MG PO CAPS: 100.0000 mg | ORAL_CAPSULE | Freq: Two times a day (BID) | ORAL | Status: DC

## 2021-09-24 MED ORDER — FENTANYL CITRATE (PF) 100 MCG/2ML IJ SOLN
50.0000 ug | Freq: Once | INTRAMUSCULAR | Status: AC
  Administered 2021-09-24: 50 ug via INTRAVENOUS

## 2021-09-24 MED ORDER — SODIUM CHLORIDE 0.9 % IR SOLN
Status: DC | PRN
  Administered 2021-09-24: 1000 mL

## 2021-09-24 MED ORDER — CHLORHEXIDINE GLUCONATE 0.12 % MT SOLN: 15.0000 mL | Freq: Once | OROMUCOSAL | Status: AC

## 2021-09-24 MED ORDER — SODIUM CHLORIDE 0.9 % IV SOLN: INTRAVENOUS | Status: DC

## 2021-09-24 MED ORDER — PROPOFOL 10 MG/ML IV BOLUS
INTRAVENOUS | Status: AC
  Filled 2021-09-24: qty 20

## 2021-09-24 MED ORDER — BUPIVACAINE-EPINEPHRINE (PF) 0.25% -1:200000 IJ SOLN
INTRAMUSCULAR | Status: AC
  Filled 2021-09-24: qty 30

## 2021-09-24 MED ORDER — ROCURONIUM BROMIDE 10 MG/ML (PF) SYRINGE
PREFILLED_SYRINGE | INTRAVENOUS | Status: DC | PRN
  Administered 2021-09-24: 50 mg via INTRAVENOUS

## 2021-09-24 MED ORDER — METHOCARBAMOL 500 MG PO TABS: 500.0000 mg | ORAL_TABLET | Freq: Four times a day (QID) | ORAL | Status: DC | PRN

## 2021-09-24 MED ORDER — ONDANSETRON HCL 4 MG/2ML IJ SOLN
4.0000 mg | Freq: Once | INTRAMUSCULAR | Status: AC
  Administered 2021-09-24: 4 mg via INTRAVENOUS

## 2021-09-24 MED ORDER — LIDOCAINE 2% (20 MG/ML) 5 ML SYRINGE
INTRAMUSCULAR | Status: AC
  Filled 2021-09-24: qty 5

## 2021-09-24 MED ORDER — SUCCINYLCHOLINE CHLORIDE 200 MG/10ML IV SOSY
PREFILLED_SYRINGE | INTRAVENOUS | Status: DC | PRN
  Administered 2021-09-24: 100 mg via INTRAVENOUS

## 2021-09-24 MED ORDER — OXYCODONE HCL 5 MG/5ML PO SOLN: 5.0000 mg | Freq: Once | ORAL | Status: DC | PRN

## 2021-09-24 MED ORDER — MIDAZOLAM HCL 2 MG/2ML IJ SOLN
INTRAMUSCULAR | Status: DC | PRN
  Administered 2021-09-24: 2 mg via INTRAVENOUS

## 2021-09-24 MED ORDER — HEMOSTATIC AGENTS (NO CHARGE) OPTIME
TOPICAL | Status: DC | PRN
  Administered 2021-09-24: 1 via TOPICAL

## 2021-09-24 MED ORDER — SODIUM CHLORIDE 0.9 % IV SOLN
2.0000 g | INTRAVENOUS | Status: AC
  Administered 2021-09-24: 2 g via INTRAVENOUS
  Filled 2021-09-24: qty 20

## 2021-09-24 MED ORDER — ACETAMINOPHEN 325 MG PO TABS: 650.0000 mg | ORAL_TABLET | Freq: Four times a day (QID) | ORAL | Status: DC | PRN

## 2021-09-24 MED ORDER — MELATONIN 3 MG PO TABS: 3.0000 mg | ORAL_TABLET | Freq: Every evening | ORAL | Status: DC | PRN

## 2021-09-24 MED ORDER — AMOXICILLIN-POT CLAVULANATE 875-125 MG PO TABS: 1.0000 | ORAL_TABLET | Freq: Two times a day (BID) | ORAL | 0 refills | Status: AC

## 2021-09-24 MED ORDER — MORPHINE SULFATE (PF) 2 MG/ML IV SOLN: 2.0000 mg | INTRAVENOUS | Status: DC | PRN

## 2021-09-24 MED ORDER — ALUM & MAG HYDROXIDE-SIMETH 200-200-20 MG/5ML PO SUSP
30.0000 mL | Freq: Once | ORAL | Status: AC
  Administered 2021-09-24: 30 mL via ORAL
  Filled 2021-09-24: qty 30

## 2021-09-24 MED ORDER — MORPHINE SULFATE (PF) 4 MG/ML IV SOLN
4.0000 mg | Freq: Once | INTRAVENOUS | Status: AC
  Administered 2021-09-24: 4 mg via INTRAVENOUS
  Filled 2021-09-24: qty 1

## 2021-09-24 MED ORDER — FENTANYL CITRATE (PF) 100 MCG/2ML IJ SOLN: 25.0000 ug | INTRAMUSCULAR | Status: DC | PRN

## 2021-09-24 MED ORDER — MIDAZOLAM HCL 2 MG/2ML IJ SOLN
INTRAMUSCULAR | Status: AC
  Filled 2021-09-24: qty 2

## 2021-09-24 MED ORDER — 0.9 % SODIUM CHLORIDE (POUR BTL) OPTIME
TOPICAL | Status: DC | PRN
  Administered 2021-09-24: 1000 mL

## 2021-09-24 MED ORDER — DOCUSATE SODIUM 100 MG PO CAPS: 100.0000 mg | ORAL_CAPSULE | Freq: Two times a day (BID) | ORAL | Status: AC

## 2021-09-24 MED ORDER — DIPHENHYDRAMINE HCL 50 MG/ML IJ SOLN: 25.0000 mg | Freq: Four times a day (QID) | INTRAMUSCULAR | Status: DC | PRN

## 2021-09-24 MED ORDER — OXYCODONE HCL 5 MG PO TABS: 5.0000 mg | ORAL_TABLET | Freq: Once | ORAL | Status: DC | PRN

## 2021-09-24 MED ORDER — ACETAMINOPHEN 650 MG RE SUPP: 650.0000 mg | Freq: Four times a day (QID) | RECTAL | Status: DC | PRN

## 2021-09-24 MED ORDER — ACETAMINOPHEN 325 MG PO TABS: 650.0000 mg | ORAL_TABLET | Freq: Four times a day (QID) | ORAL | Status: AC | PRN

## 2021-09-24 MED ORDER — ROCURONIUM BROMIDE 10 MG/ML (PF) SYRINGE
PREFILLED_SYRINGE | INTRAVENOUS | Status: AC
  Filled 2021-09-24: qty 10

## 2021-09-24 MED ORDER — SUCCINYLCHOLINE CHLORIDE 200 MG/10ML IV SOSY
PREFILLED_SYRINGE | INTRAVENOUS | Status: AC
  Filled 2021-09-24: qty 10

## 2021-09-24 MED ORDER — PROPOFOL 10 MG/ML IV BOLUS
INTRAVENOUS | Status: DC | PRN
  Administered 2021-09-24: 200 mg via INTRAVENOUS

## 2021-09-24 MED ORDER — DIPHENHYDRAMINE HCL 25 MG PO CAPS: 25.0000 mg | ORAL_CAPSULE | Freq: Four times a day (QID) | ORAL | Status: DC | PRN

## 2021-09-24 MED ORDER — AMISULPRIDE (ANTIEMETIC) 5 MG/2ML IV SOLN: 10.0000 mg | Freq: Once | INTRAVENOUS | Status: DC | PRN

## 2021-09-24 MED ORDER — OXYCODONE HCL 5 MG PO TABS: 5.0000 mg | ORAL_TABLET | ORAL | Status: DC | PRN

## 2021-09-24 MED ORDER — SODIUM CHLORIDE 0.9 % IV BOLUS
1000.0000 mL | Freq: Once | INTRAVENOUS | Status: AC
  Administered 2021-09-24: 1000 mL via INTRAVENOUS

## 2021-09-24 MED ORDER — CHLORHEXIDINE GLUCONATE 0.12 % MT SOLN
OROMUCOSAL | Status: AC
  Administered 2021-09-24: 15 mL via OROMUCOSAL
  Filled 2021-09-24: qty 15

## 2021-09-24 MED ORDER — FENTANYL CITRATE (PF) 250 MCG/5ML IJ SOLN
INTRAMUSCULAR | Status: AC
  Filled 2021-09-24: qty 5

## 2021-09-24 MED ORDER — KETOROLAC TROMETHAMINE 30 MG/ML IJ SOLN
INTRAMUSCULAR | Status: AC
  Filled 2021-09-24: qty 1

## 2021-09-24 MED ORDER — BUPIVACAINE-EPINEPHRINE 0.25% -1:200000 IJ SOLN
INTRAMUSCULAR | Status: DC | PRN
  Administered 2021-09-24: 10 mL

## 2021-09-24 MED ORDER — POLYETHYLENE GLYCOL 3350 17 G PO PACK: 17.0000 g | PACK | Freq: Every day | ORAL | Status: DC | PRN

## 2021-09-24 MED ORDER — LACTATED RINGERS IV SOLN: INTRAVENOUS | Status: DC

## 2021-09-24 MED ORDER — LIDOCAINE VISCOUS HCL 2 % MT SOLN
15.0000 mL | Freq: Once | OROMUCOSAL | Status: AC
  Administered 2021-09-24: 15 mL via ORAL
  Filled 2021-09-24: qty 15

## 2021-09-24 SURGICAL SUPPLY — 42 items
APL PRP STRL LF DISP 70% ISPRP (MISCELLANEOUS) ×2
APL SKNCLS STERI-STRIP NONHPOA (GAUZE/BANDAGES/DRESSINGS) ×2
BAG COUNTER SPONGE SURGICOUNT (BAG) ×3 IMPLANT
BAG SPEC RTRVL 10 TROC 200 (ENDOMECHANICALS) ×2
BAG SPNG CNTER NS LX DISP (BAG) ×2
BENZOIN TINCTURE PRP APPL 2/3 (GAUZE/BANDAGES/DRESSINGS) ×3 IMPLANT
CANISTER SUCT 3000ML PPV (MISCELLANEOUS) ×3 IMPLANT
CHLORAPREP W/TINT 26 (MISCELLANEOUS) ×3 IMPLANT
CNTNR URN SCR LID CUP LEK RST (MISCELLANEOUS) ×1 IMPLANT
CONT SPEC 4OZ STRL OR WHT (MISCELLANEOUS) ×3
COVER MAYO STAND STRL (DRAPES) ×3 IMPLANT
COVER SURGICAL LIGHT HANDLE (MISCELLANEOUS) ×3 IMPLANT
DRSG TEGADERM 2-3/8X2-3/4 SM (GAUZE/BANDAGES/DRESSINGS) ×9 IMPLANT
DRSG TEGADERM 4X4.75 (GAUZE/BANDAGES/DRESSINGS) ×3 IMPLANT
ELECT REM PT RETURN 9FT ADLT (ELECTROSURGICAL) ×3
ELECTRODE REM PT RTRN 9FT ADLT (ELECTROSURGICAL) ×2 IMPLANT
GAUZE SPONGE 2X2 8PLY STRL LF (GAUZE/BANDAGES/DRESSINGS) ×2 IMPLANT
GLOVE SURG ENC MOIS LTX SZ7 (GLOVE) ×3 IMPLANT
GLOVE SURG UNDER POLY LF SZ7.5 (GLOVE) ×3 IMPLANT
GOWN STRL REUS W/ TWL LRG LVL3 (GOWN DISPOSABLE) ×6 IMPLANT
GOWN STRL REUS W/TWL LRG LVL3 (GOWN DISPOSABLE) ×9
HEMOSTAT SNOW SURGICEL 2X4 (HEMOSTASIS) ×3 IMPLANT
KIT BASIN OR (CUSTOM PROCEDURE TRAY) ×3 IMPLANT
KIT TURNOVER KIT B (KITS) ×3 IMPLANT
NS IRRIG 1000ML POUR BTL (IV SOLUTION) ×3 IMPLANT
PAD ARMBOARD 7.5X6 YLW CONV (MISCELLANEOUS) ×3 IMPLANT
POUCH RETRIEVAL ECOSAC 10 (ENDOMECHANICALS) ×1 IMPLANT
POUCH RETRIEVAL ECOSAC 10MM (ENDOMECHANICALS) ×3
SCISSORS LAP 5X35 DISP (ENDOMECHANICALS) ×3 IMPLANT
SET IRRIG TUBING LAPAROSCOPIC (IRRIGATION / IRRIGATOR) ×3 IMPLANT
SET TUBE SMOKE EVAC HIGH FLOW (TUBING) ×3 IMPLANT
SLEEVE ENDOPATH XCEL 5M (ENDOMECHANICALS) ×3 IMPLANT
SPONGE GAUZE 2X2 STER 10/PKG (GAUZE/BANDAGES/DRESSINGS) ×1
STRIP CLOSURE SKIN 1/2X4 (GAUZE/BANDAGES/DRESSINGS) ×3 IMPLANT
SUT MNCRL AB 4-0 PS2 18 (SUTURE) ×3 IMPLANT
TOWEL GREEN STERILE (TOWEL DISPOSABLE) ×3 IMPLANT
TOWEL GREEN STERILE FF (TOWEL DISPOSABLE) ×3 IMPLANT
TRAY LAPAROSCOPIC MC (CUSTOM PROCEDURE TRAY) ×3 IMPLANT
TROCAR XCEL BLUNT TIP 100MML (ENDOMECHANICALS) ×3 IMPLANT
TROCAR XCEL NON-BLD 11X100MML (ENDOMECHANICALS) ×3 IMPLANT
TROCAR XCEL NON-BLD 5MMX100MML (ENDOMECHANICALS) ×3 IMPLANT
WATER STERILE IRR 1000ML POUR (IV SOLUTION) ×3 IMPLANT

## 2021-09-24 NOTE — Anesthesia Postprocedure Evaluation (Signed)
Anesthesia Post Note  Patient: Designer, jewellery  Procedure(s) Performed: LAPAROSCOPIC CHOLECYSTECTOMY (Abdomen)     Patient location during evaluation: PACU Anesthesia Type: General Level of consciousness: awake and alert Pain management: pain level controlled Vital Signs Assessment: post-procedure vital signs reviewed and stable Respiratory status: spontaneous breathing, nonlabored ventilation and respiratory function stable Cardiovascular status: blood pressure returned to baseline and stable Postop Assessment: no apparent nausea or vomiting Anesthetic complications: no   No notable events documented.  Last Vitals:  Vitals:   09/24/21 1455 09/24/21 1510  BP: 124/77 119/71  Pulse: 87 92  Resp: (!) 23 17  Temp:    SpO2: 100% 98%    Last Pain:  Vitals:   09/24/21 1510  TempSrc:   PainSc: 0-No pain                 Lucretia Kern

## 2021-09-24 NOTE — H&P (Addendum)
Admission Note  Cathy Nash Dec 20, 1980  [id number].    Requesting MD: Eustaquio Maize, PA-C Chief Complaint/Reason for Consult: Acute cholecystitis   HPI:  Patient is a 40 year old female who presented to [facility] yesterday with abdominal pain, nausea, emesis x4. Pain is epigastric and started after dinner yesterday around 5 PM. Onset was gradual and pain is described as constant and sharp. She has had similar pain in July of this year and was noted to have cholelithiasis. She was referred to general surgery but does not appear that she has been seen yet. These are the only two episodes she has experienced. ROS otherwise as below.  Pain persists at 6/10 after dose of morphine. Nausea and emesis have improved.   She does not have significant past medical history otherwise. Occasional alcohol use. No tobacco use. History of cesarean section  ROS: Review of Systems  Constitutional:  Positive for chills. Negative for fever.  Respiratory:  Negative for cough, shortness of breath and wheezing.   Cardiovascular:  Negative for chest pain, palpitations and leg swelling.  Gastrointestinal:  Positive for abdominal pain, nausea and vomiting. Negative for constipation and diarrhea.  Genitourinary: Negative.    Family History  Problem Relation Age of Onset   Diabetes Mother    Asthma Father    Anesthesia problems Neg Hx    Hypotension Neg Hx    Malignant hyperthermia Neg Hx    Pseudochol deficiency Neg Hx    Other Neg Hx     Past Medical History:  Diagnosis Date   Anxiety    Asthma    Hemorrhoid    Pregnancy induced hypertension    previous pregnancy   Preterm labor    1st pregnancy d/t pre-e   Pyelonephritis    June 2013   Vaginal pessary in situ    Varicose veins     Past Surgical History:  Procedure Laterality Date   CESAREAN SECTION N/A 10/16/2016   Procedure: CESAREAN SECTION;  Surgeon: Aletha Halim, MD;  Location: [place];  Service: Obstetrics;   Laterality: N/A;    Social History:  reports that she has quit smoking. She has never used smokeless tobacco. She reports that she does not drink alcohol and does not use drugs.  Allergies:  Allergies  Allergen Reactions   Iohexol Hives and Shortness Of Breath     Code: SOB, Desc: IMMEDIATELY SOB FOLLOWING IV INJECTION, SPOTTY HIVES, PT GIVEN BENADRYL AND EPI-ARS 08/22/09, Onset Date: [id number]    Latex Itching and Rash    (Not in a hospital admission)   Blood pressure 132/79, pulse 66, temperature 98.3 F (36.8 C), temperature source Oral, resp. rate 15, SpO2 98 %. Physical Exam:  General: pleasant, WD, female who is laying in bed in NAD HEENT: head is normocephalic, atraumatic.  Sclera are noninjected.  Pupils equal and round.  Ears and nose without any masses or lesions.  Mouth is pink and moist Heart: regular, rate, and rhythm.  Normal s1,s2. No obvious murmurs, gallops, or rubs noted.  Palpable radial and pedal pulses bilaterally Lungs: CTAB, no wheezes, rhonchi, or rales noted.  Respiratory effort nonlabored Abd: soft, ND, +BS, mild to moderate TTP over epigastrium and RUQ without rebound. She does have voluntary guarding. No concerning s/s of peritonitis MS: all 4 extremities are symmetrical with no cyanosis, clubbing, or edema. Skin: warm and dry with no masses, lesions, or rashes Neuro: Cranial nerves 2-12 grossly intact, sensation is normal throughout Psych: A&Ox3 with  an appropriate affect.   Results for orders placed or performed during the hospital encounter of 09/23/21 (from the past 48 hour(s))  Urinalysis, Routine w reflex microscopic Urine, Clean Catch     Status: Abnormal   Collection Time: 09/23/21  7:42 PM  Result Value Ref Range   Color, Urine YELLOW YELLOW   APPearance CLOUDY (A) CLEAR   Specific Gravity, Urine 1.020 1.005 - 1.030   pH 6.0 5.0 - 8.0   Glucose, UA NEGATIVE NEGATIVE mg/dL   Hgb urine dipstick MODERATE (A) NEGATIVE   Bilirubin Urine NEGATIVE  NEGATIVE   Ketones, ur 20 (A) NEGATIVE mg/dL   Protein, ur 30 (A) NEGATIVE mg/dL   Nitrite NEGATIVE NEGATIVE   Leukocytes,Ua NEGATIVE NEGATIVE   RBC / HPF >50 (H) 0 - 5 RBC/hpf   WBC, UA 6-10 0 - 5 WBC/hpf   Bacteria, UA RARE (A) NONE SEEN   Squamous Epithelial / LPF 6-10 0 - 5    Comment: Performed at [facility], [address] [place], [place] [address]  Lipase, blood     Status: None   Collection Time: 09/23/21  7:50 PM  Result Value Ref Range   Lipase 32 11 - 51 U/L    Comment: Performed at [place] [address], [place], [place] [address]  Comprehensive metabolic panel     Status: Abnormal   Collection Time: 09/23/21  7:50 PM  Result Value Ref Range   Sodium 137 135 - 145 mmol/L   Potassium 3.3 (L) 3.5 - 5.1 mmol/L   Chloride 100 98 - 111 mmol/L   CO2 26 22 - 32 mmol/L   Glucose, Bld 138 (H) 70 - 99 mg/dL    Comment: Glucose reference range applies only to samples taken after fasting for at least 8 hours.   BUN 11 6 - 20 mg/dL   Creatinine, Ser 0.59 0.44 - 1.00 mg/dL   Calcium 9.3 8.9 - 10.3 mg/dL   Total Protein 6.9 6.5 - 8.1 g/dL   Albumin 3.6 3.5 - 5.0 g/dL   AST 58 (H) 15 - 41 U/L   ALT 51 (H) 0 - 44 U/L   Alkaline Phosphatase 146 (H) 38 - 126 U/L   Total Bilirubin 0.5 0.3 - 1.2 mg/dL   GFR, Estimated >60 >60 mL/min    Comment: (NOTE) Calculated using the CKD-EPI Creatinine Equation (2021)    Anion gap 11 5 - 15    Comment: Performed at [facility] [address], [place], [place] [address]  CBC     Status: Abnormal   Collection Time: 09/23/21  7:50 PM  Result Value Ref Range   WBC 10.3 4.0 - 10.5 K/uL   RBC 3.91 3.87 - 5.11 MIL/uL   Hemoglobin 11.5 (L) 12.0 - 15.0 g/dL   HCT 35.4 (L) 36.0 - 46.0 %   MCV 90.5 80.0 - 100.0 fL   MCH 29.4 26.0 - 34.0 pg   MCHC 32.5 30.0 - 36.0 g/dL   RDW 14.5 11.5 - 15.5 %   Platelets 351 150 - 400 K/uL   nRBC 0.0 0.0 - 0.2 %    Comment: Performed at [facility], [place] [address]  [place], [place] [address]  I-Stat beta hCG blood, ED     Status: Abnormal   Collection Time: 09/23/21 10:20 PM  Result Value Ref Range   I-stat hCG, quantitative 7.5 (H) <5 mIU/mL   Comment 3  Comment:   GEST. AGE      CONC.  (mIU/mL)   <=1 WEEK        5 - 50     2 WEEKS       50 - 500     3 WEEKS       100 - 10,000     4 WEEKS     1,000 - 30,000        FEMALE AND NON-PREGNANT FEMALE:     LESS THAN 5 mIU/mL   hCG, quantitative, pregnancy     Status: None   Collection Time: 09/24/21  8:29 AM  Result Value Ref Range   hCG, Beta Chain, Quant, S <1 <5 mIU/mL    Comment:          GEST. AGE      CONC.  (mIU/mL)   <=1 WEEK        5 - 50     2 WEEKS       50 - 500     3 WEEKS       100 - 10,000     4 WEEKS     1,000 - 30,000     5 WEEKS     3,500 - 115,000   6-8 WEEKS     12,000 - 270,000    12 WEEKS     15,000 - 220,000        FEMALE AND NON-PREGNANT FEMALE:     LESS THAN 5 mIU/mL Performed at [facility], [place] [address], [place], [place] [address]    US Abdomen Limited RUQ (LIVER/GB)  Result Date: 09/24/2021 CLINICAL DATA:  Epigastric abdominal pain EXAM: ULTRASOUND ABDOMEN LIMITED RIGHT UPPER QUADRANT COMPARISON:  07/04/2021 abdominal CT FINDINGS: Gallbladder: Gallbladder sludge and calculi. Wall thickness is 8 mm and there is focal tenderness per sonographer exam Common bile duct: Diameter: 7 mm. Where visualized, no filling defect. Only the hilar CBD could be visualized. Liver: No focal lesion identified. Within normal limits in parenchymal echogenicity. Portal vein is patent on color Doppler imaging with normal direction of blood flow towards the liver. IMPRESSION: 1. Cholelithiasis, gallbladder wall thickening, and focal tenderness suggesting acute cholecystitis. 2. Mildly dilated common bile duct (a change since abdominal CT 07/04/2021) without visualized choledocholithiasis. Electronically Signed   By: Jorje Guild M.D.   On: 09/24/2021 09:40       Assessment/Plan Probable Acute Cholecystitis  - RUQ [place] today with cholelithiasis and gallbladder wall thickening  - WBC 10, afebrile - AST/ALT 58/51, Tbili 0.5, Alk Phos 146 yesterday - repeat labs this AM - COVID pending  - plan for laparoscopic cholecystectomy this afternoon.   I have explained the procedure, risks, and aftercare of cholecystectomy.  Risks include but are not limited to bleeding, infection, wound problems, anesthesia, diarrhea, bile leak, injury to common bile duct/liver/intestine.  She seems to understand and agrees to proceed.  FEN: NPO ID: [email or web address] [email or web address] VTE: none currently  Due to language barrier, an interpreter was present during the history-taking and subsequent discussion (and for part of the physical exam) with this patient.   Richard Miu, [facility] 09/24/2021, 10:41 AM Please see Amion for pager number during day hours 7:00am-4:30pm

## 2021-09-24 NOTE — Discharge Instructions (Signed)
CIRUGIA LAPAROSCOPICA: INSTRUCCIONES DE POST OPERATORIO.  Revise siempre los documentos que le entreguen en el lugar donde se ha hecho la cirugia.  SI USTED NECESITA DOCUMENTOS DE INCAPACIDAD (DISABLE) O DE PERMISO FAMILAR (FAMILY LEAVE) NECESITA TRAERLOS A LA OFICINA PARA QUE SEAN PROCESADOS. NO  SE LOS DE A SU DOCTOR. A su alta del hospital se le dara una receta para controlar el dolor. Tomela como ha sido recetada, si la necesita. Si no la necesita puede tomar, Acetaminofen (Tylenol) o Ibuprofen (Advil) para aliviar dolor moderado. Continue tomando el resto de sus medicinas. Si necesita rellenar la receta, llame a la farmacia. ellos contactan a nuestra oficina pidiendo autorizacion. Este tipo de receta no pueden ser rellenadas despues de las  5pm o durante los fines de semana. Con relacion a la dieta: debe ser ligera los primeros dias despues que llege a la casa. Ejemplo: sopas y galleticas. Tome bastante liquido esos dias. La mayoria de los pacientes padecen de inflamacion y cambio de coloracion de la piel alrededor de las incisiones. esto toma dias en resolver.  pnerse una bolsa de hielo en el area affectada ayuda..  Es comun tambien tener un poco de estrenimiento si esta tomado medicinas para el dolor. incremente la cantidad de liquidos a tomar y puede tomar (Colace) esto previene el problema. Si ya tiene estrenimiento, es decir no ha defecado en 48 horas, puede tomar un laxativo (Milk of Magnesia or Miralax) uselo como el paquete le explica.  A menos que se le diga algo diferente. Remueva el bendaje a las 24-48 horas despues dela cirugia. y puede banarse en la ducha sin ningun problema. usted puede tener steri-strips (pequenas curitas transparentes en la piel puesta encima de la incision)  Estas banditas strips should be left on the skin for 7-10 days.   Si su cirujano puso pegamento encima de la incision usted puede banarse bajo la ducha en 24 horas. Este pegamento empezara a caerse en las  proximas 2-3 semanas. Si le pusieron suturas o presillas (grapos) estos seran quitados en su proxima cita en la oficina. . ACTIVIDADES:  Puede hacer actividad ligera.  Como caminar , subir escaleras y poco a poco irlas incrementando tanto como las tolere. Puede tener relaciones sexuales cuando sea comfortable. No carge objetos pesados o haga esfuerzos que no sean aprovados por su doctor. Puede manejar en cuanto no esta tomando medicamentos fuertes (narcoticos) para el dolor, pueda abrochar confortablemente el cinturon de seguridad, y pueda maniobrar y usar los pedales de su vehiculo con seguridad. PUEDE REGRESAR A TRABAJAR  Debe ver a su doctor para una cita de seguimiento en 2-3 semanas despues de la cirugia.  OTRAS ISNSTRUCCIONES:___________________________________________________________________________________ CUANDO LLAMAR A SU MEDICO: FIEBRE mayor de  101.0 No produccion de orina. Sangramiento continue de la herida Incremento de dolor, enrojecimientio o drenaje de la herida (incision) Incremento de dolor abdominal.  The clinic staff is available to answer your questions during regular business hours.  Please don't hesitate to call and ask to speak to one of the nurses for clinical concerns.  If you have a medical emergency, go to the nearest emergency room or call 911.  A surgeon from [facility] is always on call at the hospital. [address], [address], [place], [place]  [address] ? [address] [address], , [place]   [address] [phone] ? [phone] ? FAX [phone] Web site: [email or web address]    Managing Your Pain After Surgery Without Opioids    Thank you for participating in   our program to help patients manage their pain after surgery without opioids. This is part of our effort to provide you with the best care possible, without exposing you or your family to the risk that opioids pose.  What pain can I expect after surgery? You can expect  to have some pain after surgery. This is normal. The pain is typically worse the day after surgery, and quickly begins to get better. Many studies have found that many patients are able to manage their pain after surgery with Over-the-Counter (OTC) medications such as Tylenol and Motrin. If you have a condition that does not allow you to take Tylenol or Motrin, notify your surgical team.  How will I manage my pain? The best strategy for controlling your pain after surgery is around the clock pain control with Tylenol (acetaminophen) and Motrin (ibuprofen or Advil). Alternating these medications with each other allows you to maximize your pain control. In addition to Tylenol and Motrin, you can use heating pads or ice packs on your incisions to help reduce your pain.  How will I alternate your regular strength over-the-counter pain medication? You will take a dose of pain medication every three hours. Start by taking 650 mg of Tylenol (2 pills of 325 mg) 3 hours later take 600 mg of Motrin (3 pills of 200 mg) 3 hours after taking the Motrin take 650 mg of Tylenol 3 hours after that take 600 mg of Motrin.   - 1 -  See example - if your first dose of Tylenol is at 12:00 PM   12:00 PM Tylenol 650 mg (2 pills of 325 mg)  3:00 PM Motrin 600 mg (3 pills of 200 mg)  6:00 PM Tylenol 650 mg (2 pills of 325 mg)  9:00 PM Motrin 600 mg (3 pills of 200 mg)  Continue alternating every 3 hours   We recommend that you follow this schedule around-the-clock for at least 3 days after surgery, or until you feel that it is no longer needed. Use the table on the last page of this handout to keep track of the medications you are taking. Important: Do not take more than 3000mg of Tylenol or 3200mg of Motrin in a 24-hour period. Do not take ibuprofen/Motrin if you have a history of bleeding stomach ulcers, severe kidney disease, &/or actively taking a blood thinner  What if I still have pain? If you have pain  that is not controlled with the over-the-counter pain medications (Tylenol and Motrin or Advil) you might have what we call "breakthrough" pain. You will receive a prescription for a small amount of an opioid pain medication such as Oxycodone, Tramadol, or Tylenol with Codeine. Use these opioid pills in the first 24 hours after surgery if you have breakthrough pain. Do not take more than 1 pill every 4-6 hours.  If you still have uncontrolled pain after using all opioid pills, don't hesitate to call our staff using the number provided. We will help make sure you are managing your pain in the best way possible, and if necessary, we can provide a prescription for additional pain medication.   Day 1    Time  Name of Medication Number of pills taken  Amount of Acetaminophen  Pain Level   Comments  AM PM       AM PM       AM PM       AM PM       AM PM         AM PM       AM PM       AM PM       Total Daily amount of Acetaminophen Do not take more than  3,000 mg per day      Day 2    Time  Name of Medication Number of pills taken  Amount of Acetaminophen  Pain Level   Comments  AM PM       AM PM       AM PM       AM PM       AM PM       AM PM       AM PM       AM PM       Total Daily amount of Acetaminophen Do not take more than  3,000 mg per day      Day 3    Time  Name of Medication Number of pills taken  Amount of Acetaminophen  Pain Level   Comments  AM PM       AM PM       AM PM       AM PM          AM PM       AM PM       AM PM       AM PM       Total Daily amount of Acetaminophen Do not take more than  3,000 mg per day      Day 4    Time  Name of Medication Number of pills taken  Amount of Acetaminophen  Pain Level   Comments  AM PM       AM PM       AM PM       AM PM       AM PM       AM PM       AM PM       AM PM       Total Daily amount of Acetaminophen Do not take more than  3,000 mg per day      Day 5    Time  Name of  Medication Number of pills taken  Amount of Acetaminophen  Pain Level   Comments  AM PM       AM PM       AM PM       AM PM       AM PM       AM PM       AM PM       AM PM       Total Daily amount of Acetaminophen Do not take more than  3,000 mg per day       Day 6    Time  Name of Medication Number of pills taken  Amount of Acetaminophen  Pain Level  Comments  AM PM       AM PM       AM PM       AM PM       AM PM       AM PM       AM PM       AM PM       Total Daily amount of Acetaminophen Do not take more than  3,000 mg per day      Day 7    Time    Name of Medication Number of pills taken  Amount of Acetaminophen  Pain Level   Comments  AM PM       AM PM       AM PM       AM PM       AM PM       AM PM       AM PM       AM PM       Total Daily amount of Acetaminophen Do not take more than  3,000 mg per day        For additional information about how and where to safely dispose of unused opioid medications - https://www.morepowerfulnc.org  Disclaimer: This document contains information and/or instructional materials adapted from [facility] for the typical patient with your condition. It does not replace medical advice from your health care provider because your experience may differ from that of the typical patient. Talk to your health care provider if you have any questions about this document, your condition or your treatment plan. Adapted from [facility]  

## 2021-09-24 NOTE — Anesthesia Procedure Notes (Signed)
Procedure Name: Intubation Date/Time: 09/24/2021 1:35 PM Performed by: Marena Chancy, CRNA Pre-anesthesia Checklist: Patient identified, Emergency Drugs available, Suction available and Patient being monitored Patient Re-evaluated:Patient Re-evaluated prior to induction Oxygen Delivery Method: Circle System Utilized Preoxygenation: Pre-oxygenation with 100% oxygen Induction Type: IV induction Ventilation: Mask ventilation without difficulty Laryngoscope Size: Miller and 2 Grade View: Grade I Tube type: Oral Tube size: 7.0 mm Number of attempts: 1 Airway Equipment and Method: Stylet and Oral airway Placement Confirmation: ETT inserted through vocal cords under direct vision, positive ETCO2 and breath sounds checked- equal and bilateral Tube secured with: Tape Dental Injury: Teeth and Oropharynx as per pre-operative assessment

## 2021-09-24 NOTE — Op Note (Signed)
Laparoscopic Cholecystectomy Procedure Note  Indications: This patient presents with acute calculus cholecystitis and will undergo laparoscopic cholecystectomy.  Liver function tests are normal and common bile duct appears normal.  She has a contrast allergy so we are forgoing intraoperative cholangiogram.  Pre-operative Diagnosis: Calculus of gallbladder with acute cholecystitis, without mention of obstruction  Post-operative Diagnosis: Same  Surgeon: Wynona Luna   Assistants: none  Anesthesia: General endotracheal anesthesia  ASA Class: 2  Procedure Details  The patient was seen again in the Holding Room. The risks, benefits, complications, treatment options, and expected outcomes were discussed with the patient. The possibilities of reaction to medication, pulmonary aspiration, perforation of viscus, bleeding, recurrent infection, finding a normal gallbladder, the need for additional procedures, failure to diagnose a condition, the possible need to convert to an open procedure, and creating a complication requiring transfusion or operation were discussed with the patient. The likelihood of improving the patient's symptoms with return to their baseline status is good.  The patient and/or family concurred with the proposed plan, giving informed consent. The site of surgery properly noted. The patient was taken to Operating Room, identified as [employer] and the procedure verified as Laparoscopic Cholecystectomy with Intraoperative Cholangiogram. A Time Out was held and the above information confirmed.  Prior to the induction of general anesthesia, antibiotic prophylaxis was administered. General endotracheal anesthesia was then administered and tolerated well. After the induction, the abdomen was prepped with Chloraprep and draped in sterile fashion. The patient was positioned in the supine position.  Local anesthetic agent was injected into the skin near the umbilicus and an  incision made. We dissected down to the abdominal fascia with blunt dissection.  The fascia was incised vertically and we entered the peritoneal cavity bluntly.  A pursestring suture of 0-Vicryl was placed around the fascial opening.  The Hasson cannula was inserted and secured with the stay suture.  Pneumoperitoneum was then created with CO2 and tolerated well without any adverse changes in the patient's vital signs. An 11-mm port was placed in the subxiphoid position.  Two 5-mm ports were placed in the right upper quadrant. All skin incisions were infiltrated with a local anesthetic agent before making the incision and placing the trocars.   We positioned the patient in reverse Trendelenburg, tilted slightly to the patient's left.  The gallbladder was identified and was very edematous, thickened, and inflamed.  We decompressed the gallbladder with the suction aspirator.  The fundus was grasped and retracted cephalad. Adhesions were lysed bluntly and with the electrocautery where indicated, taking care not to injure any adjacent organs or viscus. The infundibulum was grasped and retracted laterally, exposing the peritoneum overlying the triangle of Calot. This was then divided and exposed in a blunt fashion. The cystic duct was clearly identified and bluntly dissected circumferentially. A critical view of the cystic duct and cystic artery was obtained.  The cystic duct was then ligated with clips and divided. The cystic artery was, dissected free, ligated with clips and divided as well.   The gallbladder was dissected from the liver bed in retrograde fashion with the electrocautery. The gallbladder was removed and placed in an Eco sac. The liver bed was irrigated and inspected. Hemostasis was achieved with the electrocautery and SNOW. Copious irrigation was utilized and was repeatedly aspirated until clear.  The gallbladder and Eco sac were then removed through the umbilical port site.  The pursestring suture  was used to close the umbilical fascia.    We again  inspected the right upper quadrant for hemostasis.  Pneumoperitoneum was released as we removed the trocars.  4-0 Monocryl was used to close the skin.   Benzoin, steri-strips, and clean dressings were applied. The patient was then extubated and brought to the recovery room in stable condition. Instrument, sponge, and needle counts were correct at closure and at the conclusion of the case.   Findings: Cholecystitis with Cholelithiasis  Estimated Blood Loss: Less than 50 ml         Drains: none         Specimens: Gallbladder           Complications: None; patient tolerated the procedure well.         Disposition: PACU - hemodynamically stable.         Condition: stable  Wilmon Arms. Corliss Skains, MD, [facility] Surgery  General Surgery   09/24/2021 2:28 PM

## 2021-09-24 NOTE — ED Notes (Signed)
Report given to [facility], RN of Short Stay [place] [facility]

## 2021-09-24 NOTE — Transfer of Care (Signed)
Immediate Anesthesia Transfer of Care Note  Patient: Cathy Nash  Procedure(s) Performed: LAPAROSCOPIC CHOLECYSTECTOMY (Abdomen)  Patient Location: PACU  Anesthesia Type:General  Level of Consciousness: awake and alert   Airway & Oxygen Therapy: Patient Spontanous Breathing and Patient connected to nasal cannula oxygen  Post-op Assessment: Report given to RN and Post -op Vital signs reviewed and stable  Post vital signs: Reviewed and stable  Last Vitals:  Vitals Value Taken Time  BP 121/74 09/24/21 1438  Temp    Pulse 91 09/24/21 1443  Resp 15 09/24/21 1443  SpO2 100 % 09/24/21 1443  Vitals shown include unvalidated device data.  Last Pain:  Vitals:   09/24/21 1159  TempSrc:   PainSc: 10-Worst pain ever         Complications: No notable events documented.

## 2021-09-24 NOTE — ED Notes (Signed)
Report given to Alana B, RN of ED 50 

## 2021-09-24 NOTE — ED Provider Notes (Signed)
[facility] EMERGENCY DEPARTMENT Provider Note   CSN: [id number] Arrival date & time: 09/23/21  1918     History Chief Complaint  Patient presents with   Abdominal Pain    Cathy Nash is a 40 y.o. female who presents to the ED today with complaint of gradual onset, constant, sharp, epigastric abdominal pain that began last night after eating dinner. Pt reports she ate eggs. She also complains of chills, nausea, and vomiting.  Chart review patient was seen in the ED on 07/24 for similar epigastric pain.  It was noted that she was having flank pain at that time as well.  Her symptoms resolved after a GI cocktail however she underwent a CT renal stone study without any findings of kidney stones.  She did have evidence of cholelithiasis.  She was advised to follow-up with general surgery which it does not appear she has done.  Denies any fevers at home.  No diarrhea.  No other complaints at this time. Pshx includes cesarean section.   The history is provided by the patient and medical records.      Past Medical History:  Diagnosis Date   Anxiety    Asthma    Hemorrhoid    Pregnancy induced hypertension    previous pregnancy   Preterm labor    1st pregnancy d/t pre-e   Pyelonephritis    June 2013   Vaginal pessary in situ    Varicose veins     Patient Active Problem List   Diagnosis Date Noted   History of preterm premature rupture of membranes (PROM) in previous pregnancy 07/11/2016   History of pre-eclampsia  07/11/2016   Language barrier 07/11/2016   Asthma affecting pregnancy, antepartum 06/01/2015   Rectocele, grade 3 03/05/2015    Past Surgical History:  Procedure Laterality Date   CESAREAN SECTION N/A 10/16/2016   Procedure: CESAREAN SECTION;  Surgeon: Aletha Halim, MD;  Location: [facility];  Service: Obstetrics;  Laterality: N/A;     OB History     Gravida  5   Para  5   Term  3   Preterm  2   AB  0   Living  5       SAB  0   IAB  0   Ectopic  0   Multiple  0   Live Births  5           Family History  Problem Relation Age of Onset   Diabetes Mother    Asthma Father    Anesthesia problems Neg Hx    Hypotension Neg Hx    Malignant hyperthermia Neg Hx    Pseudochol deficiency Neg Hx    Other Neg Hx     Social History   Tobacco Use   Smoking status: Former   Smokeless tobacco: Never   Tobacco comments:    yrs ago  Substance Use Topics   Alcohol use: No    Alcohol/week: 0.0 standard drinks   Drug use: No    Home Medications Prior to Admission medications   Medication Sig Start Date End Date Taking? Authorizing Provider  albuterol (VENTOLIN HFA) 108 (90 Base) MCG/ACT inhaler Inhale 2 puffs into the lungs every 6 (six) hours as needed for wheezing or shortness of breath. 03/02/20   Fulp, Cammie, MD  ibuprofen (ADVIL,MOTRIN) 600 MG tablet Take 1 tablet (600 mg total) by mouth 3 (three) times daily. 10/26/16   Joy, Shawn C, PA-C  montelukast (SINGULAIR) 10  MG tablet Take 1 tablet (10 mg total) by mouth at bedtime. 03/02/20   Fulp, Cammie, MD  omeprazole (PRILOSEC) 20 MG capsule Take 1 capsule (20 mg total) by mouth daily. 07/04/21   Maudie Flakes, MD  sucralfate (CARAFATE) 1 g tablet Take 1 tablet (1 g total) by mouth 4 (four) times daily as needed. 07/04/21   Maudie Flakes, MD    Allergies    Iohexol and Latex  Review of Systems   Review of Systems  Constitutional:  Positive for chills. Negative for appetite change and fever.  Cardiovascular:  Negative for chest pain.  Gastrointestinal:  Positive for abdominal pain, nausea and vomiting. Negative for constipation and diarrhea.  Genitourinary:  Negative for dysuria and flank pain.  All other systems reviewed and are negative.  Physical Exam Updated Vital Signs BP 132/79   Pulse 66   Temp 98.3 F (36.8 C) (Oral)   Resp 15   SpO2 98%   Physical Exam Vitals and nursing note reviewed.  Constitutional:       Appearance: She is not ill-appearing or diaphoretic.  HENT:     Head: Normocephalic and atraumatic.  Eyes:     Conjunctiva/sclera: Conjunctivae normal.  Cardiovascular:     Rate and Rhythm: Normal rate and regular rhythm.     Heart sounds: Normal heart sounds.  Pulmonary:     Effort: Pulmonary effort is normal.     Breath sounds: Normal breath sounds. No wheezing, rhonchi or rales.  Abdominal:     Palpations: Abdomen is soft.     Tenderness: There is abdominal tenderness in the epigastric area. There is no guarding or rebound.  Musculoskeletal:     Cervical back: Neck supple.  Skin:    General: Skin is warm and dry.  Neurological:     Mental Status: She is alert.    ED Results / Procedures / Treatments   Labs (all labs ordered are listed, but only abnormal results are displayed) Labs Reviewed  COMPREHENSIVE METABOLIC PANEL - Abnormal; Notable for the following components:      Result Value   Potassium 3.3 (*)    Glucose, Bld 138 (*)    AST 58 (*)    ALT 51 (*)    Alkaline Phosphatase 146 (*)    All other components within normal limits  CBC - Abnormal; Notable for the following components:   Hemoglobin 11.5 (*)    HCT 35.4 (*)    All other components within normal limits  URINALYSIS, ROUTINE W REFLEX MICROSCOPIC - Abnormal; Notable for the following components:   APPearance CLOUDY (*)    Hgb urine dipstick MODERATE (*)    Ketones, ur 20 (*)    Protein, ur 30 (*)    RBC / HPF >50 (*)    Bacteria, UA RARE (*)    All other components within normal limits  I-STAT BETA HCG BLOOD, ED ([facility], [facility], [facility] ONLY) - Abnormal; Notable for the following components:   I-stat hCG, quantitative 7.5 (*)    All other components within normal limits  RESP PANEL BY RT-PCR (FLU A&B, COVID) ARPGX2  LIPASE, BLOOD  HCG, QUANTITATIVE, PREGNANCY  COMPREHENSIVE METABOLIC PANEL  CBC    EKG None  Radiology US Abdomen Limited RUQ (LIVER/GB)  Result Date: 09/24/2021 CLINICAL DATA:  Epigastric  abdominal pain EXAM: ULTRASOUND ABDOMEN LIMITED RIGHT UPPER QUADRANT COMPARISON:  07/04/2021 abdominal CT FINDINGS: Gallbladder: Gallbladder sludge and calculi. Wall thickness is 8 mm and there is focal tenderness per  sonographer exam Common bile duct: Diameter: 7 mm. Where visualized, no filling defect. Only the hilar CBD could be visualized. Liver: No focal lesion identified. Within normal limits in parenchymal echogenicity. Portal vein is patent on color Doppler imaging with normal direction of blood flow towards the liver. IMPRESSION: 1. Cholelithiasis, gallbladder wall thickening, and focal tenderness suggesting acute cholecystitis. 2. Mildly dilated common bile duct (a change since abdominal CT 07/04/2021) without visualized choledocholithiasis. Electronically Signed   By: Jorje Guild M.D.   On: 09/24/2021 09:40    Procedures Procedures   Medications Ordered in ED Medications  ondansetron (ZOFRAN-ODT) disintegrating tablet 4 mg (4 mg Oral Given 09/23/21 1949)  sodium chloride 0.9 % bolus 1,000 mL (0 mLs Intravenous Stopped 09/24/21 1023)  ondansetron (ZOFRAN) injection 4 mg (4 mg Intravenous Given 09/24/21 0844)  alum & mag hydroxide-simeth (MAALOX/MYLANTA) 200-200-20 MG/5ML suspension 30 mL (30 mLs Oral Given 09/24/21 0844)    And  lidocaine (XYLOCAINE) 2 % viscous mouth solution 15 mL (15 mLs Oral Given 09/24/21 0844)  morphine 4 MG/ML injection 4 mg (4 mg Intravenous Given 09/24/21 1008)    ED Course  I have reviewed the triage vital signs and the nursing notes.  Pertinent labs & imaging results that were available during my care of the patient were reviewed by me and considered in my medical decision making (see chart for details).    MDM Rules/Calculators/A&P                           40 year old female presents to the ED today with complaint of epigastric pain, nausea, vomiting.  Able to the vitals are stable.  Patient was medically screened and work-up started.  CBC has  returned without a leukocytosis.  Hemoglobin 11.5 does appear to be slightly similar to previous at 12.0.  Currently on her menses.  Suspect this is likely the slight drop in hemoglobin.  CMP with a potassium of 3.3.  Will replete.  AST, ALT, alk phos all slightly elevated at 58, 51, 146.  T bili within normal limits at 0.5.  Lipase 32.  Beta-hCG has returned slightly elevated at 7.5, again patient is currently on her menstrual cycle.  We will plan for quant as I do not suspect patient is actually pregnant.  Urinalysis does show large amount of hemoglobin again likely secondary to vaginal bleeding.  On exam she has epigastric abdominal tenderness palpation without rebound or guarding.  No significant right upper quadrant tenderness.  It does appear per chart review that she has known cholelithiasis seen on CT scan in July of this year.  We will plan for right upper quadrant ultrasound at this time as I suspect gallbladder is likely the cause of patient's pain today.  Will provide GI cocktail at this time however.  We will hold off on significant pain medication pending hCG quant.   Hcg quant < 1.  Ultrasound: IMPRESSION:  1. Cholelithiasis, gallbladder wall thickening, and focal tenderness  suggesting acute cholecystitis.  2. Mildly dilated common bile duct (a change since abdominal CT  07/04/2021) without visualized choledocholithiasis.   On reeval pt reports mild improvement with GI cocktail. Morphine given for pain. Will consult general surgery at this time.   Discussed case with Richard Miu PA-C with gen surgery; will come evaluate patient.   Pt to be taken to the OR this afternoon for surgery.   This note was prepared using Systems analyst and may include  unintentional dictation errors due to the inherent limitations of voice recognition software.   Final Clinical Impression(s) / ED Diagnoses Final diagnoses:  Epigastric abdominal pain  Acute cholecystitis    Rx /  DC Orders ED Discharge Orders     None        Eustaquio Maize, PA-C 09/24/21 1101    Fredia Sorrow, MD 09/25/21 1510

## 2021-09-24 NOTE — Anesthesia Preprocedure Evaluation (Signed)
Anesthesia Evaluation  Patient identified by MRN, date of birth, ID band Patient awake    Reviewed: Allergy & Precautions, NPO status , Patient's Chart, lab work & pertinent test results  History of Anesthesia Complications Negative for: history of anesthetic complications  Airway Mallampati: II  TM Distance: >3 FB Neck ROM: Full    Dental  (+) Teeth Intact, Dental Advisory Given   Pulmonary asthma , former smoker,    Pulmonary exam normal        Cardiovascular negative cardio ROS Normal cardiovascular exam     Neuro/Psych negative neurological ROS     GI/Hepatic negative GI ROS, Neg liver ROS,   Endo/Other  negative endocrine ROS  Renal/GU negative Renal ROS  negative genitourinary   Musculoskeletal negative musculoskeletal ROS (+)   Abdominal   Peds  Hematology negative hematology ROS (+)   Anesthesia Other Findings   Reproductive/Obstetrics                             Anesthesia Physical Anesthesia Plan  ASA: 2  Anesthesia Plan: General   Post-op Pain Management:    Induction: Intravenous and Rapid sequence  PONV Risk Score and Plan: 4 or greater and Ondansetron, Dexamethasone, Treatment may vary due to age or medical condition and Midazolam  Airway Management Planned: Oral ETT  Additional Equipment: None  Intra-op Plan:   Post-operative Plan: Extubation in OR  Informed Consent: I have reviewed the patients History and Physical, chart, labs and discussed the procedure including the risks, benefits and alternatives for the proposed anesthesia with the patient or authorized representative who has indicated his/her understanding and acceptance.     Dental advisory given  Plan Discussed with:   Anesthesia Plan Comments:         Anesthesia Quick Evaluation

## 2021-09-25 ENCOUNTER — Encounter (HOSPITAL_COMMUNITY): Payer: Self-pay | Admitting: Surgery

## 2021-09-27 LAB — SURGICAL PATHOLOGY

## 2021-09-28 NOTE — Discharge Summary (Signed)
[facility] Discharge Summary   Patient ID: Cathy Nash MRN: [id number] DOB/AGE: 06/29/1981 40 y.o.  Admit date: 09/23/2021 Discharge date: 09/28/2021  Admitting Diagnosis: Acute cholecystitis [K81.0] Epigastric abdominal pain [R10.13] Symptomatic cholelithiasis [K80.20]   Discharge Diagnosis Acute cholecystitis s/p laparoscopic cholecystectomy  Consultants None  Imaging: No results found.  Procedures Dr. Corliss Skains (09/24/21) - Laparoscopic Cholecystectomy   Hospital Course:  40 year old female who presented to the [facility] Emergency Department with right upper quadrant abdominal pain, nausea, emesis.  Workup showed acute cholecystitis.  Patient was admitted and underwent procedure listed above.  Tolerated procedure well and recovery was monitored in PACU. On date of procedure, the patient was recovering well, ambulating well, pain well controlled, vital signs stable, incisions c/d/i and felt stable for discharge home.  Patient will follow up in our office in 3 weeks and knows to call with questions or concerns.  She will call to confirm appointment date/time.    She will be discharged on augmentin post operatively and oxycodone for pain control.  I or a member of my team have reviewed this patient in the Controlled Substance Database.   Allergies as of 09/24/2021       Reactions   Iohexol Hives, Shortness Of Breath    Code: SOB, Desc: IMMEDIATELY SOB FOLLOWING IV INJECTION, SPOTTY HIVES, PT GIVEN BENADRYL AND EPI-ARS 08/22/09, Onset Date: [id number]   Latex Itching, Rash        Medication List     TAKE these medications    acetaminophen 325 MG tablet Commonly known as: TYLENOL Take 2 tablets (650 mg total) by mouth every 6 (six) hours as needed for up to 7 days for mild pain.   amoxicillin-clavulanate 875-125 MG tablet Commonly known as: Augmentin Take 1 tablet by mouth 2 (two) times daily for 5 days.   docusate sodium 100 MG  capsule Commonly known as: COLACE Take 1 capsule (100 mg total) by mouth 2 (two) times daily for 5 days.   multivitamin tablet Take 1 tablet by mouth daily.   omeprazole 20 MG capsule Commonly known as: PRILOSEC Take 1 capsule (20 mg total) by mouth daily.   oxyCODONE 5 MG immediate release tablet Commonly known as: Oxy IR/ROXICODONE Take 1 tablet (5 mg total) by mouth every 6 (six) hours as needed for moderate pain, severe pain or breakthrough pain.   sucralfate 1 g tablet Commonly known as: Carafate Take 1 tablet (1 g total) by mouth 4 (four) times daily as needed.          Follow-up Information     [facility], [facility]. Call in 3 week(s).   Specialty: General Surgery Why: We are working to make your follow up appointment for you. Please call to confirm appointment date and time  Estamos trabajando para hacer su cita de seguimiento para usted. Por favor llame para confirmar la fecha y hora de la cita Contact information: [address] [place] [address] [phone]                 Signed: Clarise Cruz [facility] 09/28/2021, 8:32 AM Please see Amion for pager number during day hours 7:00am-4:30pm

## 2021-11-26 ENCOUNTER — Encounter (INDEPENDENT_AMBULATORY_CARE_PROVIDER_SITE_OTHER): Payer: Self-pay

## 2022-03-15 ENCOUNTER — Other Ambulatory Visit: Payer: Self-pay | Admitting: Obstetrics & Gynecology

## 2022-03-15 DIAGNOSIS — Z1231 Encounter for screening mammogram for malignant neoplasm of breast: Secondary | ICD-10-CM

## 2022-08-12 ENCOUNTER — Emergency Department (HOSPITAL_BASED_OUTPATIENT_CLINIC_OR_DEPARTMENT_OTHER)
Admission: EM | Admit: 2022-08-12 | Discharge: 2022-08-13 | Disposition: A | Discharge status: Home / Self Care | Payer: Self-pay | Attending: Emergency Medicine | Admitting: Emergency Medicine

## 2022-08-12 ENCOUNTER — Other Ambulatory Visit: Payer: Self-pay

## 2022-08-12 ENCOUNTER — Emergency Department (HOSPITAL_BASED_OUTPATIENT_CLINIC_OR_DEPARTMENT_OTHER): Payer: Self-pay

## 2022-08-12 ENCOUNTER — Encounter (HOSPITAL_BASED_OUTPATIENT_CLINIC_OR_DEPARTMENT_OTHER): Payer: Self-pay

## 2022-08-12 DIAGNOSIS — N10 Acute tubulo-interstitial nephritis: Secondary | ICD-10-CM | POA: Insufficient documentation

## 2022-08-12 DIAGNOSIS — R109 Unspecified abdominal pain: Secondary | ICD-10-CM | POA: Insufficient documentation

## 2022-08-12 DIAGNOSIS — J45909 Unspecified asthma, uncomplicated: Secondary | ICD-10-CM | POA: Insufficient documentation

## 2022-08-12 LAB — COMPREHENSIVE METABOLIC PANEL
ALT: 130 U/L — ABNORMAL HIGH (ref 0–44)
AST: 201 U/L — ABNORMAL HIGH (ref 15–41)
Albumin: 4.3 g/dL (ref 3.5–5.0)
Alkaline Phosphatase: 233 U/L — ABNORMAL HIGH (ref 38–126)
Anion gap: 8 (ref 5–15)
BUN: 8 mg/dL (ref 6–20)
CO2: 27 mmol/L (ref 22–32)
Calcium: 9.5 mg/dL (ref 8.9–10.3)
Chloride: 102 mmol/L (ref 98–111)
Creatinine, Ser: 0.65 mg/dL (ref 0.44–1.00)
GFR, Estimated: >60 mL/min (ref >60)
Glucose, Bld: 119 mg/dL — ABNORMAL HIGH (ref 70–99)
Potassium: 3.4 mmol/L — ABNORMAL LOW (ref 3.5–5.1)
Sodium: 137 mmol/L (ref 135–145)
Total Bilirubin: 1.3 mg/dL — ABNORMAL HIGH (ref 0.3–1.2)
Total Protein: 7.4 g/dL (ref 6.5–8.1)

## 2022-08-12 LAB — URINALYSIS, ROUTINE W REFLEX MICROSCOPIC
Bilirubin Urine: NEGATIVE
Glucose, UA: NEGATIVE mg/dL
Ketones, ur: NEGATIVE mg/dL
Nitrite: NEGATIVE
Protein, ur: 30 mg/dL — AB
Specific Gravity, Urine: 1.015 (ref 1.005–1.030)
WBC, UA: >50 WBC/hpf — ABNORMAL HIGH (ref 0–5)
pH: 6.5 (ref 5.0–8.0)

## 2022-08-12 LAB — CBC
HCT: 37.1 % (ref 36.0–46.0)
Hemoglobin: 12.6 g/dL (ref 12.0–15.0)
MCH: 30.7 pg (ref 26.0–34.0)
MCHC: 34 g/dL (ref 30.0–36.0)
MCV: 90.3 fL (ref 80.0–100.0)
Platelets: 266 10*3/uL (ref 150–400)
RBC: 4.11 MIL/uL (ref 3.87–5.11)
RDW: 13.7 % (ref 11.5–15.5)
WBC: 10.3 10*3/uL (ref 4.0–10.5)
nRBC: 0 % (ref 0.0–0.2)

## 2022-08-12 LAB — LIPASE, BLOOD: Lipase: 15 U/L (ref 11–51)

## 2022-08-12 LAB — PREGNANCY, URINE: Preg Test, Ur: NEGATIVE

## 2022-08-12 MED ORDER — SODIUM CHLORIDE 0.9 % IV BOLUS
1000.0000 mL | Freq: Once | INTRAVENOUS | Status: AC
  Administered 2022-08-12: 1000 mL via INTRAVENOUS

## 2022-08-12 MED ORDER — ACETAMINOPHEN 325 MG PO TABS
650.0000 mg | ORAL_TABLET | Freq: Once | ORAL | Status: AC | PRN
  Administered 2022-08-12: 650 mg via ORAL
  Filled 2022-08-12: qty 2

## 2022-08-12 MED ORDER — FENTANYL CITRATE PF 50 MCG/ML IJ SOSY
50.0000 ug | PREFILLED_SYRINGE | Freq: Once | INTRAMUSCULAR | Status: AC
  Administered 2022-08-12: 50 ug via INTRAVENOUS
  Filled 2022-08-12: qty 1

## 2022-08-12 NOTE — ED Triage Notes (Signed)
Patient here POV from Home.  Endorses Awaking this AM with a Subjective Fever and Right Mid/Lower Back Pain.  History of Pyelonephritis. Some Nausea. No Emesis or Diarrhea.   NAD Noted during Triage. A&OX4. GCS 15. Ambulatory.

## 2022-08-12 NOTE — ED Provider Notes (Signed)
[facility] EMERGENCY DEPT Provider Note  CSN: [id number] Arrival date & time: 08/12/22 2054  Chief Complaint(s) Back Pain  HPI Cathy Nash is a 41 y.o. female {Add pertinent medical, surgical, social history, OB history to HPI:1}   The history is provided by the patient.  Flank Pain This is a new problem. The current episode started yesterday. The problem occurs constantly. Associated symptoms include abdominal pain. Pertinent negatives include no chest pain, no headaches and no shortness of breath. Nothing aggravates the symptoms. Nothing relieves the symptoms. Treatments tried: Naproxen. The treatment provided no relief.    Past Medical History Past Medical History:  Diagnosis Date   Anxiety    Asthma    Hemorrhoid    Pregnancy induced hypertension    previous pregnancy   Preterm labor    1st pregnancy d/t pre-e   Pyelonephritis    June 2013   Vaginal pessary in situ    Varicose veins    Patient Active Problem List   Diagnosis Date Noted   Symptomatic cholelithiasis 09/24/2021   Acute cholecystitis 09/24/2021   History of preterm premature rupture of membranes (PROM) in previous pregnancy 07/11/2016   History of pre-eclampsia  07/11/2016   Language barrier 07/11/2016   Asthma affecting pregnancy, antepartum 06/01/2015   Rectocele, grade 3 03/05/2015   Home Medication(s) Prior to Admission medications   Medication Sig Start Date End Date Taking? Authorizing Provider  Multiple Vitamin (MULTIVITAMIN) tablet Take 1 tablet by mouth daily.    [provider]  omeprazole (PRILOSEC) 20 MG capsule Take 1 capsule (20 mg total) by mouth daily. Patient not taking: Reported on 09/24/2021 07/04/21   Sabas Sous, MD  oxyCODONE (OXY IR/ROXICODONE) 5 MG immediate release tablet Take 1 tablet (5 mg total) by mouth every 6 (six) hours as needed for moderate pain, severe pain or breakthrough pain. 09/24/21   Manus Rudd, MD  sucralfate (CARAFATE) 1  g tablet Take 1 tablet (1 g total) by mouth 4 (four) times daily as needed. Patient not taking: Reported on 09/24/2021 07/04/21   Sabas Sous, MD                                                                                                                                    Allergies Iohexol and Latex  Review of Systems Review of Systems  Constitutional:  Positive for chills.  Respiratory:  Negative for shortness of breath.   Cardiovascular:  Negative for chest pain.  Gastrointestinal:  Positive for abdominal pain and nausea. Negative for diarrhea and vomiting.  Genitourinary:  Positive for dysuria, flank pain and frequency.  Neurological:  Negative for headaches.   As noted in HPI  Physical Exam Vital Signs  I have reviewed the triage vital signs BP 119/72   Pulse (!) 103   Temp (!) 101.5 F (38.6 C) (Oral)   Resp 18   Ht 5\' 2"  (1.575 m)  Wt 67.6 kg   SpO2 98%   BMI 27.26 kg/m   Physical Exam Vitals reviewed.  Constitutional:      General: She is not in acute distress.    Appearance: She is well-developed. She is not diaphoretic.  HENT:     Head: Normocephalic and atraumatic.     Right Ear: External ear normal.     Left Ear: External ear normal.     Nose: Nose normal.  Eyes:     General: No scleral icterus.    Conjunctiva/sclera: Conjunctivae normal.  Neck:     Trachea: Phonation normal.  Cardiovascular:     Rate and Rhythm: Normal rate and regular rhythm.  Pulmonary:     Effort: Pulmonary effort is normal. No respiratory distress.     Breath sounds: No stridor.  Abdominal:     General: There is no distension.     Tenderness: There is abdominal tenderness in the suprapubic area. There is right CVA tenderness. There is no guarding or rebound.  Musculoskeletal:        General: Normal range of motion.     Cervical back: Normal range of motion.  Neurological:     Mental Status: She is alert and oriented to person, place, and time.  Psychiatric:         Behavior: Behavior normal.     ED Results and Treatments Labs (all labs ordered are listed, but only abnormal results are displayed) Labs Reviewed  COMPREHENSIVE METABOLIC PANEL - Abnormal; Notable for the following components:      Result Value   Potassium 3.4 (*)    Glucose, Bld 119 (*)    AST 201 (*)    ALT 130 (*)    Alkaline Phosphatase 233 (*)    Total Bilirubin 1.3 (*)    All other components within normal limits  URINALYSIS, ROUTINE W REFLEX MICROSCOPIC - Abnormal; Notable for the following components:   APPearance HAZY (*)    Hgb urine dipstick LARGE (*)    Protein, ur 30 (*)    Leukocytes,Ua LARGE (*)    WBC, UA >50 (*)    Bacteria, UA RARE (*)    Non Squamous Epithelial 0-5 (*)    All other components within normal limits  LIPASE, BLOOD  CBC  PREGNANCY, URINE                                                                                                                         EKG  EKG Interpretation  Date/Time:    Ventricular Rate:    PR Interval:    QRS Duration:   QT Interval:    QTC Calculation:   R Axis:     Text Interpretation:         Radiology No results found.  Medications Ordered in ED Medications  fentaNYL (SUBLIMAZE) injection 50 mcg (has no administration in time range)  sodium chloride 0.9 % bolus 1,000 mL (has no administration in time range)  acetaminophen (  TYLENOL) tablet 650 mg (650 mg Oral Given 08/12/22 2306)                                                                                                                                     Procedures Procedures  (including critical care time)  Medical Decision Making / ED Course   Medical Decision Making Amount and/or Complexity of Data Reviewed Labs: ordered. Radiology: ordered.  Risk OTC drugs. Prescription drug management.          Final Clinical Impression(s) / ED Diagnoses Final diagnoses:  None    {Document critical care time when appropriate:1}   {Document review of labs and clinical decision tools ie heart score, Chads2Vasc2 etc:1}  {Document your independent review of radiology images, and any outside records:1} {Document your discussion with family members, caretakers, and with consultants:1} {Document social determinants of health affecting pt's care:1} {Document your decision making why or why not admission, treatments were needed:1} This chart was dictated using voice recognition software.  Despite best efforts to proofread,  errors can occur which can change the documentation meaning.

## 2022-08-13 MED ORDER — SODIUM CHLORIDE 0.9 % IV SOLN
1.0000 g | Freq: Once | INTRAVENOUS | Status: AC
  Administered 2022-08-13: 1 g via INTRAVENOUS
  Filled 2022-08-13: qty 10

## 2022-08-13 MED ORDER — ONDANSETRON 4 MG PO TBDP: 4.0000 mg | ORAL_TABLET | Freq: Three times a day (TID) | ORAL | 0 refills | Status: AC | PRN

## 2022-08-13 MED ORDER — CEPHALEXIN 500 MG PO CAPS: 500.0000 mg | ORAL_CAPSULE | Freq: Three times a day (TID) | ORAL | 0 refills | Status: AC

## 2022-08-13 NOTE — ED Notes (Signed)
Pt provided water and crackers. Pt was able to tolerate both without complaint.
